# Patient Record
Sex: Male | Born: 1955 | Race: White | Hispanic: No | Marital: Married | Smoking: Never smoker
Health system: Southern US, Community
[De-identification: ages and names within clinical notes are randomized; demographics above are authoritative.]

## PROBLEM LIST (undated history)

## (undated) DIAGNOSIS — R0602 Shortness of breath: Secondary | ICD-10-CM

## (undated) DIAGNOSIS — R61 Generalized hyperhidrosis: Secondary | ICD-10-CM

## (undated) DIAGNOSIS — R0789 Other chest pain: Secondary | ICD-10-CM

## (undated) DIAGNOSIS — E669 Obesity, unspecified: Secondary | ICD-10-CM

## (undated) DIAGNOSIS — R609 Edema, unspecified: Secondary | ICD-10-CM

## (undated) DIAGNOSIS — I517 Cardiomegaly: Secondary | ICD-10-CM

## (undated) DIAGNOSIS — I10 Essential (primary) hypertension: Secondary | ICD-10-CM

## (undated) DIAGNOSIS — L0390 Cellulitis, unspecified: Secondary | ICD-10-CM

## (undated) DIAGNOSIS — Z87442 Personal history of urinary calculi: Secondary | ICD-10-CM

## (undated) DIAGNOSIS — Z973 Presence of spectacles and contact lenses: Secondary | ICD-10-CM

## (undated) DIAGNOSIS — K219 Gastro-esophageal reflux disease without esophagitis: Secondary | ICD-10-CM

## (undated) DIAGNOSIS — G4730 Sleep apnea, unspecified: Secondary | ICD-10-CM

## (undated) DIAGNOSIS — M1990 Unspecified osteoarthritis, unspecified site: Secondary | ICD-10-CM

## (undated) DIAGNOSIS — T4145XA Adverse effect of unspecified anesthetic, initial encounter: Secondary | ICD-10-CM

## (undated) DIAGNOSIS — T8859XA Other complications of anesthesia, initial encounter: Secondary | ICD-10-CM

## (undated) DIAGNOSIS — J302 Other seasonal allergic rhinitis: Secondary | ICD-10-CM

## (undated) DIAGNOSIS — K635 Polyp of colon: Secondary | ICD-10-CM

## (undated) DIAGNOSIS — K649 Unspecified hemorrhoids: Secondary | ICD-10-CM

## (undated) HISTORY — DX: Gastro-esophageal reflux disease without esophagitis: K21.9

## (undated) HISTORY — PX: COLONOSCOPY W/ BIOPSIES AND POLYPECTOMY: SHX1376

## (undated) HISTORY — DX: Cellulitis, unspecified: L03.90

## (undated) HISTORY — DX: Edema, unspecified: R60.9

## (undated) HISTORY — DX: Generalized hyperhidrosis: R61

## (undated) HISTORY — DX: Shortness of breath: R06.02

## (undated) HISTORY — DX: Cardiomegaly: I51.7

## (undated) HISTORY — DX: Obesity, unspecified: E66.9

## (undated) HISTORY — DX: Other chest pain: R07.89

---

## 1997-10-24 ENCOUNTER — Emergency Department (HOSPITAL_COMMUNITY): Admission: EM | Admit: 1997-10-24 | Discharge: 1997-10-24 | Payer: Self-pay | Admitting: Emergency Medicine

## 1997-12-09 ENCOUNTER — Emergency Department (HOSPITAL_COMMUNITY): Admission: EM | Admit: 1997-12-09 | Discharge: 1997-12-10 | Payer: Self-pay | Admitting: Emergency Medicine

## 1998-08-14 ENCOUNTER — Observation Stay (HOSPITAL_COMMUNITY): Admission: EM | Admit: 1998-08-14 | Discharge: 1998-08-15 | Payer: Self-pay | Admitting: Emergency Medicine

## 1998-08-14 ENCOUNTER — Encounter: Payer: Self-pay | Admitting: Emergency Medicine

## 1999-06-10 ENCOUNTER — Emergency Department (HOSPITAL_COMMUNITY): Admission: EM | Admit: 1999-06-10 | Discharge: 1999-06-11 | Payer: Self-pay | Admitting: Emergency Medicine

## 2000-05-22 ENCOUNTER — Encounter: Payer: Self-pay | Admitting: Internal Medicine

## 2000-05-22 ENCOUNTER — Emergency Department (HOSPITAL_COMMUNITY): Admission: EM | Admit: 2000-05-22 | Discharge: 2000-05-22 | Payer: Self-pay | Admitting: Internal Medicine

## 2002-05-10 HISTORY — PX: CARPAL TUNNEL RELEASE: SHX101

## 2002-11-07 ENCOUNTER — Inpatient Hospital Stay (HOSPITAL_COMMUNITY): Admission: EM | Admit: 2002-11-07 | Discharge: 2002-11-08 | Payer: Self-pay | Admitting: Emergency Medicine

## 2002-11-07 ENCOUNTER — Encounter: Payer: Self-pay | Admitting: Emergency Medicine

## 2003-10-31 ENCOUNTER — Emergency Department (HOSPITAL_COMMUNITY): Admission: EM | Admit: 2003-10-31 | Discharge: 2003-10-31 | Payer: Self-pay | Admitting: Emergency Medicine

## 2003-11-30 ENCOUNTER — Emergency Department (HOSPITAL_COMMUNITY): Admission: EM | Admit: 2003-11-30 | Discharge: 2003-11-30 | Payer: Self-pay | Admitting: Emergency Medicine

## 2003-12-03 ENCOUNTER — Emergency Department (HOSPITAL_COMMUNITY): Admission: EM | Admit: 2003-12-03 | Discharge: 2003-12-03 | Payer: Self-pay | Admitting: Emergency Medicine

## 2004-05-01 ENCOUNTER — Emergency Department (HOSPITAL_COMMUNITY): Admission: EM | Admit: 2004-05-01 | Discharge: 2004-05-01 | Payer: Self-pay | Admitting: Emergency Medicine

## 2004-09-22 ENCOUNTER — Emergency Department (HOSPITAL_COMMUNITY): Admission: EM | Admit: 2004-09-22 | Discharge: 2004-09-22 | Payer: Self-pay | Admitting: Family Medicine

## 2004-09-23 ENCOUNTER — Encounter: Admission: RE | Admit: 2004-09-23 | Discharge: 2004-09-23 | Payer: Self-pay | Admitting: *Deleted

## 2004-09-29 ENCOUNTER — Emergency Department (HOSPITAL_COMMUNITY): Admission: EM | Admit: 2004-09-29 | Discharge: 2004-09-29 | Payer: Self-pay | Admitting: Family Medicine

## 2004-10-06 ENCOUNTER — Emergency Department (HOSPITAL_COMMUNITY): Admission: EM | Admit: 2004-10-06 | Discharge: 2004-10-06 | Payer: Self-pay | Admitting: Family Medicine

## 2005-03-20 ENCOUNTER — Emergency Department (HOSPITAL_COMMUNITY): Admission: EM | Admit: 2005-03-20 | Discharge: 2005-03-20 | Payer: Self-pay | Admitting: Family Medicine

## 2006-01-12 IMAGING — CR DG FOOT COMPLETE 3+V*L*
3 series · 3 of 3 positions shown · non-contrast
Comparison: none

CLINICAL DATA: 48-year-old male, left foot pain with medial bruising and swelling.
 LEFT FOOT, THREE VIEWS

[view not recorded (1 of 3)]
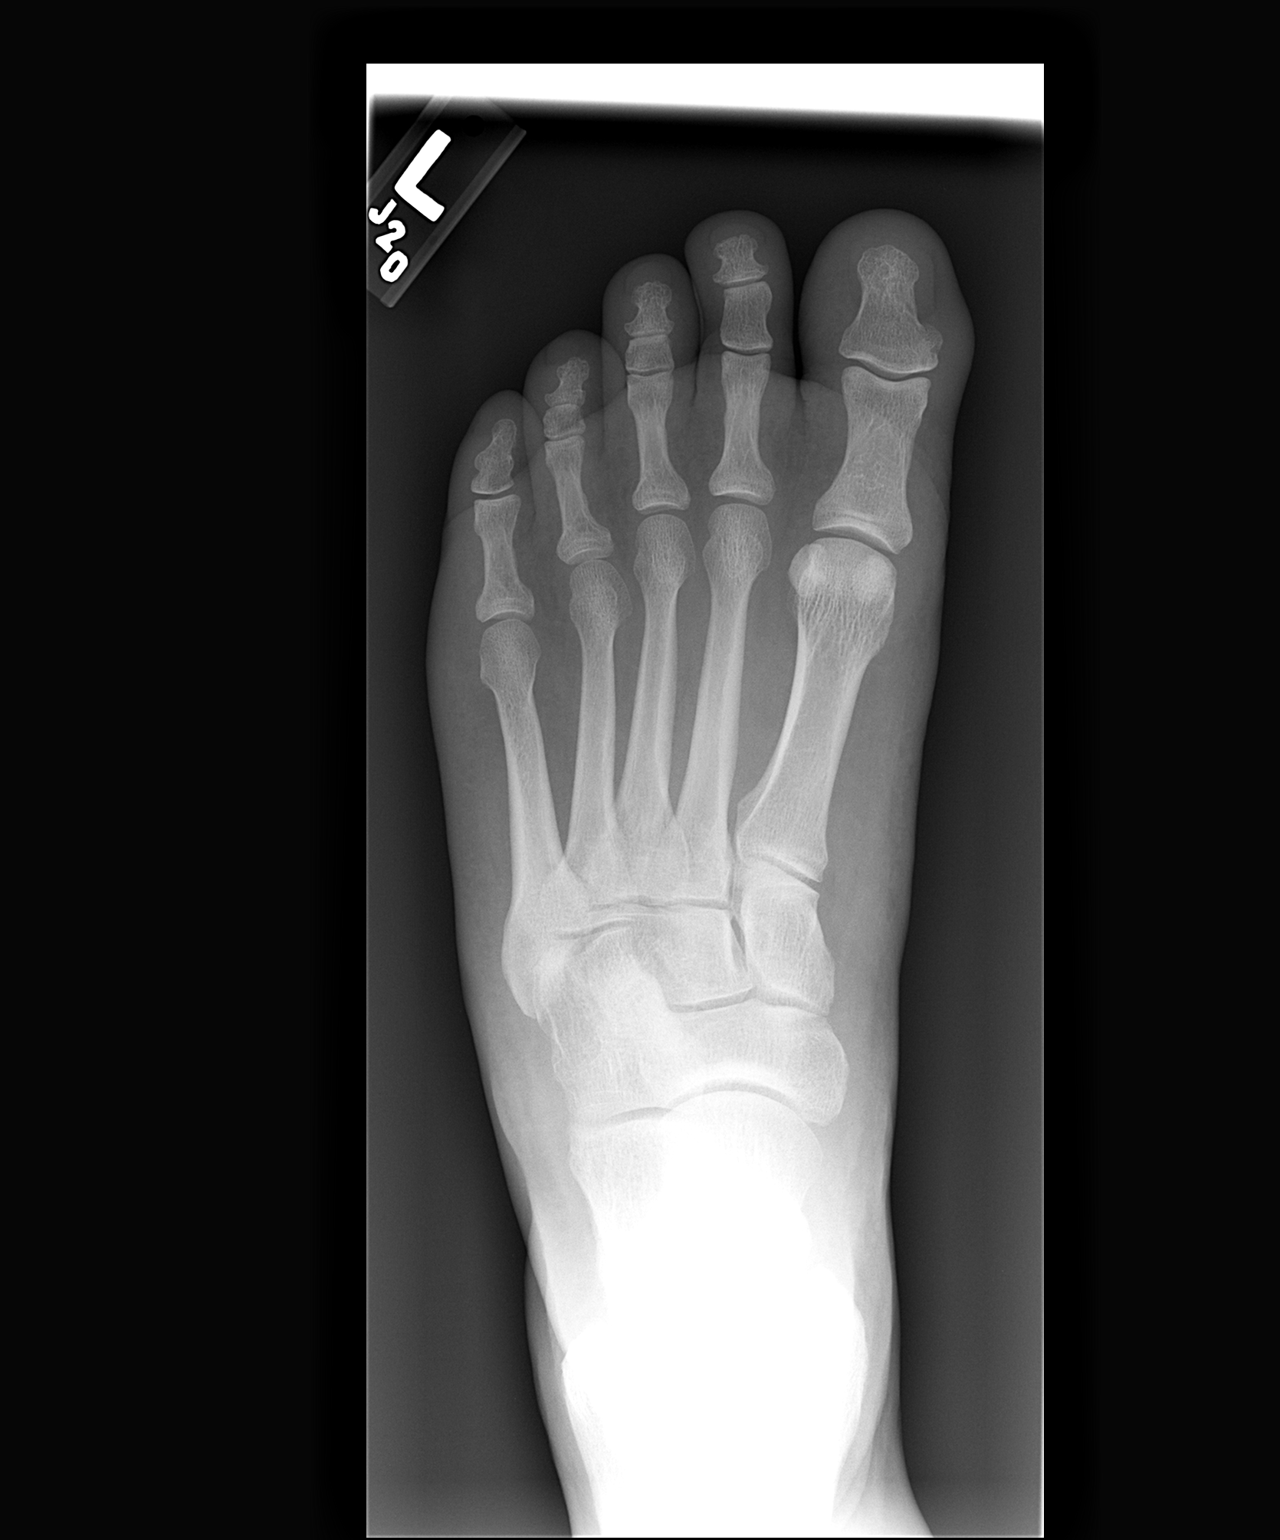

[view not recorded (2 of 3)]
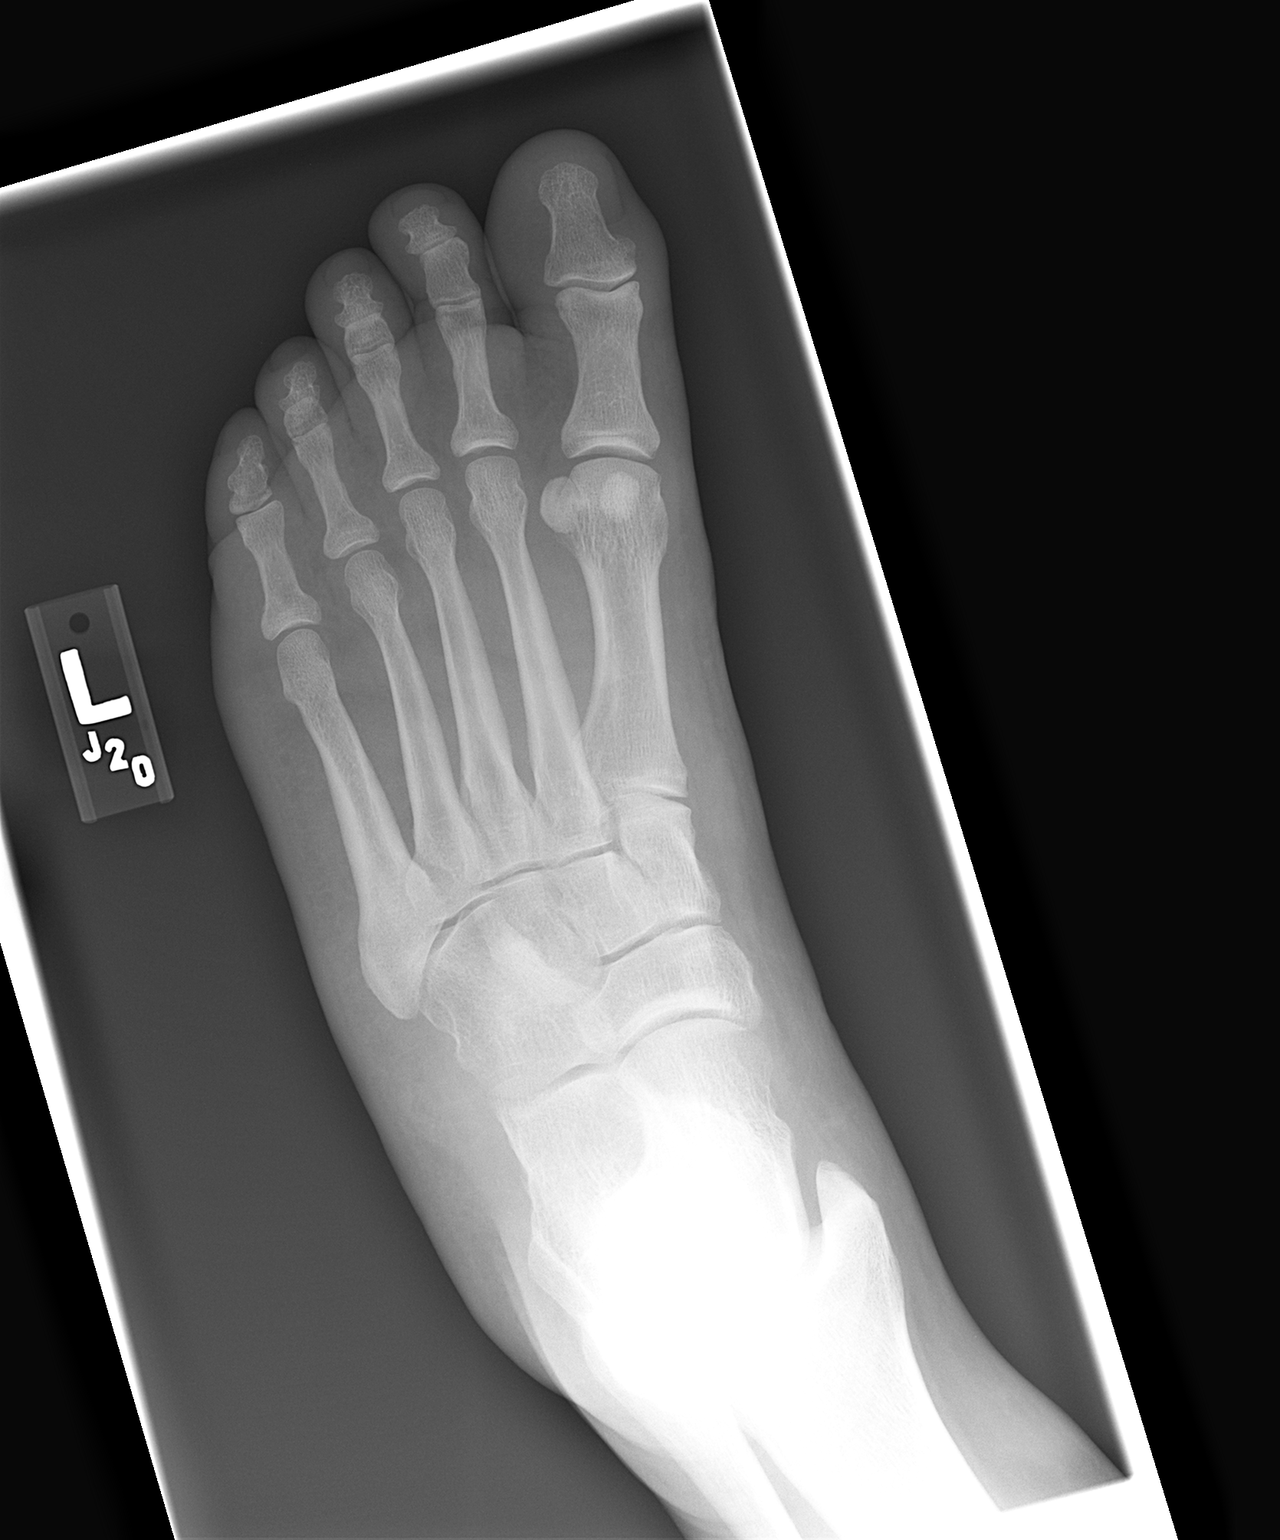

[view not recorded (3 of 3)]
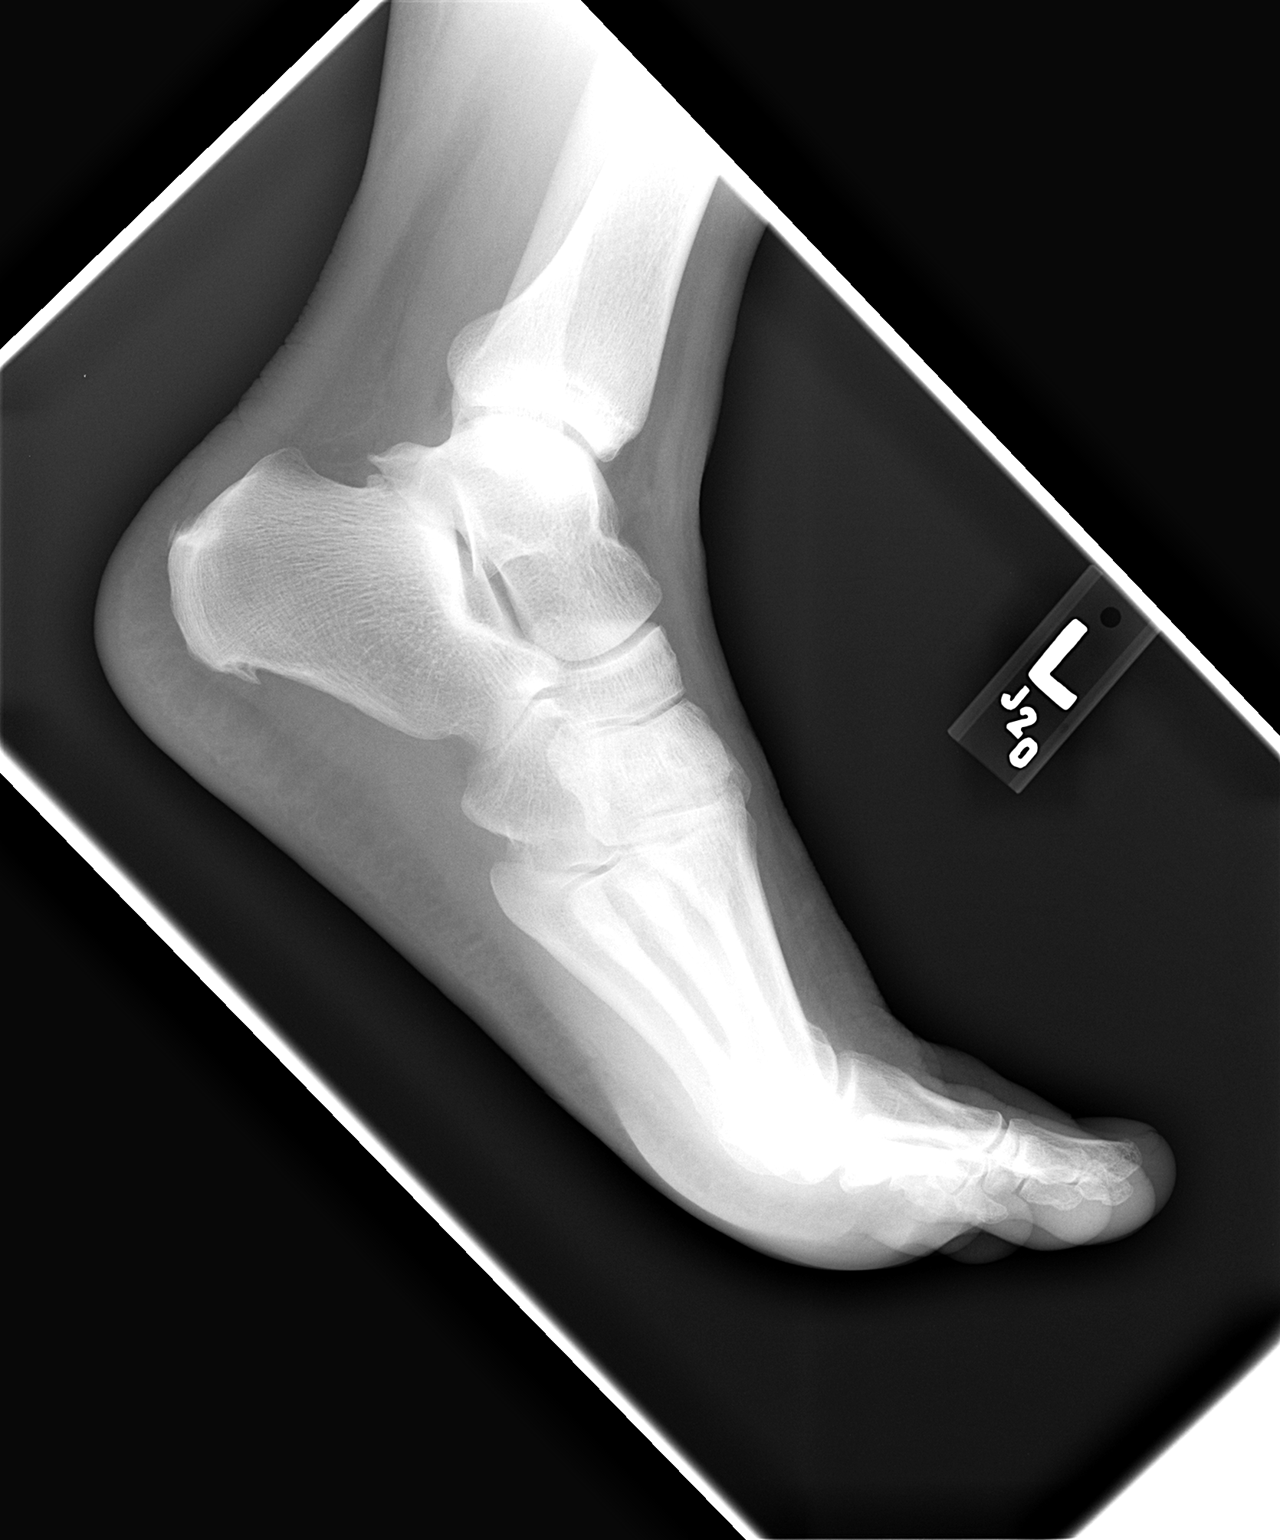

[3 of 3 positions shown; findings below may reference images not displayed]

FINDINGS: No acute radiolucent fracture or displacement.  No significant soft tissue swelling by plain radiography.  Alignment is anatomic.  Plantar calcaneal spurring is noted.  
 IMPRESSION
 No acute finding by plain radiography.

## 2008-10-23 ENCOUNTER — Emergency Department (HOSPITAL_COMMUNITY): Admission: EM | Admit: 2008-10-23 | Discharge: 2008-10-23 | Payer: Self-pay | Admitting: Emergency Medicine

## 2008-12-18 ENCOUNTER — Encounter: Payer: Self-pay | Admitting: Cardiovascular Disease

## 2010-05-10 HISTORY — PX: TRIGGER FINGER RELEASE: SHX641

## 2010-05-10 HISTORY — PX: KNEE ARTHROSCOPY: SHX127

## 2010-08-17 LAB — DIFFERENTIAL
Basophils Absolute: 0 10*3/uL (ref 0.0–0.1)
Eosinophils Absolute: 0.1 10*3/uL (ref 0.0–0.7)
Monocytes Absolute: 1.3 10*3/uL — ABNORMAL HIGH (ref 0.1–1.0)
Monocytes Relative: 16 % — ABNORMAL HIGH (ref 3–12)
Neutro Abs: 4.9 10*3/uL (ref 1.7–7.7)
Neutrophils Relative %: 61 % (ref 43–77)

## 2010-08-17 LAB — URINALYSIS, ROUTINE W REFLEX MICROSCOPIC
Bilirubin Urine: NEGATIVE
Hgb urine dipstick: NEGATIVE
Nitrite: NEGATIVE
Protein, ur: NEGATIVE mg/dL
Specific Gravity, Urine: 1.01 (ref 1.005–1.030)
pH: 6.5 (ref 5.0–8.0)

## 2010-08-17 LAB — CBC
HCT: 42.6 % (ref 39.0–52.0)
MCV: 84 fL (ref 78.0–100.0)
Platelets: 299 10*3/uL (ref 150–400)
RBC: 5.07 MIL/uL (ref 4.22–5.81)
RDW: 13.8 % (ref 11.5–15.5)

## 2010-08-17 LAB — COMPREHENSIVE METABOLIC PANEL
Albumin: 3.2 g/dL — ABNORMAL LOW (ref 3.5–5.2)
BUN: 7 mg/dL (ref 6–23)
GFR calc Af Amer: >60 mL/min (ref >60)
GFR calc non Af Amer: >60 mL/min (ref >60)
Glucose, Bld: 91 mg/dL (ref 70–99)

## 2010-08-17 LAB — POCT CARDIAC MARKERS
Myoglobin, poc: 83.7 ng/mL (ref 12–200)
Troponin i, poc: <0.05 ng/mL (ref 0.00–0.09)

## 2010-09-25 NOTE — H&P (Signed)
NAME:  Eddie Fletcher, Eddie Fletcher NO.:  [id number]   MEDICAL RECORD NO.:  [id number]                   PATIENT TYPE:  INP   LOCATION:  0102                                 FACILITY:  [facility]   PHYSICIAN:  Loretha Brasil. Lia Foyer, M.D.             DATE OF BIRTH:  1955-09-07   DATE OF ADMISSION:  11/07/2002  DATE OF DISCHARGE:                                HISTORY & PHYSICAL   CHIEF COMPLAINT:  Chest pain.   HISTORY OF PRESENT ILLNESS:  The patient is a pleasant 55 year old gentleman  who presented to the emergency room.  He had some epigastric discomfort that  awoken him from a sound sleep and it was about a 10/10.  He had something  similar to this a number of years ago and ended up having a Cardiolite  study, which according to the patient was unremarkable.  He was found at  that point to have a fatty liver and was seeing Dr. Evie Lacks. Plotnikov at  that time.   He went to work today but the pain persisted.  There was some mild  diaphoresis, dizziness, as well as some mild dyspnea.  He took some Pepto-  Bismol at the emergency room and received nitroglycerin x 2 with Morphine  sulfate with easing of the pain, although he is still having a mild degree.  The first set of enzymes in the emergency room were negative.  The patient  does have a history of gastroesophageal reflux disease and has been on  Aciphex since 1997 and he ran out on Monday on November 05, 2002 and he took  Excedrin last night for pain in the right temple.   PAST MEDICAL HISTORY:  1. Gastroesophageal reflux disease.  2. Asthmatic bronchitis.  3. Obesity.   ALLERGIES:  No known drug allergies.   MEDICATIONS:  1. Aciphex 20 mg q.d.  2. Albuterol MDI 2 puffs q.6h.  3. Advair discontinued secondary to hoarseness.   PAST SURGICAL HISTORY:  The patient is status post EGD and colonoscopy in  1997.  He is status post bilateral carpal tunnel release one and a half  years ago.   FAMILY HISTORY:  The  patient's father is currently 43 years old and in good  health.  His mother is an alcoholic and has alcohol related problems.   SOCIAL HISTORY:  The patient is married x 15 years and has two children 15  and 10 who are both well.  He works at [employer] and he has a very  vigorous busy job.  He does have some vapor exposure.  The patient does not  smoke and has never smoked and he gets secondary exposure.   REVIEW OF SYMPTOMS:  Essentially unremarkable.   PHYSICAL EXAMINATION:  GENERAL:  He is an alert and oriented male in no  acute distress.  VITAL SIGNS:  Blood pressure 129/88, pulse 83, respiratory rate 20,  temperature 97.6.  HEENT:  Unremarkable.  Pupils are equal and reactive to light and  accomodation.  NECK:  The carotid upstrokes are brisk but no carotid bruits.  LUNGS:  Clear to auscultation and percussion.  CARDIOVASCULAR:  Regular without an obvious murmur, although heart sounds  were quite distant.  ABDOMEN:  Obese and nontender with a protuberant abdomen but no obvious  hepatosplenomegaly.  EXTREMITIES:  No clubbing, cyanosis, or edema.  He had good intact pulses.   LABORATORY DATA:  The electrocardiogram demonstrates normal sinus rhythm,  essentially within normal limits.  Chest x-ray is pending.   Laboratory data includes CK total of 137 and a CK-MB of 2.9.  Troponin is  0.2.  Liver function studies reveal an SGPT that is minimally elevated at  43.  Total protein and albumin are normal.  Glucose is 109, BUN 16,  creatinine 0.8.  Potassium 3.7.  White count was 7200, hemoglobin 14.8 gm/dL  and hematocrit 43.4.   IMPRESSION:  1. Recurrent substernal epigastric discomfort following discontinuation of     Aciphex.  2. Cannot exclude unstable angina, although not likely.   RECOMMENDATIONS:  1. Resume proton pump inhibitor.  2. Serial CKs, troponins and EKGs.  3. If EKG and CPKs are negative, then would recommend exercise stress     testing.  If positive,  would recommend consideration for cardiac     catheterization.                                               Loretha Brasil. Lia Foyer, M.D.    TDS/MEDQ  D:  11/07/2002  T:  11/07/2002  Job:  [id number]   cc:   Juanita Craver, M.D.  [address]  [place]  [place] [id number]  Fax: [phone]    cc:   Juanita Craver, M.D.  [address]  [place]  [place] [address]  Fax: [phone]

## 2010-09-25 NOTE — H&P (Signed)
NAME:  Eddie Fletcher, Eddie Fletcher NO.:  [id number]   MEDICAL RECORD NO.:  [id number]                   PATIENT TYPE:  EMS   LOCATION:  ED                                   FACILITY:  [facility]   PHYSICIAN:  Eddie Fletcher, M.D.         DATE OF BIRTH:  04/20/1956   DATE OF ADMISSION:  11/07/2002  DATE OF DISCHARGE:                                HISTORY & PHYSICAL   PROBLEM LIST:  1. Chest pain, rule out myocardial infarction.     a. History of negative stress Cardiolite in 1997 at the [facility] office.  2. Gastroesophageal reflux disease associated with possible esophageal     spasms.  3. Probable acute gastritis in the setting of non-steroidal anti-     inflammatory drug use.  4. Obesity.  5. History of asthma.  6. History of seasonal rhinitis.  7. History of alcohol abuse, quit about three years ago.   CHIEF COMPLAINT:  Chest pain.   HISTORY OF PRESENT ILLNESS:  Mr. Eddie Fletcher is a pleasant 55 year old  gentleman followed at the [facility] by Dr. Tollie Fletcher.  The  patient presents to the emergency department with substernal chest  tightness.  He woke up about 4 a.m. with epigastric lower chest burning  consistent with his previous GERD symptoms.  The patient had run out of  Aciphex a few days back.  The symptoms persisted for about five hours until  he made it to the ER where the symptoms disappeared after the second  sublingual nitroglycerin.  For this reason, the patient was prepared for  admission for cardiac rule out MI.  Mr. Eddie Fletcher describes similar symptoms  as this morning though not as severe about two days while he was driving his  car.  Symptoms lasted for about 10 minutes.  He denies shortness of breath,  radiation of these chest symptoms, nausea, vomiting, and no palpitations.  No anginal symptoms.  No lower extremity swelling.  No wheezing.  No  postnasal drip.  No shortness of breath.  No headache.  No  syncope.  No  bleeding symptoms.  No nausea.  No fever, chills, nausea, vomiting,  diarrhea, constipation.  The patient describes slightly increased stress at  home.  No GU symptoms.   PAST MEDICAL HISTORY:  1. History of negative stress Cardiolite in 1997 at the [facility]     office.  2. Gastroesophageal reflux disease associated with possible esophageal     spasms.  3. Probable acute gastritis in the setting of non-steroidal anti-     inflammatory drug use.  4. Obesity.  5. History of asthma.  6. History of seasonal rhinitis.  7. History of alcohol abuse, quit about three years ago.   ALLERGIES:  None.   CURRENT MEDICATIONS:  1. Albuterol MDI p.r.n.  2. Advair, he quit last week due to sore throat.  3. Excedrin p.r.n.  4. Aciphex which he ran out of a few days ago.   SOCIAL HISTORY:  The patient is married and has three children.  He works in  Clinical cytogeneticist.  He has heavy second-hand smoking at  home.  He quit drinking about three years ago.   FAMILY HISTORY:  The patient's grandfather had hypertension and died at age  of 54, perhaps of stroke versus heart attack.  The patient's aunt had  leukemia.  No diabetes in the family.   REVIEW OF SYSTEMS:  CARDIORESPIRATORY:  He denies shortness of breath,  radiation of these chest symptoms, nausea, vomiting, and no palpitations.  No anginal symptoms.  No wheezing.  No shortness of breath.  EXTREMITIES:  No lower extremity swelling.  HEENT:  No postnasal drip.  GENERAL:  No  headache.  No syncope.  No bleeding symptoms.  No fever, chills.  GI:  No  nausea, vomiting, diarrhea, constipation.  GU:  No GU symptoms.  PSYCH:  The  patient describes slightly increased stress at home.   PHYSICAL EXAMINATION:  VITAL SIGNS:  Temperature 97.6, blood pressure  129/88, heart rate 23, respiratory rate 20, O2 saturation 96% on room air.  HEENT:  Normocephalic, atraumatic.  Nonicteric sclerae, conjunctivae within   normal limits.  PERRLA, EOMI.  Funduscopic exam negative for papilledema,  hemorrhages.  Moist mucous membranes.  TMs within normal limits.  Oropharynx  clear.  NECK:  Supple.  No JVD, bruits, adenopathy, or thyromegaly.  LUNGS:  Clear to auscultation bilaterally without crackles or wheezes.  CARDIAC:  Regular rhythm and rate without murmurs, rubs, or gallops.  Normal  S1and S2.  ABDOMEN:  Obese, there is a mild tenderness at the epigastrium.  No rebound.  Bowel sounds were present.  No hepatosplenomegaly.  No guarding or bruits.  No masses.  GU:  Within normal limits.  RECTAL:  Empty vault, normal sphincter tone.  Normal prostatic size.  EXTREMITIES:  No edema, clubbing, or cyanosis.  Pulses 2+ bilaterally.  NEURO:  Alert and oriented x3.  Strength 5/5 in all extremities.  DTRs 3/5  in lower extremities.  Cranial nerves II-XII intact.  Sensory intact.  Plantar reflexes downgoing bilaterally.   EKG:  Normal sinus rhythm without evidence of T-waves or ST segment changes.  No T-wave abnormality.   CHEST X-RAY:  Consistent with some chronic scarring of the right base,  otherwise negative.   LABORATORY DATA:  CK 137, MB 2.9, troponin I 0.02.  The rest of the lab  data, which includes CMP and CBC, are within normal limits.   ASSESSMENT AND PLAN:  Problem 1.  Chest pain, rule out myocardial  infarction.  Symptoms that the patient presents with are not classic of  angina symptoms.  The patient's cardiac risk factors are obesity and second-  hand smoking.  It is unclear if his grandfather died of acute myocardial  infarction at age of 54.  The lipid studies are normal.  Currently the  patient is chest pain free.  The EKG and cardiac enzymes are unremarkable.  Our plan is to admit the patient to a telemetry bed.  Serial cardiac enzymes  will be obtained.  Will provide with oxygen.  There is no evidence of bronchospasm.  There is no evidence of hypoxia or lower extremity edema.  Will use  aspirin and a beta blocker for now.  Will use as needed  nitroglycerin and if the chest pain returns, IV nitroglycerin drip could be  considered.  Will obtain Cardiology consult from [place].  Lipid profile  will be obtained.  Problem 2.  Gastroesophageal reflux disease with possible esophageal spasms.  Given the history of presentation, and the strong/severe history of  gastroesophageal reflux, it is possible that this patient is having an  exacerbation of esophagitis with esophageal spasms.  Mr. Eddie Fletcher was not  taking his proton pump inhibitor as described in the history of present  illness section.  Esophageal spasms can respond to the treatment with  calcium channel blockers and nitrates.  Will be monitoring for further  symptoms.  For now will use a proton pump inhibitor twice a day and  antireflux maneuvers.  For now will hold on the use of nitrates and calcium  channel blockers unless the symptoms return.  An esophagogastroduodenoscopy  may be helpful as an outpatient in the near future.  Problem 3.  Acute gastritis.  The patient was taking regularly non-steroidal  anti-inflammatory drugs.  There is mild epigastric tenderness.  I suspect  gastritis as described above.  I will use Protonix and Carafate for now.  As  described in problem #2, an esophagogastroduodenoscopy may be helpful in the  near future.  Will defer this procedure to be done as an outpatient unless  the symptoms were to get worse during this brief observation  status/admission.  Problem 4.  Obesity.  Will use a low-fat diet.  Will check a TSH and a lipid  profile.                                               Eddie Fletcher, M.D.    JM/MEDQ  D:  11/07/2002  T:  11/07/2002  Job:  [id number]   cc:   [facility]   Juanita Craver, M.D.  [address]  [place]  [place] [address]  Fax: [phone]

## 2010-09-29 ENCOUNTER — Other Ambulatory Visit: Payer: Self-pay | Admitting: Family Medicine

## 2010-09-29 DIAGNOSIS — M25561 Pain in right knee: Secondary | ICD-10-CM

## 2010-10-04 ENCOUNTER — Ambulatory Visit
Admission: RE | Admit: 2010-10-04 | Discharge: 2010-10-04 | Disposition: A | Discharge status: Home / Self Care | Payer: Medicare FFS | Source: Ambulatory Visit | Attending: Family Medicine | Admitting: Family Medicine

## 2010-10-04 DIAGNOSIS — M25561 Pain in right knee: Secondary | ICD-10-CM

## 2010-11-17 ENCOUNTER — Other Ambulatory Visit (HOSPITAL_COMMUNITY): Payer: Self-pay | Admitting: Orthopedic Surgery

## 2010-11-17 ENCOUNTER — Encounter (HOSPITAL_COMMUNITY)
Admission: RE | Admit: 2010-11-17 | Discharge: 2010-11-17 | Disposition: A | Discharge status: Home / Self Care | Payer: Managed Care, Other (non HMO) | Source: Ambulatory Visit | Attending: Orthopedic Surgery | Admitting: Orthopedic Surgery

## 2010-11-17 DIAGNOSIS — S8990XA Unspecified injury of unspecified lower leg, initial encounter: Secondary | ICD-10-CM

## 2010-11-17 LAB — BASIC METABOLIC PANEL
BUN: 13 mg/dL (ref 6–23)
CO2: 31 mEq/L (ref 19–32)
Calcium: 9 mg/dL (ref 8.4–10.5)
Chloride: 102 mEq/L (ref 96–112)
Creatinine, Ser: 0.75 mg/dL (ref 0.50–1.35)
Potassium: 4.3 mEq/L (ref 3.5–5.1)
Sodium: 141 mEq/L (ref 135–145)

## 2010-11-17 LAB — SURGICAL PCR SCREEN: MRSA, PCR: NEGATIVE

## 2010-11-17 LAB — CBC
HCT: 42.1 % (ref 39.0–52.0)
MCV: 84.5 fL (ref 78.0–100.0)
RDW: 14 % (ref 11.5–15.5)

## 2010-11-17 LAB — PROTIME-INR: INR: 1.09 (ref 0.00–1.49)

## 2010-11-18 ENCOUNTER — Ambulatory Visit (HOSPITAL_COMMUNITY)
Admission: RE | Admit: 2010-11-18 | Discharge: 2010-11-18 | Disposition: A | Discharge status: Home / Self Care | Payer: Managed Care, Other (non HMO) | Source: Ambulatory Visit | Attending: Orthopedic Surgery | Admitting: Orthopedic Surgery

## 2010-11-18 DIAGNOSIS — I10 Essential (primary) hypertension: Secondary | ICD-10-CM | POA: Insufficient documentation

## 2010-11-18 DIAGNOSIS — IMO0002 Reserved for concepts with insufficient information to code with codable children: Secondary | ICD-10-CM | POA: Insufficient documentation

## 2010-11-18 DIAGNOSIS — Z01818 Encounter for other preprocedural examination: Secondary | ICD-10-CM | POA: Insufficient documentation

## 2010-11-18 DIAGNOSIS — Z01812 Encounter for preprocedural laboratory examination: Secondary | ICD-10-CM | POA: Insufficient documentation

## 2010-11-18 DIAGNOSIS — Z01810 Encounter for preprocedural cardiovascular examination: Secondary | ICD-10-CM | POA: Insufficient documentation

## 2010-11-18 DIAGNOSIS — E669 Obesity, unspecified: Secondary | ICD-10-CM | POA: Insufficient documentation

## 2010-11-18 DIAGNOSIS — X58XXXA Exposure to other specified factors, initial encounter: Secondary | ICD-10-CM | POA: Insufficient documentation

## 2010-11-18 DIAGNOSIS — M6580 Other synovitis and tenosynovitis, unspecified site: Secondary | ICD-10-CM | POA: Insufficient documentation

## 2010-11-18 DIAGNOSIS — M1710 Unilateral primary osteoarthritis, unspecified knee: Secondary | ICD-10-CM | POA: Insufficient documentation

## 2010-11-18 DIAGNOSIS — G4733 Obstructive sleep apnea (adult) (pediatric): Secondary | ICD-10-CM | POA: Insufficient documentation

## 2010-12-09 NOTE — Op Note (Signed)
Eddie Fletcher, Eddie Fletcher NO.:  [id number]  MEDICAL RECORD NO.:  [id number]  LOCATION:  SDSC                         FACILITY:  [facility]  PHYSICIAN:  Newt Minion, MD     DATE OF BIRTH:  [birth year]  DATE OF PROCEDURE:  11/18/2010 DATE OF DISCHARGE:  11/18/2010                              OPERATIVE REPORT   PREOPERATIVE DIAGNOSIS:  Osteoarthritis and meniscal tear, right knee.  POSTOPERATIVE DIAGNOSES: 1. Osteochondral defect, medial femoral condyle, lateral femoral     condyle, patella and trochlea. 2. Medial and lateral meniscal tears. 3. Loose body greater than 10 mm in diameter.  PROCEDURE: 1. Right knee arthroscopy. 2. Partial medial lateral meniscectomy. 3. Abrasion chondroplasty, medial femoral condyle, lateral femoral     condyle, patella, and trochlea. 4. Removal of loose body greater than 10 mm in diameter.  SURGEON:  Newt Minion, MD  ANESTHESIA:  General plus knee block.  ESTIMATED BLOOD LOSS:  Minimal.  ANTIBIOTICS:  Kefzol 2 grams.  DRAINS:  None.  COMPLICATIONS:  None.  DISPOSITION:  To PACU in stable condition.  INDICATION FOR PROCEDURE:  The patient is a 55 year old gentleman who has mechanical symptoms of his right knee.  He has failed conservative care, has pain with activities of daily living and presents at this time for surgical intervention.  Risks and benefits of surgery were discussed including infection, neurovascular injury, persistent pain, need for additional surgery.  The patient states he understands and wished to proceed at this time.  DESCRIPTION OF PROCEDURE:  The patient was brought to OR room 10 and underwent a general anesthetic after knee block.  After adequate level of anesthesia was obtained, the patient's right lower extremity was prepped using DuraPrep and draped into a sterile field.  The scope was inserted through the inferolateral portal and inferior medial working portal was established.   Visualization showed a large osteochondral defect of the medial femoral condyle.  This was dbrided back to bleeding viable petechial bone.  He had a large posterior horn of the medial meniscal tear.  This was dbrided.  Examination of notch showed an intact ACL.  Examination of lateral joint line in the figure of four position showed a osteochondral defect of the lateral femoral condyle which was debrided as well as a large degenerative tear of the lateral meniscus.  This was also dbrided.  There was a large loose body in the lateral joint line which measured 10 mm in diameter and this was removed without complications.  Examination of the patellofemoral joint showed large osteochondral defect of patellofemoral joint.  This was dbrided. There was a plica which was dbrided and synovitis which was dbrided. There were no loose bodies in the lateral gutter.  Survey of all compartments again showed no further loose bodies.  The instruments were removed.  The joint was infused with a total of 20 mL of 0.5% Marcaine plain.  The wound was covered with a Adaptic, 4x4s, Webril.  The patient had significant venous stasis edema and dermatitis in the right lower extremity.  Unna paste, Kerlix, and a Coban wrap was applied to control the venous stasis edema.  Plan to follow up in the  office in 1 week.     Newt Minion, MD     MVD/MEDQ  D:  11/18/2010  T:  11/18/2010  Job:  [id number]  Electronically Signed by Meridee Score MD on 12/09/2010 06:27:19 AM

## 2014-01-16 ENCOUNTER — Other Ambulatory Visit (HOSPITAL_COMMUNITY): Payer: Self-pay | Admitting: Orthopedic Surgery

## 2014-01-23 ENCOUNTER — Encounter (HOSPITAL_COMMUNITY)
Admission: RE | Admit: 2014-01-23 | Discharge: 2014-01-23 | Disposition: A | Discharge status: Home / Self Care | Payer: Managed Care, Other (non HMO) | Source: Ambulatory Visit | Attending: Orthopedic Surgery | Admitting: Orthopedic Surgery

## 2014-01-23 ENCOUNTER — Encounter (HOSPITAL_COMMUNITY): Payer: Self-pay

## 2014-01-23 ENCOUNTER — Ambulatory Visit (HOSPITAL_COMMUNITY)
Admission: RE | Admit: 2014-01-23 | Discharge: 2014-01-23 | Disposition: A | Discharge status: Home / Self Care | Payer: Managed Care, Other (non HMO) | Source: Ambulatory Visit | Attending: Orthopedic Surgery | Admitting: Orthopedic Surgery

## 2014-01-23 DIAGNOSIS — Z01818 Encounter for other preprocedural examination: Secondary | ICD-10-CM | POA: Insufficient documentation

## 2014-01-23 DIAGNOSIS — G4730 Sleep apnea, unspecified: Secondary | ICD-10-CM | POA: Insufficient documentation

## 2014-01-23 DIAGNOSIS — I10 Essential (primary) hypertension: Secondary | ICD-10-CM | POA: Diagnosis not present

## 2014-01-23 DIAGNOSIS — E669 Obesity, unspecified: Secondary | ICD-10-CM | POA: Diagnosis not present

## 2014-01-23 HISTORY — DX: Personal history of urinary calculi: Z87.442

## 2014-01-23 HISTORY — DX: Unspecified osteoarthritis, unspecified site: M19.90

## 2014-01-23 HISTORY — DX: Essential (primary) hypertension: I10

## 2014-01-23 HISTORY — DX: Sleep apnea, unspecified: G47.30

## 2014-01-23 LAB — CBC
HCT: 36.6 % — ABNORMAL LOW (ref 39.0–52.0)
Hemoglobin: 12.4 g/dL — ABNORMAL LOW (ref 13.0–17.0)
MCH: 29.2 pg (ref 26.0–34.0)
MCHC: 33.9 g/dL (ref 30.0–36.0)
MCV: 86.1 fL (ref 78.0–100.0)
Platelets: 216 10*3/uL (ref 150–400)
RBC: 4.25 MIL/uL (ref 4.22–5.81)
RDW: 13.9 % (ref 11.5–15.5)
WBC: 7.2 10*3/uL (ref 4.0–10.5)

## 2014-01-23 LAB — COMPREHENSIVE METABOLIC PANEL
ALT: 22 U/L (ref 0–53)
AST: 20 U/L (ref 0–37)
Albumin: 3.1 g/dL — ABNORMAL LOW (ref 3.5–5.2)
Alkaline Phosphatase: 118 U/L — ABNORMAL HIGH (ref 39–117)
Anion gap: 9 (ref 5–15)
BUN: 23 mg/dL (ref 6–23)
CO2: 28 meq/L (ref 19–32)
CREATININE: 1.09 mg/dL (ref 0.50–1.35)
Calcium: 9 mg/dL (ref 8.4–10.5)
Chloride: 97 mEq/L (ref 96–112)
GFR calc Af Amer: 85 mL/min — ABNORMAL LOW (ref >90)
GFR, EST NON AFRICAN AMERICAN: 74 mL/min — AB (ref >90)
GLUCOSE: 99 mg/dL (ref 70–99)
Potassium: 4.1 mEq/L (ref 3.7–5.3)
SODIUM: 134 meq/L — AB (ref 137–147)
Total Bilirubin: 0.4 mg/dL (ref 0.3–1.2)
Total Protein: 8.3 g/dL (ref 6.0–8.3)

## 2014-01-23 LAB — TYPE AND SCREEN
ABO/RH(D): O POS
Antibody Screen: NEGATIVE

## 2014-01-23 LAB — PROTIME-INR
INR: 1.07 (ref 0.00–1.49)
Prothrombin Time: 13.9 seconds (ref 11.6–15.2)

## 2014-01-23 LAB — ABO/RH: ABO/RH(D): O POS

## 2014-01-23 LAB — APTT: aPTT: 29 seconds (ref 24–37)

## 2014-01-23 LAB — SURGICAL PCR SCREEN
MRSA, PCR: NEGATIVE
Staphylococcus aureus: POSITIVE — AB

## 2014-01-23 NOTE — Pre-Procedure Instructions (Addendum)
Eddie Fletcher  01/23/2014   Your procedure is scheduled on:  01/30/14  Report to [facility] short stay admitting at 75 AM.  Call this number if you have problems the morning of surgery: [phone]   Remember:   Do not eat food or drink liquids after midnight.   Take these medicines the morning of surgery with A SIP OF WATER: inhaler,     Take all meds as ordered until day of surgery except as instructed below or per dr  Eddie Fletcher all herbel meds, nsaids (aleve,naproxen,advil,ibuprofen) 5 days prior to surgery(01/25/14) including vitamins, aspirin   Do not wear jewelry, make-up or nail polish.  Do not wear lotions, powders, or perfumes. You may wear deodorant.  Do not shave 48 hours prior to surgery. Men may shave face and neck.  Do not bring valuables to the hospital.  [facility] is not responsible                  for any belongings or valuables.               Contacts, dentures or bridgework may not be worn into surgery.  Leave suitcase in the car. After surgery it may be brought to your room.  For patients admitted to the hospital, discharge time is determined by your                treatment team.               Patients discharged the day of surgery will not be allowed to drive  home.  Name and phone number of your driver:   Special Instructions:  Special Instructions: Eddie Fletcher - Preparing for Surgery  Before surgery, you can play an important role.  Because skin is not sterile, your skin needs to be as free of germs as possible.  You can reduce the number of germs on you skin by washing with CHG (chlorahexidine gluconate) soap before surgery.  CHG is an antiseptic cleaner which kills germs and bonds with the skin to continue killing germs even after washing.  Please DO NOT use if you have an allergy to CHG or antibacterial soaps.  If your skin becomes reddened/irritated stop using the CHG and inform your nurse when you arrive at Short Stay.  Do not shave (including legs and  underarms) for at least 48 hours prior to the first CHG shower.  You may shave your face.  Please follow these instructions carefully:   1.  Shower with CHG Soap the night before surgery and the morning of Surgery.  2.  If you choose to wash your hair, wash your hair first as usual with your normal shampoo.  3.  After you shampoo, rinse your hair and body thoroughly to remove the Shampoo.  4.  Use CHG as you would any other liquid soap.  You can apply chg directly  to the skin and wash gently with scrungie or a clean washcloth.  5.  Apply the CHG Soap to your body ONLY FROM THE NECK DOWN.  Do not use on open wounds or open sores.  Avoid contact with your eyes ears, mouth and genitals (private parts).  Wash genitals (private parts)       with your normal soap.  6.  Wash thoroughly, paying special attention to the area where your surgery will be performed.  7.  Thoroughly rinse your body with warm water from the neck down.  8.  DO NOT shower/wash  with your normal soap after using and rinsing off the CHG Soap.  9.  Pat yourself dry with a clean towel.            10.  Wear clean pajamas.            11.  Place clean sheets on your bed the night of your first shower and do not sleep with pets.  Day of Surgery  Do not apply any lotions/deodorants the morning of surgery.  Please wear clean clothes to the hospital/surgery center.   Please read over the following fact sheets that you were given: Pain Booklet, Coughing and Deep Breathing, Blood Transfusion Information, Total Joint Packet, MRSA Information and Surgical Site Infection Prevention

## 2014-01-23 NOTE — Progress Notes (Signed)
Mupirocin Ointment Rx called into [facility] in [facility] for positive PCR of staph. Left message on pt's voicemail notifying him of results.

## 2014-01-24 NOTE — Progress Notes (Addendum)
Anesthesia Chart Review:  Pt is 58 year old male scheduled for R total knee arthroplasty on 01/30/14 with Dr. Sharol Given.   PMH: HTN, cardiomegaly, SOB occasionally with exertion, OSA. BMI is 47.   Medications include: Hctz, lisinopril, albuterol, flomax.   BP 92/31, SPO2 93%  Preoperative labs reviewed.   Chest x-ray reviewed. New bilateral diffuse reticulonodular pulmonary opacities. This is a nonspecific finding and could indicate variant of interstitial edema although viral/atypical infection could appear similar. The patient is reportedly asymptomatic. Further non upper urgent evaluation with chest CT without contrast may be helpful for further characterization. -per radiologist's notes, results were to be called to [place].   Discussed cxr with Dr. Tobias Alexander.   EKG: NSR.   If no changes, I anticipate pt can proceed with surgery as scheduled.   Willeen Cass, FNP-BC [facility] Short Stay Surgical Center/Anesthesiology Phone: [phone] 01/24/2014 4:03 PM   Addendum:  Jonah Blue, FNP-BC had left patient a message to call. I spoke with him this afternoon.  He said chest pain history was 15 years ago. He reported negative testing then and symptoms were attributed to stress.  He denies any recent chest pain.  He has chronic exertional dyspnea with activities such as walking up an incline.  He does okay on flat surfaces.  He has had no changes in his symptoms in > 1 year. He denies fever, cough.  I did review that he had non-specific findings on CXR and to follow-up with Dr. Sharol Given if he was planning to order a chest CT or defer recommendations to his PCP.  I told him that without acute respiratory symptoms, CXR findings should not delay his surgery.  Echo on 12/18/08 showed (see CV Procedures tab): Normal LV systolic function, mild LVH, trace mitral and tricuspid regurgitation.   George [facility] Short Stay Center/Anesthesiology Phone [phone] 01/25/2014 4:01 PM

## 2014-01-25 ENCOUNTER — Encounter (HOSPITAL_COMMUNITY): Payer: Self-pay | Admitting: Vascular Surgery

## 2014-01-29 MED ORDER — DEXTROSE 5 % IV SOLN
3.0000 g | INTRAVENOUS | Status: AC
  Administered 2014-01-30: 3 g via INTRAVENOUS
  Filled 2014-01-29: qty 3000

## 2014-01-30 ENCOUNTER — Encounter (HOSPITAL_COMMUNITY): Payer: Managed Care, Other (non HMO) | Admitting: Emergency Medicine

## 2014-01-30 ENCOUNTER — Ambulatory Visit (HOSPITAL_COMMUNITY): Payer: Managed Care, Other (non HMO) | Admitting: Vascular Surgery

## 2014-01-30 ENCOUNTER — Encounter (HOSPITAL_COMMUNITY)
Admission: RE | Disposition: A | Discharge status: Skilled Nursing Facility | Payer: Self-pay | Source: Ambulatory Visit | Attending: Orthopedic Surgery

## 2014-01-30 ENCOUNTER — Inpatient Hospital Stay (HOSPITAL_COMMUNITY)
Admission: RE | Admit: 2014-01-30 | Discharge: 2014-02-01 | DRG: 470 | Disposition: A | Discharge status: Skilled Nursing Facility | Payer: Managed Care, Other (non HMO) | Source: Ambulatory Visit | Attending: Orthopedic Surgery | Admitting: Orthopedic Surgery

## 2014-01-30 DIAGNOSIS — M1710 Unilateral primary osteoarthritis, unspecified knee: Principal | ICD-10-CM | POA: Diagnosis present

## 2014-01-30 DIAGNOSIS — Z96651 Presence of right artificial knee joint: Secondary | ICD-10-CM

## 2014-01-30 DIAGNOSIS — E669 Obesity, unspecified: Secondary | ICD-10-CM | POA: Diagnosis present

## 2014-01-30 DIAGNOSIS — Z87442 Personal history of urinary calculi: Secondary | ICD-10-CM | POA: Diagnosis not present

## 2014-01-30 DIAGNOSIS — Z79899 Other long term (current) drug therapy: Secondary | ICD-10-CM

## 2014-01-30 DIAGNOSIS — K219 Gastro-esophageal reflux disease without esophagitis: Secondary | ICD-10-CM | POA: Diagnosis present

## 2014-01-30 DIAGNOSIS — Z882 Allergy status to sulfonamides status: Secondary | ICD-10-CM | POA: Diagnosis not present

## 2014-01-30 DIAGNOSIS — I10 Essential (primary) hypertension: Secondary | ICD-10-CM | POA: Diagnosis present

## 2014-01-30 DIAGNOSIS — Z7982 Long term (current) use of aspirin: Secondary | ICD-10-CM | POA: Diagnosis not present

## 2014-01-30 DIAGNOSIS — M8708 Idiopathic aseptic necrosis of bone, other site: Secondary | ICD-10-CM | POA: Diagnosis present

## 2014-01-30 DIAGNOSIS — Z791 Long term (current) use of non-steroidal anti-inflammatories (NSAID): Secondary | ICD-10-CM | POA: Diagnosis not present

## 2014-01-30 DIAGNOSIS — Z881 Allergy status to other antibiotic agents status: Secondary | ICD-10-CM

## 2014-01-30 DIAGNOSIS — Z96659 Presence of unspecified artificial knee joint: Secondary | ICD-10-CM

## 2014-01-30 DIAGNOSIS — M25569 Pain in unspecified knee: Secondary | ICD-10-CM | POA: Diagnosis present

## 2014-01-30 DIAGNOSIS — Z6841 Body Mass Index (BMI) 40.0 and over, adult: Secondary | ICD-10-CM | POA: Diagnosis not present

## 2014-01-30 HISTORY — PX: TOTAL KNEE ARTHROPLASTY: SHX125

## 2014-01-30 SURGERY — ARTHROPLASTY, KNEE, TOTAL
Anesthesia: General | Site: Knee | Laterality: Right

## 2014-01-30 MED ORDER — LISINOPRIL 20 MG PO TABS
20.0000 mg | ORAL_TABLET | Freq: Every day | ORAL | Status: DC
  Administered 2014-01-30: 20 mg via ORAL
  Filled 2014-01-30 (×3): qty 1

## 2014-01-30 MED ORDER — SODIUM CHLORIDE 0.9 % IJ SOLN
INTRAMUSCULAR | Status: DC | PRN
  Administered 2014-01-30: 40 mL

## 2014-01-30 MED ORDER — MEPERIDINE HCL 25 MG/ML IJ SOLN: 6.2500 mg | INTRAMUSCULAR | Status: DC | PRN

## 2014-01-30 MED ORDER — DIPHENHYDRAMINE HCL 12.5 MG/5ML PO ELIX: 12.5000 mg | ORAL_SOLUTION | ORAL | Status: DC | PRN

## 2014-01-30 MED ORDER — SODIUM CHLORIDE 0.9 % IR SOLN
Status: DC | PRN
  Administered 2014-01-30: 3000 mL

## 2014-01-30 MED ORDER — BISACODYL 5 MG PO TBEC
5.0000 mg | DELAYED_RELEASE_TABLET | Freq: Every day | ORAL | Status: DC | PRN
Start: 2014-01-30 — End: 2014-02-01

## 2014-01-30 MED ORDER — SODIUM CHLORIDE 0.9 % IV SOLN
INTRAVENOUS | Status: DC | PRN
  Administered 2014-01-30: 12:00:00 via INTRAVENOUS

## 2014-01-30 MED ORDER — PROMETHAZINE HCL 25 MG/ML IJ SOLN: 6.2500 mg | INTRAMUSCULAR | Status: DC | PRN

## 2014-01-30 MED ORDER — ONDANSETRON HCL 4 MG/2ML IJ SOLN
INTRAMUSCULAR | Status: DC | PRN
  Administered 2014-01-30: 4 mg via INTRAVENOUS

## 2014-01-30 MED ORDER — BUPIVACAINE-EPINEPHRINE (PF) 0.5% -1:200000 IJ SOLN
INTRAMUSCULAR | Status: DC | PRN
  Administered 2014-01-30: 30 mL

## 2014-01-30 MED ORDER — HYDROMORPHONE HCL 1 MG/ML IJ SOLN
INTRAMUSCULAR | Status: AC
  Filled 2014-01-30: qty 1

## 2014-01-30 MED ORDER — TAMSULOSIN HCL 0.4 MG PO CAPS
0.4000 mg | ORAL_CAPSULE | Freq: Every day | ORAL | Status: DC
  Administered 2014-01-30 – 2014-01-31 (×2): 0.4 mg via ORAL
  Filled 2014-01-30 (×3): qty 1

## 2014-01-30 MED ORDER — FENTANYL CITRATE 0.05 MG/ML IJ SOLN
100.0000 ug | Freq: Once | INTRAMUSCULAR | Status: AC
  Administered 2014-01-30: 100 ug via INTRAVENOUS

## 2014-01-30 MED ORDER — PROPOFOL 10 MG/ML IV BOLUS
INTRAVENOUS | Status: AC
  Filled 2014-01-30: qty 20

## 2014-01-30 MED ORDER — PHENOL 1.4 % MT LIQD: 1.0000 | OROMUCOSAL | Status: DC | PRN

## 2014-01-30 MED ORDER — LIDOCAINE HCL (CARDIAC) 20 MG/ML IV SOLN
INTRAVENOUS | Status: DC | PRN
  Administered 2014-01-30: 100 mg via INTRAVENOUS

## 2014-01-30 MED ORDER — METOCLOPRAMIDE HCL 5 MG/ML IJ SOLN: 5.0000 mg | Freq: Three times a day (TID) | INTRAMUSCULAR | Status: DC | PRN

## 2014-01-30 MED ORDER — BUPIVACAINE LIPOSOME 1.3 % IJ SUSP
20.0000 mL | INTRAMUSCULAR | Status: AC
  Administered 2014-01-30: 20 mL
  Filled 2014-01-30: qty 20

## 2014-01-30 MED ORDER — ASPIRIN EC 325 MG PO TBEC
325.0000 mg | DELAYED_RELEASE_TABLET | Freq: Every day | ORAL | Status: DC
  Administered 2014-01-31 – 2014-02-01 (×2): 325 mg via ORAL
  Filled 2014-01-30 (×3): qty 1

## 2014-01-30 MED ORDER — DOCUSATE SODIUM 100 MG PO CAPS
100.0000 mg | ORAL_CAPSULE | Freq: Two times a day (BID) | ORAL | Status: DC
  Administered 2014-01-30 – 2014-02-01 (×4): 100 mg via ORAL
  Filled 2014-01-30 (×4): qty 1

## 2014-01-30 MED ORDER — SUCCINYLCHOLINE CHLORIDE 20 MG/ML IJ SOLN
INTRAMUSCULAR | Status: DC | PRN
  Administered 2014-01-30: 100 mg via INTRAVENOUS

## 2014-01-30 MED ORDER — METOCLOPRAMIDE HCL 10 MG PO TABS: 5.0000 mg | ORAL_TABLET | Freq: Three times a day (TID) | ORAL | Status: DC | PRN

## 2014-01-30 MED ORDER — HYDROMORPHONE HCL 1 MG/ML IJ SOLN
1.0000 mg | INTRAMUSCULAR | Status: DC | PRN
  Administered 2014-01-31 (×3): 1 mg via INTRAVENOUS
  Filled 2014-01-30 (×3): qty 1

## 2014-01-30 MED ORDER — SODIUM CHLORIDE 0.9 % IV SOLN
INTRAVENOUS | Status: DC
  Administered 2014-01-31 – 2014-02-01 (×2): via INTRAVENOUS

## 2014-01-30 MED ORDER — PHENYLEPHRINE HCL 10 MG/ML IJ SOLN
INTRAMUSCULAR | Status: DC | PRN
  Administered 2014-01-30 (×2): 40 ug via INTRAVENOUS
  Administered 2014-01-30 (×2): 80 ug via INTRAVENOUS

## 2014-01-30 MED ORDER — ALBUTEROL SULFATE HFA 108 (90 BASE) MCG/ACT IN AERS: 1.0000 | INHALATION_SPRAY | Freq: Four times a day (QID) | RESPIRATORY_TRACT | Status: DC | PRN

## 2014-01-30 MED ORDER — MIDAZOLAM HCL 5 MG/ML IJ SOLN
2.0000 mg | Freq: Once | INTRAMUSCULAR | Status: AC
  Administered 2014-01-30: 2 mg via INTRAVENOUS

## 2014-01-30 MED ORDER — METHOCARBAMOL 500 MG PO TABS
500.0000 mg | ORAL_TABLET | Freq: Four times a day (QID) | ORAL | Status: DC | PRN
  Administered 2014-01-30 – 2014-02-01 (×4): 500 mg via ORAL
  Filled 2014-01-30 (×4): qty 1

## 2014-01-30 MED ORDER — ACETAMINOPHEN 325 MG PO TABS
650.0000 mg | ORAL_TABLET | Freq: Four times a day (QID) | ORAL | Status: DC | PRN
  Filled 2014-01-30: qty 2

## 2014-01-30 MED ORDER — LACTATED RINGERS IV SOLN
INTRAVENOUS | Status: DC
  Administered 2014-01-30: 09:00:00 via INTRAVENOUS

## 2014-01-30 MED ORDER — DEXTROSE 5 % IV SOLN
500.0000 mg | Freq: Four times a day (QID) | INTRAVENOUS | Status: DC | PRN
  Filled 2014-01-30: qty 5

## 2014-01-30 MED ORDER — LACTATED RINGERS IV SOLN
INTRAVENOUS | Status: DC | PRN
  Administered 2014-01-30 (×2): via INTRAVENOUS

## 2014-01-30 MED ORDER — FENTANYL CITRATE 0.05 MG/ML IJ SOLN
INTRAMUSCULAR | Status: DC | PRN
  Administered 2014-01-30: 50 ug via INTRAVENOUS
  Administered 2014-01-30 (×2): 100 ug via INTRAVENOUS

## 2014-01-30 MED ORDER — ONDANSETRON HCL 4 MG/2ML IJ SOLN: 4.0000 mg | Freq: Four times a day (QID) | INTRAMUSCULAR | Status: DC | PRN

## 2014-01-30 MED ORDER — LACTATED RINGERS IV SOLN: INTRAVENOUS | Status: DC

## 2014-01-30 MED ORDER — FENTANYL CITRATE 0.05 MG/ML IJ SOLN
INTRAMUSCULAR | Status: AC
  Filled 2014-01-30: qty 2

## 2014-01-30 MED ORDER — FENTANYL CITRATE 0.05 MG/ML IJ SOLN
INTRAMUSCULAR | Status: AC
  Filled 2014-01-30: qty 5

## 2014-01-30 MED ORDER — MIDAZOLAM HCL 2 MG/2ML IJ SOLN
INTRAMUSCULAR | Status: AC
  Filled 2014-01-30: qty 2

## 2014-01-30 MED ORDER — SENNOSIDES-DOCUSATE SODIUM 8.6-50 MG PO TABS: 1.0000 | ORAL_TABLET | Freq: Every evening | ORAL | Status: DC | PRN

## 2014-01-30 MED ORDER — ONDANSETRON HCL 4 MG PO TABS
4.0000 mg | ORAL_TABLET | Freq: Four times a day (QID) | ORAL | Status: DC | PRN
Start: 2014-01-30 — End: 2014-02-01

## 2014-01-30 MED ORDER — 0.9 % SODIUM CHLORIDE (POUR BTL) OPTIME
TOPICAL | Status: DC | PRN
  Administered 2014-01-30: 1000 mL

## 2014-01-30 MED ORDER — ACETAMINOPHEN 650 MG RE SUPP
650.0000 mg | Freq: Four times a day (QID) | RECTAL | Status: DC | PRN
Start: 2014-01-30 — End: 2014-02-01

## 2014-01-30 MED ORDER — ALBUTEROL SULFATE (2.5 MG/3ML) 0.083% IN NEBU
2.5000 mg | INHALATION_SOLUTION | Freq: Four times a day (QID) | RESPIRATORY_TRACT | Status: DC | PRN
Start: 2014-01-30 — End: 2014-02-01

## 2014-01-30 MED ORDER — HYDROCHLOROTHIAZIDE 12.5 MG PO CAPS
12.5000 mg | ORAL_CAPSULE | Freq: Every day | ORAL | Status: DC
  Administered 2014-01-30: 12.5 mg via ORAL
  Filled 2014-01-30 (×3): qty 1

## 2014-01-30 MED ORDER — MENTHOL 3 MG MT LOZG: 1.0000 | LOZENGE | OROMUCOSAL | Status: DC | PRN

## 2014-01-30 MED ORDER — CEFAZOLIN SODIUM-DEXTROSE 2-3 GM-% IV SOLR
2.0000 g | Freq: Four times a day (QID) | INTRAVENOUS | Status: AC
  Administered 2014-01-30 (×2): 2 g via INTRAVENOUS
  Filled 2014-01-30 (×2): qty 50

## 2014-01-30 MED ORDER — OXYCODONE HCL 5 MG PO TABS
5.0000 mg | ORAL_TABLET | ORAL | Status: DC | PRN
  Administered 2014-01-30 – 2014-02-01 (×9): 10 mg via ORAL
  Filled 2014-01-30 (×9): qty 2

## 2014-01-30 MED ORDER — PROPOFOL 10 MG/ML IV BOLUS
INTRAVENOUS | Status: DC | PRN
  Administered 2014-01-30: 200 mg via INTRAVENOUS

## 2014-01-30 MED ORDER — HYDROMORPHONE HCL 1 MG/ML IJ SOLN
0.2500 mg | INTRAMUSCULAR | Status: DC | PRN
  Administered 2014-01-30 (×4): 0.5 mg via INTRAVENOUS

## 2014-01-30 MED ORDER — MAGNESIUM CITRATE PO SOLN
1.0000 | Freq: Once | ORAL | Status: AC | PRN
Start: 2014-01-30 — End: 2014-01-30

## 2014-01-30 SURGICAL SUPPLY — 53 items
BLADE SAGITTAL 25.0X1.27X90 (BLADE) ×2 IMPLANT
BLADE SAW SGTL 13.0X1.19X90.0M (BLADE) ×2 IMPLANT
BLADE SURG 21 STRL SS (BLADE) ×3 IMPLANT
BNDG COHESIVE 6X5 TAN STRL LF (GAUZE/BANDAGES/DRESSINGS) ×3 IMPLANT
BNDG GAUZE ELAST 4 BULKY (GAUZE/BANDAGES/DRESSINGS) ×1 IMPLANT
BONE CEMENT PALACOSE (Orthopedic Implant) ×4 IMPLANT
BOWL SMART MIX CTS (DISPOSABLE) ×2 IMPLANT
CAP POR NKTM CP VIT E LN CER ×1 IMPLANT
CEMENT BONE PALACOSE (Orthopedic Implant) ×2 IMPLANT
COVER SURGICAL LIGHT HANDLE (MISCELLANEOUS) ×2 IMPLANT
CUFF TOURNIQUET SINGLE 34IN LL (TOURNIQUET CUFF) ×1 IMPLANT
CUFF TOURNIQUET SINGLE 44IN (TOURNIQUET CUFF) IMPLANT
DRAPE EXTREMITY T 121X128X90 (DRAPE) ×2 IMPLANT
DRAPE PROXIMA HALF (DRAPES) ×2 IMPLANT
DRAPE U-SHAPE 47X51 STRL (DRAPES) ×2 IMPLANT
DRSG ADAPTIC 3X8 NADH LF (GAUZE/BANDAGES/DRESSINGS) ×2 IMPLANT
DRSG PAD ABDOMINAL 8X10 ST (GAUZE/BANDAGES/DRESSINGS) ×2 IMPLANT
DURAPREP 26ML APPLICATOR (WOUND CARE) ×2 IMPLANT
ELECT REM PT RETURN 9FT ADLT (ELECTROSURGICAL) ×2
ELECTRODE REM PT RTRN 9FT ADLT (ELECTROSURGICAL) ×1 IMPLANT
FACESHIELD WRAPAROUND (MASK) ×2 IMPLANT
FACESHIELD WRAPAROUND OR TEAM (MASK) ×1 IMPLANT
GAUZE SPONGE 4X4 12PLY STRL (GAUZE/BANDAGES/DRESSINGS) ×2 IMPLANT
GLOVE BIOGEL PI IND STRL 9 (GLOVE) ×1 IMPLANT
GLOVE BIOGEL PI INDICATOR 9 (GLOVE) ×1
GLOVE SURG ORTHO 9.0 STRL STRW (GLOVE) ×3 IMPLANT
GOWN STRL REUS W/ TWL LRG LVL3 (GOWN DISPOSABLE) IMPLANT
GOWN STRL REUS W/ TWL XL LVL3 (GOWN DISPOSABLE) ×3 IMPLANT
GOWN STRL REUS W/TWL LRG LVL3 (GOWN DISPOSABLE) ×4
GOWN STRL REUS W/TWL XL LVL3 (GOWN DISPOSABLE) ×2
HANDPIECE INTERPULSE COAX TIP (DISPOSABLE) ×2
KIT BASIN OR (CUSTOM PROCEDURE TRAY) ×2 IMPLANT
KIT ROOM TURNOVER OR (KITS) ×2 IMPLANT
MANIFOLD NEPTUNE II (INSTRUMENTS) ×2 IMPLANT
NDL SPNL 18GX3.5 QUINCKE PK (NEEDLE) ×1 IMPLANT
NEEDLE SPNL 18GX3.5 QUINCKE PK (NEEDLE) ×2 IMPLANT
NS IRRIG 1000ML POUR BTL (IV SOLUTION) ×2 IMPLANT
PACK TOTAL JOINT (CUSTOM PROCEDURE TRAY) ×2 IMPLANT
PAD ARMBOARD 7.5X6 YLW CONV (MISCELLANEOUS) ×5 IMPLANT
PADDING CAST COTTON 6X4 STRL (CAST SUPPLIES) ×1 IMPLANT
SET HNDPC FAN SPRY TIP SCT (DISPOSABLE) ×1 IMPLANT
SPONGE GAUZE 4X4 12PLY STER LF (GAUZE/BANDAGES/DRESSINGS) ×1 IMPLANT
STAPLER VISISTAT 35W (STAPLE) ×2 IMPLANT
SUCTION FRAZIER TIP 10 FR DISP (SUCTIONS) ×1 IMPLANT
SUT VIC AB 0 CTB1 27 (SUTURE) ×2 IMPLANT
SUT VIC AB 1 CTX 36 (SUTURE) ×2
SUT VIC AB 1 CTX36XBRD ANBCTR (SUTURE) IMPLANT
SYR 50ML LL SCALE MARK (SYRINGE) ×2 IMPLANT
TOWEL OR 17X24 6PK STRL BLUE (TOWEL DISPOSABLE) ×2 IMPLANT
TOWEL OR 17X26 10 PK STRL BLUE (TOWEL DISPOSABLE) ×2 IMPLANT
TRAY FOLEY CATH 16FRSI W/METER (SET/KITS/TRAYS/PACK) IMPLANT
WATER STERILE IRR 1000ML POUR (IV SOLUTION) ×2 IMPLANT
WRAP KNEE MAXI GEL POST OP (GAUZE/BANDAGES/DRESSINGS) ×2 IMPLANT

## 2014-01-30 NOTE — Anesthesia Preprocedure Evaluation (Addendum)
Anesthesia Evaluation  Patient identified by MRN, date of birth, ID band Patient awake    Reviewed: Allergy & Precautions, H&P , NPO status , Patient's Chart, lab work & pertinent test results  Airway Mallampati: II TM Distance: >3 FB Neck ROM: Full    Dental no notable dental hx. (+) Edentulous Upper, Edentulous Lower   Pulmonary sleep apnea ,  breath sounds clear to auscultation  Pulmonary exam normal       Cardiovascular hypertension, Pt. on medications Rhythm:Regular Rate:Normal     Neuro/Psych negative neurological ROS  negative psych ROS   GI/Hepatic negative GI ROS, Neg liver ROS,   Endo/Other  Morbid obesity  Renal/GU negative Renal ROS  negative genitourinary   Musculoskeletal negative musculoskeletal ROS (+)   Abdominal (+) + obese,   Peds negative pediatric ROS (+)  Hematology negative hematology ROS (+)   Anesthesia Other Findings   Reproductive/Obstetrics negative OB ROS                          Anesthesia Physical Anesthesia Plan  ASA: III  Anesthesia Plan: General   Post-op Pain Management:    Induction: Intravenous  Airway Management Planned: Oral ETT  Additional Equipment:   Intra-op Plan:   Post-operative Plan: Extubation in OR  Informed Consent: I have reviewed the patients History and Physical, chart, labs and discussed the procedure including the risks, benefits and alternatives for the proposed anesthesia with the patient or authorized representative who has indicated his/her understanding and acceptance.   Dental advisory given  Plan Discussed with: CRNA  Anesthesia Plan Comments: (FNB)        Anesthesia Quick Evaluation

## 2014-01-30 NOTE — Anesthesia Procedure Notes (Signed)
Anesthesia Regional Block:  Femoral nerve block  Pre-Anesthetic Checklist: ,, timeout performed, Correct Patient, Correct Site, Correct Laterality, Correct Procedure, Correct Position, site marked, Risks and benefits discussed,  Surgical consent,  Pre-op evaluation,  At surgeon's request and post-op pain management  Laterality: Right and Lower  Prep: chloraprep       Needles:  Injection technique: Single-shot  Needle Type: Stimiplex     Needle Length: 10cm 10 cm Needle Gauge: 21 and 21 G    Additional Needles:  Procedures: ultrasound guided (picture in chart) and nerve stimulator Femoral nerve block Narrative:  Injection made incrementally with aspirations every 5 mL.  Performed by: Personally  Anesthesiologist: Montez Hageman MD  Additional Notes: Patient tolerated the procedure well without complications

## 2014-01-30 NOTE — Transfer of Care (Signed)
Immediate Anesthesia Transfer of Care Note  Patient: Eddie Fletcher  Procedure(s) Performed: Procedure(s): Right Total Knee Arthroplasty (Right)  Patient Location: PACU  Anesthesia Type:General  Level of Consciousness: awake, alert  and oriented  Airway & Oxygen Therapy: Patient Spontanous Breathing and Patient connected to face mask oxygen  Post-op Assessment: Report given to PACU RN, Post -op Vital signs reviewed and stable and Patient moving all extremities X 4  Post vital signs: Reviewed and stable  Complications: No apparent anesthesia complications

## 2014-01-30 NOTE — Op Note (Signed)
01/30/2014  12:18 PM  PATIENT:  Eddie Fletcher    PRE-OPERATIVE DIAGNOSIS:  Osteoarthritis Right Knee  POST-OPERATIVE DIAGNOSIS:  Same  PROCEDURE:  Right Total Knee Arthroplasty  SURGEON:  Newt Minion, MD  PHYSICIAN ASSISTANT:None ANESTHESIA:   General  PREOPERATIVE INDICATIONS:  Eddie Fletcher is a  58 y.o. male with a diagnosis of Osteoarthritis Right Knee who failed conservative measures and elected for surgical management.    The risks benefits and alternatives were discussed with the patient preoperatively including but not limited to the risks of infection, bleeding, nerve injury, cardiopulmonary complications, the need for revision surgery, among others, and the patient was willing to proceed.  OPERATIVE IMPLANTS: Zimmer implants. Size 5 femur. Size D. tibia. 13 mm polyethylene tray. 29 mm patella. Vitamin D and reached poly-ethylene  OPERATIVE FINDINGS: Good bone quality  OPERATIVE PROCEDURE: Patient is a 58 year old gentleman osteoarthritis of his right knee he has failed conservative care and presents at this time for total knee arthroplasty. Risks and benefits were discussed patient states he understands and wished to proceed at this time.  Patient was brought to the operating room after undergoing a femoral block. Patient underwent a general anesthetic. After adequate levels of anesthesia were obtained patient's right lower extremity was prepped using DuraPrep draped into a sterile field Ioban was used to cover all exposed skin. A timeout was called. A midline incision was made carried down to a medial parapatellar retinacular incision. Intramedullary guide was used to resect 10 mm off the distal femur 6 of valgus. External alignment guide was used to resect 10 mm off the proximal tibia neutral varus and valgus 3 posterior slope. This was then sized for a size D. in the cuts were made for the size D. tibia. Attention was then focused on the femur. This sized for a  size 5 and cuts were made for the size 5 femur 3 external rotation. Trial components were placed the knee was stable with 13 mm polyethylene tray the patella was resurfaced and cuts were made for a size 29 mm patella. The meniscal tissue loose bodies were removed. The popliteal fossa was injected with 60 cc diluted Exparel. The knee was irrigated with pulsatile lavage. The final components were cemented in place the femoral and tibial components cemented the polyethylene tray was placed the knee was left in extension until the cement hardened. The patella was left clamped in place as well. The clamps were removed the knee was placed a full range of motion the patella tracked midline. The knee was irrigated again with pulsatile lavage the retinaculum was closed using #1 Vicryl. Subcutaneous is closed using 0 Vicryl. Skin was closed using staples. Sterile compressive dressing was applied. Patient was extubated taken to the PACU in stable condition.

## 2014-01-30 NOTE — Anesthesia Postprocedure Evaluation (Signed)
  Anesthesia Post-op Note  Patient: Eddie Fletcher  Procedure(s) Performed: Procedure(s) (LRB): Right Total Knee Arthroplasty (Right)  Patient Location: PACU  Anesthesia Type: GA combined with regional for post-op pain  Level of Consciousness: awake and alert   Airway and Oxygen Therapy: Patient Spontanous Breathing  Post-op Pain: mild  Post-op Assessment: Post-op Vital signs reviewed, Patient's Cardiovascular Status Stable, Respiratory Function Stable, Patent Airway and No signs of Nausea or vomiting  Last Vitals:  Filed Vitals:   01/30/14 1330  BP: 121/57  Pulse: 90  Temp:   Resp: 10    Post-op Vital Signs: stable   Complications: No apparent anesthesia complications

## 2014-01-30 NOTE — H&P (Signed)
TOTAL KNEE ADMISSION H&P  Patient is being admitted for right total knee arthroplasty.  Subjective:  Chief Complaint:right knee pain.  HPI: Eddie Fletcher, 58 y.o. male, has a history of pain and functional disability in the right knee due to arthritis and has failed non-surgical conservative treatments for greater than 12 weeks to includeNSAID's and/or analgesics, corticosteriod injections, use of assistive devices, weight reduction as appropriate and activity modification.  Onset of symptoms was gradual, starting 8 years ago with gradually worsening course since that time. The patient noted no past surgery on the right knee(s).  Patient currently rates pain in the right knee(s) at 8 out of 10 with activity. Patient has night pain, worsening of pain with activity and weight bearing, pain that interferes with activities of daily living, pain with passive range of motion, crepitus and joint swelling.  Patient has evidence of subchondral cysts, subchondral sclerosis, periarticular osteophytes and joint space narrowing by imaging studies. This patient has had avascular necrosis of the knee. There is no active infection.  There are no active problems to display for this patient.  Past Medical History  Diagnosis Date  . Edema     Leg  . Obesity   . Chest discomfort     15 years ago; had negative testing and told it was related to stress  . Diaphoresis   . Cellulitis   . GERD (gastroesophageal reflux disease)   . Cardiomegaly   . Hypertension   . Sleep apnea     cpap 3 yrs-not used for 3-4 months  . SOB (shortness of breath)     occ with exersion  . History of kidney stones   . Arthritis     Past Surgical History  Procedure Laterality Date  . Carpal tunnel release Bilateral 04  . Trigger finger release Right 12    rt thumb  . Knee arthroscopy Right 12    No prescriptions prior to admission   Allergies  Allergen Reactions  . Keflex [Cephalexin] Rash  . Sulfa Antibiotics Rash     History  Substance Use Topics  . Smoking status: Never Smoker   . Smokeless tobacco: Not on file  . Alcohol Use: No     Comment: quit 15 yrs    Family History  Problem Relation Age of Onset  . Peripheral vascular disease Father      Review of Systems  All other systems reviewed and are negative.   Objective:  Physical Exam  Vital signs in last 24 hours:    Labs:   There is no height or weight on file to calculate BMI.   Imaging Review Plain radiographs demonstrate moderate degenerative joint disease of the right knee(s). The overall alignment ismild varus. The bone quality appears to be adequate for age and reported activity level.  Assessment/Plan:  End stage arthritis, right knee   The patient history, physical examination, clinical judgment of the provider and imaging studies are consistent with end stage degenerative joint disease of the right knee(s) and total knee arthroplasty is deemed medically necessary. The treatment options including medical management, injection therapy arthroscopy and arthroplasty were discussed at length. The risks and benefits of total knee arthroplasty were presented and reviewed. The risks due to aseptic loosening, infection, stiffness, patella tracking problems, thromboembolic complications and other imponderables were discussed. The patient acknowledged the explanation, agreed to proceed with the plan and consent was signed. Patient is being admitted for inpatient treatment for surgery, pain control, PT, OT, prophylactic antibiotics, VTE  prophylaxis, progressive ambulation and ADL's and discharge planning. The patient is planning to be discharged home with home health services

## 2014-01-30 NOTE — Progress Notes (Signed)
Utilization review completed.  

## 2014-01-31 ENCOUNTER — Encounter (HOSPITAL_COMMUNITY): Payer: Self-pay | Admitting: Orthopedic Surgery

## 2014-01-31 LAB — BASIC METABOLIC PANEL
Anion gap: 7 (ref 5–15)
BUN: 23 mg/dL (ref 6–23)
CALCIUM: 8.2 mg/dL — AB (ref 8.4–10.5)
CHLORIDE: 97 meq/L (ref 96–112)
CO2: 28 mEq/L (ref 19–32)
CREATININE: 1.67 mg/dL — AB (ref 0.50–1.35)
GFR calc Af Amer: 51 mL/min — ABNORMAL LOW (ref >90)
GFR calc non Af Amer: 44 mL/min — ABNORMAL LOW (ref >90)
Glucose, Bld: 115 mg/dL — ABNORMAL HIGH (ref 70–99)
Potassium: 4.5 mEq/L (ref 3.7–5.3)
Sodium: 132 mEq/L — ABNORMAL LOW (ref 137–147)

## 2014-01-31 LAB — CBC
HEMATOCRIT: 31.3 % — AB (ref 39.0–52.0)
Hemoglobin: 10.4 g/dL — ABNORMAL LOW (ref 13.0–17.0)
MCH: 29 pg (ref 26.0–34.0)
MCHC: 33.2 g/dL (ref 30.0–36.0)
MCV: 87.2 fL (ref 78.0–100.0)
Platelets: 231 10*3/uL (ref 150–400)
RBC: 3.59 MIL/uL — ABNORMAL LOW (ref 4.22–5.81)
RDW: 14.2 % (ref 11.5–15.5)
WBC: 10.4 10*3/uL (ref 4.0–10.5)

## 2014-01-31 MED ORDER — LACTATED RINGERS IV BOLUS (SEPSIS)
250.0000 mL | Freq: Once | INTRAVENOUS | Status: AC
  Administered 2014-01-31: 250 mL via INTRAVENOUS

## 2014-01-31 NOTE — Evaluation (Signed)
Physical Therapy Evaluation Patient Details Name: Eddie Fletcher MRN: [id number] DOB: 1956/01/03 Today's Date: 01/31/2014   History of Present Illness  Pt s/p R TKA due to arthritis.  Clinical Impression  Pt admitted with R TKA. Pt currently with functional limitations due to the deficits listed below (see PT Problem List).  Pt will benefit from skilled PT to increase their independence and safety with mobility to allow discharge to the venue listed below. Pt requiring +2 A at time of eval and not able to take any steps, plus dizziness with hypotension.  At this time recommend SNF for rehab, but if pt progresses well during acute stay, may be able to go home with HHPT.     Follow Up Recommendations SNF    Equipment Recommendations  None recommended by PT    Recommendations for Other Services       Precautions / Restrictions Precautions Precautions: Fall Restrictions Weight Bearing Restrictions: Yes RLE Weight Bearing: Weight bearing as tolerated Other Position/Activity Restrictions: Per report from nursing (not found in chart)      Mobility  Bed Mobility Overal bed mobility: Needs Assistance;+2 for physical assistance Bed Mobility: Sit to Supine     Supine to sit: +2 for physical assistance;Mod assist Sit to supine: Max assist;+2 for safety/equipment   General bed mobility comments: cues for technique and positioning.  Transfers Overall transfer level: Needs assistance Equipment used: Rolling walker (2 wheeled) Transfers: Sit to/from Stand Sit to Stand: +2 physical assistance;Max assist         General transfer comment: Cues for hand placement and for using momentum to help.  Once half way pt needed A to get fully upright.  Ambulation/Gait Ambulation/Gait assistance: Total assist           General Gait Details: unable to take any steps.  bed moved and chair brought behind pt.   Pt reported feeling dizzy and BP taken 81/49.    Stairs             Wheelchair Mobility    Modified Rankin (Stroke Patients Only)       Balance Overall balance assessment: Needs assistance Sitting-balance support: Single extremity supported;Feet supported Sitting balance-Leahy Scale: Fair     Standing balance support: Bilateral upper extremity supported Standing balance-Leahy Scale: Zero                               Pertinent Vitals/Pain Pain Assessment: 0-10 Pain Score: 5  Pain Descriptors / Indicators: Aching Pain Intervention(s): Premedicated before session;Repositioned    Home Living Family/patient expects to be discharged to:: Skilled nursing facility Living Arrangements: Spouse/significant other Available Help at Discharge: Family;Available PRN/intermittently Type of Home: House Home Access: Ramped entrance     Home Layout: One level Home Equipment: Crutches;Cane - single point      Prior Function Level of Independence: Independent with assistive device(s)         Comments: crutches at home and cane at work.     Hand Dominance   Dominant Hand: Right    Extremity/Trunk Assessment   Upper Extremity Assessment: Overall WFL for tasks assessed           Lower Extremity Assessment: RLE deficits/detail RLE Deficits / Details: Limited PROM due to pain.  poor quad set and little to no AROM.    Cervical / Trunk Assessment: Normal  Communication   Communication: No difficulties  Cognition Arousal/Alertness: Awake/alert Behavior During Therapy: [facility]  for tasks assessed/performed Overall Cognitive Status: Within Functional Limits for tasks assessed                      General Comments General comments (skin integrity, edema, etc.): Pt moaning throughout treatment and lethargic.    Exercises Total Joint Exercises Ankle Circles/Pumps: AROM;Both;10 reps;Supine Quad Sets: Strengthening;10 reps;Right;Supine Heel Slides: AAROM;Right;10 reps;Supine Hip ABduction/ADduction: AAROM;Right;10  reps;Supine Goniometric ROM: R knee PROM lmited due to guarding and pain.  flex 10-15 degrees.      Assessment/Plan    PT Assessment Patient needs continued PT services  PT Diagnosis Difficulty walking;Acute pain   PT Problem List Decreased strength;Decreased range of motion;Decreased activity tolerance;Decreased balance;Decreased mobility;Pain  PT Treatment Interventions     PT Goals (Current goals can be found in the Care Plan section) Acute Rehab PT Goals Patient Stated Goal: to rehab then home PT Goal Formulation: With patient Time For Goal Achievement: 02/14/14 Potential to Achieve Goals: Good    Frequency 7X/week   Barriers to discharge        Co-evaluation   Reason for Co-Treatment: Complexity of the patient's impairments (multi-system involvement);For patient/therapist safety   OT goals addressed during session: ADL's and self-care       End of Session Equipment Utilized During Treatment: Gait belt Activity Tolerance: Treatment limited secondary to medical complications (Comment);Patient limited by pain;Patient limited by lethargy Patient left: in chair;with call bell/phone within reach Nurse Communication: Mobility status;Other (comment) (BP)         Time: [id number] PT Time Calculation (min): 45 min   Charges:   PT Evaluation $Initial PT Evaluation Tier I: 1 Procedure PT Treatments $Therapeutic Exercise: 8-22 mins $Therapeutic Activity: 8-22 mins   PT G Codes:          [name] 01/31/2014, 12:46 PM

## 2014-01-31 NOTE — Clinical Social Work Note (Signed)
CSW met with pt at bedside.  PT is recommending SNF.  CSW completed assessment.  Pt is agreeable.  SNF search to be completed for [facility] and [facility].  Full assessment to follow.  Nonnie Done, [place] [phone]  Psychiatric & Orthopedics (5N 1-16) Clinical Social Worker

## 2014-01-31 NOTE — Care Management Note (Signed)
CARE MANAGEMENT NOTE 01/31/2014  Patient:  DELANO, ARTUSO   Account Number:  [id number]  Date Initiated:  01/31/2014  Documentation initiated by:  Ricki Miller  Subjective/Objective Assessment:   58 yr old male admitted with right knee osteoarthritis, s/p right total knee arthroplasty.     Action/Plan:   Patient preoperatively setup with [facility]. Will need shortterm rehab at [facility]. Social worker notified.   Anticipated DC Date:  02/01/2014   Anticipated DC Plan:  SKILLED NURSING FACILITY  In-house referral  Clinical Social Worker      DC Planning Services  CM consult      [facility] Choice  NA   Choice offered to / List presented to:             Status of service:  In process, will continue to follow Medicare Important Message given?   (If response is "NO", the following Medicare IM given date fields will be blank) Date Medicare IM given:   Medicare IM given by:   Date Additional Medicare IM given:   Additional Medicare IM given by:    Discharge Disposition:    Per UR Regulation:  Reviewed for med. necessity/level of care/duration of stay  If discussed at [place] of Stay Meetings, dates discussed:    Comments:

## 2014-01-31 NOTE — Progress Notes (Signed)
Patient refused CPAP.  Patient aware if he changes his mind to have RN call RT.

## 2014-01-31 NOTE — Progress Notes (Addendum)
Patient ID: Eddie Fletcher, male   DOB: 1955/12/24, 58 y.o.   MRN: [id number] Received page for Nursing, patient is running low BP this am. H/o cardiomegally, TKA 9/23.  Patient is alert and oriented and conversing with nurse. O2Sats are normal. LR bolus ordered and STAT H/H Will hold antihypertensive agents. Dr. Sharol Given informed. Echocardiogram 12/2008 normal

## 2014-01-31 NOTE — Evaluation (Signed)
Occupational Therapy Evaluation Patient Details Name: Eddie Fletcher MRN: [id number] DOB: 09-27-1955 Today's Date: 01/31/2014    History of Present Illness Pt s/p R TKA due to arthritis.   Clinical Impression   This 58 yo male admitted with and underwent above presents with low BP, dizziness, increased pain, decreased mobility, decreased balance, decreased ROM, and obesity all affecting his ability to care for him self and only has intermittent A from his family at home. Pt will benefit from acute OT with follow up at SNF to get to an Independent to mod I before returning home.    Follow Up Recommendations  SNF    Equipment Recommendations  3 in 1 bedside comode       Precautions / Restrictions Precautions Precautions: Fall Restrictions Weight Bearing Restrictions: Yes RLE Weight Bearing: Weight bearing as tolerated Other Position/Activity Restrictions: Per report from nursing (not found in chart)      Mobility Bed Mobility Overal bed mobility: Needs Assistance;+2 for physical assistance Bed Mobility: Sit to Supine     Supine to sit: +2 for physical assistance;Mod assist Sit to supine: Max assist;+2 for safety/equipment   General bed mobility comments: cues for technique and positioning.  Transfers Overall transfer level: Needs assistance Equipment used: Rolling walker (2 wheeled) Transfers: Sit to/from Stand Sit to Stand: +2 physical assistance;Max assist         General transfer comment: Cues for hand placement and for using momentum to help.  Once half way pt needed A to get fully upright.    Balance Overall balance assessment: Needs assistance Sitting-balance support: Single extremity supported;Feet supported Sitting balance-Leahy Scale: Fair     Standing balance support: Bilateral upper extremity supported Standing balance-Leahy Scale: Zero                              ADL Overall ADL's : Needs assistance/impaired Eating/Feeding:  Independent;Sitting   Grooming: Set up;Sitting   Upper Body Bathing: Set up;Sitting   Lower Body Bathing: Maximal assistance (with max A +2 (sit to stand))   Upper Body Dressing : Set up;Sitting   Lower Body Dressing: Total assistance (with max A +2 (sit to stand))   Toilet Transfer: Maximal assistance;+2 for physical assistance;RW (Sit>stand recliner--moved recliner from behind him and brought bed up to him to sit on )   Toileting- Clothing Manipulation and Hygiene: Total assistance (with max A +2 (sit to stand))                         Pertinent Vitals/Pain Pain Assessment: 0-10 Pain Score: 5  Pain Descriptors / Indicators: Aching Pain Intervention(s): Premedicated before session;Repositioned     Hand Dominance Right   Extremity/Trunk Assessment Upper Extremity Assessment Upper Extremity Assessment: Overall WFL for tasks assessed   Lower Extremity Assessment Lower Extremity Assessment: RLE deficits/detail RLE Deficits / Details: Limited PROM due to pain.  poor quad set and little to no AROM. RLE: Unable to fully assess due to pain   Cervical / Trunk Assessment Cervical / Trunk Assessment: Normal   Communication Communication Communication: No difficulties   Cognition Arousal/Alertness: Awake/alert Behavior During Therapy: WFL for tasks assessed/performed Overall Cognitive Status: Within Functional Limits for tasks assessed                                Home Living Family/patient expects to be  discharged to:: Skilled nursing facility Living Arrangements: Spouse/significant other Available Help at Discharge: Family;Available PRN/intermittently Type of Home: House Home Access: Ramped entrance     Home Layout: One level     Bathroom Shower/Tub: Walk-in Hydrologist: Standard     Home Equipment: Crutches;Cane - single point          Prior Functioning/Environment Level of Independence: Independent with assistive  device(s)        Comments: crutches at home and cane at work.    OT Diagnosis: Generalized weakness;Acute pain   OT Problem List: Decreased strength;Decreased range of motion;Decreased activity tolerance;Impaired balance (sitting and/or standing);Pain;Obesity;Decreased knowledge of precautions;Decreased knowledge of use of DME or AE   OT Treatment/Interventions: Self-care/ADL training;Patient/family education;Balance training;DME and/or AE instruction    OT Goals(Current goals can be found in the care plan section) Acute Rehab OT Goals Patient Stated Goal: to rehab then home OT Goal Formulation: With patient Time For Goal Achievement: 02/07/14 Potential to Achieve Goals: Good  OT Frequency: Min 2X/week   Barriers to D/C: Decreased caregiver support          Co-evaluation PT/OT/SLP Co-Evaluation/Treatment: Yes Reason for Co-Treatment: Complexity of the patient's impairments (multi-system involvement);For patient/therapist safety   OT goals addressed during session: ADL's and self-care      End of Session Equipment Utilized During Treatment: Gait belt;Rolling walker Nurse Communication:  (BP still low (see PT note) even after back to bed)  Activity Tolerance: Patient limited by fatigue Patient left: in bed;with call bell/phone within reach   Time: 1156-1210 OT Time Calculation (min): 14 min Charges:  OT General Charges $OT Visit: 1 Procedure OT Evaluation $Initial OT Evaluation Tier I: 1 Procedure  Almon Register [id number] 01/31/2014, 12:32 PM

## 2014-01-31 NOTE — Progress Notes (Signed)
Manual BP 78/45. Lethargic, but alert to voice. Oriented times 4. MD notified. Orders placed. Lactated Ringer bolus started. Will continue to monitor. Call bell within reach.  Domingo Dimes RN

## 2014-01-31 NOTE — Progress Notes (Signed)
Patient ID: Eddie Fletcher, male   DOB: Feb 17, 1956, 58 y.o.   MRN: [id number] Postoperative day 1 right total knee arthroplasty. Labs pending. Physical therapy progressive ambulation. Discharge planning as per recommendations from physical therapy.

## 2014-01-31 NOTE — Progress Notes (Signed)
Physical Therapy Treatment Patient Details Name: Eddie Fletcher MRN: [id number] DOB: 1955/12/26 Today's Date: 01/31/2014    History of Present Illness Pt s/p R TKA due to arthritis.    PT Comments    Pt more awake than previous session, but with decreased BP 77/49 in supine.  Pt unable to take steps and MAX of 2 for standing.  Follow Up Recommendations  SNF     Equipment Recommendations  None recommended by PT    Recommendations for Other Services       Precautions / Restrictions Precautions Precautions: Fall Restrictions Weight Bearing Restrictions: Yes RLE Weight Bearing: Weight bearing as tolerated Other Position/Activity Restrictions: Per report from nursing (not found in chart)    Mobility  Bed Mobility Overal bed mobility: Needs Assistance;+2 for physical assistance Bed Mobility: Sit to Supine     Supine to sit: +2 for physical assistance;Mod assist Sit to supine: Max assist;+2 for safety/equipment   General bed mobility comments: cues for technique and positioning.  Transfers Overall transfer level: Needs assistance Equipment used: Rolling walker (2 wheeled) Transfers: Sit to/from Stand Sit to Stand: +2 physical assistance;Max assist         General transfer comment: Cues for hand placement and for using momentum to help.  Once half way pt needed A to get fully upright.  Ambulation/Gait Ambulation/Gait assistance: Total assist           General Gait Details: unable to take any steps.  Stood and chair moved and bed brought behind patient   Financial trader Rankin (Stroke Patients Only)       Balance Overall balance assessment: Needs assistance Sitting-balance support: Feet supported;Single extremity supported Sitting balance-Leahy Scale: Fair     Standing balance support: Bilateral upper extremity supported Standing balance-Leahy Scale: Zero                      Cognition  Arousal/Alertness: Awake/alert Behavior During Therapy: WFL for tasks assessed/performed Overall Cognitive Status: Within Functional Limits for tasks assessed                      Exercises Total Joint Exercises Ankle Circles/Pumps: AROM;Both;10 reps;Supine Quad Sets: Strengthening;10 reps;Right;Supine Heel Slides: AAROM;Right;10 reps;Supine Hip ABduction/ADduction: AAROM;Right;10 reps;Supine Goniometric ROM: R knee PROM lmited due to guarding and pain.  flex 10-15 degrees.    General Comments General comments (skin integrity, edema, etc.): Pt moaning throughout treatment and lethargic.      Pertinent Vitals/Pain Pain Assessment: 0-10 Pain Score: 5  Pain Descriptors / Indicators: Aching Pain Intervention(s): Premedicated before session;Repositioned    Home Living Family/patient expects to be discharged to:: Skilled nursing facility Living Arrangements: Spouse/significant other Available Help at Discharge: Family;Available PRN/intermittently Type of Home: House Home Access: Ramped entrance   Home Layout: One level Home Equipment: Crutches;Cane - single point      Prior Function Level of Independence: Independent with assistive device(s)      Comments: crutches at home and cane at work.   PT Goals (current goals can now be found in the care plan section) Acute Rehab PT Goals Patient Stated Goal: to rehab then home PT Goal Formulation: With patient Time For Goal Achievement: 02/14/14 Potential to Achieve Goals: Good Progress towards PT goals: Progressing toward goals    Frequency  7X/week    PT Plan Current plan remains appropriate    Co-evaluation PT/OT/SLP  Co-Evaluation/Treatment: Yes Reason for Co-Treatment: Complexity of the patient's impairments (multi-system involvement);For patient/therapist safety PT goals addressed during session: Mobility/safety with mobility;Balance;Proper use of DME OT goals addressed during session: ADL's and self-care      End of Session Equipment Utilized During Treatment: Gait belt Activity Tolerance: Treatment limited secondary to medical complications (Comment) (decreased BP) Patient left: in bed;with call bell/phone within reach     Time: 1158-1212 PT Time Calculation (min): 14 min  Charges:  $Therapeutic Exercise: 8-22 mins $Therapeutic Activity: 8-22 mins                    G Codes:      Kabeer Hoagland LUBECK 01/31/2014, 12:57 PM

## 2014-02-01 LAB — CBC WITH DIFFERENTIAL/PLATELET
Basophils Absolute: 0 10*3/uL (ref 0.0–0.1)
Basophils Relative: 0 % (ref 0–1)
EOS ABS: 0.2 10*3/uL (ref 0.0–0.7)
EOS PCT: 2 % (ref 0–5)
HEMATOCRIT: 29.2 % — AB (ref 39.0–52.0)
HEMOGLOBIN: 9.7 g/dL — AB (ref 13.0–17.0)
LYMPHS ABS: 0.8 10*3/uL (ref 0.7–4.0)
LYMPHS PCT: 10 % — AB (ref 12–46)
MCH: 29.8 pg (ref 26.0–34.0)
MCHC: 33.2 g/dL (ref 30.0–36.0)
MCV: 89.8 fL (ref 78.0–100.0)
MONO ABS: 1.5 10*3/uL — AB (ref 0.1–1.0)
MONOS PCT: 19 % — AB (ref 3–12)
Neutro Abs: 5.5 10*3/uL (ref 1.7–7.7)
Neutrophils Relative %: 69 % (ref 43–77)
Platelets: 151 10*3/uL (ref 150–400)
RBC: 3.25 MIL/uL — AB (ref 4.22–5.81)
RDW: 14 % (ref 11.5–15.5)
WBC: 8 10*3/uL (ref 4.0–10.5)

## 2014-02-01 LAB — BASIC METABOLIC PANEL
Anion gap: 7 (ref 5–15)
BUN: 25 mg/dL — AB (ref 6–23)
CALCIUM: 8.2 mg/dL — AB (ref 8.4–10.5)
CO2: 28 mEq/L (ref 19–32)
Chloride: 103 mEq/L (ref 96–112)
Creatinine, Ser: 1.49 mg/dL — ABNORMAL HIGH (ref 0.50–1.35)
GFR calc Af Amer: 58 mL/min — ABNORMAL LOW (ref >90)
GFR calc non Af Amer: 50 mL/min — ABNORMAL LOW (ref >90)
GLUCOSE: 126 mg/dL — AB (ref 70–99)
Potassium: 4.8 mEq/L (ref 3.7–5.3)
Sodium: 138 mEq/L (ref 137–147)

## 2014-02-01 MED ORDER — METHOCARBAMOL 500 MG PO TABS: 500.0000 mg | ORAL_TABLET | Freq: Three times a day (TID) | ORAL | Status: DC

## 2014-02-01 MED ORDER — OXYCODONE-ACETAMINOPHEN 5-325 MG PO TABS: 1.0000 | ORAL_TABLET | ORAL | Status: DC | PRN

## 2014-02-01 MED ORDER — ASPIRIN EC 325 MG PO TBEC: 325.0000 mg | DELAYED_RELEASE_TABLET | Freq: Every day | ORAL | Status: DC

## 2014-02-01 NOTE — Progress Notes (Signed)
Patient to be d/c to [facility], report called to SNF, IV has been removed.

## 2014-02-01 NOTE — Progress Notes (Signed)
Physical Therapy Treatment Patient Details Name: Eddie Fletcher MRN: [id number] DOB: [birth year] Today's Date: 02/01/2014    History of Present Illness Pt s/p R TKA due to arthritis.    PT Comments    Patient able to walk a little this morning. Stated that he felt better today but still very fatigued. Continue to recommend SNF for ongoing Physical Therapy.     Follow Up Recommendations  SNF     Equipment Recommendations  None recommended by PT    Recommendations for Other Services       Precautions / Restrictions Precautions Precautions: Fall Restrictions RLE Weight Bearing: Weight bearing as tolerated    Mobility  Bed Mobility         Supine to sit: Max assist     General bed mobility comments: cues for technique and positioning. A for LE and for trunk control  Transfers Overall transfer level: Needs assistance Equipment used: Rolling walker (2 wheeled)   Sit to Stand: +2 physical assistance;Mod assist         General transfer comment: Cues for hand placement and for using momentum to help.  Once half way pt needed A to get fully upright. bed highten to ease stand  Ambulation/Gait Ambulation/Gait assistance: +2 safety/equipment;Mod assist Ambulation Distance (Feet): 10 Feet Assistive device: Rolling walker (2 wheeled) Gait Pattern/deviations: Step-to pattern;Decreased step length - right;Decreased stance time - left   Gait velocity interpretation: Below normal speed for age/gender General Gait Details: Cues for RW management and gait sequence. A with RW management and balance   Stairs            Wheelchair Mobility    Modified Rankin (Stroke Patients Only)       Balance                                    Cognition Arousal/Alertness: Awake/alert Behavior During Therapy: WFL for tasks assessed/performed Overall Cognitive Status: Within Functional Limits for tasks assessed                      Exercises Total  Joint Exercises Quad Sets: 10 reps;Right;AAROM Heel Slides: AAROM;Right;10 reps Hip ABduction/ADduction: AAROM;Right;10 reps Straight Leg Raises: AAROM;Right;10 reps    General Comments        Pertinent Vitals/Pain Pain Score: 4  Pain Location: R knee Pain Descriptors / Indicators: Aching Pain Intervention(s): Monitored during session;Repositioned    Home Living                      Prior Function            PT Goals (current goals can now be found in the care plan section) Progress towards PT goals: Progressing toward goals    Frequency  7X/week    PT Plan Current plan remains appropriate    Co-evaluation             End of Session Equipment Utilized During Treatment: Gait belt Activity Tolerance: Patient limited by fatigue Patient left: in chair;with call bell/phone within reach     Time: 1004-1028 PT Time Calculation (min): 24 min  Charges:  $Gait Training: 8-22 mins $Therapeutic Exercise: 8-22 mins                    G Codes:      Jacqualyn Posey 02/01/2014, 12:58 PM  02/01/2014 Pearlee Arvizu, Tonia Brooms PTA  [phone] pager [phone] office

## 2014-02-01 NOTE — Clinical Social Work Note (Signed)
CSW met with pt at bedside and reviewed bed offers.  Pt chooses [facility].  CSW contacted SNF Liaison to confirm bed availability and confirm SNF is contracted with [employer] insurance.  SNF is currently requesting authorization from insurance.  CSW contacted MD office re: dc summary and signature for FL2.  CSW awaiting a return call.  Gina , LCSWA [phone]  Psychiatric & Orthopedics (5N 1-16) Clinical Social Worker   

## 2014-02-01 NOTE — Discharge Summary (Signed)
Physician Discharge Summary  Patient ID: Eddie Fletcher MRN: [id number] DOB/AGE: February 06, 1956 58 y.o.  Admit date: 01/30/2014 Discharge date: 02/01/2014  Admission Diagnoses: Osteoarthritis right knee  Discharge Diagnoses:  Active Problems:   Total knee replacement status   Discharged Condition: stable  Hospital Course: Patient's hospital course was essentially unremarkable. He underwent a total knee arthroplasty. Postoperatively patient progressed slowly with therapy and was discharged to skilled nursing.  Consults: None  Significant Diagnostic Studies: labs: Routine labs  Treatments: surgery: See operative note  Discharge Exam: Blood pressure 102/49, pulse 103, temperature 98.8 F (37.1 C), temperature source Oral, resp. rate 20, height 5\' 8"  (1.727 m), weight 139.708 kg (308 lb), SpO2 98.00%. Incision/Wound: dressing clean dry and intact  Disposition: 01-Home or Self Care  Discharge Instructions   Call MD / Call 911    Complete by:  As directed   If you experience chest pain or shortness of breath, CALL 911 and be transported to the hospital emergency room.  If you develope a fever above 101 F, pus (white drainage) or increased drainage or redness at the wound, or calf pain, call your surgeon's office.     Constipation Prevention    Complete by:  As directed   Drink plenty of fluids.  Prune juice may be helpful.  You may use a stool softener, such as Colace (over the counter) 100 mg twice a day.  Use MiraLax (over the counter) for constipation as needed.     Diet - low sodium heart healthy    Complete by:  As directed      Increase activity slowly as tolerated    Complete by:  As directed      Weight bearing as tolerated    Complete by:  As directed             Medication List         albuterol 108 (90 BASE) MCG/ACT inhaler  Commonly known as:  PROVENTIL HFA;VENTOLIN HFA  Inhale 1-2 puffs into the lungs every 6 (six) hours as needed for wheezing or shortness of  breath.     aspirin EC 325 MG tablet  Take 1 tablet (325 mg total) by mouth daily.     cholecalciferol 1000 UNITS tablet  Commonly known as:  VITAMIN D  Take 1,000 Units by mouth daily.     hydrochlorothiazide 12.5 MG capsule  Commonly known as:  MICROZIDE  Take 12.5 mg by mouth daily.     ibuprofen 800 MG tablet  Commonly known as:  ADVIL,MOTRIN  Take 800 mg by mouth 2 (two) times daily as needed for mild pain.     lisinopril 20 MG tablet  Commonly known as:  PRINIVIL,ZESTRIL  Take 20 mg by mouth daily.     methocarbamol 500 MG tablet  Commonly known as:  ROBAXIN  Take 1 tablet (500 mg total) by mouth 3 (three) times daily.     oxyCODONE-acetaminophen 5-325 MG per tablet  Commonly known as:  ROXICET  Take 1 tablet by mouth every 4 (four) hours as needed for severe pain.     tamsulosin 0.4 MG Caps capsule  Commonly known as:  FLOMAX  Take 0.4 mg by mouth at bedtime.     trolamine salicylate 10 % cream  Commonly known as:  ASPERCREME  Apply 1 application topically as needed for muscle pain.           Follow-up Information   Follow up with DUDA,MARCUS V, MD In 2 weeks.  Specialty:  Orthopedic Surgery   Contact information:   [place] [address] [phone]       Signed: Newt Minion 02/01/2014, 4:03 PM

## 2014-02-01 NOTE — Progress Notes (Signed)
Pt d/c to [facility] via [facility].  Pt agreeable to tx.

## 2014-02-01 NOTE — Progress Notes (Signed)
Patient ID: Eddie Fletcher, male   DOB: 1955-07-25, 58 y.o.   MRN: [id number] Patient without complaints this morning. Postoperative day 2 total knee arthroplasty. Blood pressure stabilized. We will discontinue his IV and have the dressing changed. Plan for discharge to skilled nursing. F. L2 not on the chart yet.

## 2014-02-04 ENCOUNTER — Other Ambulatory Visit: Payer: Self-pay | Admitting: *Deleted

## 2014-02-04 MED ORDER — OXYCODONE-ACETAMINOPHEN 5-325 MG PO TABS: ORAL_TABLET | ORAL | Status: DC

## 2014-02-04 MED ORDER — OXYCODONE-ACETAMINOPHEN 5-325 MG PO TABS: 1.0000 | ORAL_TABLET | ORAL | Status: DC | PRN

## 2014-02-04 NOTE — Telephone Encounter (Signed)
[facility] 

## 2014-02-05 ENCOUNTER — Non-Acute Institutional Stay (SKILLED_NURSING_FACILITY): Payer: Managed Care, Other (non HMO) | Admitting: Internal Medicine

## 2014-02-05 ENCOUNTER — Encounter: Payer: Self-pay | Admitting: Internal Medicine

## 2014-02-05 DIAGNOSIS — M1710 Unilateral primary osteoarthritis, unspecified knee: Secondary | ICD-10-CM

## 2014-02-05 DIAGNOSIS — Z96651 Presence of right artificial knee joint: Secondary | ICD-10-CM

## 2014-02-05 DIAGNOSIS — K635 Polyp of colon: Secondary | ICD-10-CM | POA: Insufficient documentation

## 2014-02-05 DIAGNOSIS — G4730 Sleep apnea, unspecified: Secondary | ICD-10-CM

## 2014-02-05 DIAGNOSIS — E559 Vitamin D deficiency, unspecified: Secondary | ICD-10-CM | POA: Insufficient documentation

## 2014-02-05 DIAGNOSIS — I10 Essential (primary) hypertension: Secondary | ICD-10-CM | POA: Insufficient documentation

## 2014-02-05 DIAGNOSIS — Z96659 Presence of unspecified artificial knee joint: Secondary | ICD-10-CM

## 2014-02-05 DIAGNOSIS — M1711 Unilateral primary osteoarthritis, right knee: Secondary | ICD-10-CM

## 2014-02-05 DIAGNOSIS — D869 Sarcoidosis, unspecified: Secondary | ICD-10-CM | POA: Insufficient documentation

## 2014-02-05 DIAGNOSIS — G4733 Obstructive sleep apnea (adult) (pediatric): Secondary | ICD-10-CM | POA: Insufficient documentation

## 2014-02-05 NOTE — Progress Notes (Signed)
Patient ID: Eddie Fletcher, male   DOB: 1955-07-16, 58 y.o.   MRN: [id number]  Provider:  Rexene Edison. Mariea Clonts, D.O., C.M.D. Location:  [facility]  PCP: Curly Rim, MD  Code Status: full code  Allergies  Allergen Reactions  . Keflex [Cephalexin] Rash  . Sulfa Antibiotics Rash    Chief Complaint  Patient presents with  . New Admit To SNF    new admission to snf for rehab s/p right total knee arthroplasty by Dr. Sharol Given    HPI: 58 y.o. male with h/o obesity, GERD, HTN, shortness of breath, BPH, vitamin D deficiency, OSA, sarcoid and colon polyps was admitted here for short term rehab s/p hospitalization from 9/18-25 with right knee OA that was not improving with conservative measures.  He underwent right total knee arthroplasty be Dr. Sharol Given on 01/30/14.    ROS: Review of Systems  Constitutional: Negative for malaise/fatigue.  HENT: Negative for congestion.   Eyes: Negative for blurred vision.  Respiratory: Positive for shortness of breath.        Has sleep apnea  Cardiovascular: Negative for chest pain.  Gastrointestinal: Positive for heartburn. Negative for abdominal pain.  Genitourinary: Positive for frequency.       BPH  Musculoskeletal: Positive for myalgias and joint pain. Negative for falls.  Skin: Negative for rash.  Neurological: Negative for dizziness.  Psychiatric/Behavioral: Negative for memory loss.     Past Medical History  Diagnosis Date  . Edema     Leg  . Obesity   . Chest discomfort     15 years ago; had negative testing and told it was related to stress  . Diaphoresis   . Cellulitis   . GERD (gastroesophageal reflux disease)   . Cardiomegaly   . Hypertension   . Sleep apnea     cpap 3 yrs-not used for 3-4 months  . SOB (shortness of breath)     occ with exersion  . History of kidney stones   . Arthritis    Past Surgical History  Procedure Laterality Date  . Carpal tunnel release Bilateral 04  . Trigger finger release Right 12     rt thumb  . Knee arthroscopy Right 12  . Total knee arthroplasty Right 01/30/2014    Procedure: Right Total Knee Arthroplasty;  Surgeon: Newt Minion, MD;  Location: [place];  Service: Orthopedics;  Laterality: Right;   Social History:   reports that he has never smoked. He does not have any smokeless tobacco history on file. He reports that he does not drink alcohol or use illicit drugs.  Family History  Problem Relation Age of Onset  . Peripheral vascular disease Father     Medications: Patient's Medications  New Prescriptions   No medications on file  Previous Medications   ALBUTEROL (PROVENTIL HFA;VENTOLIN HFA) 108 (90 BASE) MCG/ACT INHALER    Inhale 1-2 puffs into the lungs every 6 (six) hours as needed for wheezing or shortness of breath.   ASPIRIN EC 325 MG TABLET    Take 1 tablet (325 mg total) by mouth daily.   CHOLECALCIFEROL (VITAMIN D) 1000 UNITS TABLET    Take 1,000 Units by mouth daily.   HYDROCHLOROTHIAZIDE (MICROZIDE) 12.5 MG CAPSULE    Take 12.5 mg by mouth daily.   IBUPROFEN (ADVIL,MOTRIN) 800 MG TABLET    Take 800 mg by mouth 2 (two) times daily as needed for mild pain.   LISINOPRIL (PRINIVIL,ZESTRIL) 20 MG TABLET    Take 20 mg  by mouth daily.   METHOCARBAMOL (ROBAXIN) 500 MG TABLET    Take 1 tablet (500 mg total) by mouth 3 (three) times daily.   OXYCODONE-ACETAMINOPHEN (ROXICET) 5-325 MG PER TABLET    Take one tablet by mouth every 4 hours as needed for severe pain   TAMSULOSIN (FLOMAX) 0.4 MG CAPS CAPSULE    Take 0.4 mg by mouth at bedtime.   TROLAMINE SALICYLATE (ASPERCREME) 10 % CREAM    Apply 1 application topically as needed for muscle pain.  Modified Medications   No medications on file  Discontinued Medications   No medications on file     Physical Exam: Filed Vitals:   02/05/14 1009  BP: 131/52  Pulse: 88  Temp: 98.2 F (36.8 C)  Resp: 18  Height: 5\' 8"  (1.727 m)  Weight: 314 lb (142.429 kg)  Physical Exam  Constitutional: No distress.    HENT:  Head: Normocephalic and atraumatic.  Left Ear: External ear normal.  Nose: Nose normal.  Mouth/Throat: Oropharynx is clear and moist. No oropharyngeal exudate.  Eyes: Conjunctivae and EOM are normal. Pupils are equal, round, and reactive to light.  Neck: Normal range of motion. Neck supple. No JVD present.  Cardiovascular: Normal rate, regular rhythm, normal heart sounds and intact distal pulses.   Pulmonary/Chest: Effort normal and breath sounds normal. No respiratory distress.  Abdominal: Soft. Bowel sounds are normal. He exhibits no distension and no mass. There is no tenderness.  Musculoskeletal: He exhibits tenderness.  Of right knee, working on ROM  Neurological: He is alert.  Skin:  Persistent swelling of right knee, no significant erythema, is warm, no drainage  Psychiatric: He has a normal mood and affect.    Labs reviewed: Basic Metabolic Panel:  Recent Labs  01/23/14 1418 01/31/14 0655 02/01/14 0655  NA 134* 132* 138  K 4.1 4.5 4.8  CL 97 97 103  CO2 28 28 28   GLUCOSE 99 115* 126*  BUN 23 23 25*  CREATININE 1.09 1.67* 1.49*  CALCIUM 9.0 8.2* 8.2*   Liver Function Tests:  Recent Labs  01/23/14 1418  AST 20  ALT 22  ALKPHOS 118*  BILITOT 0.4  PROT 8.3  ALBUMIN 3.1*   No results found for this basename: LIPASE, AMYLASE,  in the last 8760 hours No results found for this basename: AMMONIA,  in the last 8760 hours CBC:  Recent Labs  01/23/14 1418 01/31/14 0655 02/01/14 0655  WBC 7.2 10.4 8.0  NEUTROABS  --   --  5.5  HGB 12.4* 10.4* 9.7*  HCT 36.6* 31.3* 29.2*  MCV 86.1 87.2 89.8  PLT 216 231 151   Imaging and Procedures: MRI right knee w/o contrast 10/04/10:   1. Severe chondromalacia of the patellofemoral compartment.  2. Incomplete radial tear of the root of the posterior horn of the medial meniscus.  3. Focal chondromalacia of the medial femoral condyle and medial tibial plateau.  4. Prominent but nonspecific subcutaneous edema  anterior to the knee.  CXR 01/23/14:  New bilateral diffuse reticulonodular pulmonary opacities. This is a nonspecific finding and could indicate variant of interstitial edema although viral/atypical infection could appear similar. The patient is reportedly asymptomatic. Further non upper urgent evaluation with chest CT without contrast may be helpful for further characterization.  Assessment/Plan 1. Primary osteoarthritis of right knee - s/p TKA 9/23 by Dr. Sharol Given -keep f/u -cont pt, ot here with goal to return home  2. Status post total right knee replacement -as above, monitor swelling, if  persists, may need sooner f/u with Dr. Sharol Given  3. Essential hypertension -bp at goal today  4. Apnea, sleep -should be using cpap  Functional status:  independent  Family/ staff Communication: seen with unit supervisor  Labs/tests ordered:  Cbc, bmp, f/u Dr. Sharol Given

## 2014-02-19 ENCOUNTER — Ambulatory Visit: Payer: Managed Care, Other (non HMO) | Admitting: Physical Therapy

## 2014-04-12 ENCOUNTER — Other Ambulatory Visit: Payer: Self-pay | Admitting: Orthopedic Surgery

## 2014-04-12 DIAGNOSIS — M5442 Lumbago with sciatica, left side: Principal | ICD-10-CM

## 2014-04-12 DIAGNOSIS — M5441 Lumbago with sciatica, right side: Secondary | ICD-10-CM

## 2014-04-22 ENCOUNTER — Ambulatory Visit
Admission: RE | Admit: 2014-04-22 | Discharge: 2014-04-22 | Disposition: A | Discharge status: Home / Self Care | Payer: 59 | Source: Ambulatory Visit | Attending: Orthopedic Surgery | Admitting: Orthopedic Surgery

## 2014-04-22 DIAGNOSIS — M5442 Lumbago with sciatica, left side: Principal | ICD-10-CM

## 2014-04-22 DIAGNOSIS — M5441 Lumbago with sciatica, right side: Secondary | ICD-10-CM

## 2014-06-03 ENCOUNTER — Encounter: Payer: Self-pay | Admitting: Internal Medicine

## 2014-12-31 ENCOUNTER — Other Ambulatory Visit (HOSPITAL_COMMUNITY): Payer: Self-pay | Admitting: Orthopedic Surgery

## 2015-01-02 ENCOUNTER — Encounter (HOSPITAL_COMMUNITY): Payer: Self-pay | Admitting: *Deleted

## 2015-01-02 MED ORDER — CLINDAMYCIN PHOSPHATE 900 MG/50ML IV SOLN
900.0000 mg | INTRAVENOUS | Status: AC
  Administered 2015-01-03: 900 mg via INTRAVENOUS
  Filled 2015-01-02: qty 50

## 2015-01-02 MED ORDER — CHLORHEXIDINE GLUCONATE 4 % EX LIQD: 60.0000 mL | Freq: Once | CUTANEOUS | Status: DC

## 2015-01-02 NOTE — Progress Notes (Signed)
Pt denies being short of breath at present but stated that he has a history of SOB with exertion. Pt denies chest pain and being under the care of a cardiologist. Pt denies a cardiac cath but stated that a stress test was completed 10 years ago. Pt made aware to stop taking  Aspirin,otc vitamins and herbal medications. Do not take any NSAIDs ie: Ibuprofen, Advil, Naproxen or any medication containing Aspirin. Pt verbalized understanding of all pre-op instructions.

## 2015-01-03 ENCOUNTER — Inpatient Hospital Stay (HOSPITAL_COMMUNITY)
Admission: RE | Admit: 2015-01-03 | Discharge: 2015-01-06 | DRG: 486 | Disposition: A | Discharge status: Home / Self Care | Payer: Managed Care, Other (non HMO) | Source: Ambulatory Visit | Attending: Orthopedic Surgery | Admitting: Orthopedic Surgery

## 2015-01-03 ENCOUNTER — Inpatient Hospital Stay (HOSPITAL_COMMUNITY): Payer: Managed Care, Other (non HMO) | Admitting: Anesthesiology

## 2015-01-03 ENCOUNTER — Encounter (HOSPITAL_COMMUNITY): Payer: Self-pay

## 2015-01-03 ENCOUNTER — Encounter (HOSPITAL_COMMUNITY)
Admission: RE | Disposition: A | Discharge status: Home / Self Care | Payer: Self-pay | Source: Ambulatory Visit | Attending: Orthopedic Surgery

## 2015-01-03 DIAGNOSIS — G4730 Sleep apnea, unspecified: Secondary | ICD-10-CM | POA: Diagnosis present

## 2015-01-03 DIAGNOSIS — Z881 Allergy status to other antibiotic agents status: Secondary | ICD-10-CM

## 2015-01-03 DIAGNOSIS — M009 Pyogenic arthritis, unspecified: Secondary | ICD-10-CM | POA: Diagnosis present

## 2015-01-03 DIAGNOSIS — I10 Essential (primary) hypertension: Secondary | ICD-10-CM | POA: Diagnosis present

## 2015-01-03 DIAGNOSIS — Z96651 Presence of right artificial knee joint: Secondary | ICD-10-CM | POA: Diagnosis present

## 2015-01-03 DIAGNOSIS — D869 Sarcoidosis, unspecified: Secondary | ICD-10-CM | POA: Diagnosis present

## 2015-01-03 DIAGNOSIS — Z8249 Family history of ischemic heart disease and other diseases of the circulatory system: Secondary | ICD-10-CM

## 2015-01-03 DIAGNOSIS — Z882 Allergy status to sulfonamides status: Secondary | ICD-10-CM | POA: Diagnosis not present

## 2015-01-03 DIAGNOSIS — Z6841 Body Mass Index (BMI) 40.0 and over, adult: Secondary | ICD-10-CM

## 2015-01-03 DIAGNOSIS — Y831 Surgical operation with implant of artificial internal device as the cause of abnormal reaction of the patient, or of later complication, without mention of misadventure at the time of the procedure: Secondary | ICD-10-CM | POA: Diagnosis present

## 2015-01-03 DIAGNOSIS — T8453XA Infection and inflammatory reaction due to internal right knee prosthesis, initial encounter: Secondary | ICD-10-CM | POA: Diagnosis present

## 2015-01-03 DIAGNOSIS — K219 Gastro-esophageal reflux disease without esophagitis: Secondary | ICD-10-CM | POA: Diagnosis present

## 2015-01-03 DIAGNOSIS — M25561 Pain in right knee: Secondary | ICD-10-CM | POA: Diagnosis present

## 2015-01-03 DIAGNOSIS — Z888 Allergy status to other drugs, medicaments and biological substances status: Secondary | ICD-10-CM | POA: Diagnosis not present

## 2015-01-03 HISTORY — DX: Other seasonal allergic rhinitis: J30.2

## 2015-01-03 HISTORY — DX: Presence of spectacles and contact lenses: Z97.3

## 2015-01-03 HISTORY — PX: TOTAL KNEE REVISION: SHX996

## 2015-01-03 HISTORY — PX: APPLICATION OF WOUND VAC: SHX5189

## 2015-01-03 HISTORY — DX: Other complications of anesthesia, initial encounter: T88.59XA

## 2015-01-03 HISTORY — DX: Adverse effect of unspecified anesthetic, initial encounter: T41.45XA

## 2015-01-03 LAB — COMPREHENSIVE METABOLIC PANEL WITH GFR
ALT: 32 U/L (ref 17–63)
AST: 35 U/L (ref 15–41)
Albumin: 3.2 g/dL — ABNORMAL LOW (ref 3.5–5.0)
Alkaline Phosphatase: 106 U/L (ref 38–126)
Anion gap: 8 (ref 5–15)
BUN: 18 mg/dL (ref 6–20)
CO2: 29 mmol/L (ref 22–32)
Calcium: 9 mg/dL (ref 8.9–10.3)
Chloride: 99 mmol/L — ABNORMAL LOW (ref 101–111)
Creatinine, Ser: 0.98 mg/dL (ref 0.61–1.24)
GFR calc Af Amer: >60 mL/min
GFR calc non Af Amer: >60 mL/min
Glucose, Bld: 122 mg/dL — ABNORMAL HIGH (ref 65–99)
Potassium: 3.7 mmol/L (ref 3.5–5.1)
Sodium: 136 mmol/L (ref 135–145)
Total Bilirubin: 0.8 mg/dL (ref 0.3–1.2)
Total Protein: 8.3 g/dL — ABNORMAL HIGH (ref 6.5–8.1)

## 2015-01-03 LAB — CBC
HEMATOCRIT: 40.6 % (ref 39.0–52.0)
HEMOGLOBIN: 13.9 g/dL (ref 13.0–17.0)
MCH: 29 pg (ref 26.0–34.0)
MCHC: 34.2 g/dL (ref 30.0–36.0)
MCV: 84.8 fL (ref 78.0–100.0)
Platelets: 234 10*3/uL (ref 150–400)
RBC: 4.79 MIL/uL (ref 4.22–5.81)
RDW: 13.6 % (ref 11.5–15.5)
WBC: 5.2 10*3/uL (ref 4.0–10.5)

## 2015-01-03 LAB — PROTIME-INR
INR: 1.11 (ref 0.00–1.49)
PROTHROMBIN TIME: 14.5 s (ref 11.6–15.2)

## 2015-01-03 LAB — APTT: aPTT: 27 seconds (ref 24–37)

## 2015-01-03 SURGERY — TOTAL KNEE REVISION
Anesthesia: General | Site: Knee | Laterality: Right

## 2015-01-03 MED ORDER — ACETAMINOPHEN 325 MG PO TABS: 650.0000 mg | ORAL_TABLET | Freq: Four times a day (QID) | ORAL | Status: DC | PRN

## 2015-01-03 MED ORDER — SODIUM CHLORIDE 0.9 % IV SOLN
INTRAVENOUS | Status: DC
  Administered 2015-01-05: 11:00:00 via INTRAVENOUS

## 2015-01-03 MED ORDER — OXYCODONE HCL 5 MG PO TABS
5.0000 mg | ORAL_TABLET | ORAL | Status: DC | PRN
  Administered 2015-01-03 – 2015-01-05 (×6): 10 mg via ORAL
  Administered 2015-01-05: 5 mg via ORAL
  Filled 2015-01-03 (×4): qty 2
  Filled 2015-01-03: qty 1
  Filled 2015-01-03 (×2): qty 2

## 2015-01-03 MED ORDER — LACTATED RINGERS IV SOLN
INTRAVENOUS | Status: DC
  Administered 2015-01-03 (×2): via INTRAVENOUS

## 2015-01-03 MED ORDER — ONDANSETRON HCL 4 MG PO TABS: 4.0000 mg | ORAL_TABLET | Freq: Four times a day (QID) | ORAL | Status: DC | PRN

## 2015-01-03 MED ORDER — ONDANSETRON HCL 4 MG/2ML IJ SOLN: 4.0000 mg | Freq: Four times a day (QID) | INTRAMUSCULAR | Status: DC | PRN

## 2015-01-03 MED ORDER — VANCOMYCIN HCL 1000 MG IV SOLR
INTRAVENOUS | Status: DC | PRN
  Administered 2015-01-03: 1 g

## 2015-01-03 MED ORDER — SODIUM CHLORIDE 0.9 % IR SOLN
Status: DC | PRN
  Administered 2015-01-03: 3000 mL

## 2015-01-03 MED ORDER — GENTAMICIN SULFATE 40 MG/ML IJ SOLN
INTRAMUSCULAR | Status: AC
  Filled 2015-01-03: qty 4

## 2015-01-03 MED ORDER — METHOCARBAMOL 500 MG PO TABS
500.0000 mg | ORAL_TABLET | Freq: Four times a day (QID) | ORAL | Status: DC | PRN
  Administered 2015-01-04 – 2015-01-05 (×3): 500 mg via ORAL
  Filled 2015-01-03 (×4): qty 1

## 2015-01-03 MED ORDER — ONDANSETRON HCL 4 MG/2ML IJ SOLN
INTRAMUSCULAR | Status: AC
  Filled 2015-01-03: qty 2

## 2015-01-03 MED ORDER — FLUTICASONE PROPIONATE 50 MCG/ACT NA SUSP
1.0000 | Freq: Every day | NASAL | Status: DC
  Administered 2015-01-04 – 2015-01-06 (×3): 1 via NASAL
  Filled 2015-01-03: qty 16

## 2015-01-03 MED ORDER — BISACODYL 5 MG PO TBEC: 5.0000 mg | DELAYED_RELEASE_TABLET | Freq: Every day | ORAL | Status: DC | PRN

## 2015-01-03 MED ORDER — PROPOFOL 10 MG/ML IV BOLUS
INTRAVENOUS | Status: AC
  Filled 2015-01-03: qty 20

## 2015-01-03 MED ORDER — ASPIRIN EC 325 MG PO TBEC
325.0000 mg | DELAYED_RELEASE_TABLET | Freq: Every day | ORAL | Status: DC
  Administered 2015-01-03 – 2015-01-06 (×4): 325 mg via ORAL
  Filled 2015-01-03 (×4): qty 1

## 2015-01-03 MED ORDER — POLYETHYLENE GLYCOL 3350 17 G PO PACK: 17.0000 g | PACK | Freq: Every day | ORAL | Status: DC | PRN

## 2015-01-03 MED ORDER — OXYCODONE HCL 5 MG PO TABS
5.0000 mg | ORAL_TABLET | Freq: Once | ORAL | Status: AC | PRN
  Administered 2015-01-03: 5 mg via ORAL

## 2015-01-03 MED ORDER — PROMETHAZINE HCL 25 MG/ML IJ SOLN: 6.2500 mg | INTRAMUSCULAR | Status: DC | PRN

## 2015-01-03 MED ORDER — MIDAZOLAM HCL 5 MG/5ML IJ SOLN
INTRAMUSCULAR | Status: DC | PRN
  Administered 2015-01-03: 2 mg via INTRAVENOUS

## 2015-01-03 MED ORDER — ALBUTEROL SULFATE (2.5 MG/3ML) 0.083% IN NEBU: 2.5000 mg | INHALATION_SOLUTION | Freq: Four times a day (QID) | RESPIRATORY_TRACT | Status: DC | PRN

## 2015-01-03 MED ORDER — METOCLOPRAMIDE HCL 5 MG/ML IJ SOLN: 5.0000 mg | Freq: Three times a day (TID) | INTRAMUSCULAR | Status: DC | PRN

## 2015-01-03 MED ORDER — PROPOFOL 10 MG/ML IV BOLUS
INTRAVENOUS | Status: DC | PRN
  Administered 2015-01-03: 50 mg via INTRAVENOUS
  Administered 2015-01-03: 150 mg via INTRAVENOUS

## 2015-01-03 MED ORDER — HYDROCHLOROTHIAZIDE 12.5 MG PO CAPS
12.5000 mg | ORAL_CAPSULE | Freq: Every day | ORAL | Status: DC
Start: 2015-01-03 — End: 2015-01-06
  Administered 2015-01-03 – 2015-01-06 (×4): 12.5 mg via ORAL
  Filled 2015-01-03 (×4): qty 1

## 2015-01-03 MED ORDER — OXYCODONE HCL 5 MG/5ML PO SOLN: 5.0000 mg | Freq: Once | ORAL | Status: AC | PRN

## 2015-01-03 MED ORDER — VANCOMYCIN HCL IN DEXTROSE 1-5 GM/200ML-% IV SOLN
1000.0000 mg | Freq: Two times a day (BID) | INTRAVENOUS | Status: DC
  Administered 2015-01-03 – 2015-01-05 (×4): 1000 mg via INTRAVENOUS
  Filled 2015-01-03 (×6): qty 200

## 2015-01-03 MED ORDER — MIDAZOLAM HCL 2 MG/2ML IJ SOLN
INTRAMUSCULAR | Status: AC
  Filled 2015-01-03: qty 4

## 2015-01-03 MED ORDER — OXYCODONE HCL 5 MG PO TABS
ORAL_TABLET | ORAL | Status: DC
Start: 2015-01-03 — End: 2015-01-03
  Filled 2015-01-03: qty 1

## 2015-01-03 MED ORDER — HYDROMORPHONE HCL 1 MG/ML IJ SOLN
1.0000 mg | INTRAMUSCULAR | Status: DC | PRN
  Administered 2015-01-04: 1 mg via INTRAVENOUS
  Filled 2015-01-03: qty 1

## 2015-01-03 MED ORDER — METOCLOPRAMIDE HCL 5 MG PO TABS: 5.0000 mg | ORAL_TABLET | Freq: Three times a day (TID) | ORAL | Status: DC | PRN

## 2015-01-03 MED ORDER — GENTAMICIN SULFATE 40 MG/ML IJ SOLN
INTRAMUSCULAR | Status: DC | PRN
  Administered 2015-01-03: 240 mg

## 2015-01-03 MED ORDER — VANCOMYCIN HCL 1000 MG IV SOLR
INTRAVENOUS | Status: AC
  Filled 2015-01-03: qty 1000

## 2015-01-03 MED ORDER — ACETAMINOPHEN 650 MG RE SUPP: 650.0000 mg | Freq: Four times a day (QID) | RECTAL | Status: DC | PRN

## 2015-01-03 MED ORDER — FENTANYL CITRATE (PF) 100 MCG/2ML IJ SOLN
INTRAMUSCULAR | Status: DC | PRN
  Administered 2015-01-03 (×2): 50 ug via INTRAVENOUS
  Administered 2015-01-03: 100 ug via INTRAVENOUS
  Administered 2015-01-03: 50 ug via INTRAVENOUS

## 2015-01-03 MED ORDER — HYDROMORPHONE HCL 1 MG/ML IJ SOLN
INTRAMUSCULAR | Status: AC
  Filled 2015-01-03: qty 1

## 2015-01-03 MED ORDER — GENTAMICIN SULFATE 40 MG/ML IJ SOLN
INTRAMUSCULAR | Status: AC
  Filled 2015-01-03: qty 2

## 2015-01-03 MED ORDER — TAMSULOSIN HCL 0.4 MG PO CAPS
0.4000 mg | ORAL_CAPSULE | Freq: Every day | ORAL | Status: DC
  Administered 2015-01-03 – 2015-01-05 (×3): 0.4 mg via ORAL
  Filled 2015-01-03 (×3): qty 1

## 2015-01-03 MED ORDER — PANTOPRAZOLE SODIUM 40 MG PO TBEC
40.0000 mg | DELAYED_RELEASE_TABLET | Freq: Every day | ORAL | Status: DC
  Administered 2015-01-04 – 2015-01-06 (×3): 40 mg via ORAL
  Filled 2015-01-03 (×3): qty 1

## 2015-01-03 MED ORDER — BUPIVACAINE HCL (PF) 0.25 % IJ SOLN
INTRAMUSCULAR | Status: AC
  Filled 2015-01-03: qty 30

## 2015-01-03 MED ORDER — METHOCARBAMOL 1000 MG/10ML IJ SOLN
500.0000 mg | Freq: Four times a day (QID) | INTRAVENOUS | Status: DC | PRN
  Filled 2015-01-03: qty 5

## 2015-01-03 MED ORDER — HYDROMORPHONE HCL 1 MG/ML IJ SOLN
0.2500 mg | INTRAMUSCULAR | Status: DC | PRN
  Administered 2015-01-03 (×4): 0.5 mg via INTRAVENOUS

## 2015-01-03 MED ORDER — IRBESARTAN 75 MG PO TABS
37.5000 mg | ORAL_TABLET | Freq: Every day | ORAL | Status: DC
  Administered 2015-01-03 – 2015-01-06 (×4): 37.5 mg via ORAL
  Filled 2015-01-03 (×4): qty 1

## 2015-01-03 MED ORDER — LIDOCAINE HCL (CARDIAC) 20 MG/ML IV SOLN
INTRAVENOUS | Status: AC
  Filled 2015-01-03: qty 5

## 2015-01-03 MED ORDER — DOCUSATE SODIUM 100 MG PO CAPS
100.0000 mg | ORAL_CAPSULE | Freq: Two times a day (BID) | ORAL | Status: DC
  Administered 2015-01-03 – 2015-01-06 (×6): 100 mg via ORAL
  Filled 2015-01-03 (×6): qty 1

## 2015-01-03 MED ORDER — VITAMIN D 1000 UNITS PO TABS
1000.0000 [IU] | ORAL_TABLET | Freq: Every day | ORAL | Status: DC
  Administered 2015-01-03 – 2015-01-06 (×4): 1000 [IU] via ORAL
  Filled 2015-01-03 (×4): qty 1

## 2015-01-03 MED ORDER — LIDOCAINE HCL (CARDIAC) 20 MG/ML IV SOLN
INTRAVENOUS | Status: DC | PRN
  Administered 2015-01-03: 100 mg via INTRAVENOUS

## 2015-01-03 MED ORDER — 0.9 % SODIUM CHLORIDE (POUR BTL) OPTIME
TOPICAL | Status: DC | PRN
  Administered 2015-01-03: 1000 mL

## 2015-01-03 MED ORDER — FENTANYL CITRATE (PF) 250 MCG/5ML IJ SOLN
INTRAMUSCULAR | Status: AC
  Filled 2015-01-03: qty 5

## 2015-01-03 SURGICAL SUPPLY — 61 items
ARTISURF 13M PLY R 3-5CD KNEE (Knees) ×1 IMPLANT
BLADE SAW SAG 90X13X1.27 (BLADE) ×3 IMPLANT
BLADE SAW SGTL 13.0X1.19X90.0M (BLADE) ×3 IMPLANT
BNDG COHESIVE 6X5 TAN STRL LF (GAUZE/BANDAGES/DRESSINGS) ×6 IMPLANT
BOWL SMART MIX CTS (DISPOSABLE) IMPLANT
COVER BACK TABLE 24X17X13 BIG (DRAPES) IMPLANT
COVER SURGICAL LIGHT HANDLE (MISCELLANEOUS) ×6 IMPLANT
CUFF TOURNIQUET SINGLE 34IN LL (TOURNIQUET CUFF) IMPLANT
CUFF TOURNIQUET SINGLE 44IN (TOURNIQUET CUFF) IMPLANT
DRAPE EXTREMITY T 121X128X90 (DRAPE) ×3 IMPLANT
DRAPE IMP U-DRAPE 54X76 (DRAPES) ×3 IMPLANT
DRAPE PROXIMA HALF (DRAPES) ×3 IMPLANT
DRAPE U-SHAPE 47X51 STRL (DRAPES) ×3 IMPLANT
DRSG ADAPTIC 3X8 NADH LF (GAUZE/BANDAGES/DRESSINGS) ×3 IMPLANT
DRSG PAD ABDOMINAL 8X10 ST (GAUZE/BANDAGES/DRESSINGS) ×3 IMPLANT
DURAPREP 26ML APPLICATOR (WOUND CARE) ×3 IMPLANT
ELECT REM PT RETURN 9FT ADLT (ELECTROSURGICAL) ×3
ELECTRODE REM PT RTRN 9FT ADLT (ELECTROSURGICAL) ×2 IMPLANT
FACESHIELD WRAPAROUND (MASK) ×3 IMPLANT
FACESHIELD WRAPAROUND OR TEAM (MASK) ×2 IMPLANT
GAUZE SPONGE 4X4 12PLY STRL (GAUZE/BANDAGES/DRESSINGS) ×3 IMPLANT
GLOVE BIO SURGEON STRL SZ 6.5 (GLOVE) ×2 IMPLANT
GLOVE BIOGEL PI IND STRL 9 (GLOVE) ×2 IMPLANT
GLOVE BIOGEL PI INDICATOR 9 (GLOVE) ×1
GLOVE SURG ORTHO 9.0 STRL STRW (GLOVE) ×3 IMPLANT
GOWN STRL REUS W/ TWL XL LVL3 (GOWN DISPOSABLE) ×6 IMPLANT
GOWN STRL REUS W/TWL XL LVL3 (GOWN DISPOSABLE) ×9
HANDPIECE INTERPULSE COAX TIP (DISPOSABLE)
IMMOBILIZER KNEE 20 (SOFTGOODS) ×3
IMMOBILIZER KNEE 20 THIGH 36 (SOFTGOODS) IMPLANT
KIT BASIN OR (CUSTOM PROCEDURE TRAY) ×3 IMPLANT
KIT PREVENA INCISION MGT 13 (CANNISTER) ×1 IMPLANT
KIT ROOM TURNOVER OR (KITS) ×3 IMPLANT
KIT STIMULAN RAPID CURE  10CC (Orthopedic Implant) ×1 IMPLANT
KIT STIMULAN RAPID CURE 10CC (Orthopedic Implant) IMPLANT
MANIFOLD NEPTUNE II (INSTRUMENTS) ×3 IMPLANT
NDL 18GX1X1/2 (RX/OR ONLY) (NEEDLE) IMPLANT
NDL SPNL 18GX3.5 QUINCKE PK (NEEDLE) ×2 IMPLANT
NEEDLE 18GX1X1/2 (RX/OR ONLY) (NEEDLE) ×3 IMPLANT
NEEDLE SPNL 18GX3.5 QUINCKE PK (NEEDLE) ×3 IMPLANT
NS IRRIG 1000ML POUR BTL (IV SOLUTION) ×3 IMPLANT
PACK TOTAL JOINT (CUSTOM PROCEDURE TRAY) ×3 IMPLANT
PACK UNIVERSAL I (CUSTOM PROCEDURE TRAY) ×3 IMPLANT
PAD ARMBOARD 7.5X6 YLW CONV (MISCELLANEOUS) ×6 IMPLANT
PADDING CAST COTTON 6X4 STRL (CAST SUPPLIES) ×3 IMPLANT
SET HNDPC FAN SPRY TIP SCT (DISPOSABLE) IMPLANT
STAPLER VISISTAT 35W (STAPLE) ×3 IMPLANT
SUCTION FRAZIER TIP 10 FR DISP (SUCTIONS) IMPLANT
SUT ETHILON 2 0 PSLX (SUTURE) ×3 IMPLANT
SUT VIC AB 0 CTB1 27 (SUTURE) IMPLANT
SUT VIC AB 1 CTX 36 (SUTURE)
SUT VIC AB 1 CTX36XBRD ANBCTR (SUTURE) IMPLANT
SUT VIC AB 2-0 CTB1 (SUTURE) IMPLANT
SWAB CULTURE LIQUID MINI MALE (MISCELLANEOUS) ×1 IMPLANT
SYRINGE 12CC LL (MISCELLANEOUS) ×1 IMPLANT
TOWEL OR 17X24 6PK STRL BLUE (TOWEL DISPOSABLE) ×3 IMPLANT
TOWEL OR 17X26 10 PK STRL BLUE (TOWEL DISPOSABLE) ×3 IMPLANT
TRAY FOLEY CATH 16FRSI W/METER (SET/KITS/TRAYS/PACK) IMPLANT
TUBE ANAEROBIC SPECIMEN COL (MISCELLANEOUS) ×4 IMPLANT
WATER STERILE IRR 1000ML POUR (IV SOLUTION) ×3 IMPLANT
WRAP KNEE MAXI GEL POST OP (GAUZE/BANDAGES/DRESSINGS) ×3 IMPLANT

## 2015-01-03 NOTE — Anesthesia Postprocedure Evaluation (Signed)
  Anesthesia Post-op Note  Patient: Eddie Fletcher  Procedure(s) Performed: Procedure(s) (LRB): Revision Right Total Knee Arthroplasty, Poly Exchange, Place Antibiotic Beads (Right) APPLICATION OF INCISIONAL  WOUND VAC (Right)  Patient Location: PACU  Anesthesia Type: General  Level of Consciousness: awake and alert   Airway and Oxygen Therapy: Patient Spontanous Breathing  Post-op Pain: mild  Post-op Assessment: Post-op Vital signs reviewed, Patient's Cardiovascular Status Stable, Respiratory Function Stable, Patent Airway and No signs of Nausea or vomiting  Last Vitals:  Filed Vitals:   01/03/15 1641  BP: 127/81  Pulse: 100  Temp: 36.7 C  Resp:     Post-op Vital Signs: stable   Complications: No apparent anesthesia complications

## 2015-01-03 NOTE — H&P (Signed)
TOTAL KNEE REVISION ADMISSION H&P  Patient is being admitted for right revision total knee arthroplasty.  Subjective:  Chief Complaint:right knee pain.  HPI: Eddie Fletcher, 59 y.o. male, has a history of pain and functional disability in the right knee(s) due to Infected total knee arthroplasty and patient has failed non-surgical conservative treatments for greater than 12 weeks to include Conservative options not viable. The indications for the revision of the total knee arthroplasty are history of total knee infection. Onset of symptoms was abrupt starting 8 years ago with rapidlly worsening course since that time.  Prior procedures on the right knee(s) include None.  Patient currently rates pain in the right knee(s) at 8 out of 10 with activity. There is night pain, worsening of pain with activity and weight bearing and Purulent drainage.  Patient has evidence of Normal radiographs by imaging studies. This condition presents safety issues increasing the risk of falls. This patient has had Current active infection.  There is no current active infection.  Patient Active Problem List   Diagnosis Date Noted  . Besnier-Boeck disease 02/05/2014  . Apnea, sleep 02/05/2014  . Avitaminosis D 02/05/2014  . BP (high blood pressure) 02/05/2014  . Colon polyp 02/05/2014  . Osteoarthritis of right knee 02/05/2014  . Total knee replacement status 01/30/2014   Past Medical History  Diagnosis Date  . Edema     Leg  . Obesity   . Chest discomfort     15 years ago; had negative testing and told it was related to stress  . Diaphoresis   . Cellulitis   . GERD (gastroesophageal reflux disease)   . Cardiomegaly   . Hypertension   . Sleep apnea     cpap 3 yrs-not used for 3-4 months  . SOB (shortness of breath)     occ with exersion  . History of kidney stones   . Arthritis   . Wears glasses   . Seasonal allergies     Past Surgical History  Procedure Laterality Date  . Carpal tunnel release  Bilateral 04  . Trigger finger release Right 12    rt thumb  . Knee arthroscopy Right 12  . Total knee arthroplasty Right 01/30/2014    Procedure: Right Total Knee Arthroplasty;  Surgeon: Newt Minion, MD;  Location: [facility];  Service: Orthopedics;  Laterality: Right;  . Colonoscopy w/ biopsies and polypectomy      No prescriptions prior to admission   Allergies  Allergen Reactions  . Lisinopril Cough  . Keflex [Cephalexin] Rash  . Sulfa Antibiotics Rash    Social History  Substance Use Topics  . Smoking status: Never Smoker   . Smokeless tobacco: Never Used  . Alcohol Use: No     Comment: quit 15 yrs    Family History  Problem Relation Age of Onset  . Peripheral vascular disease Father   . Hypertension Other   . Cancer Other       Review of Systems  All other systems reviewed and are negative.    Objective:  Physical Exam  Vital signs in last 24 hours:    Labs:  Estimated body mass index is 47.75 kg/(m^2) as calculated from the following:   Height as of 02/05/14: 5\' 8"  (1.727 m).   Weight as of 02/05/14: 142.429 kg (314 lb).  Imaging Review Plain radiographs demonstrate Stable total knee arthroplasty degenerative joint disease of the right knee(s). The overall alignment is neutral.There is evidence of loosening of the None  components. The bone quality appears to be adequate for age and reported activity level. There is no abnormal radiographic findings.  Assessment/Plan:  End stage arthritis, right knee(s) with failed previous arthroplasty.   The patient history, physical examination, clinical judgment of the provider and imaging studies are consistent with end stage degenerative joint disease of the right knee(s), previous total knee arthroplasty. Revision total knee arthroplasty is deemed medically necessary. The treatment options including medical management, injection therapy, arthroscopy and revision arthroplasty were discussed at length. The risks and benefits  of revision total knee arthroplasty were presented and reviewed. The risks due to aseptic loosening, infection, stiffness, patella tracking problems, thromboembolic complications and other imponderables were discussed. The patient acknowledged the explanation, agreed to proceed with the plan and consent was signed. Patient is being admitted for inpatient treatment for surgery, pain control, PT, OT, prophylactic antibiotics, VTE prophylaxis, progressive ambulation and ADL's and discharge planning.The patient is planning to be discharged to skilled nursing facility

## 2015-01-03 NOTE — Anesthesia Procedure Notes (Signed)
Procedure Name: LMA Insertion Date/Time: 01/03/2015 3:43 PM Performed by: Melina Copa, Jimmie Rueter R Pre-anesthesia Checklist: Patient identified, Emergency Drugs available, Suction available, Patient being monitored and Timeout performed Patient Re-evaluated:Patient Re-evaluated prior to inductionOxygen Delivery Method: Circle system utilized Preoxygenation: Pre-oxygenation with 100% oxygen Intubation Type: IV induction Ventilation: Mask ventilation without difficulty LMA: LMA inserted LMA Size: 5.0 Number of attempts: 1 Placement Confirmation: positive ETCO2 and breath sounds checked- equal and bilateral Tube secured with: Tape Dental Injury: Teeth and Oropharynx as per pre-operative assessment

## 2015-01-03 NOTE — Anesthesia Preprocedure Evaluation (Addendum)
Anesthesia Evaluation  Patient identified by MRN, date of birth, ID band Patient awake    Reviewed: Allergy & Precautions, H&P , NPO status , Patient's Chart, lab work & pertinent test results  History of Anesthesia Complications (+) history of anesthetic complications  Airway Mallampati: II  TM Distance: >3 FB Neck ROM: Full    Dental  (+) Edentulous Upper, Edentulous Lower   Pulmonary sleep apnea ,  breath sounds clear to auscultation  Pulmonary exam normal       Cardiovascular hypertension, Pt. on medications Normal cardiovascular examRhythm:Regular Rate:Normal     Neuro/Psych negative neurological ROS  negative psych ROS   GI/Hepatic negative GI ROS, Neg liver ROS, GERD-  ,  Endo/Other  Morbid obesity  Renal/GU negative Renal ROS  negative genitourinary   Musculoskeletal negative musculoskeletal ROS (+)   Abdominal (+) + obese,   Peds negative pediatric ROS (+)  Hematology negative hematology ROS (+)   Anesthesia Other Findings   Reproductive/Obstetrics negative OB ROS                           Anesthesia Physical  Anesthesia Plan  ASA: III  Anesthesia Plan: General   Post-op Pain Management:    Induction: Intravenous  Airway Management Planned: Oral ETT  Additional Equipment:   Intra-op Plan:   Post-operative Plan: Extubation in OR  Informed Consent: I have reviewed the patients History and Physical, chart, labs and discussed the procedure including the risks, benefits and alternatives for the proposed anesthesia with the patient or authorized representative who has indicated his/her understanding and acceptance.   Dental advisory given  Plan Discussed with: CRNA and Anesthesiologist  Anesthesia Plan Comments: (GA with LMA, denies heart burn or reflux today)       Anesthesia Quick Evaluation

## 2015-01-03 NOTE — Transfer of Care (Signed)
Immediate Anesthesia Transfer of Care Note  Patient: Eddie Fletcher  Procedure(s) Performed: Procedure(s): Revision Right Total Knee Arthroplasty, Poly Exchange, Place Antibiotic Beads (Right) APPLICATION OFINCISIONAL  WOUND VAC (Right)  Patient Location: PACU  Anesthesia Type:General  Level of Consciousness: awake, oriented and patient cooperative  Airway & Oxygen Therapy: Patient Spontanous Breathing and Patient connected to nasal cannula oxygen  Post-op Assessment: Report given to RN, Post -op Vital signs reviewed and stable and Patient moving all extremities  Post vital signs: Reviewed and stable  Last Vitals:  Filed Vitals:   01/03/15 1641  BP: 127/81  Pulse: 100  Temp: 36.7 C  Resp:     Complications: No apparent anesthesia complications

## 2015-01-03 NOTE — Op Note (Signed)
01/03/2015  4:39 PM  PATIENT:  Eddie Fletcher    PRE-OPERATIVE DIAGNOSIS:  Infected Right Total Knee Arthroplasty  POST-OPERATIVE DIAGNOSIS:  Same  PROCEDURE:  Revision Right Total Knee Arthroplasty, Poly Exchange,  Place Antibiotic Beads Place wound VAC., Local tissue rearrangement for wound 3 x 7 cm  SURGEON:  DUDA,MARCUS V, MD  PHYSICIAN ASSISTANT:None ANESTHESIA:   General  PREOPERATIVE INDICATIONS:  Eddie Fletcher is a  59 y.o. male with a diagnosis of Infected Right Total Knee Arthroplasty who failed conservative measures and elected for surgical management.    The risks benefits and alternatives were discussed with the patient preoperatively including but not limited to the risks of infection, bleeding, nerve injury, cardiopulmonary complications, the need for revision surgery, among others, and the patient was willing to proceed.  OPERATIVE IMPLANTS: 13 mm polyethylene tray, antibody beads with 1 g vancomycin and 240 mg gentamicin  OPERATIVE FINDINGS: Very small deep abscess no significant purulence no loosening of the implants acute infection and  OPERATIVE PROCEDURE: Patient was brought to the operating room and underwent a general and aesthetic. After adequate levels anesthesia obtained patient's right lower extremity was prepped using DuraPrep draped into a sterile field. A midline incision was made and this was used to ellipse out the sinus drainage tract which was the inferior aspect of the wound. This caused a wound defect that was 3 x 7 cm. A medial parapatellar retinacular incision was made and tissue and fluid was sent for cultures. The polyethylene tray was removed. There was also very small film of what appear to be purulence beneath the tray and this was also sent for cultures. The wound was irrigated with pulsatile lavage. The gloves were changed and instruments were changed. The new polyethylene tray was placed 13 mm and antibody beads were placed with 10 mg  stimulant and 1 g vancomycin and 240 mg gentamicin. The retinaculum was closed using #1 Vicryls. The skin was closed with local tissue rearrangement to close the wound 7 x 3 cm this was closed with 2-0 nylon. A trephine wound VAC was applied this had good suction fit. Patient was extubated taken to the PACU in stable condition plan for placement of a knee immobilizer until the antibiotics beads have dissolved.

## 2015-01-04 NOTE — Care Management Note (Signed)
Case Management Note  Patient Details  Name: Eddie Fletcher MRN: [id number] Date of Birth: Dec 23, 1955  Subjective/Objective:    59 yr old male s/p right total knee revision due to infection. Had placement of antibiotics beads.    Action/Plan:  Case manager spoke with patient concerning discharge plan and home health needs. Patient will get PICC line for IV antibiotics per MD. Choice was offered for Home Health, referral was called to [place], [place] Liaison. Patient states he has rolling walker, 3in1 and cane at home. Has family support at discharge. Case manager will continue to monitor.    Expected Discharge Date:                   Expected Discharge Plan:   Home with Home Health  In-House Referral:  NA  Discharge planning Services  CM Consult  Post Acute Care Choice:  Home Health Choice offered to:     DME Arranged:    DME Agency:     HH Arranged:  RN, PT [place] Agency:  [facility]  Status of Service:  In process, will continue to follow  Medicare Important Message Given:    Date Medicare IM Given:    Medicare IM give by:    Date Additional Medicare IM Given:    Additional Medicare Important Message give by:     If discussed at [place] of Stay Meetings, dates discussed:    Additional Comments:  Ninfa Meeker, RN 01/04/2015, 2:07 PM

## 2015-01-04 NOTE — Progress Notes (Signed)
Utilization review completed.  

## 2015-01-04 NOTE — Evaluation (Signed)
Physical Therapy Evaluation Patient Details Name: Eddie Fletcher MRN: [id number] DOB: 1956-03-26 Today's Date: 01/04/2015   History of Present Illness  Pt is a 59 y/o M admitted w/ infected Rt TKA and now revision of Rt TKA.  Pt's PMH includes Bsnier-Boeck disease, HTN, edema, obesity, cellulitis, Bil carpal tunnel release, Rt thumb trigger finger.  Clinical Impression  Patient is s/p above surgery resulting in functional limitations due to the deficits listed below (see PT Problem List). Eddie Fletcher is motivated to return home and refuses to return to SNF. Sit>stand w/ min assist and min guard assist to ambulate 10 ft in room. Patient will benefit from skilled PT to increase their independence and safety with mobility to allow discharge to the venue listed below.     Follow Up Recommendations Home health PT;Supervision for mobility/OOB    Equipment Recommendations  3in1 (PT)    Recommendations for Other Services OT consult     Precautions / Restrictions Precautions Precautions: Knee;Fall Precaution Comments: Reviewed knee precautions Required Braces or Orthoses: Knee Immobilizer - Right Knee Immobilizer - Right: Other (comment) (no order, in room) Restrictions Weight Bearing Restrictions: Yes RLE Weight Bearing: Weight bearing as tolerated      Mobility  Bed Mobility Overal bed mobility: Needs Assistance Bed Mobility: Supine to Sit     Supine to sit: Min assist;HOB elevated     General bed mobility comments: Min assist w/ heavy use of bed rails and HOB elevated.  Assist w/ managing Rt LE.  Cues for technique and sequencing.  Transfers Overall transfer level: Needs assistance Equipment used: Rolling walker (2 wheeled) (Bariatric RW) Transfers: Sit to/from Stand Sit to Stand: Min assist;From elevated surface         General transfer comment: Min assist to power up to standing.  Cues to stand upright.  Ambulation/Gait Ambulation/Gait assistance: Min  guard Ambulation Distance (Feet): 10 Feet Assistive device: Rolling walker (2 wheeled) (Bariatric RW) Gait Pattern/deviations: Step-to pattern;Decreased stride length;Decreased stance time - right;Decreased weight shift to right;Antalgic;Trunk flexed   Gait velocity interpretation: Below normal speed for age/gender General Gait Details: Cues to stand upright as pt leans on RW 2/2 back pain.  Cues for proper breathing technique as pt becomes SOB.  SpO2 remains in 90's during mobility.  Stairs            Wheelchair Mobility    Modified Rankin (Stroke Patients Only)       Balance Overall balance assessment: Needs assistance Sitting-balance support: Bilateral upper extremity supported;Feet supported Sitting balance-Leahy Scale: Fair     Standing balance support: Bilateral upper extremity supported;During functional activity Standing balance-Leahy Scale: Poor Standing balance comment: Relies heavily on RW for support                             Pertinent Vitals/Pain Pain Assessment: 0-10 Pain Score: 4  Pain Location: Rt knee Pain Descriptors / Indicators: Aching;Discomfort Pain Intervention(s): Limited activity within patient's tolerance;Monitored during session;Repositioned    Home Living Family/patient expects to be discharged to:: Private residence Living Arrangements: Spouse/significant other;Children (43 y/o daughter) Available Help at Discharge: Family;Available 24 hours/day (wife and daughter (wife doesn't work)) Type of Home: House Home Access: [place]: One [place]: Crutches;Cane - single point;Walker - 2 wheels      Prior Function Level of Independence: Independent with assistive device(s)         Comments: PTA used  cane for short distances and 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     Hand Dominance   Dominant Hand: Right    Extremity/Trunk Assessment   Upper Extremity Assessment: Overall  WFL for tasks assessed           Lower Extremity Assessment: RLE deficits/detail RLE Deficits / Details: s/p revision of Rt TKA w/ resultant limited ROM and strength       Communication   Communication: No difficulties  Cognition Arousal/Alertness: Awake/alert Behavior During Therapy: WFL for tasks assessed/performed Overall Cognitive Status: Within Functional Limits for tasks assessed                      General Comments      Exercises Total Joint Exercises Ankle Circles/Pumps: AROM;Both;15 reps;Seated Quad Sets: Strengthening;Both;5 reps;Seated      Assessment/Plan    PT Assessment Patient needs continued PT services  PT Diagnosis Difficulty walking;Generalized weakness;Abnormality of gait;Acute pain   PT Problem List Decreased strength;Decreased range of motion;Decreased activity tolerance;Decreased balance;Decreased mobility;Decreased knowledge of use of DME;Decreased safety awareness;Decreased knowledge of precautions;Obesity;Decreased skin integrity;Pain  PT Treatment Interventions DME instruction;Stair training;Gait training;Functional mobility training;Therapeutic activities;Therapeutic exercise;Balance training;Neuromuscular re-education;Patient/family education;Modalities   PT Goals (Current goals can be found in the Care Plan section) Acute Rehab PT Goals Patient Stated Goal: to return home, does not want to go back to a SNF PT Goal Formulation: With patient Time For Goal Achievement: 01/11/15 Potential to Achieve Goals: Good    Frequency 7X/week   Barriers to discharge        Co-evaluation               End of Session Equipment Utilized During Treatment: Oxygen;Right knee immobilizer Activity Tolerance: Patient limited by fatigue Patient left: in chair;with call bell/phone within reach;with nursing/sitter in room Nurse Communication: Mobility status;Precautions;Weight bearing status         Time: [id number] PT Time Calculation  (min) (ACUTE ONLY): 24 min   Charges:   PT Evaluation $Initial PT Evaluation Tier I: 1 Procedure PT Treatments $Therapeutic Activity: 8-22 mins   PT G Codes:       Joslyn Hy PT, DPT [phone] Pager: [phone] 01/04/2015, 5:21 PM

## 2015-01-04 NOTE — Progress Notes (Signed)
Subjective: Patient stable incisional wound VAC functional   Objective: Vital signs in last 24 hours: Temp:  [98.1 F (36.7 C)-98.7 F (37.1 C)] 98.4 F (36.9 C) (08/27 0521) Pulse Rate:  [89-104] 104 (08/27 0521) Resp:  [11-21] 16 (08/27 0521) BP: (93-141)/(60-81) 101/60 mmHg (08/27 0521) SpO2:  [94 %-100 %] 94 % (08/27 0521) FiO2 (%):  [28 %] 28 % (08/26 1800) Weight:  [146.512 kg (323 lb)] 146.512 kg (323 lb) (08/26 1133)  Intake/Output from previous day: 08/26 0701 - 08/27 0700 In: 1100 [I.V.:1100] Out: 450 [Urine:450] Intake/Output this shift: Total I/O In: -  Out: 450 [Urine:450]  Exam:  Sensation intact distally Intact pulses distally Dorsiflexion/Plantar flexion intact  Labs:  Recent Labs  01/03/15 1140  HGB 13.9    Recent Labs  01/03/15 1140  WBC 5.2  RBC 4.79  HCT 40.6  PLT 234    Recent Labs  01/03/15 1140  NA 136  K 3.7  CL 99*  CO2 29  BUN 18  CREATININE 0.98  GLUCOSE 122*  CALCIUM 9.0    Recent Labs  01/03/15 1140  INR 1.11    Assessment/Plan: Plan IV anabiotic's mobilization possible discharge early next week   Delaila Nand SCOTT 01/04/2015, 7:00 AM

## 2015-01-05 LAB — VANCOMYCIN, TROUGH: Vancomycin Tr: 39 ug/mL (ref 10.0–20.0)

## 2015-01-05 MED ORDER — SODIUM CHLORIDE 0.9 % IJ SOLN: 10.0000 mL | Freq: Two times a day (BID) | INTRAMUSCULAR | Status: DC

## 2015-01-05 MED ORDER — SODIUM CHLORIDE 0.9 % IJ SOLN
10.0000 mL | INTRAMUSCULAR | Status: DC | PRN
  Administered 2015-01-05: 10 mL
  Filled 2015-01-05: qty 40

## 2015-01-05 NOTE — Progress Notes (Signed)
Physical Therapy Treatment Patient Details Name: Eddie Fletcher MRN: [id number] DOB: Nov 14, 1955 Today's Date: 01/05/2015    History of Present Illness Pt is a 59 y/o M admitted w/ infected Rt TKA and now revision of Rt TKA.  Pt's PMH includes Bsnier-Boeck disease, HTN, edema, obesity, cellulitis, Bil carpal tunnel release, Rt thumb trigger finger.    PT Comments    Pt progressign with mobility.  Increased ambulation distance.  Pt's wife present for session.  Pt cont's to fatigue quickly & limited by back & Rt knee pain but pt states he's close to baseline with mobility.  Cont with current POC.      Follow Up Recommendations  Home health PT;Supervision for mobility/OOB     Equipment Recommendations  3in1 (PT)    Recommendations for Other Services OT consult     Precautions / Restrictions Precautions Precautions: Knee;Fall Precaution Comments: Reviewed knee precautions Required Braces or Orthoses: Knee Immobilizer - Right Restrictions Weight Bearing Restrictions: Yes RLE Weight Bearing: Weight bearing as tolerated    Mobility  Bed Mobility Overal bed mobility: Needs Assistance Bed Mobility: Sit to Supine     Supine to sit: Min guard Sit to supine: Mod assist   General bed mobility comments: (A) for LE management  Transfers Overall transfer level: Needs assistance Equipment used: Rolling walker (2 wheeled) Transfers: Sit to/from Stand Sit to Stand: Min assist         General transfer comment: cues for safe body positioning before sitting.  Min assist to power up to standing.  Pt starts with wide BOS (feet on outside of walker) then brings them to middle of walker once in standing.   Ambulation/Gait Ambulation/Gait assistance: Min guard Ambulation Distance (Feet): 50 Feet (25' x 2) Assistive device: Rolling walker (2 wheeled) Gait Pattern/deviations: Step-through pattern;Decreased weight shift to right;Decreased step length - right;Decreased step length -  left;Trunk flexed Gait velocity: decreased Gait velocity interpretation: Below normal speed for age/gender General Gait Details: cues for posture, sequencing, body positioning inside RW   Stairs            Wheelchair Mobility    Modified Rankin (Stroke Patients Only)       Balance                                    Cognition Arousal/Alertness: Awake/alert Behavior During Therapy: WFL for tasks assessed/performed Overall Cognitive Status: Within Functional Limits for tasks assessed                      Exercises Total Joint Exercises Ankle Circles/Pumps: AROM;Both;10 reps Quad Sets: AROM;Both;10 reps Straight Leg Raises: AAROM;Strengthening;Right;5 reps;Supine    General Comments        Pertinent Vitals/Pain Pain Assessment: 0-10 Pain Score: 4  Pain Location: rt knee & back Pain Descriptors / Indicators: Aching Pain Intervention(s): Limited activity within patient's tolerance;Monitored during session;Premedicated before session;Repositioned    Home Living                      Prior Function            PT Goals (current goals can now be found in the care plan section) Acute Rehab PT Goals Patient Stated Goal: to return home, does not want to go back to a SNF PT Goal Formulation: With patient Time For Goal Achievement: 01/11/15 Potential to Achieve Goals: Good Progress towards  PT goals: Progressing toward goals    Frequency  7X/week    PT Plan Current plan remains appropriate    Co-evaluation             End of Session Equipment Utilized During Treatment: Right knee immobilizer Activity Tolerance: Patient limited by fatigue Patient left: in bed;with call bell/phone within reach;with family/visitor present     Time: 1323-1350 PT Time Calculation (min) (ACUTE ONLY): 27 min  Charges:  $Gait Training: 8-22 mins $Therapeutic Activity: 8-22 mins                    G Codes:      Sena Hitch 01/05/2015, 2:01 PM   Sarajane Marek, PTA [phone] 01/05/2015

## 2015-01-05 NOTE — Progress Notes (Signed)
Peripherally Inserted Central Catheter/Midline Placement  The IV Nurse has discussed with the patient and/or persons authorized to consent for the patient, the purpose of this procedure and the potential benefits and risks involved with this procedure.  The benefits include less needle sticks, lab draws from the catheter and patient may be discharged home with the catheter.  Risks include, but not limited to, infection, bleeding, blood clot (thrombus formation), and puncture of an artery; nerve damage and irregular heat beat.  Alternatives to this procedure were also discussed.  PICC/Midline Placement Documentation  PICC / Midline Single Lumen [id number] PICC Left Basilic 46 cm 0 cm (Active)  Indication for Insertion or Continuance of Line Home intravenous therapies (PICC only) 01/05/2015  9:00 AM  Exposed Catheter (cm) 0 cm 01/05/2015  9:00 AM  Site Assessment Clean;Dry;Intact 01/05/2015  9:00 AM  Line Status Flushed;Saline locked;Blood return noted 01/05/2015  9:00 AM  Dressing Type Transparent 01/05/2015  9:00 AM  Dressing Status Clean;Dry;Intact;Antimicrobial disc in place 01/05/2015  9:00 AM  Line Care Connections checked and tightened 01/05/2015  9:00 AM  Line Adjustment (NICU/IV Team Only) No 01/05/2015  9:00 AM  Dressing Intervention New dressing 01/05/2015  9:00 AM  Dressing Change Due 01/12/15 01/05/2015  9:00 AM       Rolena Infante 01/05/2015, 9:01 AM

## 2015-01-05 NOTE — Clinical Social Work Note (Signed)
CSW consult acknowledged:  Clinical Education officer, museum received consult for SNF placement. PT currently recommending Home Health. CSW will sign off for now as social work intervention is no longer needed. Please consult [place] again if new need arises.  Glendon Axe, MSW, LCSWA [phone] 01/05/2015 8:36 AM

## 2015-01-05 NOTE — Progress Notes (Signed)
Physical Therapy Treatment Patient Details Name: Eddie Fletcher MRN: [id number] DOB: [birth year] Today's Date: 01/05/2015    History of Present Illness Pt is a 59 y/o M admitted w/ infected Rt TKA and now revision of Rt TKA.  Pt's PMH includes Bsnier-Boeck disease, HTN, edema, obesity, cellulitis, Bil carpal tunnel release, Rt thumb trigger finger.    PT Comments    Pt agreeable to participate in PT session.  Progressing but slowly, limited by fatigue & pain in both back & Rt knee however pt reports limited more from back pain.  Cont with current POC to maximize functional mobility & safety prior to d/c home.     Follow Up Recommendations  Home health PT;Supervision for mobility/OOB     Equipment Recommendations  3in1 (PT)    Recommendations for Other Services OT consult     Precautions / Restrictions Precautions Precautions: Knee;Fall Precaution Comments: Reviewed knee precautions Required Braces or Orthoses: Knee Immobilizer - Right Restrictions RLE Weight Bearing: Weight bearing as tolerated    Mobility  Bed Mobility Overal bed mobility: Needs Assistance Bed Mobility: Supine to Sit     Supine to sit: Min guard     General bed mobility comments: heavy reliance on rails to push up to sitting  Transfers Overall transfer level: Needs assistance Equipment used: Rolling walker (2 wheeled) Transfers: Sit to/from Stand Sit to Stand: Min assist         General transfer comment: Min assist to power up to standing.  Cues to stand upright.  Ambulation/Gait Ambulation/Gait assistance: Min guard Ambulation Distance (Feet): 15 Feet Assistive device: Rolling walker (2 wheeled) Gait Pattern/deviations: Step-to pattern;Decreased step length - right;Decreased step length - left;Trunk flexed   Gait velocity interpretation: Below normal speed for age/gender General Gait Details: cues for posture, sequencing, body positioning inside RW   Stairs             Wheelchair Mobility    Modified Rankin (Stroke Patients Only)       Balance                                    Cognition Arousal/Alertness: Awake/alert Behavior During Therapy: WFL for tasks assessed/performed Overall Cognitive Status: Within Functional Limits for tasks assessed                      Exercises Total Joint Exercises Ankle Circles/Pumps: AROM;Both;10 reps Quad Sets: AROM;Both;10 reps Straight Leg Raises: AAROM;Strengthening;Right;5 reps;Supine    General Comments        Pertinent Vitals/Pain Pain Assessment: 0-10 Pain Score: 5  Pain Location: rt knee & back Pain Descriptors / Indicators: Aching Pain Intervention(s): Limited activity within patient's tolerance;Monitored during session;Repositioned    Home Living                      Prior Function            PT Goals (current goals can now be found in the care plan section) Acute Rehab PT Goals Patient Stated Goal: to return home, does not want to go back to a SNF PT Goal Formulation: With patient Time For Goal Achievement: 01/11/15 Potential to Achieve Goals: Good Progress towards PT goals: Progressing toward goals    Frequency  7X/week    PT Plan Current plan remains appropriate    Co-evaluation  End of Session Equipment Utilized During Treatment: Right knee immobilizer Activity Tolerance: Patient limited by pain;Patient limited by fatigue Patient left: in chair;with call bell/phone within reach     Time: 1010-1028 PT Time Calculation (min) (ACUTE ONLY): 18 min  Charges:  $Gait Training: 8-22 mins                    G Codes:      Sena Hitch 01/05/2015, 12:00 PM   Sarajane Marek, PTA [phone] 01/05/2015

## 2015-01-05 NOTE — Progress Notes (Signed)
Subjective: Patient stable seems to be managing this hospitalization well   Objective: Vital signs in last 24 hours: Temp:  [98.2 F (36.8 C)-99.5 F (37.5 C)] 99.1 F (37.3 C) (08/28 0549) Pulse Rate:  [109-113] 111 (08/28 0549) Resp:  [16-18] 18 (08/28 0549) BP: (110-124)/(50-97) 110/56 mmHg (08/28 0549) SpO2:  [91 %-96 %] 91 % (08/28 0549)  Intake/Output from previous day: 08/27 0701 - 08/28 0700 In: -  Out: 355 [Urine:325; Drains:30] Intake/Output this shift:    Exam:  Sensation intact distally Intact pulses distally Incisional VAC in place  Labs:  Recent Labs  01/03/15 1140  HGB 13.9    Recent Labs  01/03/15 1140  WBC 5.2  RBC 4.79  HCT 40.6  PLT 234    Recent Labs  01/03/15 1140  NA 136  K 3.7  CL 99*  CO2 29  BUN 18  CREATININE 0.98  GLUCOSE 122*  CALCIUM 9.0    Recent Labs  01/03/15 1140  INR 1.11    Assessment/Plan: PICC line has been placed IV anabiotic's to be determined in terms of type and duration anticipate discharge tomorrow or Tuesday   DEAN,GREGORY SCOTT 01/05/2015, 9:34 AM

## 2015-01-05 NOTE — Progress Notes (Signed)
ANTIBIOTIC CONSULT NOTE - INITIAL  Pharmacy Consult for Vancomycin Indication: septic joint  Allergies  Allergen Reactions  . Lisinopril Cough  . Keflex [Cephalexin] Rash  . Sulfa Antibiotics Rash    Patient Measurements: Height: 5\' 8"  (172.7 cm) Weight: (!) 323 lb (146.512 kg) IBW/kg (Calculated) : 68.4 Adjusted Body Weight:   Vital Signs: BP: 144/98 mmHg (08/28 1300) Pulse Rate: 103 (08/28 1300) Intake/Output from previous day: 08/27 0701 - 08/28 0700 In: -  Out: 355 [Urine:325; Drains:30] Intake/Output from this shift:    Labs:  Recent Labs  01/03/15 1140  WBC 5.2  HGB 13.9  PLT 234  CREATININE 0.98   Estimated Creatinine Clearance: 115.7 mL/min (by C-G formula based on Cr of 0.98).  Recent Labs  01/05/15 1820  VANCOTROUGH 39*     Microbiology: Recent Results (from the past 720 hour(s))  Anaerobic culture     Status: None (Preliminary result)   Collection Time: 01/03/15  3:54 PM  Result Value Ref Range Status   Specimen Description TISSUE RIGHT KNEE  Final   Special Requests SPEC A  Final   Gram Stain   Final    FEW WBC PRESENT,BOTH PMN AND MONONUCLEAR NO ORGANISMS SEEN Performed at [facility]    Culture   Final    NO ANAEROBES ISOLATED; CULTURE IN PROGRESS FOR 5 DAYS Performed at [facility]    Report Status PENDING  Incomplete  Tissue culture     Status: None (Preliminary result)   Collection Time: 01/03/15  3:58 PM  Result Value Ref Range Status   Specimen Description TISSUE RIGHT KNEE  Final   Special Requests SPEC A  Final   Gram Stain   Final    NO WBC SEEN NO ORGANISMS SEEN Performed at [facility]    Culture   Final    FEW STAPHYLOCOCCUS SPECIES Performed at [facility]    Report Status PENDING  Incomplete  Tissue culture     Status: None (Preliminary result)   Collection Time: 01/03/15  4:05 PM  Result Value Ref Range Status   Specimen Description TISSUE RIGHT KNEE  Final   Special  Requests SPEC B  Final   Gram Stain   Final    NO WBC SEEN NO ORGANISMS SEEN Performed at [facility]    Culture   Final    NO GROWTH 1 DAY Performed at [facility]    Report Status PENDING  Incomplete    Medical History: Past Medical History  Diagnosis Date  . Edema     Leg  . Obesity   . Chest discomfort     15 years ago; had negative testing and told it was related to stress  . Diaphoresis   . Cellulitis   . GERD (gastroesophageal reflux disease)   . Cardiomegaly   . Hypertension   . Sleep apnea     cpap 3 yrs-not used for 3-4 months  . SOB (shortness of breath)     occ with exersion  . History of kidney stones   . Arthritis   . Wears glasses   . Seasonal allergies   . Complication of anesthesia     blood pressure - low post op    Medications:  Prescriptions prior to admission  Medication Sig Dispense Refill Last Dose  . albuterol (PROVENTIL HFA;VENTOLIN HFA) 108 (90 BASE) MCG/ACT inhaler Inhale 1-2 puffs into the lungs every 6 (six) hours as needed for wheezing or shortness of  breath.   Past Month at Unknown time  . cholecalciferol (VITAMIN D) 1000 UNITS tablet Take 1,000 Units by mouth daily.   01/02/2015 at Unknown time  . cyclobenzaprine (FLEXERIL) 10 MG tablet Take 10 mg by mouth 3 (three) times daily as needed for muscle spasms.   Past Week at Unknown time  . doxycycline (VIBRAMYCIN) 100 MG capsule Take 100 mg by mouth 2 (two) times daily.   01/02/2015 at Unknown time  . fluticasone (FLONASE) 50 MCG/ACT nasal spray Place 1 spray into both nostrils daily.   01/03/2015 at Unknown time  . hydrochlorothiazide (MICROZIDE) 12.5 MG capsule Take 12.5 mg by mouth daily.   01/02/2015 at Unknown time  . ibuprofen (ADVIL,MOTRIN) 800 MG tablet Take 800 mg by mouth 2 (two) times daily as needed for mild pain.   01/01/2015  . omeprazole (PRILOSEC) 20 MG capsule Take 20 mg by mouth daily.   01/03/2015 at Unknown time  . tamsulosin (FLOMAX) 0.4 MG CAPS capsule  Take 0.4 mg by mouth at bedtime.   01/02/2015 at Unknown time  . valsartan (DIOVAN) 160 MG tablet Take 160 mg by mouth daily.   01/02/2015 at Unknown time  . [place] Hazel (PREPARATION H EX) Apply 1 application topically as needed (for pain).   01/02/2015 at Unknown time  . aspirin EC 325 MG tablet Take 1 tablet (325 mg total) by mouth daily. (Patient not taking: Reported on 01/02/2015) 30 tablet 0   . methocarbamol (ROBAXIN) 500 MG tablet Take 1 tablet (500 mg total) by mouth 3 (three) times daily. (Patient not taking: Reported on 01/02/2015) 30 tablet 0   . oxyCODONE-acetaminophen (ROXICET) 5-325 MG per tablet Take one tablet by mouth every 4 hours as needed for severe pain (Patient not taking: Reported on 01/02/2015) 180 tablet 0    Scheduled:  . aspirin EC  325 mg Oral Daily  . cholecalciferol  1,000 Units Oral Daily  . docusate sodium  100 mg Oral BID  . fluticasone  1 spray Each Nare Daily  . hydrochlorothiazide  12.5 mg Oral Daily  . irbesartan  37.5 mg Oral Daily  . pantoprazole  40 mg Oral Daily  . sodium chloride  10-40 mL Intracatheter Q12H  . tamsulosin  0.4 mg Oral QHS   Infusions:  . sodium chloride 10 mL/hr at 01/05/15 1046   Assessment: 59yo male was started on vancomycin 1g IV q12h for a septic R TKA. Pharmacy checked a vancomycin trough prior to fourth dose. VT is SUPRAtherapeutic at 39 mcg/mL. Pt is afebrile, WBC wnl, sCr 0.98.  Goal of Therapy:  Vancomycin trough level 15-20 mcg/ml  Plan:  Hold vancomycin dose Check vancomycin random level in 24h Measure antibiotic drug levels at steady state Follow up culture results, renal function and clinical course  Andrey Cota. Diona Foley, PharmD Clinical Pharmacist Pager [phone] 01/05/2015,7:52 PM

## 2015-01-06 ENCOUNTER — Encounter (HOSPITAL_COMMUNITY): Payer: Self-pay | Admitting: Orthopedic Surgery

## 2015-01-06 LAB — TISSUE CULTURE: GRAM STAIN: NONE SEEN

## 2015-01-06 LAB — VANCOMYCIN, RANDOM: VANCOMYCIN RM: 29 ug/mL

## 2015-01-06 MED ORDER — RIFAMPIN 300 MG PO CAPS: 300.0000 mg | ORAL_CAPSULE | Freq: Two times a day (BID) | ORAL | Status: DC

## 2015-01-06 MED ORDER — OXYCODONE-ACETAMINOPHEN 5-325 MG PO TABS: 1.0000 | ORAL_TABLET | ORAL | Status: DC | PRN

## 2015-01-06 MED ORDER — SODIUM CHLORIDE 0.9 % IV SOLN: 1000.0000 mg | INTRAVENOUS | Status: DC

## 2015-01-06 MED ORDER — HEPARIN SOD (PORK) LOCK FLUSH 100 UNIT/ML IV SOLN
250.0000 [IU] | INTRAVENOUS | Status: AC | PRN
  Administered 2015-01-06: 250 [IU]

## 2015-01-06 MED ORDER — VANCOMYCIN HCL 1000 MG IV SOLR: 1000.0000 mg | INTRAVENOUS | Status: DC

## 2015-01-06 NOTE — Care Management Note (Signed)
Case Management Note  Patient Details  Name: Eddie Fletcher MRN: [id number] Date of Birth: Mar 19, 1956  Subjective/Objective:     Septic knee joint               Action/Plan: Spoke with patient about home equipment, he has decided that he would like a 3N1, contacted Jeneen Rinks with [facility] and requested 3N1 be delivered to patient's room. Patient states that he has a rolling walker at home. [facility] set up for home IV vancomycin,HHRN and HHPT. Patient's home VAC is in his room.       Expected Discharge Date:                  Expected Discharge Plan:  [place]  In-House Referral:  NA  Discharge planning Services  CM Consult  Post Acute Care Choice:  Home Health, Durable Medical Equipment Choice offered to:  Patient  DME Arranged:  3-N-1 DME Agency:  [place]:  RN, PT [facility] Agency:  [place]  Status of Service:  Completed, signed off  Medicare Important Message Given:    Date Medicare IM Given:    Medicare IM give by:    Date Additional Medicare IM Given:    Additional Medicare Important Message give by:     If discussed at [place] of Stay Meetings, dates discussed:    Additional Comments:  Nila Nephew, RN 01/06/2015, 11:26 AM

## 2015-01-06 NOTE — Progress Notes (Signed)
Physical Therapy Treatment Patient Details Name: Eddie Fletcher MRN: [id number] DOB: 19-Feb-1956 Today's Date: 01/06/2015    History of Present Illness Pt is a 59 y/o M admitted w/ infected Rt TKA and now revision of Rt TKA.  Pt's PMH includes Bsnier-Boeck disease, HTN, edema, obesity, cellulitis, Bil carpal tunnel release, Rt thumb trigger finger.    PT Comments    Eddie Fletcher is making good progress w/ therapy although he still has difficulty performing sit>stand from low surfaces.  Pt will benefit from continued skilled PT services to increase functional independence and safety.  Follow Up Recommendations  Home health PT;Supervision for mobility/OOB     Equipment Recommendations  3in1 (PT)    Recommendations for Other Services       Precautions / Restrictions Precautions Precautions: Knee;Fall Precaution Comments: Reviewed knee precautions Required Braces or Orthoses: Knee Immobilizer - Right Restrictions Weight Bearing Restrictions: Yes RLE Weight Bearing: Weight bearing as tolerated    Mobility  Bed Mobility               General bed mobility comments: Pt sitting EOB upon PT arrival  Transfers Overall transfer level: Needs assistance Equipment used: Rolling walker (2 wheeled) Transfers: Sit to/from Stand Sit to Stand: Min assist         General transfer comment: Cues for hand placement on armrests when rising from chair, pt has difficulty rising from low surfaces and encouraged pt and pt's wife to ensure pt sits in higher chair/couch at home.   Ambulation/Gait Ambulation/Gait assistance: Min guard Ambulation Distance (Feet): 75 Feet (50, 25) Assistive device: Rolling walker (2 wheeled) Gait Pattern/deviations: Step-to pattern;Step-through pattern;Antalgic;Trunk flexed;Decreased weight shift to right;Decreased stride length;Decreased stance time - right Gait velocity: decreased Gait velocity interpretation: Below normal speed for age/gender General  Gait Details: Cues to stand upright and look ahead.  Pt w/ back pain at baseline which prevents him from standing up straight.  1 sitting rest break after ambulating 50 ft 2/2 fatigue and pain in Lt knee (says he has arthritis in this knee as well).   Stairs            Wheelchair Mobility    Modified Rankin (Stroke Patients Only)       Balance Overall balance assessment: Needs assistance Sitting-balance support: Bilateral upper extremity supported;Feet supported Sitting balance-Leahy Scale: Fair     Standing balance support: Bilateral upper extremity supported;During functional activity Standing balance-Leahy Scale: Poor Standing balance comment: Relies heavily on RW for support                    Cognition Arousal/Alertness: Awake/alert Behavior During Therapy: WFL for tasks assessed/performed Overall Cognitive Status: Within Functional Limits for tasks assessed                      Exercises Total Joint Exercises Ankle Circles/Pumps: AROM;Both;10 reps;Seated Quad Sets: Both;10 reps;Strengthening;Seated Straight Leg Raises: AAROM;Right;5 reps;Seated    General Comments        Pertinent Vitals/Pain Pain Assessment: 0-10 Pain Score: 4  Pain Location: Rt knee Pain Descriptors / Indicators: Aching;Heaviness Pain Intervention(s): Limited activity within patient's tolerance;Monitored during session;Repositioned    Home Living                      Prior Function            PT Goals (current goals can now be found in the care plan section) Acute Rehab PT Goals Patient  Stated Goal: to return home, does not want to go back to a SNF PT Goal Formulation: With patient Time For Goal Achievement: 01/11/15 Potential to Achieve Goals: Good Progress towards PT goals: Progressing toward goals    Frequency  7X/week    PT Plan Current plan remains appropriate    Co-evaluation             End of Session Equipment Utilized During  Treatment: Right knee immobilizer Activity Tolerance: Patient limited by fatigue;Patient limited by pain Patient left: with call bell/phone within reach;with family/visitor present;in chair     Time: 1206-1229 PT Time Calculation (min) (ACUTE ONLY): 23 min  Charges:  $Gait Training: 8-22 mins $Therapeutic Exercise: 8-22 mins                    G Codes:      Joslyn Hy PT, DPT [phone] Pager: [phone] 01/06/2015, 1:19 PM

## 2015-01-06 NOTE — Discharge Summary (Signed)
Physician Discharge Summary  Patient ID: Eddie Fletcher MRN: [id number] DOB/AGE: 1955/07/22 59 y.o.  Admit date: 01/03/2015 Discharge date: 01/06/2015  Admission Diagnoses: Septic right total knee  Discharge Diagnoses:  Active Problems:   Septic joint of left knee joint   Discharged Condition: stable  Hospital Course: Patient is a 59 year old gentleman who presents in follow-up status post exchange the polyethylene tray and placement of antibiotics beads right knee for septic joint. Patient was placed on a PICC line and placed on IV vancomycin. Plan for discharge to home on IV vancomycin.  Consults: None  Significant Diagnostic Studies: labs: Routine labs  Treatments: surgery: See operative note  Discharge Exam: Blood pressure 94/46, pulse 90, temperature 98.6 F (37 C), temperature source Oral, resp. rate 16, height 5\' 8"  (1.727 m), weight 146.512 kg (323 lb), SpO2 98 %. Incision/Wound: incision clean and dry.  Disposition: 03-Skilled Nursing Facility  Discharge Instructions    Call MD / Call 911    Complete by:  As directed   If you experience chest pain or shortness of breath, CALL 911 and be transported to the hospital emergency room.  If you develope a fever above 101 F, pus (white drainage) or increased drainage or redness at the wound, or calf pain, call your surgeon's office.     Constipation Prevention    Complete by:  As directed   Drink plenty of fluids.  Prune juice may be helpful.  You may use a stool softener, such as Colace (over the counter) 100 mg twice a day.  Use MiraLax (over the counter) for constipation as needed.     Diet - low sodium heart healthy    Complete by:  As directed      Increase activity slowly as tolerated    Complete by:  As directed      Weight bearing as tolerated    Complete by:  As directed             Medication List    STOP taking these medications        doxycycline 100 MG capsule  Commonly known as:  VIBRAMYCIN       TAKE these medications        albuterol 108 (90 BASE) MCG/ACT inhaler  Commonly known as:  PROVENTIL HFA;VENTOLIN HFA  Inhale 1-2 puffs into the lungs every 6 (six) hours as needed for wheezing or shortness of breath.     aspirin EC 325 MG tablet  Take 1 tablet (325 mg total) by mouth daily.     cholecalciferol 1000 UNITS tablet  Commonly known as:  VITAMIN D  Take 1,000 Units by mouth daily.     cyclobenzaprine 10 MG tablet  Commonly known as:  FLEXERIL  Take 10 mg by mouth 3 (three) times daily as needed for muscle spasms.     fluticasone 50 MCG/ACT nasal spray  Commonly known as:  FLONASE  Place 1 spray into both nostrils daily.     hydrochlorothiazide 12.5 MG capsule  Commonly known as:  MICROZIDE  Take 12.5 mg by mouth daily.     ibuprofen 800 MG tablet  Commonly known as:  ADVIL,MOTRIN  Take 800 mg by mouth 2 (two) times daily as needed for mild pain.     methocarbamol 500 MG tablet  Commonly known as:  ROBAXIN  Take 1 tablet (500 mg total) by mouth 3 (three) times daily.     omeprazole 20 MG capsule  Commonly known as:  PRILOSEC  Take 20 mg by mouth daily.     oxyCODONE-acetaminophen 5-325 MG per tablet  Commonly known as:  ROXICET  Take one tablet by mouth every 4 hours as needed for severe pain     oxyCODONE-acetaminophen 5-325 MG per tablet  Commonly known as:  ROXICET  Take 1 tablet by mouth every 4 (four) hours as needed for severe pain.     PREPARATION H EX  Apply 1 application topically as needed (for pain).     tamsulosin 0.4 MG Caps capsule  Commonly known as:  FLOMAX  Take 0.4 mg by mouth at bedtime.     valsartan 160 MG tablet  Commonly known as:  DIOVAN  Take 160 mg by mouth daily.     vancomycin 1,000 mg in sodium chloride 0.9 % 100 mL  Inject 1,000 mg into the vein daily.           Follow-up Information    Follow up with [facility].   Why:  Someone from [facility] will contact you concerning start date  and time for Home Health RN and therapist.   Contact information:   [address] [place] [address] [phone]       Follow up with DUDA,MARCUS V, MD In 1 week.   Specialty:  Orthopedic Surgery   Contact information:   [place] [address] [phone]       Signed: Newt Minion 01/06/2015, 6:41 AM

## 2015-01-07 LAB — TISSUE CULTURE
Culture: NO GROWTH
GRAM STAIN: NONE SEEN

## 2015-01-08 ENCOUNTER — Inpatient Hospital Stay (HOSPITAL_COMMUNITY)
Admission: EM | Admit: 2015-01-08 | Discharge: 2015-01-12 | DRG: 559 | Disposition: A | Discharge status: Home Health | Payer: Managed Care, Other (non HMO) | Attending: Internal Medicine | Admitting: Internal Medicine

## 2015-01-08 ENCOUNTER — Encounter (HOSPITAL_COMMUNITY): Payer: Self-pay | Admitting: Family Medicine

## 2015-01-08 DIAGNOSIS — N179 Acute kidney failure, unspecified: Secondary | ICD-10-CM | POA: Insufficient documentation

## 2015-01-08 DIAGNOSIS — M009 Pyogenic arthritis, unspecified: Secondary | ICD-10-CM | POA: Diagnosis not present

## 2015-01-08 DIAGNOSIS — R7989 Other specified abnormal findings of blood chemistry: Secondary | ICD-10-CM

## 2015-01-08 DIAGNOSIS — Z9119 Patient's noncompliance with other medical treatment and regimen: Secondary | ICD-10-CM | POA: Diagnosis present

## 2015-01-08 DIAGNOSIS — A4101 Sepsis due to Methicillin susceptible Staphylococcus aureus: Secondary | ICD-10-CM

## 2015-01-08 DIAGNOSIS — A419 Sepsis, unspecified organism: Secondary | ICD-10-CM | POA: Diagnosis not present

## 2015-01-08 DIAGNOSIS — Z96659 Presence of unspecified artificial knee joint: Secondary | ICD-10-CM

## 2015-01-08 DIAGNOSIS — D62 Acute posthemorrhagic anemia: Secondary | ICD-10-CM | POA: Diagnosis present

## 2015-01-08 DIAGNOSIS — Z7982 Long term (current) use of aspirin: Secondary | ICD-10-CM | POA: Diagnosis not present

## 2015-01-08 DIAGNOSIS — Z79891 Long term (current) use of opiate analgesic: Secondary | ICD-10-CM | POA: Diagnosis not present

## 2015-01-08 DIAGNOSIS — Z8249 Family history of ischemic heart disease and other diseases of the circulatory system: Secondary | ICD-10-CM | POA: Diagnosis not present

## 2015-01-08 DIAGNOSIS — N170 Acute kidney failure with tubular necrosis: Secondary | ICD-10-CM | POA: Diagnosis present

## 2015-01-08 DIAGNOSIS — B9561 Methicillin susceptible Staphylococcus aureus infection as the cause of diseases classified elsewhere: Secondary | ICD-10-CM | POA: Diagnosis not present

## 2015-01-08 DIAGNOSIS — T8450XD Infection and inflammatory reaction due to unspecified internal joint prosthesis, subsequent encounter: Secondary | ICD-10-CM | POA: Diagnosis not present

## 2015-01-08 DIAGNOSIS — Z6841 Body Mass Index (BMI) 40.0 and over, adult: Secondary | ICD-10-CM | POA: Diagnosis not present

## 2015-01-08 DIAGNOSIS — R739 Hyperglycemia, unspecified: Secondary | ICD-10-CM | POA: Diagnosis present

## 2015-01-08 DIAGNOSIS — Z888 Allergy status to other drugs, medicaments and biological substances status: Secondary | ICD-10-CM | POA: Diagnosis not present

## 2015-01-08 DIAGNOSIS — R6521 Severe sepsis with septic shock: Secondary | ICD-10-CM | POA: Diagnosis present

## 2015-01-08 DIAGNOSIS — R945 Abnormal results of liver function studies: Secondary | ICD-10-CM

## 2015-01-08 DIAGNOSIS — R609 Edema, unspecified: Secondary | ICD-10-CM | POA: Diagnosis not present

## 2015-01-08 DIAGNOSIS — G4733 Obstructive sleep apnea (adult) (pediatric): Secondary | ICD-10-CM | POA: Diagnosis present

## 2015-01-08 DIAGNOSIS — T8459XA Infection and inflammatory reaction due to other internal joint prosthesis, initial encounter: Secondary | ICD-10-CM

## 2015-01-08 DIAGNOSIS — Z96651 Presence of right artificial knee joint: Secondary | ICD-10-CM | POA: Diagnosis present

## 2015-01-08 DIAGNOSIS — T8453XA Infection and inflammatory reaction due to internal right knee prosthesis, initial encounter: Secondary | ICD-10-CM | POA: Diagnosis not present

## 2015-01-08 DIAGNOSIS — T8450XS Infection and inflammatory reaction due to unspecified internal joint prosthesis, sequela: Secondary | ICD-10-CM | POA: Diagnosis not present

## 2015-01-08 DIAGNOSIS — Z79899 Other long term (current) drug therapy: Secondary | ICD-10-CM | POA: Diagnosis not present

## 2015-01-08 DIAGNOSIS — K5900 Constipation, unspecified: Secondary | ICD-10-CM | POA: Diagnosis present

## 2015-01-08 DIAGNOSIS — I959 Hypotension, unspecified: Secondary | ICD-10-CM | POA: Diagnosis not present

## 2015-01-08 DIAGNOSIS — I10 Essential (primary) hypertension: Secondary | ICD-10-CM | POA: Diagnosis present

## 2015-01-08 DIAGNOSIS — Y838 Other surgical procedures as the cause of abnormal reaction of the patient, or of later complication, without mention of misadventure at the time of the procedure: Secondary | ICD-10-CM | POA: Diagnosis not present

## 2015-01-08 DIAGNOSIS — I517 Cardiomegaly: Secondary | ICD-10-CM | POA: Diagnosis present

## 2015-01-08 DIAGNOSIS — K219 Gastro-esophageal reflux disease without esophagitis: Secondary | ICD-10-CM | POA: Diagnosis present

## 2015-01-08 DIAGNOSIS — Z881 Allergy status to other antibiotic agents status: Secondary | ICD-10-CM

## 2015-01-08 DIAGNOSIS — R509 Fever, unspecified: Secondary | ICD-10-CM | POA: Diagnosis not present

## 2015-01-08 DIAGNOSIS — Y831 Surgical operation with implant of artificial internal device as the cause of abnormal reaction of the patient, or of later complication, without mention of misadventure at the time of the procedure: Secondary | ICD-10-CM | POA: Diagnosis present

## 2015-01-08 HISTORY — DX: Polyp of colon: K63.5

## 2015-01-08 HISTORY — DX: Unspecified hemorrhoids: K64.9

## 2015-01-08 LAB — COMPREHENSIVE METABOLIC PANEL
ALBUMIN: 2.7 g/dL — AB (ref 3.5–5.0)
ALK PHOS: 103 U/L (ref 38–126)
ALT: 30 U/L (ref 17–63)
AST: 44 U/L — AB (ref 15–41)
Anion gap: 11 (ref 5–15)
BILIRUBIN TOTAL: 1.4 mg/dL — AB (ref 0.3–1.2)
BUN: 61 mg/dL — AB (ref 6–20)
CALCIUM: 10.3 mg/dL (ref 8.9–10.3)
CO2: 24 mmol/L (ref 22–32)
Chloride: 101 mmol/L (ref 101–111)
Creatinine, Ser: 2.6 mg/dL — ABNORMAL HIGH (ref 0.61–1.24)
GFR calc Af Amer: 30 mL/min — ABNORMAL LOW (ref >60)
GFR calc non Af Amer: 26 mL/min — ABNORMAL LOW (ref >60)
GLUCOSE: 111 mg/dL — AB (ref 65–99)
Potassium: 4.2 mmol/L (ref 3.5–5.1)
Sodium: 136 mmol/L (ref 135–145)
TOTAL PROTEIN: 8.4 g/dL — AB (ref 6.5–8.1)

## 2015-01-08 LAB — CBC
HEMATOCRIT: 33.4 % — AB (ref 39.0–52.0)
Hemoglobin: 11.4 g/dL — ABNORMAL LOW (ref 13.0–17.0)
MCH: 28.6 pg (ref 26.0–34.0)
MCHC: 34.1 g/dL (ref 30.0–36.0)
MCV: 83.7 fL (ref 78.0–100.0)
Platelets: 219 10*3/uL (ref 150–400)
RBC: 3.99 MIL/uL — ABNORMAL LOW (ref 4.22–5.81)
RDW: 13.7 % (ref 11.5–15.5)
WBC: 6.8 10*3/uL (ref 4.0–10.5)

## 2015-01-08 LAB — URINALYSIS, ROUTINE W REFLEX MICROSCOPIC
Bilirubin Urine: NEGATIVE
GLUCOSE, UA: NEGATIVE mg/dL
HGB URINE DIPSTICK: NEGATIVE
Ketones, ur: NEGATIVE mg/dL
Nitrite: NEGATIVE
Protein, ur: NEGATIVE mg/dL
SPECIFIC GRAVITY, URINE: 1.017 (ref 1.005–1.030)
UROBILINOGEN UA: 0.2 mg/dL (ref 0.0–1.0)
pH: 5 (ref 5.0–8.0)

## 2015-01-08 LAB — ANAEROBIC CULTURE

## 2015-01-08 LAB — DIFFERENTIAL
Basophils Absolute: 0 10*3/uL (ref 0.0–0.1)
Basophils Relative: 0 % (ref 0–1)
EOS PCT: 5 % (ref 0–5)
Eosinophils Absolute: 0.4 10*3/uL (ref 0.0–0.7)
LYMPHS PCT: 10 % — AB (ref 12–46)
Lymphs Abs: 0.7 10*3/uL (ref 0.7–4.0)
MONO ABS: 1.3 10*3/uL — AB (ref 0.1–1.0)
MONOS PCT: 17 % — AB (ref 3–12)
Neutro Abs: 5 10*3/uL (ref 1.7–7.7)
Neutrophils Relative %: 68 % (ref 43–77)

## 2015-01-08 LAB — CBG MONITORING, ED: Glucose-Capillary: 97 mg/dL (ref 65–99)

## 2015-01-08 LAB — URINE MICROSCOPIC-ADD ON

## 2015-01-08 LAB — SEDIMENTATION RATE: SED RATE: 93 mm/h — AB (ref 0–16)

## 2015-01-08 LAB — I-STAT CG4 LACTIC ACID, ED: LACTIC ACID, VENOUS: 1.07 mmol/L (ref 0.5–2.0)

## 2015-01-08 LAB — C-REACTIVE PROTEIN: CRP: 17 mg/dL — ABNORMAL HIGH (ref <1.0)

## 2015-01-08 LAB — CREATININE, URINE, RANDOM: Creatinine, Urine: 151.77 mg/dL

## 2015-01-08 MED ORDER — SODIUM CHLORIDE 0.9 % IV BOLUS (SEPSIS): 2000.0000 mL | Freq: Once | INTRAVENOUS | Status: DC

## 2015-01-08 MED ORDER — SODIUM CHLORIDE 0.9 % IV BOLUS (SEPSIS)
1000.0000 mL | Freq: Once | INTRAVENOUS | Status: AC
  Administered 2015-01-08: 1000 mL via INTRAVENOUS

## 2015-01-08 NOTE — H&P (Addendum)
[facility] History and Physical  Eddie Fletcher [id number] DOB: 1955/06/10 DOA: 01/08/2015  Referring physician: Ms. Delena Bali, PCP: Curly Rim, MD  Specialists: Dr.Duda.  Chief Complaint: Renal failure.  HPI: Eddie Fletcher is a 60 y.o. male with history of hypertension, sleep apnea who has had recent septic right knee joint and had revision of the right knee arthroplasty on 01/03/2015 by Dr. Sharol Given and was discharged on vancomycin IVand rifampin was advised to come to the ER after patient was found to have elevated creatinine to 2.6 on routine checking. Patient in the ER was found to be mildly hypotensive and tachycardic but afebrile. Patient denies any nausea vomiting abdominal pain or diarrhea chest pain or productive cough. Patient's vancomycin levels are still pending. Patient's vancomycin was held after levels of the high 2 days ago. Patient already has a PICC line on his left arm. Patient has been given fluid boluses blood cultures obtained and admitted for further management of his acute renal failure and hypotension. Patient is not complaining of any dizziness weakness palpitations. Patient does complain of some pain around the groin area. On exam patient also has significant swelling of his right calf area.  Review of Systems: As presented in the history of presenting illness, rest negative.  Past Medical History  Diagnosis Date  . Edema     Leg  . Obesity   . Chest discomfort     15 years ago; had negative testing and told it was related to stress  . Diaphoresis   . Cellulitis   . GERD (gastroesophageal reflux disease)   . Cardiomegaly   . Hypertension   . Sleep apnea     cpap 3 yrs-not used for 3-4 months  . SOB (shortness of breath)     occ with exersion  . History of kidney stones   . Arthritis   . Wears glasses   . Seasonal allergies   . Complication of anesthesia     blood pressure - low post op   Past Surgical History  Procedure Laterality  Date  . Carpal tunnel release Bilateral 04  . Trigger finger release Right 12    rt thumb  . Knee arthroscopy Right 12  . Total knee arthroplasty Right 01/30/2014    Procedure: Right Total Knee Arthroplasty;  Surgeon: Newt Minion, MD;  Location: [place];  Service: Orthopedics;  Laterality: Right;  . Colonoscopy w/ biopsies and polypectomy    . Total knee revision Right 01/03/2015    Procedure: Revision Right Total Knee Arthroplasty, Poly Exchange, Place Antibiotic Beads;  Surgeon: Newt Minion, MD;  Location: [place];  Service: Orthopedics;  Laterality: Right;  . Application of wound vac Right 01/03/2015    Procedure: APPLICATION OF INCISIONAL  WOUND VAC;  Surgeon: Newt Minion, MD;  Location: [place];  Service: Orthopedics;  Laterality: Right;   Social History:  reports that he has never smoked. He has never used smokeless tobacco. He reports that he does not drink alcohol or use illicit drugs. Where does patient live  Home. Can patient participate in ADLs? Yes.  Allergies  Allergen Reactions  . Keflex [Cephalexin] Rash  . Lisinopril Cough  . Sulfa Antibiotics Rash    Family History:  Family History  Problem Relation Age of Onset  . Peripheral vascular disease Father   . Hypertension Other   . Cancer Other       Prior to Admission medications   Medication Sig Start Date End Date Taking? Authorizing  Provider  albuterol (PROVENTIL HFA;VENTOLIN HFA) 108 (90 BASE) MCG/ACT inhaler Inhale 1-2 puffs into the lungs every 6 (six) hours as needed for wheezing or shortness of breath.   Yes Historical Provider, MD  cholecalciferol (VITAMIN D) 1000 UNITS tablet Take 1,000 Units by mouth daily.   Yes Historical Provider, MD  cyclobenzaprine (FLEXERIL) 10 MG tablet Take 10 mg by mouth 3 (three) times daily as needed for muscle spasms.   Yes Historical Provider, MD  fluticasone (FLONASE) 50 MCG/ACT nasal spray Place 1 spray into both nostrils daily.   Yes Historical Provider, MD   hydrochlorothiazide (MICROZIDE) 12.5 MG capsule Take 12.5 mg by mouth daily.   Yes Historical Provider, MD  ibuprofen (ADVIL,MOTRIN) 800 MG tablet Take 800 mg by mouth 2 (two) times daily as needed for mild pain.   Yes Historical Provider, MD  omeprazole (PRILOSEC) 20 MG capsule Take 20 mg by mouth daily.   Yes Historical Provider, MD  oxyCODONE-acetaminophen (ROXICET) 5-325 MG per tablet Take 1 tablet by mouth every 4 (four) hours as needed for severe pain. 01/06/15  Yes Newt Minion, MD  rifampin (RIFADIN) 300 MG capsule Take 1 capsule (300 mg total) by mouth 2 (two) times daily. 01/06/15  Yes Newt Minion, MD  tamsulosin (FLOMAX) 0.4 MG CAPS capsule Take 0.4 mg by mouth at bedtime.   Yes Historical Provider, MD  valsartan (DIOVAN) 160 MG tablet Take 160 mg by mouth daily.   Yes Historical Provider, MD  aspirin EC 325 MG tablet Take 1 tablet (325 mg total) by mouth daily. Patient not taking: Reported on 01/02/2015 02/01/14   Newt Minion, MD  methocarbamol (ROBAXIN) 500 MG tablet Take 1 tablet (500 mg total) by mouth 3 (three) times daily. Patient not taking: Reported on 01/02/2015 02/01/14   Newt Minion, MD  oxyCODONE-acetaminophen (ROXICET) 5-325 MG per tablet Take one tablet by mouth every 4 hours as needed for severe pain Patient not taking: Reported on 01/02/2015 02/04/14   Tiffany L Reed, DO  vancomycin 1,000 mg in sodium chloride 0.9 % 100 mL Inject 1,000 mg into the vein daily. 01/06/15   Newt Minion, MD    Physical Exam: Filed Vitals:   01/08/15 1826 01/08/15 2145  BP: 94/54 122/61  Pulse: 96 105  Temp: 98.5 F (36.9 C)   Resp: 18 17  SpO2: 97% 99%     General:  Moderately built and nourished.  Eyes: anicteric no pallor.  ENT: no discharge from the ears eyes nose and mouth.  Neck: no mass felt. No JVD appreciated.  Cardiovascular: S1-S2 heard.  Respiratory: no rhonchi or crepitations.  Abdomen: soft nontender bowel sounds present. No guarding or rigidity.  Skin:  right knees is in dressing.  Musculoskeletal: has swelling of the right lower extremity more than the left.  Psychiatric: appears normal.  Neurologic: alert awake oriented to time place and person. Moves all extremities.  Labs on Admission:  Basic Metabolic Panel:  Recent Labs Lab 01/03/15 1140 01/08/15 1841  NA 136 136  K 3.7 4.2  CL 99* 101  CO2 29 24  GLUCOSE 122* 111*  BUN 18 61*  CREATININE 0.98 2.60*  CALCIUM 9.0 10.3   Liver Function Tests:  Recent Labs Lab 01/03/15 1140 01/08/15 1841  AST 35 44*  ALT 32 30  ALKPHOS 106 103  BILITOT 0.8 1.4*  PROT 8.3* 8.4*  ALBUMIN 3.2* 2.7*   No results for input(s): LIPASE, AMYLASE in the last 168 hours. No results  for input(s): AMMONIA in the last 168 hours. CBC:  Recent Labs Lab 01/03/15 1140 01/08/15 1841 01/08/15 2148  WBC 5.2 6.8  --   NEUTROABS  --   --  5.0  HGB 13.9 11.4*  --   HCT 40.6 33.4*  --   MCV 84.8 83.7  --   PLT 234 219  --    Cardiac Enzymes: No results for input(s): CKTOTAL, CKMB, CKMBINDEX, TROPONINI in the last 168 hours.  BNP (last 3 results) No results for input(s): BNP in the last 8760 hours.  ProBNP (last 3 results) No results for input(s): PROBNP in the last 8760 hours.  CBG:  Recent Labs Lab 01/08/15 2232  GLUCAP 97    Radiological Exams on Admission: No results found.  EKG: Independently reviewed. Sinus tachycardia.  Assessment/Plan Active Problems:   AKI (acute kidney injury)   Hypotension   Septic joint of right knee joint   1. Acute renal failure nonoliguric - cause not exactly clear. At this time we are waiting for vancomycin levels. Patient was also hypotensive and is on ARB and hydrochlorothiazide. Check FENa. Patient has so far received 3 L normal saline bolus. Closely follow intake output metabolic panel will also check renal sonogram. For now I'm holding off vancomycin. 2. Hypotension/possible developing sepsis - I have ordered blood cultures  procalcitonin levels lactic acid levels and sepsis protocol. Patients wound culture showed MSSA which was sensitive to clindamycin. For now I'm placing patient on clindamycin and consult infectious disease in a.m. For further recommendations. Continue with hydration and hold antihypertensives.continue rifampin C #4.Patient is not febrile. 3. Recent surgery for right knee septic arthritis status post revision of the total knee arthroplasty - see #2.  Please notify Dr. Sharol Given, orthopedic surgeon in a.m. 4. Mildly elevated LFTs - could be from possible developing sepsis. Patient is also on rifampin so closely monitor LFT for any further worsening and at that point as rifampin may have to be discontinued. 5. Acute blood loss anemia - follow CBC. 6. Hypertension - holding off antihypertensives due to #2. Closely follow blood pressure trends. 7. History of sleep apnea noncompliant with C Pap. 8. Right lower extremity edema - check Dopplers to rule out DVT.  I have reviewed patient's old chart and labs. Patient's chest x-ray is pending.  Addendum - due to persistent low blood pressure but responding to fluids and elevated Procalcitonin I have consulted PCCM.   DVT ProphylaxisLovenox.  Code Status: full code.  Family Communication: patient's wife.  Disposition Plan: admit to inpatient.    Uriel Horkey N. [facility] Pager [phone].  If 7PM-7AM, please contact night-coverage [email or web address] Password [facility] 01/08/2015, 11:33 PM

## 2015-01-08 NOTE — ED Notes (Signed)
IV team approved use of picc line, stating redness around site is from new insertion.

## 2015-01-08 NOTE — Progress Notes (Signed)
ED RN unavailable at present. Will return call.

## 2015-01-08 NOTE — ED Notes (Signed)
Pt here for abnormal labs, sts kidney problems. Pt is on abx for knee infection.

## 2015-01-08 NOTE — ED Notes (Signed)
IV team bedside. 

## 2015-01-08 NOTE — ED Provider Notes (Signed)
CSN: [id number]     Arrival date & time 01/08/15  1820 History   First MD Initiated Contact with Patient 01/08/15 2024     Chief Complaint  Patient presents with  . abnormal labs      (Consider location/radiation/quality/duration/timing/severity/associated sxs/prior Treatment) HPI Comments: Eddie Fletcher is a 59 y.o. male with a PMHx of periph edema, obesity, GERD, cardiomegaly, sleep apnea on CPAP, HTN, exertional SOB, and nephrolithiasis, with a PSHx of total knee arthroplasty 01/03/15 with wound vac application, who presents to the ED with complaints of abnormal labs . He had knee arthroplasty for R septic knee, performed by Dr. Sharol Given on 01/03/15, and had PICC line placed and started on IV vanc. He is not received vancomycin since $RemoveBeforeDE"Sunday, but he has been on PO rifampin for his septic joint. He was called by Dr. Duda's office today due to abnormal labs, stating that his kidney function was abnormal. He has noticed some hematuria since he left the hospital as well as dry mouth. He endorses mild right knee soreness which is improving, he states he was seen at Dr. Duda's office today and his bandage was changed, he did not notice any drainage or erythema, but the bandage currently has some drainage noted. He also reports some constipation since surgery, he is taking laxities. He denies any fevers, chills, chest pain, shortness breath, abdominal pain, nausea, vomiting, diarrhea, rectal pain or bleeding, flank pain, dysuria, numbness, tingling, weakness, lightheadedness, headache, or vision changes.  PCP: [name],KIP A, MD at [facility] ([facility])  Patient is a 59 y.o. male presenting with hematuria. The history is provided by the patient and medical records. No language interpreter was used.  Hematuria This is a new problem. The current episode started in the past 7 days. The problem occurs constantly. The problem has been unchanged. Associated symptoms include arthralgias (R knee soreness).  Pertinent negatives include no abdominal pain, chest pain, chills, fever, myalgias, nausea, numbness, vomiting or weakness. Nothing aggravates the symptoms. He has tried nothing for the symptoms. The treatment provided no relief.    Past Medical History  Diagnosis Date  . Edema     Leg  . Obesity   . Chest discomfort     15"[id number]$  years ago; had negative testing and told it was related to stress  . Diaphoresis   . Cellulitis   . GERD (gastroesophageal reflux disease)   . Cardiomegaly   . Hypertension   . Sleep apnea     cpap 3 yrs-not used for 3-4 months  . SOB (shortness of breath)     occ with exersion  . History of kidney stones   . Arthritis   . Wears glasses   . Seasonal allergies   . Complication of anesthesia     blood pressure - low post op   Past Surgical History  Procedure Laterality Date  . Carpal tunnel release Bilateral 04  . Trigger finger release Right 12    rt thumb  . Knee arthroscopy Right 12  . Total knee arthroplasty Right 01/30/2014    Procedure: Right Total Knee Arthroplasty;  Surgeon: Newt Minion, MD;  Location: [place];  Service: Orthopedics;  Laterality: Right;  . Colonoscopy w/ biopsies and polypectomy    . Total knee revision Right 01/03/2015    Procedure: Revision Right Total Knee Arthroplasty, Poly Exchange, Place Antibiotic Beads;  Surgeon: Newt Minion, MD;  Location: [place];  Service: Orthopedics;  Laterality: Right;  . Application of wound vac  Right 01/03/2015    Procedure: APPLICATION OF INCISIONAL  WOUND VAC;  Surgeon: Newt Minion, MD;  Location: [place];  Service: Orthopedics;  Laterality: Right;   Family History  Problem Relation Age of Onset  . Peripheral vascular disease Father   . Hypertension Other   . Cancer Other    Social History  Substance Use Topics  . Smoking status: Never Smoker   . Smokeless tobacco: Never Used  . Alcohol Use: No     Comment: quit 15 yrs    Review of Systems  Constitutional: Negative for fever and chills.   HENT:       +dry mouth  Eyes: Negative for visual disturbance.  Respiratory: Negative for shortness of breath.   Cardiovascular: Negative for chest pain.  Gastrointestinal: Positive for constipation (since surgery). Negative for nausea, vomiting, abdominal pain, diarrhea, blood in stool and rectal pain.  Genitourinary: Positive for hematuria. Negative for dysuria and flank pain.  Musculoskeletal: Positive for arthralgias (R knee soreness). Negative for myalgias.  Skin: Negative for color change.  Allergic/Immunologic: Negative for immunocompromised state.  Neurological: Negative for weakness, light-headedness and numbness.  Psychiatric/Behavioral: Negative for confusion.   10 Systems reviewed and are negative for acute change except as noted in the HPI.    Allergies  Lisinopril; Keflex; and Sulfa antibiotics  Home Medications   Prior to Admission medications   Medication Sig Start Date End Date Taking? Authorizing Provider  albuterol (PROVENTIL HFA;VENTOLIN HFA) 108 (90 BASE) MCG/ACT inhaler Inhale 1-2 puffs into the lungs every 6 (six) hours as needed for wheezing or shortness of breath.    Historical Provider, MD  aspirin EC 325 MG tablet Take 1 tablet (325 mg total) by mouth daily. Patient not taking: Reported on 01/02/2015 02/01/14   Newt Minion, MD  cholecalciferol (VITAMIN D) 1000 UNITS tablet Take 1,000 Units by mouth daily.    Historical Provider, MD  cyclobenzaprine (FLEXERIL) 10 MG tablet Take 10 mg by mouth 3 (three) times daily as needed for muscle spasms.    Historical Provider, MD  fluticasone (FLONASE) 50 MCG/ACT nasal spray Place 1 spray into both nostrils daily.    Historical Provider, MD  hydrochlorothiazide (MICROZIDE) 12.5 MG capsule Take 12.5 mg by mouth daily.    Historical Provider, MD  ibuprofen (ADVIL,MOTRIN) 800 MG tablet Take 800 mg by mouth 2 (two) times daily as needed for mild pain.    Historical Provider, MD  methocarbamol (ROBAXIN) 500 MG tablet Take 1  tablet (500 mg total) by mouth 3 (three) times daily. Patient not taking: Reported on 01/02/2015 02/01/14   Newt Minion, MD  omeprazole (PRILOSEC) 20 MG capsule Take 20 mg by mouth daily.    Historical Provider, MD  oxyCODONE-acetaminophen (ROXICET) 5-325 MG per tablet Take one tablet by mouth every 4 hours as needed for severe pain Patient not taking: Reported on 01/02/2015 02/04/14   Tiffany L Reed, DO  oxyCODONE-acetaminophen (ROXICET) 5-325 MG per tablet Take 1 tablet by mouth every 4 (four) hours as needed for severe pain. 01/06/15   Newt Minion, MD  rifampin (RIFADIN) 300 MG capsule Take 1 capsule (300 mg total) by mouth 2 (two) times daily. 01/06/15   Newt Minion, MD  tamsulosin (FLOMAX) 0.4 MG CAPS capsule Take 0.4 mg by mouth at bedtime.    Historical Provider, MD  valsartan (DIOVAN) 160 MG tablet Take 160 mg by mouth daily.    Historical Provider, MD  vancomycin 1,000 mg in sodium chloride 0.9 %  100 mL Inject 1,000 mg into the vein daily. 01/06/15   Newt Minion, MD  [place] (PREPARATION H EX) Apply 1 application topically as needed (for pain).    Historical Provider, MD   BP 94/54 mmHg  Pulse 96  Temp(Src) 98.5 F (36.9 C)  Resp 18  SpO2 97% Physical Exam  Constitutional: He is oriented to person, place, and time. Vital signs are normal. He appears well-developed and well-nourished.  Non-toxic appearance. No distress.  Mildly low BP at 94/54 which is consistent with prior visits. Afebrile, nontoxic, NAD. Obese.   HENT:  Head: Normocephalic and atraumatic.  Mouth/Throat: Oropharynx is clear and moist and mucous membranes are normal.  Eyes: Conjunctivae and EOM are normal. Right eye exhibits no discharge. Left eye exhibits no discharge.  Neck: Normal range of motion. Neck supple.  Cardiovascular: Normal rate, regular rhythm, normal heart sounds and intact distal pulses.  Exam reveals no gallop and no friction rub.   No murmur heard. Pulmonary/Chest: Effort normal and breath  sounds normal. No respiratory distress. He has no decreased breath sounds. He has no wheezes. He has no rhonchi. He has no rales.  Abdominal: Soft. Normal appearance and bowel sounds are normal. He exhibits no distension. There is no tenderness. There is no rigidity, no rebound, no guarding, no CVA tenderness, no tenderness at McBurney's point and negative Murphy's sign.  Musculoskeletal:       Right knee: He exhibits decreased range of motion (due to pain), swelling and erythema (trace, around incision).  R knee with midline incision, mild erythema surrounding the incision, with purulent material expressed from incision. Mildly diminished ROM due to pain. +Swelling. Mildly TTP diffusely. Distal strength and sensation grossly intact, distal pulses intact.   Neurological: He is alert and oriented to person, place, and time. He has normal strength. No sensory deficit.  Skin: Skin is warm and dry. No rash noted. There is erythema.  R knee incision and erythema as discussed above  Psychiatric: He has a normal mood and affect.  Nursing note and vitals reviewed.   ED Course  Procedures (including critical care time) Labs Review Labs Reviewed  COMPREHENSIVE METABOLIC PANEL - Abnormal; Notable for the following:    Glucose, Bld 111 (*)    BUN 61 (*)    Creatinine, Ser 2.60 (*)    Total Protein 8.4 (*)    Albumin 2.7 (*)    AST 44 (*)    Total Bilirubin 1.4 (*)    GFR calc non Af Amer 26 (*)    GFR calc Af Amer 30 (*)    All other components within normal limits  CBC - Abnormal; Notable for the following:    RBC 3.99 (*)    Hemoglobin 11.4 (*)    HCT 33.4 (*)    All other components within normal limits  CULTURE, BLOOD (ROUTINE X 2)  CULTURE, BLOOD (ROUTINE X 2)  URINE CULTURE  URINALYSIS, ROUTINE W REFLEX MICROSCOPIC (NOT AT [facility])  VANCOMYCIN, RANDOM  DIFFERENTIAL  SEDIMENTATION RATE  C-REACTIVE PROTEIN  CREATININE, URINE, RANDOM  I-STAT CG4 LACTIC ACID, ED    Imaging Review No  results found. I have personally reviewed and evaluated these images and lab results as part of my medical decision-making.   EKG Interpretation None      MDM   Final diagnoses:  AKI (acute kidney injury)  Septic joint of right knee joint  Hypotension, unspecified hypotension type    59 y.o. male here with abnormal  labs, BUN 61, Cr 2.6. Had TKR on 01/03/15 for septic joint, started on vanc, and sent home. Sent today for AKI, likely from vanc induced injury. R knee with minimal erythema around incision, purulent material expressed from several areas. Will get U/A (which has gross hematuria noted on my exam), and vanc level, ESR/CRP, blood cultures, lactic level, and admit for AKI. Will reassess shortly.   9:05 PM Dr. Hal Hope returning page, will admit pt to med-surg. Please see his notes for further documentation of care. Urine Cr added on per his request.   BP 94/54 mmHg  Pulse 96  Temp(Src) 98.5 F (36.9 C)  Resp 18  SpO2 97%  Meds ordered this encounter  Medications  . sodium chloride 0.9 % bolus 1,000 mL    Sig:      Daxten Kovalenko Camprubi-Soms, PA-C 01/08/15 2106  Carmin Muskrat, MD 01/09/15 [phone]

## 2015-01-08 NOTE — ED Notes (Signed)
Requested sec page provider to notify of low blood pressure, contacted ED provider previously treating pt.

## 2015-01-08 NOTE — ED Notes (Signed)
Attempted to call report

## 2015-01-09 ENCOUNTER — Encounter (HOSPITAL_COMMUNITY): Payer: Self-pay | Admitting: Pulmonary Disease

## 2015-01-09 ENCOUNTER — Inpatient Hospital Stay (HOSPITAL_COMMUNITY): Payer: Managed Care, Other (non HMO)

## 2015-01-09 DIAGNOSIS — R945 Abnormal results of liver function studies: Secondary | ICD-10-CM

## 2015-01-09 DIAGNOSIS — R609 Edema, unspecified: Secondary | ICD-10-CM

## 2015-01-09 DIAGNOSIS — A4101 Sepsis due to Methicillin susceptible Staphylococcus aureus: Secondary | ICD-10-CM

## 2015-01-09 DIAGNOSIS — Y838 Other surgical procedures as the cause of abnormal reaction of the patient, or of later complication, without mention of misadventure at the time of the procedure: Secondary | ICD-10-CM

## 2015-01-09 DIAGNOSIS — Z96659 Presence of unspecified artificial knee joint: Secondary | ICD-10-CM

## 2015-01-09 DIAGNOSIS — R6521 Severe sepsis with septic shock: Secondary | ICD-10-CM

## 2015-01-09 DIAGNOSIS — A419 Sepsis, unspecified organism: Secondary | ICD-10-CM

## 2015-01-09 DIAGNOSIS — N179 Acute kidney failure, unspecified: Secondary | ICD-10-CM | POA: Insufficient documentation

## 2015-01-09 DIAGNOSIS — R509 Fever, unspecified: Secondary | ICD-10-CM

## 2015-01-09 DIAGNOSIS — T8453XA Infection and inflammatory reaction due to internal right knee prosthesis, initial encounter: Principal | ICD-10-CM

## 2015-01-09 DIAGNOSIS — R7989 Other specified abnormal findings of blood chemistry: Secondary | ICD-10-CM

## 2015-01-09 DIAGNOSIS — T8459XA Infection and inflammatory reaction due to other internal joint prosthesis, initial encounter: Secondary | ICD-10-CM

## 2015-01-09 DIAGNOSIS — K5900 Constipation, unspecified: Secondary | ICD-10-CM

## 2015-01-09 DIAGNOSIS — M791 Myalgia: Secondary | ICD-10-CM

## 2015-01-09 DIAGNOSIS — B9561 Methicillin susceptible Staphylococcus aureus infection as the cause of diseases classified elsewhere: Secondary | ICD-10-CM

## 2015-01-09 DIAGNOSIS — T8450XD Infection and inflammatory reaction due to unspecified internal joint prosthesis, subsequent encounter: Secondary | ICD-10-CM

## 2015-01-09 LAB — HEPATIC FUNCTION PANEL
ALK PHOS: 90 U/L (ref 38–126)
ALT: 23 U/L (ref 17–63)
ALT: 26 U/L (ref 17–63)
AST: 34 U/L (ref 15–41)
AST: 36 U/L (ref 15–41)
Albumin: 2.2 g/dL — ABNORMAL LOW (ref 3.5–5.0)
Albumin: 2.5 g/dL — ABNORMAL LOW (ref 3.5–5.0)
Alkaline Phosphatase: 89 U/L (ref 38–126)
BILIRUBIN DIRECT: 0.4 mg/dL (ref 0.1–0.5)
BILIRUBIN DIRECT: 0.6 mg/dL — AB (ref 0.1–0.5)
BILIRUBIN INDIRECT: 0.6 mg/dL (ref 0.3–0.9)
BILIRUBIN TOTAL: 1.2 mg/dL (ref 0.3–1.2)
Indirect Bilirubin: 0.7 mg/dL (ref 0.3–0.9)
TOTAL PROTEIN: 6.3 g/dL — AB (ref 6.5–8.1)
Total Bilirubin: 1.1 mg/dL (ref 0.3–1.2)
Total Protein: 8 g/dL (ref 6.5–8.1)

## 2015-01-09 LAB — CBC WITH DIFFERENTIAL/PLATELET
Basophils Absolute: 0 10*3/uL (ref 0.0–0.1)
Basophils Relative: 0 % (ref 0–1)
Eosinophils Absolute: 0.3 10*3/uL (ref 0.0–0.7)
Eosinophils Relative: 5 % (ref 0–5)
HEMATOCRIT: 29.1 % — AB (ref 39.0–52.0)
Hemoglobin: 9.9 g/dL — ABNORMAL LOW (ref 13.0–17.0)
LYMPHS PCT: 13 % (ref 12–46)
Lymphs Abs: 0.7 10*3/uL (ref 0.7–4.0)
MCH: 28.9 pg (ref 26.0–34.0)
MCHC: 34 g/dL (ref 30.0–36.0)
MCV: 84.8 fL (ref 78.0–100.0)
MONO ABS: 0.8 10*3/uL (ref 0.1–1.0)
MONOS PCT: 14 % — AB (ref 3–12)
NEUTROS ABS: 3.7 10*3/uL (ref 1.7–7.7)
Neutrophils Relative %: 68 % (ref 43–77)
Platelets: 187 10*3/uL (ref 150–400)
RBC: 3.43 MIL/uL — ABNORMAL LOW (ref 4.22–5.81)
RDW: 14 % (ref 11.5–15.5)
WBC: 5.5 10*3/uL (ref 4.0–10.5)

## 2015-01-09 LAB — PROTIME-INR
INR: 1.17 (ref 0.00–1.49)
PROTHROMBIN TIME: 15.1 s (ref 11.6–15.2)

## 2015-01-09 LAB — BASIC METABOLIC PANEL
ANION GAP: 7 (ref 5–15)
BUN: 53 mg/dL — ABNORMAL HIGH (ref 6–20)
CO2: 22 mmol/L (ref 22–32)
Calcium: 8.6 mg/dL — ABNORMAL LOW (ref 8.9–10.3)
Chloride: 104 mmol/L (ref 101–111)
Creatinine, Ser: 2.18 mg/dL — ABNORMAL HIGH (ref 0.61–1.24)
GFR calc Af Amer: 37 mL/min — ABNORMAL LOW (ref >60)
GFR, EST NON AFRICAN AMERICAN: 32 mL/min — AB (ref >60)
GLUCOSE: 130 mg/dL — AB (ref 65–99)
POTASSIUM: 4.3 mmol/L (ref 3.5–5.1)
Sodium: 133 mmol/L — ABNORMAL LOW (ref 135–145)

## 2015-01-09 LAB — APTT: aPTT: 30 seconds (ref 24–37)

## 2015-01-09 LAB — TROPONIN I: Troponin I: <0.03 ng/mL (ref <0.031)

## 2015-01-09 LAB — PROCALCITONIN: Procalcitonin: 0.43 ng/mL

## 2015-01-09 LAB — LACTIC ACID, PLASMA
Lactic Acid, Venous: 0.7 mmol/L (ref 0.5–2.0)
Lactic Acid, Venous: 0.7 mmol/L (ref 0.5–2.0)

## 2015-01-09 LAB — MRSA PCR SCREENING: MRSA by PCR: NEGATIVE

## 2015-01-09 LAB — VANCOMYCIN, RANDOM: VANCOMYCIN RM: 11 ug/mL

## 2015-01-09 LAB — SODIUM, URINE, RANDOM: Sodium, Ur: 98 mmol/L

## 2015-01-09 MED ORDER — PNEUMOCOCCAL VAC POLYVALENT 25 MCG/0.5ML IJ INJ
0.5000 mL | INJECTION | INTRAMUSCULAR | Status: AC
  Administered 2015-01-10: 0.5 mL via INTRAMUSCULAR
  Filled 2015-01-09: qty 0.5

## 2015-01-09 MED ORDER — ACETAMINOPHEN 325 MG PO TABS: 650.0000 mg | ORAL_TABLET | Freq: Four times a day (QID) | ORAL | Status: DC | PRN

## 2015-01-09 MED ORDER — ONDANSETRON HCL 4 MG PO TABS: 4.0000 mg | ORAL_TABLET | Freq: Four times a day (QID) | ORAL | Status: DC | PRN

## 2015-01-09 MED ORDER — PANTOPRAZOLE SODIUM 40 MG PO TBEC
40.0000 mg | DELAYED_RELEASE_TABLET | Freq: Every day | ORAL | Status: DC
  Administered 2015-01-09 – 2015-01-12 (×4): 40 mg via ORAL
  Filled 2015-01-09 (×4): qty 1

## 2015-01-09 MED ORDER — ACETAMINOPHEN 650 MG RE SUPP: 650.0000 mg | Freq: Four times a day (QID) | RECTAL | Status: DC | PRN

## 2015-01-09 MED ORDER — CYCLOBENZAPRINE HCL 10 MG PO TABS
10.0000 mg | ORAL_TABLET | Freq: Three times a day (TID) | ORAL | Status: DC | PRN
  Administered 2015-01-09 – 2015-01-10 (×3): 10 mg via ORAL
  Filled 2015-01-09 (×3): qty 1

## 2015-01-09 MED ORDER — ONDANSETRON HCL 4 MG/2ML IJ SOLN: 4.0000 mg | Freq: Four times a day (QID) | INTRAMUSCULAR | Status: DC | PRN

## 2015-01-09 MED ORDER — RIFAMPIN 300 MG PO CAPS
300.0000 mg | ORAL_CAPSULE | Freq: Two times a day (BID) | ORAL | Status: DC
Start: 2015-01-09 — End: 2015-01-12
  Administered 2015-01-09 – 2015-01-12 (×8): 300 mg via ORAL
  Filled 2015-01-09 (×10): qty 1

## 2015-01-09 MED ORDER — ENOXAPARIN SODIUM 80 MG/0.8ML ~~LOC~~ SOLN
70.0000 mg | SUBCUTANEOUS | Status: DC
  Administered 2015-01-09 – 2015-01-11 (×3): 70 mg via SUBCUTANEOUS
  Filled 2015-01-09 (×3): qty 0.8

## 2015-01-09 MED ORDER — SODIUM CHLORIDE 0.9 % IV BOLUS (SEPSIS)
1000.0000 mL | Freq: Once | INTRAVENOUS | Status: AC
  Administered 2015-01-09: 1000 mL via INTRAVENOUS

## 2015-01-09 MED ORDER — ALBUTEROL SULFATE (2.5 MG/3ML) 0.083% IN NEBU: 3.0000 mL | INHALATION_SOLUTION | Freq: Four times a day (QID) | RESPIRATORY_TRACT | Status: DC | PRN

## 2015-01-09 MED ORDER — SACCHAROMYCES BOULARDII 250 MG PO CAPS
250.0000 mg | ORAL_CAPSULE | Freq: Two times a day (BID) | ORAL | Status: DC
  Administered 2015-01-09 – 2015-01-11 (×6): 250 mg via ORAL
  Filled 2015-01-09 (×7): qty 1

## 2015-01-09 MED ORDER — OXYCODONE-ACETAMINOPHEN 5-325 MG PO TABS
1.0000 | ORAL_TABLET | ORAL | Status: DC | PRN
  Administered 2015-01-09 – 2015-01-10 (×4): 1 via ORAL
  Filled 2015-01-09 (×4): qty 1

## 2015-01-09 MED ORDER — CLINDAMYCIN PHOSPHATE 600 MG/50ML IV SOLN
600.0000 mg | Freq: Three times a day (TID) | INTRAVENOUS | Status: DC
  Administered 2015-01-09 (×2): 600 mg via INTRAVENOUS
  Filled 2015-01-09 (×4): qty 50

## 2015-01-09 MED ORDER — FLUTICASONE PROPIONATE 50 MCG/ACT NA SUSP
1.0000 | Freq: Every day | NASAL | Status: DC
  Administered 2015-01-09 – 2015-01-12 (×4): 1 via NASAL
  Filled 2015-01-09 (×2): qty 16

## 2015-01-09 MED ORDER — CEFAZOLIN SODIUM 1-5 GM-% IV SOLN
1.0000 g | Freq: Three times a day (TID) | INTRAVENOUS | Status: DC
  Administered 2015-01-09 – 2015-01-12 (×9): 1 g via INTRAVENOUS
  Filled 2015-01-09 (×11): qty 50

## 2015-01-09 MED ORDER — SODIUM CHLORIDE 0.9 % IV SOLN
INTRAVENOUS | Status: AC
  Administered 2015-01-09 (×3): via INTRAVENOUS

## 2015-01-09 MED ORDER — PERFLUTREN LIPID MICROSPHERE
1.0000 mL | INTRAVENOUS | Status: AC | PRN
  Administered 2015-01-09: 2 mL via INTRAVENOUS
  Filled 2015-01-09: qty 10

## 2015-01-09 MED ORDER — ALBUTEROL SULFATE HFA 108 (90 BASE) MCG/ACT IN AERS
1.0000 | INHALATION_SPRAY | Freq: Four times a day (QID) | RESPIRATORY_TRACT | Status: DC | PRN
Start: 2015-01-09 — End: 2015-01-09

## 2015-01-09 NOTE — Consult Note (Signed)
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   Consider dapto (although he states he is already having muscle aches)  ARF  Renal u/s pending  Stopping nephrotoxins   Constipation  Improve laxative mgmt  Health Maintainence  Check HIV   Thank you so much for this interesting consult,   Bobby Rumpf (pager) [phone] [email or web address]  Eddie Fletcher is an 59 y.o. male.  HPI: 59 yo M with hx of R TKR 01-30-14 who did well until developing swelling and pain ~ 12-30-14. He then developed a draining os. He was placed on po doxy.   He was adm to hospital on 8-26 and underwent I & D, poly-exchange, anbx beads. His operative Cx grew MSSA, he was d/c to SNF on 8-29 with IV vancomycin, rifampin.  He returns to hospital on 8-31 with an elevated Cr (0.98 to 1.67 --> 2.6). There was concern he was septic on adm (WBC normal). Has also been on NSAIDs, ARB, HCTZ.  Random vancomycin level was 29 on adm.  He was afebrile on adm but reported a temp of 101 on 8-29.    Past Medical History  Diagnosis Date  . Edema     Leg  . Obesity   . Chest discomfort     15 years ago; had negative testing and told it was related to stress  . Diaphoresis   . Cellulitis   . GERD (gastroesophageal reflux disease)   . Cardiomegaly   . Hypertension   . Sleep apnea     cpap 3 yrs-not used for 3-4 months  . SOB (shortness of breath)     occ with exersion  . History of kidney stones   . Arthritis   . Wears glasses   . Seasonal allergies   . Complication of anesthesia     blood pressure - low post op  . Colon polyps   . Hemorrhoids     Past Surgical History  Procedure Laterality Date  . Carpal tunnel release Bilateral 04  . Trigger  finger release Right 12    rt thumb  . Knee arthroscopy Right 12  . Total knee arthroplasty Right 01/30/2014    Procedure: Right Total Knee Arthroplasty;  Surgeon: Newt Minion, MD;  Location: [facility];  Service: Orthopedics;  Laterality: Right;  . Colonoscopy w/ biopsies and polypectomy    . Total knee revision Right 01/03/2015    Procedure: Revision Right Total Knee Arthroplasty, Poly Exchange, Place Antibiotic Beads;  Surgeon: Newt Minion, MD;  Location: [facility];  Service: Orthopedics;  Laterality: Right;  . Application of wound vac Right 01/03/2015    Procedure: APPLICATION OF INCISIONAL  WOUND VAC;  Surgeon: Newt Minion, MD;  Location: [facility];  Service: Orthopedics;  Laterality: Right;     Allergies  Allergen Reactions  . Keflex [Cephalexin] Rash  . Lisinopril Cough  . Sulfa Antibiotics Rash    Medications:  Scheduled: . clindamycin (CLEOCIN) IV  600 mg Intravenous 3 times per day  . enoxaparin (LOVENOX) injection  70 mg Subcutaneous Q24H  . fluticasone  1 spray Each Nare Daily  . pantoprazole  40 mg Oral Daily  . [START ON 01/10/2015] pneumococcal 23 valent vaccine  0.5 mL Intramuscular Tomorrow-1000  .  rifampin  300 mg Oral BID  . saccharomyces boulardii  250 mg Oral BID    Abtx:  Anti-infectives    Start     Dose/Rate Route Frequency Ordered Stop   01/09/15 0109  rifampin (RIFADIN) capsule 300 mg     300 mg Oral 2 times daily 01/09/15 0109     01/09/15 0109  clindamycin (CLEOCIN) IVPB 600 mg     600 mg 100 mL/hr over 30 Minutes Intravenous 3 times per day 01/09/15 0109        Total days of antibiotics: 7          Social History:  reports that he has never smoked. He has never used smokeless tobacco. He reports that he does not drink alcohol or use illicit drugs.  Family History  Problem Relation Age of Onset  . Peripheral vascular disease Father   . Hypertension Maternal Grandfather   . Leukemia Maternal Aunt   . Breast cancer Maternal Aunt   . Alcoholism Mother      General ROS: no BM for 1 week, no recent fevers, no problems with PIC, normal urination, see HPI. 12 point ROS o/w normal.   Blood pressure 91/53, pulse 88, temperature 98.5 F (36.9 C), temperature source Oral, resp. rate 15, height $RemoveBe'5\' 8"'sPjSlXRtc$  (1.727 m), weight 146.9 kg (323 lb 13.7 oz), SpO2 93 %. General appearance: alert, cooperative and no distress Eyes: negative findings: conjunctivae and sclerae normal and pupils equal, round, reactive to light and accomodation Throat: lips, mucosa, and tongue normal; teeth and gums normal Neck: no adenopathy and supple, symmetrical, trachea midline Lungs: clear to auscultation bilaterally Heart: regular rate and rhythm Abdomen: normal findings: bowel sounds normal and soft, non-tender and abnormal findings:  distended Extremities: LUE- PIC is clean, non-tender.   R knee- sutures intact. Mild pustulent d/c from superior portion of wound.  Neuro- grossly normal light touch.     Results for orders placed or performed during the hospital encounter of 01/08/15 (from the past 48 hour(s))  Comprehensive metabolic panel     Status: Abnormal   Collection Time: 01/08/15  6:41 PM  Result Value Ref Range   Sodium 136 135 - 145 mmol/L   Potassium 4.2 3.5 - 5.1 mmol/L   Chloride 101 101 - 111 mmol/L   CO2 24 22 - 32 mmol/L   Glucose, Bld 111 (H) 65 - 99 mg/dL   BUN 61 (H) 6 - 20 mg/dL   Creatinine, Ser 2.60 (H) 0.61 - 1.24 mg/dL   Calcium 10.3 8.9 - 10.3 mg/dL   Total Protein 8.4 (H) 6.5 - 8.1 g/dL   Albumin 2.7 (L) 3.5 - 5.0 g/dL   AST 44 (H) 15 - 41 U/L   ALT 30 17 - 63 U/L   Alkaline Phosphatase 103 38 - 126 U/L   Total Bilirubin 1.4 (H) 0.3 - 1.2 mg/dL   GFR calc non Af Amer 26 (L) >60 mL/min   GFR calc Af Amer 30 (L) >60 mL/min    Comment: (NOTE) The eGFR has been calculated using the CKD EPI equation. This calculation has not been validated in all clinical situations. eGFR's persistently <60 mL/min signify possible Chronic Kidney Disease.      Anion gap 11 5 - 15  CBC     Status: Abnormal   Collection Time: 01/08/15  6:41 PM  Result Value Ref Range   WBC 6.8 4.0 - 10.5 K/uL   RBC 3.99 (L) 4.22 - 5.81 MIL/uL   Hemoglobin  11.4 (L) 13.0 - 17.0 g/dL   HCT 33.4 (L) 39.0 - 52.0 %   MCV 83.7 78.0 - 100.0 fL   MCH 28.6 26.0 - 34.0 pg   MCHC 34.1 30.0 - 36.0 g/dL   RDW 13.7 11.5 - 15.5 %   Platelets 219 150 - 400 K/uL  Urinalysis, Routine w reflex microscopic (not at [facility])     Status: Abnormal   Collection Time: 01/08/15  8:44 PM  Result Value Ref Range   Color, Urine AMBER (A) YELLOW    Comment: BIOCHEMICALS MAY BE AFFECTED BY COLOR   APPearance CLOUDY (A) CLEAR   Specific Gravity, Urine 1.017 1.005 - 1.030   pH 5.0 5.0 - 8.0   Glucose, UA NEGATIVE NEGATIVE mg/dL   Hgb urine dipstick NEGATIVE NEGATIVE   Bilirubin Urine NEGATIVE NEGATIVE   Ketones, ur NEGATIVE NEGATIVE mg/dL   Protein, ur NEGATIVE NEGATIVE mg/dL   Urobilinogen, UA 0.2 0.0 - 1.0 mg/dL   Nitrite NEGATIVE NEGATIVE   Leukocytes, UA TRACE (A) NEGATIVE  Urine culture     Status: None (Preliminary result)   Collection Time: 01/08/15  8:44 PM  Result Value Ref Range   Specimen Description URINE, CLEAN CATCH    Special Requests NONE    Culture NO GROWTH < 12 HOURS    Report Status PENDING   Creatinine, urine, random     Status: None   Collection Time: 01/08/15  8:44 PM  Result Value Ref Range   Creatinine, Urine 151.77 mg/dL  Urine microscopic-add on     Status: Abnormal   Collection Time: 01/08/15  8:44 PM  Result Value Ref Range   Squamous Epithelial / LPF FEW (A) RARE   WBC, UA 0-2 <3 WBC/hpf   RBC / HPF 0-2 <3 RBC/hpf   Bacteria, UA RARE RARE  Vancomycin, random     Status: None   Collection Time: 01/08/15  9:40 PM  Result Value Ref Range   Vancomycin Rm 11 ug/mL    Comment:        Random Vancomycin therapeutic range is dependent on dosage and time of specimen collection. A peak range is 20.0-40.0 ug/mL A trough range is 5.0-15.0 ug/mL           I-Stat CG4 Lactic Acid, ED     Status: None   Collection Time: 01/08/15  9:47 PM  Result Value Ref Range   Lactic Acid, Venous 1.07 0.5 - 2.0 mmol/L  Differential     Status: Abnormal   Collection Time: 01/08/15  9:48 PM  Result Value Ref Range   Neutrophils Relative % 68 43 - 77 %   Neutro Abs 5.0 1.7 - 7.7 K/uL   Lymphocytes Relative 10 (L) 12 - 46 %   Lymphs Abs 0.7 0.7 - 4.0 K/uL   Monocytes Relative 17 (H) 3 - 12 %   Monocytes Absolute 1.3 (H) 0.1 - 1.0 K/uL   Eosinophils Relative 5 0 - 5 %   Eosinophils Absolute 0.4 0.0 - 0.7 K/uL   Basophils Relative 0 0 - 1 %   Basophils Absolute 0.0 0.0 - 0.1 K/uL  Sedimentation rate     Status: Abnormal   Collection Time: 01/08/15  9:48 PM  Result Value Ref Range   Sed Rate 93 (H) 0 - 16 mm/hr  C-reactive protein     Status: Abnormal   Collection Time: 01/08/15  9:48 PM  Result Value Ref Range   CRP 17.0 (H) <1.0 mg/dL  CBG monitoring, ED  Status: None   Collection Time: 01/08/15 10:32 PM  Result Value Ref Range   Glucose-Capillary 97 65 - 99 mg/dL  MRSA PCR Screening     Status: None   Collection Time: 01/09/15  2:18 AM  Result Value Ref Range   MRSA by PCR NEGATIVE NEGATIVE    Comment:        The GeneXpert MRSA Assay (FDA approved for NASAL specimens only), is one component of a comprehensive MRSA colonization surveillance program. It is not intended to diagnose MRSA infection nor to guide or monitor treatment for MRSA infections.   Sodium, urine, random     Status: None   Collection Time: 01/09/15  2:19 AM  Result Value Ref Range   Sodium, Ur 98 mmol/L  Basic metabolic panel     Status: Abnormal   Collection Time: 01/09/15  5:10 AM  Result Value Ref Range   Sodium 133 (L) 135 - 145 mmol/L   Potassium 4.3 3.5 - 5.1 mmol/L   Chloride 104 101 - 111 mmol/L   CO2 22 22 - 32 mmol/L   Glucose, Bld 130 (H) 65 - 99 mg/dL   BUN 53 (H) 6 - 20 mg/dL   Creatinine, Ser 7.50 (H) 0.61 - 1.24 mg/dL   Calcium 8.6 (L) 8.9 -  10.3 mg/dL   GFR calc non Af Amer 32 (L) >60 mL/min   GFR calc Af Amer 37 (L) >60 mL/min    Comment: (NOTE) The eGFR has been calculated using the CKD EPI equation. This calculation has not been validated in all clinical situations. eGFR's persistently <60 mL/min signify possible Chronic Kidney Disease.    Anion gap 7 5 - 15  Hepatic function panel     Status: Abnormal   Collection Time: 01/09/15  5:10 AM  Result Value Ref Range   Total Protein 6.3 (L) 6.5 - 8.1 g/dL   Albumin 2.2 (L) 3.5 - 5.0 g/dL   AST 34 15 - 41 U/L   ALT 23 17 - 63 U/L   Alkaline Phosphatase 89 38 - 126 U/L   Total Bilirubin 1.1 0.3 - 1.2 mg/dL   Bilirubin, Direct 0.4 0.1 - 0.5 mg/dL   Indirect Bilirubin 0.7 0.3 - 0.9 mg/dL  CBC WITH DIFFERENTIAL     Status: Abnormal   Collection Time: 01/09/15  5:10 AM  Result Value Ref Range   WBC 5.5 4.0 - 10.5 K/uL   RBC 3.43 (L) 4.22 - 5.81 MIL/uL   Hemoglobin 9.9 (L) 13.0 - 17.0 g/dL   HCT 42.3 (L) 78.2 - 76.9 %   MCV 84.8 78.0 - 100.0 fL   MCH 28.9 26.0 - 34.0 pg   MCHC 34.0 30.0 - 36.0 g/dL   RDW 70.4 44.4 - 83.1 %   Platelets 187 150 - 400 K/uL   Neutrophils Relative % 68 43 - 77 %   Neutro Abs 3.7 1.7 - 7.7 K/uL   Lymphocytes Relative 13 12 - 46 %   Lymphs Abs 0.7 0.7 - 4.0 K/uL   Monocytes Relative 14 (H) 3 - 12 %   Monocytes Absolute 0.8 0.1 - 1.0 K/uL   Eosinophils Relative 5 0 - 5 %   Eosinophils Absolute 0.3 0.0 - 0.7 K/uL   Basophils Relative 0 0 - 1 %   Basophils Absolute 0.0 0.0 - 0.1 K/uL  Procalcitonin     Status: None   Collection Time: 01/09/15  5:10 AM  Result Value Ref Range   Procalcitonin 0.43 ng/mL  Comment: CORRECTED RESULTS CALLED TO: Domenica Fail RN 01/09/15 0736 COSTELLO B (NOTE)         ICU PCT Algorithm               Non ICU PCT Algorithm    ----------------------------     ------------------------------         PCT < 0.25 ng/mL                 PCT < 0.1 ng/mL     Stopping of antibiotics            Stopping of antibiotics        strongly encouraged.               strongly encouraged.    ----------------------------     ------------------------------       PCT level decrease by               PCT < 0.25 ng/mL       >= 80% from peak PCT       OR PCT 0.25 - 0.5 ng/mL          Stopping of antibiotics                                             encouraged.     Stopping of antibiotics           encouraged.    ----------------------------     ------------------------------       PCT level decrease by              PCT >= 0.25 ng/mL       < 80% from peak PCT        AND PCT >= 0.5 ng/mL            Continuing antibiotics                                              encouraged.        Continuing antibiotics            encouraged.    ----------------------------     ------------------------------     PCT level increase compared          PCT > 0.5 ng/mL         with peak PCT AND          PCT >= 0.5 ng/mL             Escalation of antibiotics                                          strongly encouraged.      Escalation of antibiotics        strongly encouraged. CORRECTED ON 09/01 AT 0813: PREVIOUSLY REPORTED AS 28.89        Interpretation: PCT >= 10 ng/mL: Important systemic inflammatory response, almost exclusively due to severe bacterial sepsis or septic shock.   Lactic acid, plasma     Status: None   Collection Time: 01/09/15  6:19 AM  Result Value Ref Range   Lactic Acid, Venous 0.7 0.5 -  2.0 mmol/L  Lactic acid, plasma     Status: None   Collection Time: 01/09/15  9:00 AM  Result Value Ref Range   Lactic Acid, Venous 0.7 0.5 - 2.0 mmol/L  Protime-INR     Status: None   Collection Time: 01/09/15  9:00 AM  Result Value Ref Range   Prothrombin Time 15.1 11.6 - 15.2 seconds   INR 1.17 0.00 - 1.49  APTT     Status: None   Collection Time: 01/09/15  9:00 AM  Result Value Ref Range   aPTT 30 24 - 37 seconds  Troponin I (q 6hr x 3)     Status: None   Collection Time: 01/09/15  9:00 AM  Result Value Ref Range    Troponin I <0.03 <0.031 ng/mL    Comment:        NO INDICATION OF MYOCARDIAL INJURY.       Component Value Date/Time   SDES URINE, CLEAN CATCH 01/08/2015 2044   SPECREQUEST NONE 01/08/2015 2044   CULT NO GROWTH < 12 HOURS 01/08/2015 2044   REPTSTATUS PENDING 01/08/2015 2044   Dg Chest Port 1 View  01/09/2015   CLINICAL DATA:  Sepsis.  EXAM: PORTABLE CHEST - 1 VIEW  COMPARISON:  01/23/2014  FINDINGS: The heart is mildly enlarged. The left PICC line tip is in the distal SVC near the cavoatrial junction. There is streaky bibasilar atelectasis or infiltrates and possible left effusion. Mild central vascular congestion without overt pulmonary edema. The bony thorax is intact.  IMPRESSION: Streaky bibasilar airspace opacity, atelectasis versus infiltrates.  Left PICC line in the distal SVC.   Electronically Signed   By: Marijo Sanes M.D.   On: 01/09/2015 07:39   Recent Results (from the past 240 hour(s))  Anaerobic culture     Status: None   Collection Time: 01/03/15  3:54 PM  Result Value Ref Range Status   Specimen Description TISSUE RIGHT KNEE  Final   Special Requests SPEC A  Final   Gram Stain   Final    FEW WBC PRESENT,BOTH PMN AND MONONUCLEAR NO ORGANISMS SEEN Performed at [facility]    Culture   Final    NO ANAEROBES ISOLATED Performed at [facility]    Report Status 01/08/2015 FINAL  Final  Tissue culture     Status: None   Collection Time: 01/03/15  3:58 PM  Result Value Ref Range Status   Specimen Description TISSUE RIGHT KNEE  Final   Special Requests SPEC A  Final   Gram Stain   Final    NO WBC SEEN NO ORGANISMS SEEN Performed at [facility]    Culture   Final    FEW STAPHYLOCOCCUS AUREUS Performed at [facility]    Report Status 01/06/2015 FINAL  Final   Organism ID, Bacteria STAPHYLOCOCCUS AUREUS  Final      Susceptibility   Staphylococcus aureus - MIC*    CLINDAMYCIN <=0.25 SENSITIVE Sensitive     ERYTHROMYCIN 0.5  SENSITIVE Sensitive     GENTAMICIN <=0.5 SENSITIVE Sensitive     LEVOFLOXACIN <=0.12 SENSITIVE Sensitive     OXACILLIN <=0.25 SENSITIVE Sensitive     RIFAMPIN <=0.5 SENSITIVE Sensitive     TRIMETH/SULFA <=10 SENSITIVE Sensitive     VANCOMYCIN 1 SENSITIVE Sensitive     TETRACYCLINE <=1 SENSITIVE Sensitive     MOXIFLOXACIN <=0.25 SENSITIVE Sensitive     * FEW STAPHYLOCOCCUS AUREUS  Tissue culture     Status: None  Collection Time: 01/03/15  4:05 PM  Result Value Ref Range Status   Specimen Description TISSUE RIGHT KNEE  Final   Special Requests SPEC B  Final   Gram Stain   Final    NO WBC SEEN NO ORGANISMS SEEN Performed at [facility]    Culture   Final    NO GROWTH 3 DAYS Performed at [facility]    Report Status 01/07/2015 FINAL  Final  Urine culture     Status: None (Preliminary result)   Collection Time: 01/08/15  8:44 PM  Result Value Ref Range Status   Specimen Description URINE, CLEAN CATCH  Final   Special Requests NONE  Final   Culture NO GROWTH < 12 HOURS  Final   Report Status PENDING  Incomplete  MRSA PCR Screening     Status: None   Collection Time: 01/09/15  2:18 AM  Result Value Ref Range Status   MRSA by PCR NEGATIVE NEGATIVE Final    Comment:        The GeneXpert MRSA Assay (FDA approved for NASAL specimens only), is one component of a comprehensive MRSA colonization surveillance program. It is not intended to diagnose MRSA infection nor to guide or monitor treatment for MRSA infections.       01/09/2015, 10:35 AM     LOS: 1 day    Records and images were personally reviewed where available.

## 2015-01-09 NOTE — Progress Notes (Signed)
VASCULAR LAB PRELIMINARY  PRELIMINARY  PRELIMINARY  PRELIMINARY  Bilateral lower extremity venous duplex completed.    Preliminary report:  There is no obvious evidence of DVT or SVT noted in the visualized veins of the bilateral lower extremities.   Caprisha Bridgett, RVT 01/09/2015, 8:50 AM

## 2015-01-09 NOTE — Progress Notes (Signed)
PROGRESS NOTE  SPARTACUS SETZER [id number] DOB: Jun 23, 1955 DOA: 01/08/2015 PCP: Curly Rim, MD  Brief History 59 year old male with a history of hypertension, GERD presented to the emergency department secondary to being notified of abnormal labs, notably increased serum creatinine. The patient was recently discharged from the hospital on 01/06/2015 after revision and exchange of his polyethylene liner on his right knee prosthesis. The patient initially had a right total knee arthroplasty on 01/30/2014. He did well for a period of time. Approximately one week prior to his revision, around 12/30/2014, the patient noted increasing swelling, pain, and subsequent drainage from his right knee. He was placed on oral doxycycline in the outpatient setting and subsequently admitted to the hospital. Intraoperative cultures from his knee revision grew MSSA. The patient was discharged home on vancomycin as a result of his allergy to cephalexin. In addition, the patient took 2 days of rifampin. Assessment/Plan: Sepsis -Secondary to infected right knee arthroplasty -Blood cultures 2 sets -Continue intravenous fluids -Continue intravenous antibiotics -Procalcitonin 28.8 -Lactic acid 1.07 -Urinalysis negative for polyuria Infected right knee arthroplasty-MSSA -consult ID -concerned he may ultimately need explantation -continue IV abx -continue rifampin for now -Continue clindamycin for now although this is bacteriostatic -defer decision to restart IV vanco to ID -Dr. Sharol Given following Acute kidney injury -This may be multifactorial including the patient's recent use of ibuprofen, intravenous vancomycin, as well as his sepsis in the setting of ARB and HCTZ use -01/06/2015 random vancomycin level 29; however, random Vanco level on 01/08/2015--11 -defer decision to restart vanco to ID Right leg pain and edema -Obtain duplex r/o DVT Hypertension -Hold ARB in the setting of renal failure  and soft blood pressures -d/c HCTZ Hyperglycemia -Check hemoglobin A1c    Family Communication:   Pt at beside Disposition Plan:   Home when medically stable       Procedures/Studies: Dg Chest Port 1 View  01/09/2015   CLINICAL DATA:  Sepsis.  EXAM: PORTABLE CHEST - 1 VIEW  COMPARISON:  01/23/2014  FINDINGS: The heart is mildly enlarged. The left PICC line tip is in the distal SVC near the cavoatrial junction. There is streaky bibasilar atelectasis or infiltrates and possible left effusion. Mild central vascular congestion without overt pulmonary edema. The bony thorax is intact.  IMPRESSION: Streaky bibasilar airspace opacity, atelectasis versus infiltrates.  Left PICC line in the distal SVC.   Electronically Signed   By: Marijo Sanes M.D.   On: 01/09/2015 07:39         Subjective:  Patient complains of drainage in the right knee. Denies any chest discomfort, shortness breath, headaches, nausea, vomiting, diarrhea, vomiting, dysuria, hematuria. No hematochezia or melena.  Objective: Filed Vitals:   01/09/15 0502 01/09/15 0555 01/09/15 0645 01/09/15 0700  BP: 89/45 105/66 91/53   Pulse: 89 88 88   Temp:    98.5 F (36.9 C)  TempSrc:    Oral  Resp: 15 16 15    Height:      Weight:      SpO2: 93% 94% 93%     Intake/Output Summary (Last 24 hours) at 01/09/15 0827 Last data filed at 01/09/15 0700  Gross per 24 hour  Intake   4085 ml  Output   1450 ml  Net   2635 ml   Weight change:  Exam:   General:  Pt is alert, follows commands appropriately, not in acute distress  HEENT: No icterus, No thrush, No neck mass,  [place]/AT  Cardiovascular: RRR, S1/S2, no rubs, no gallops  Respiratory: CTA bilaterally, no wheezing, no crackles, no rhonchi  Abdomen: Soft/+BS, non tender, non distended, no guarding  Extremities: 2+ RLE edema, No lymphangitis, No petechiae, No rashes, no synovitis; white thick drainage along R-knee sutures  Data Reviewed: Basic Metabolic  Panel:  Recent Labs Lab 01/03/15 1140 01/08/15 1841 01/09/15 0510  NA 136 136 133*  K 3.7 4.2 4.3  CL 99* 101 104  CO2 29 24 22   GLUCOSE 122* 111* 130*  BUN 18 61* 53*  CREATININE 0.98 2.60* 2.18*  CALCIUM 9.0 10.3 8.6*   Liver Function Tests:  Recent Labs Lab 01/03/15 1140 01/08/15 1841 01/09/15 0510  AST 35 44* 34  ALT 32 30 23  ALKPHOS 106 103 89  BILITOT 0.8 1.4* 1.1  PROT 8.3* 8.4* 6.3*  ALBUMIN 3.2* 2.7* 2.2*   No results for input(s): LIPASE, AMYLASE in the last 168 hours. No results for input(s): AMMONIA in the last 168 hours. CBC:  Recent Labs Lab 01/03/15 1140 01/08/15 1841 01/08/15 2148 01/09/15 0510  WBC 5.2 6.8  --  5.5  NEUTROABS  --   --  5.0 3.7  HGB 13.9 11.4*  --  9.9*  HCT 40.6 33.4*  --  29.1*  MCV 84.8 83.7  --  84.8  PLT 234 219  --  187   Cardiac Enzymes: No results for input(s): CKTOTAL, CKMB, CKMBINDEX, TROPONINI in the last 168 hours. BNP: Invalid input(s): POCBNP CBG:  Recent Labs Lab 01/08/15 2232  GLUCAP 97    Recent Results (from the past 240 hour(s))  Anaerobic culture     Status: None   Collection Time: 01/03/15  3:54 PM  Result Value Ref Range Status   Specimen Description TISSUE RIGHT KNEE  Final   Special Requests SPEC A  Final   Gram Stain   Final    FEW WBC PRESENT,BOTH PMN AND MONONUCLEAR NO ORGANISMS SEEN Performed at [facility]    Culture   Final    NO ANAEROBES ISOLATED Performed at [facility]    Report Status 01/08/2015 FINAL  Final  Tissue culture     Status: None   Collection Time: 01/03/15  3:58 PM  Result Value Ref Range Status   Specimen Description TISSUE RIGHT KNEE  Final   Special Requests SPEC A  Final   Gram Stain   Final    NO WBC SEEN NO ORGANISMS SEEN Performed at [facility]    Culture   Final    FEW STAPHYLOCOCCUS AUREUS Performed at [facility]    Report Status 01/06/2015 FINAL  Final   Organism ID, Bacteria STAPHYLOCOCCUS AUREUS   Final      Susceptibility   Staphylococcus aureus - MIC*    CLINDAMYCIN <=0.25 SENSITIVE Sensitive     ERYTHROMYCIN 0.5 SENSITIVE Sensitive     GENTAMICIN <=0.5 SENSITIVE Sensitive     LEVOFLOXACIN <=0.12 SENSITIVE Sensitive     OXACILLIN <=0.25 SENSITIVE Sensitive     RIFAMPIN <=0.5 SENSITIVE Sensitive     TRIMETH/SULFA <=10 SENSITIVE Sensitive     VANCOMYCIN 1 SENSITIVE Sensitive     TETRACYCLINE <=1 SENSITIVE Sensitive     MOXIFLOXACIN <=0.25 SENSITIVE Sensitive     * FEW STAPHYLOCOCCUS AUREUS  Tissue culture     Status: None   Collection Time: 01/03/15  4:05 PM  Result Value Ref Range Status   Specimen Description TISSUE RIGHT KNEE  Final   Special Requests  SPEC B  Final   Gram Stain   Final    NO WBC SEEN NO ORGANISMS SEEN Performed at [facility]    Culture   Final    NO GROWTH 3 DAYS Performed at [facility]    Report Status 01/07/2015 FINAL  Final  MRSA PCR Screening     Status: None   Collection Time: 01/09/15  2:18 AM  Result Value Ref Range Status   MRSA by PCR NEGATIVE NEGATIVE Final    Comment:        The GeneXpert MRSA Assay (FDA approved for NASAL specimens only), is one component of a comprehensive MRSA colonization surveillance program. It is not intended to diagnose MRSA infection nor to guide or monitor treatment for MRSA infections.      Scheduled Meds: . clindamycin (CLEOCIN) IV  600 mg Intravenous 3 times per day  . enoxaparin (LOVENOX) injection  70 mg Subcutaneous Q24H  . fluticasone  1 spray Each Nare Daily  . pantoprazole  40 mg Oral Daily  . rifampin  300 mg Oral BID  . saccharomyces boulardii  250 mg Oral BID   Continuous Infusions: . sodium chloride 150 mL/hr at 01/09/15 0600     Nahima Ales, DO  [facility] Pager [phone]  If 7PM-7AM, please contact night-coverage [email or web address] Password [email or web address] 01/09/2015, 8:27 AM   LOS: 1 day

## 2015-01-09 NOTE — Consult Note (Signed)
WOC wound consult note Reason for Consult:  Placement of multilayer compression wrap RLE Wound type: surgical wound managed by orthopedic MD Verified with Dr. Sharol Given placement of the Profore wrap over distal portion of the suture line.  Dressing procedure/placement/frequency: Profore applied without difficultly.  4x4 gauze placed over the distal aspect of the suture line under the Profore wrap.  Changed gauze over the proximal portion of the suture line and wrapped this area with kerlix.   WOC will follow along with you for Profore compression wrap changes weekly.  [place], [place]

## 2015-01-09 NOTE — Progress Notes (Signed)
Echocardiogram 2D Echocardiogram has been performed.  Eddie Fletcher 01/09/2015, 9:05 AM

## 2015-01-09 NOTE — Progress Notes (Signed)
Patient ID: Eddie Fletcher, male   DOB: 22-Jul-1955, 59 y.o.   MRN: [id number] Patient comfortable without right knee pain. Patient has clear serosanguineous drainage which is to be expected from the antibiotics beads placed within the joint. Patient has venous stasis insufficiency swelling in the right leg and I will order a Profore wrap for the right leg. Infectious disease consulted for antibiotics coverage for the right knee.

## 2015-01-09 NOTE — Consult Note (Signed)
Name: Eddie Fletcher MRN: [id number] DOB: Jan 13, 1956    ADMISSION DATE:  01/08/2015 CONSULTATION DATE:  01/09/2015  REFERRING MD :  Hospitalist Service  CHIEF COMPLAINT:  Evaluation of Sepsis  BRIEF PATIENT DESCRIPTION: 59 year old male status post revision of right knee replacement with resultant infection. Recently discharged home on vancomycin with left upper extremity PICC line in place.  SIGNIFICANT EVENTS  8/26 - Right knee replacement revision 8/31 - Admitted to Hospital  STUDIES:  Portable CXR 9/1 - bilateral lower lobe atelectasis.  HISTORY OF PRESENT ILLNESS:  59 year old male status post right knee replacement revision and subsequent infection by MSSA. Patient was discharged to home on IV vancomycin. He reports that when he went home he felt "fine". He reports baseline dyspnea. He does report a cough that is nonproductive and seems to be slightly worse this morning after his fluid resuscitation overnight. He denies any chest pain, pressure, tightness, or palpitations. He denies any syncope or near syncope. He is constipated. He denies any nausea, vomiting, or diarrhea. He reports on Monday he experienced chills and sweats as well as a fever of 101F when he checked his temperature. He has had no infectious symptoms since then. He has noticed some increased bilateral lower extremity edema but denies any frank pain in his calves. He does report some "cramps" in his right buttock & leg. On admission the patient was continued on clindamycin & rifampin. Vancomycin is currently on hold given the patient's acute renal failure. Infectious diseases has been consulted.  PAST MEDICAL HISTORY :  Past Medical History  Diagnosis Date  . Edema     Leg  . Obesity   . Chest discomfort     15 years ago; had negative testing and told it was related to stress  . Diaphoresis   . Cellulitis   . GERD (gastroesophageal reflux disease)   . Cardiomegaly   . Hypertension   . Sleep apnea    cpap 3 yrs-not used for 3-4 months  . SOB (shortness of breath)     occ with exersion  . History of kidney stones   . Arthritis   . Wears glasses   . Seasonal allergies   . Complication of anesthesia     blood pressure - low post op  . Colon polyps   . Hemorrhoids    SURGICAL HISTORY: Past Surgical History  Procedure Laterality Date  . Carpal tunnel release Bilateral 04  . Trigger finger release Right 12    rt thumb  . Knee arthroscopy Right 12  . Total knee arthroplasty Right 01/30/2014    Procedure: Right Total Knee Arthroplasty;  Surgeon: Newt Minion, MD;  Location: [place];  Service: Orthopedics;  Laterality: Right;  . Colonoscopy w/ biopsies and polypectomy    . Total knee revision Right 01/03/2015    Procedure: Revision Right Total Knee Arthroplasty, Poly Exchange, Place Antibiotic Beads;  Surgeon: Newt Minion, MD;  Location: [place];  Service: Orthopedics;  Laterality: Right;  . Application of wound vac Right 01/03/2015    Procedure: APPLICATION OF INCISIONAL  WOUND VAC;  Surgeon: Newt Minion, MD;  Location: [place];  Service: Orthopedics;  Laterality: Right;   Prior to Admission medications   Medication Sig Start Date End Date Taking? Authorizing Provider  albuterol (PROVENTIL HFA;VENTOLIN HFA) 108 (90 BASE) MCG/ACT inhaler Inhale 1-2 puffs into the lungs every 6 (six) hours as needed for wheezing or shortness of breath.   Yes Historical Provider, MD  cholecalciferol (  VITAMIN D) 1000 UNITS tablet Take 1,000 Units by mouth daily.   Yes Historical Provider, MD  cyclobenzaprine (FLEXERIL) 10 MG tablet Take 10 mg by mouth 3 (three) times daily as needed for muscle spasms.   Yes Historical Provider, MD  fluticasone (FLONASE) 50 MCG/ACT nasal spray Place 1 spray into both nostrils daily.   Yes Historical Provider, MD  hydrochlorothiazide (MICROZIDE) 12.5 MG capsule Take 12.5 mg by mouth daily.   Yes Historical Provider, MD  ibuprofen (ADVIL,MOTRIN) 800 MG tablet Take 800 mg by mouth  2 (two) times daily as needed for mild pain.   Yes Historical Provider, MD  omeprazole (PRILOSEC) 20 MG capsule Take 20 mg by mouth daily.   Yes Historical Provider, MD  oxyCODONE-acetaminophen (ROXICET) 5-325 MG per tablet Take 1 tablet by mouth every 4 (four) hours as needed for severe pain. 01/06/15  Yes Newt Minion, MD  rifampin (RIFADIN) 300 MG capsule Take 1 capsule (300 mg total) by mouth 2 (two) times daily. 01/06/15  Yes Newt Minion, MD  tamsulosin (FLOMAX) 0.4 MG CAPS capsule Take 0.4 mg by mouth at bedtime.   Yes Historical Provider, MD  valsartan (DIOVAN) 160 MG tablet Take 160 mg by mouth daily.   Yes Historical Provider, MD  aspirin EC 325 MG tablet Take 1 tablet (325 mg total) by mouth daily. Patient not taking: Reported on 01/02/2015 02/01/14   Newt Minion, MD  methocarbamol (ROBAXIN) 500 MG tablet Take 1 tablet (500 mg total) by mouth 3 (three) times daily. Patient not taking: Reported on 01/02/2015 02/01/14   Newt Minion, MD  oxyCODONE-acetaminophen (ROXICET) 5-325 MG per tablet Take one tablet by mouth every 4 hours as needed for severe pain Patient not taking: Reported on 01/02/2015 02/04/14   Tiffany L Reed, DO  vancomycin 1,000 mg in sodium chloride 0.9 % 100 mL Inject 1,000 mg into the vein daily. 01/06/15   Newt Minion, MD   Allergies  Allergen Reactions  . Keflex [Cephalexin] Rash  . Lisinopril Cough  . Sulfa Antibiotics Rash   FAMILY HISTORY:  Family History  Problem Relation Age of Onset  . Peripheral vascular disease Father   . Hypertension Maternal Grandfather   . Leukemia Maternal Aunt   . Breast cancer Maternal Aunt   . Alcoholism Mother    SOCIAL HISTORY: Social History   Social History  . Marital Status: Married    Spouse Name: N/A  . Number of Children: N/A  . Years of Education: N/A   Social History Main Topics  . Smoking status: Never Smoker   . Smokeless tobacco: Never Used  . Alcohol Use: No     Comment: quit 15 yrs  . Drug Use: No  .  Sexual Activity: Not Asked   Other Topics Concern  . None   Social History Narrative   Patient's originally from [place] but was born [place]. 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 Has 3 cats. No bird or mold exposure. No recent travel. He does report there is water damage under his house.   REVIEW OF SYSTEMS:  Denies any rashes or abnormal bruising. Denies any dysuria or hematuria. A pertinent 14 point review of systems is negative except as per the history of presenting illness.  SUBJECTIVE:   VITAL SIGNS: Temp:  [97.7 F (36.5 C)-98.9 F (37.2 C)] 98.5 F (36.9 C) (09/01 0700) Pulse Rate:  [88-108] 88 (  09/01 0645) Resp:  [15-28] 15 (09/01 0645) BP: (86-122)/(44-66) 91/53 mmHg (09/01 0645) SpO2:  [90 %-99 %] 93 % (09/01 0645) Weight:  [323 lb 13.7 oz (146.9 kg)] 323 lb 13.7 oz (146.9 kg) (09/01 0105)  PHYSICAL EXAMINATION: General:  Sleeping until awoken by me. Awake. Alert. No acute distress. Morbidly obese.  Integument:  Warm & dry. No rash on exposed skin. Venous stasis changes bilateral lower extremities. Dressing in place over right knee with serous drainage. Lymphatics:  No appreciated cervical or supraclavicular lymphadenoapthy. HEENT:  Moist mucus membranes. No oral ulcers. No scleral injection or icterus. PERRL. Cardiovascular:  Regular rate. Bilateral lower extremity edema. No appreciable JVD.  Pulmonary:  Good aeration & clear to auscultation bilaterally. Symmetric chest wall expansion. No accessory muscle use on room air. Abdomen: Soft. Normal bowel sounds. Protuberant. Grossly nontender. Musculoskeletal:  Normal bulk and tone. Hand grip strength 5/5 bilaterally. No joint deformity or effusion appreciated. Neurological:  CN 2-12 grossly in tact. No meningismus. Moving all 4 extremities equally.  Psychiatric:  Mood and affect congruent. Speech normal rhythm, rate & tone.    Recent Labs Lab  01/03/15 1140 01/08/15 1841 01/09/15 0510  NA 136 136 133*  K 3.7 4.2 4.3  CL 99* 101 104  CO2 29 24 22   BUN 18 61* 53*  CREATININE 0.98 2.60* 2.18*  GLUCOSE 122* 111* 130*    Recent Labs Lab 01/03/15 1140 01/08/15 1841 01/09/15 0510  HGB 13.9 11.4* 9.9*  HCT 40.6 33.4* 29.1*  WBC 5.2 6.8 5.5  PLT 234 219 187   Dg Chest Port 1 View  01/09/2015   CLINICAL DATA:  Sepsis.  EXAM: PORTABLE CHEST - 1 VIEW  COMPARISON:  01/23/2014  FINDINGS: The heart is mildly enlarged. The left PICC line tip is in the distal SVC near the cavoatrial junction. There is streaky bibasilar atelectasis or infiltrates and possible left effusion. Mild central vascular congestion without overt pulmonary edema. The bony thorax is intact.  IMPRESSION: Streaky bibasilar airspace opacity, atelectasis versus infiltrates.  Left PICC line in the distal SVC.   Electronically Signed   By: Marijo Sanes M.D.   On: 01/09/2015 07:39    ASSESSMENT / PLAN: 59 year old male with septic shock & acute renal failure. Most likely source is the patient's MSSA involving his right knee. Cultures were obtained on admission and are pending. The patient has responded to fluid resuscitation and is currently on room air with no signs of respiratory decompensation. His lactic acid is normal and I suspect his Procalcitonin will continue to trend downward as treatment continues. The patient's clinical state remains guarded with further potential for decompensation given his multiple medical problems, but I do not feel he is critically ill to warrant transition to the intensive care unit. I did speak with the patient's nurse updating her on my plan as outlined below.  1. Septic shock: Shock resolved post IV fluid resuscitation. Likely source MSSA from his right knee. Blood and urine cultures pending. Continuing to trend Procalcitonin. Currently on clindamycin & rifampin. Defer to infectious diseases on antibiotic regimen. Checking troponin I every  6 hours & transthoracic echocardiogram.  2. Right knee MSSA: Currently on clindamycin & rifampin. Trending Procalcitonin. Defer to infectious diseases on antibiotic regimen.  3. Acute renal failure: Improving. Likely a component of both vancomycin-induced injury as well as ATN from hypotension. Recommend continuing to trend urine output along with daily BUN/creatinine.  4. Anemia: No signs of active bleeding. Likely some element of dilution  given fluid resuscitation and decrease in leukocyte count as well as platelet count. Recommend continuing to monitor hemoglobin daily.  5. Bilateral lower extremity edema: I'll change the Doppler ultrasound order of the bilateral lower extremities to stat.  6. Hypertension: Home antihypertensive medications on hold.  7. OSA: Patient noncompliant with home CPAP therapy. Recommend supplemental oxygen at night and reevaluation as an outpatient.  8. CODE STATUS: Patient confirms he is full code.  9. Prophylaxis: Recommend against the use of Lovenox in the setting of acute renal failure given the potential for systemic anticoagulation with reduced renal clearance.  At this time we will sign off. We will be available should you have any further questions or concerns. Thank you for the opportunity to help care for Mr. Turin.  Sonia Baller Ashok Cordia, M.D. [facility] Pager:  [phone] After 3pm or if no response, call [phone] 01/09/2015, 8:44 AM

## 2015-01-09 NOTE — Progress Notes (Signed)
Utilization review completed. Nasire Reali, RN, BSN. 

## 2015-01-09 NOTE — Progress Notes (Signed)
[facility]  Patient Status:   Active pt with [facility] since DC home on 01/06/15.  [facility] is providing the following services: HHRN and Home Infusion Pharmacy Team for planned IV Vancomycin at home, though pt was never able to start Vancomycin at home due sustained high trough and worsening renal function as evidenced by his SCR 2.62 and BUN 62 drawn by [facility] on 01/08/15.  Dr. Sharol Given was notified and pt readmitted.  [facility] team will follow pt while here to support transition home when ordered.  If patient discharges after hours, please call [phone].   Eddie Fletcher 01/09/2015, 1:29 PM

## 2015-01-10 DIAGNOSIS — T8450XS Infection and inflammatory reaction due to unspecified internal joint prosthesis, sequela: Secondary | ICD-10-CM

## 2015-01-10 DIAGNOSIS — S40021A Contusion of right upper arm, initial encounter: Secondary | ICD-10-CM

## 2015-01-10 DIAGNOSIS — X58XXXA Exposure to other specified factors, initial encounter: Secondary | ICD-10-CM

## 2015-01-10 LAB — RENAL FUNCTION PANEL
Albumin: 2.3 g/dL — ABNORMAL LOW (ref 3.5–5.0)
Anion gap: 7 (ref 5–15)
BUN: 31 mg/dL — ABNORMAL HIGH (ref 6–20)
CHLORIDE: 107 mmol/L (ref 101–111)
CO2: 23 mmol/L (ref 22–32)
Calcium: 8.7 mg/dL — ABNORMAL LOW (ref 8.9–10.3)
Creatinine, Ser: 1.69 mg/dL — ABNORMAL HIGH (ref 0.61–1.24)
GFR, EST AFRICAN AMERICAN: 50 mL/min — AB (ref >60)
GFR, EST NON AFRICAN AMERICAN: 43 mL/min — AB (ref >60)
Glucose, Bld: 123 mg/dL — ABNORMAL HIGH (ref 65–99)
POTASSIUM: 4.4 mmol/L (ref 3.5–5.1)
Phosphorus: 4.1 mg/dL (ref 2.5–4.6)
Sodium: 137 mmol/L (ref 135–145)

## 2015-01-10 LAB — CBC WITH DIFFERENTIAL/PLATELET
BASOS ABS: 0 10*3/uL (ref 0.0–0.1)
BASOS PCT: 0 % (ref 0–1)
EOS ABS: 0.3 10*3/uL (ref 0.0–0.7)
EOS PCT: 7 % — AB (ref 0–5)
HCT: 30.9 % — ABNORMAL LOW (ref 39.0–52.0)
Hemoglobin: 10.2 g/dL — ABNORMAL LOW (ref 13.0–17.0)
Lymphocytes Relative: 12 % (ref 12–46)
Lymphs Abs: 0.5 10*3/uL — ABNORMAL LOW (ref 0.7–4.0)
MCH: 28.1 pg (ref 26.0–34.0)
MCHC: 33 g/dL (ref 30.0–36.0)
MCV: 85.1 fL (ref 78.0–100.0)
MONO ABS: 0.7 10*3/uL (ref 0.1–1.0)
MONOS PCT: 16 % — AB (ref 3–12)
NEUTROS ABS: 2.9 10*3/uL (ref 1.7–7.7)
Neutrophils Relative %: 65 % (ref 43–77)
PLATELETS: 207 10*3/uL (ref 150–400)
RBC: 3.63 MIL/uL — ABNORMAL LOW (ref 4.22–5.81)
RDW: 13.8 % (ref 11.5–15.5)
WBC: 4.5 10*3/uL (ref 4.0–10.5)

## 2015-01-10 LAB — BASIC METABOLIC PANEL
Anion gap: 5 (ref 5–15)
BUN: 31 mg/dL — AB (ref 6–20)
CALCIUM: 8.6 mg/dL — AB (ref 8.9–10.3)
CO2: 25 mmol/L (ref 22–32)
CREATININE: 1.71 mg/dL — AB (ref 0.61–1.24)
Chloride: 107 mmol/L (ref 101–111)
GFR calc non Af Amer: 42 mL/min — ABNORMAL LOW (ref >60)
GFR, EST AFRICAN AMERICAN: 49 mL/min — AB (ref >60)
Glucose, Bld: 122 mg/dL — ABNORMAL HIGH (ref 65–99)
Potassium: 4.4 mmol/L (ref 3.5–5.1)
SODIUM: 137 mmol/L (ref 135–145)

## 2015-01-10 LAB — URINE CULTURE: CULTURE: NO GROWTH

## 2015-01-10 LAB — MAGNESIUM: MAGNESIUM: 1.5 mg/dL — AB (ref 1.7–2.4)

## 2015-01-10 LAB — HIV ANTIBODY (ROUTINE TESTING W REFLEX): HIV Screen 4th Generation wRfx: NONREACTIVE

## 2015-01-10 MED ORDER — SODIUM CHLORIDE 0.9 % IJ SOLN
10.0000 mL | INTRAMUSCULAR | Status: DC | PRN
  Administered 2015-01-10: 10 mL
  Administered 2015-01-11: 20 mL
  Administered 2015-01-11 – 2015-01-12 (×2): 10 mL
  Filled 2015-01-10 (×3): qty 40

## 2015-01-10 MED ORDER — OXYCODONE-ACETAMINOPHEN 5-325 MG PO TABS
1.0000 | ORAL_TABLET | ORAL | Status: DC | PRN
  Administered 2015-01-10: 1 via ORAL
  Filled 2015-01-10: qty 1

## 2015-01-10 NOTE — Progress Notes (Signed)
Patient ID: Eddie Fletcher, male   DOB: February 13, 1956, 59 y.o.   MRN: [id number] Patient's BUN and creatinine much improved. Patient is comfortable. Orders written for physical therapy for gait training.

## 2015-01-10 NOTE — Progress Notes (Signed)
Report called to Helene Kelp, [place] on 5W. Patient being transferred via bed to 5W13 via bed. Belongings sent with patient. Patient notified wife of him moving rooms.

## 2015-01-10 NOTE — Progress Notes (Signed)
Physical Therapy Evaluation Patient Details Name: Eddie Fletcher MRN: [id number] DOB: [birth year] Today's Date: 01/10/2015   History of Present Illness  Pt is a 59 y/o M admitted with AKI. 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  Pt's PMH includes Bsnier-Boeck disease, HTN, edema, obesity, cellulitis, Bil carpal tunnel release, Rt thumb trigger finger.  Clinical Impression  Pt is s/p TKA revision with re-admission with AKI resulting in the deficits listed below (see PT Problem List). Pt ambulated 20' with RW and min/guard assist. SaO2 86% on RA walking. Reviewed TKA exercises. R knee actively draining during activity.  Pt will benefit from skilled PT to increase their independence and safety with mobility to allow discharge to the venue listed below.      Follow Up Recommendations Home health PT;Supervision for mobility/OOB    Equipment Recommendations  None recommended by PT    Recommendations for Other Services       Precautions / Restrictions Precautions Precautions: Knee;Fall Restrictions Weight Bearing Restrictions: Yes RLE Weight Bearing: Weight bearing as tolerated      Mobility  Bed Mobility         Supine to sit: Modified independent (Device/Increase time)     General bed mobility comments: HOB up 45*, used rail  Transfers Overall transfer level: Needs assistance Equipment used: Rolling walker (2 wheeled) Transfers: Sit to/from Stand Sit to Stand: Min assist;From elevated surface         General transfer comment: increased time and effort, min A to power up  Ambulation/Gait Ambulation/Gait assistance: Min guard Ambulation Distance (Feet): 55 Feet Assistive device: Rolling walker (2 wheeled) Gait Pattern/deviations: Step-through pattern Gait velocity: decreased   General Gait Details: SaO2 86% on RA with walking, distance limited by fatigue and R knee pain, R knee actively draining, dressing is saturated, RN  notified  Stairs            Wheelchair Mobility    Modified Rankin (Stroke Patients Only)       Balance     Sitting balance-Leahy Scale: Fair       Standing balance-Leahy Scale: Poor                               Pertinent Vitals/Pain Pain Score: 5  Pain Location: R knee with walking; 2/10 back pain with walking Pain Descriptors / Indicators: Sore Pain Intervention(s): Limited activity within patient's tolerance;Monitored during session;Premedicated before session    Home Living Family/patient expects to be discharged to:: Private residence Living Arrangements: Spouse/significant other;Children (76 y/o daughter) Available Help at Discharge: Family;Available 24 hours/day (wife and daughter (wife doesn't work)) Type of Home: House Home Access: [place]: One [place]: Crutches;Cane - single point;Walker - 2 wheels      Prior Function Level of Independence: Independent with assistive device(s)         Comments: PTA used cane for short distances and 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     Hand Dominance   Dominant Hand: Right    Extremity/Trunk Assessment   Upper Extremity Assessment: Overall WFL for tasks assessed           Lower Extremity Assessment: RLE deficits/detail RLE Deficits / Details: s/p revision of Rt TKA w/ resultant limited ROM and strength, knee extension +3/5, AAROM R knee 5-60*, SLR -3/5    Cervical / Trunk  Assessment: Normal  Communication   Communication: No difficulties  Cognition Arousal/Alertness: Awake/alert Behavior During Therapy: WFL for tasks assessed/performed Overall Cognitive Status: Within Functional Limits for tasks assessed                      General Comments      Exercises Total Joint Exercises Ankle Circles/Pumps: AROM;Both;10 reps;Seated Quad Sets: Both;10 reps;Strengthening;Seated Short Arc Quad[place]clair Ship;Right;10 reps;Supine Heel  Slides: AAROM;Right;10 reps;Supine Straight Leg Raises: AAROM;Right;10 reps;Supine Long Arc Quad: AAROM;Right;10 reps;Seated Goniometric ROM: 5-60* R knee AAROM      Assessment/Plan    PT Assessment Patient needs continued PT services  PT Diagnosis Difficulty walking;Acute pain   PT Problem List Decreased strength;Decreased range of motion;Decreased activity tolerance;Decreased balance;Decreased mobility;Decreased knowledge of precautions;Obesity;Decreased skin integrity;Pain  PT Treatment Interventions Stair training;Gait training;Functional mobility training;Therapeutic activities;Therapeutic exercise;Balance training;Neuromuscular re-education;Patient/family education   PT Goals (Current goals can be found in the Care Plan section) Acute Rehab PT Goals Patient Stated Goal: to return home, does not want to go back to a SNF PT Goal Formulation: With patient Time For Goal Achievement: 01/24/15 Potential to Achieve Goals: Good    Frequency 7X/week   Barriers to discharge        Co-evaluation               End of Session Equipment Utilized During Treatment: Gait belt Activity Tolerance: Patient limited by fatigue;Patient limited by pain Patient left: with call bell/phone within reach;in chair Nurse Communication: Mobility status         Time: [id number] PT Time Calculation (min) (ACUTE ONLY): 30 min   Charges:   PT Evaluation $Initial PT Evaluation Tier I: 1 Procedure PT Treatments $Gait Training: 8-22 mins   PT G Codes:        Philomena Doheny 01/10/2015, 1:59 PM [phone]

## 2015-01-10 NOTE — Progress Notes (Signed)
INFECTIOUS DISEASE PROGRESS NOTE  ID: Eddie Fletcher is a 59 y.o. male with  Active Problems:   AKI (acute kidney injury)   Hypotension   Septic joint of right knee joint   Infected prosthetic knee joint   Sepsis due to Staphylococcus aureus   Elevated LFTs   Acute renal failure  Subjective: Complains of bruise on R medial arm.  No pruritis, rash.   Abtx:  Anti-infectives    Start     Dose/Rate Route Frequency Ordered Stop   01/09/15 1400  ceFAZolin (ANCEF) IVPB 1 g/50 mL premix     1 g 100 mL/hr over 30 Minutes Intravenous 3 times per day 01/09/15 1105     01/09/15 0109  rifampin (RIFADIN) capsule 300 mg     300 mg Oral 2 times daily 01/09/15 0109     01/09/15 0109  clindamycin (CLEOCIN) IVPB 600 mg  Status:  Discontinued     600 mg 100 mL/hr over 30 Minutes Intravenous 3 times per day 01/09/15 0109 01/09/15 1104      Medications:  Scheduled: .  ceFAZolin (ANCEF) IV  1 g Intravenous 3 times per day  . enoxaparin (LOVENOX) injection  70 mg Subcutaneous Q24H  . fluticasone  1 spray Each Nare Daily  . pantoprazole  40 mg Oral Daily  . rifampin  300 mg Oral BID  . saccharomyces boulardii  250 mg Oral BID    Objective: Vital signs in last 24 hours: Temp:  [97.8 F (36.6 C)-98.7 F (37.1 C)] 98 F (36.7 C) (09/02 0750) Pulse Rate:  [85-93] 85 (09/02 0441) Resp:  [15-18] 18 (09/02 0441) BP: (99-107)/(54-63) 107/63 mmHg (09/02 0441) SpO2:  [92 %-99 %] 92 % (09/02 0441) Weight:  [147.102 kg (324 lb 4.8 oz)] 147.102 kg (324 lb 4.8 oz) (09/02 0441)   General appearance: alert, cooperative and no distress Extremities: R LE wrapped, d/c noted on superior portion of dressnig. PIC LUE.  Skin: bruisng R medial UE.   Lab Results  Recent Labs  01/09/15 0510 01/10/15 0445  WBC 5.5 4.5  HGB 9.9* 10.2*  HCT 29.1* 30.9*  NA 133* 137  137  K 4.3 4.4  4.4  CL 104 107  107  CO2 22 25  23   BUN 53* 31*  31*  CREATININE 2.18* 1.71*  1.69*   Liver  Panel  Recent Labs  01/09/15 0510 01/09/15 1400 01/10/15 0445  PROT 6.3* 8.0  --   ALBUMIN 2.2* 2.5* 2.3*  AST 34 36  --   ALT 23 26  --   ALKPHOS 89 90  --   BILITOT 1.1 1.2  --   BILIDIR 0.4 0.6*  --   IBILI 0.7 0.6  --    Sedimentation Rate  Recent Labs  01/08/15 2148  ESRSEDRATE 93*   C-Reactive Protein  Recent Labs  01/08/15 2148  CRP 17.0*    Microbiology: Recent Results (from the past 240 hour(s))  Anaerobic culture     Status: None   Collection Time: 01/03/15  3:54 PM  Result Value Ref Range Status   Specimen Description TISSUE RIGHT KNEE  Final   Special Requests SPEC A  Final   Gram Stain   Final    FEW WBC PRESENT,BOTH PMN AND MONONUCLEAR NO ORGANISMS SEEN Performed at [facility]    Culture   Final    NO ANAEROBES ISOLATED Performed at [facility]    Report Status 01/08/2015 FINAL  Final  Tissue  culture     Status: None   Collection Time: 01/03/15  3:58 PM  Result Value Ref Range Status   Specimen Description TISSUE RIGHT KNEE  Final   Special Requests SPEC A  Final   Gram Stain   Final    NO WBC SEEN NO ORGANISMS SEEN Performed at [facility]    Culture   Final    FEW STAPHYLOCOCCUS AUREUS Performed at [facility]    Report Status 01/06/2015 FINAL  Final   Organism ID, Bacteria STAPHYLOCOCCUS AUREUS  Final      Susceptibility   Staphylococcus aureus - MIC*    CLINDAMYCIN <=0.25 SENSITIVE Sensitive     ERYTHROMYCIN 0.5 SENSITIVE Sensitive     GENTAMICIN <=0.5 SENSITIVE Sensitive     LEVOFLOXACIN <=0.12 SENSITIVE Sensitive     OXACILLIN <=0.25 SENSITIVE Sensitive     RIFAMPIN <=0.5 SENSITIVE Sensitive     TRIMETH/SULFA <=10 SENSITIVE Sensitive     VANCOMYCIN 1 SENSITIVE Sensitive     TETRACYCLINE <=1 SENSITIVE Sensitive     MOXIFLOXACIN <=0.25 SENSITIVE Sensitive     * FEW STAPHYLOCOCCUS AUREUS  Tissue culture     Status: None   Collection Time: 01/03/15  4:05 PM  Result Value Ref Range  Status   Specimen Description TISSUE RIGHT KNEE  Final   Special Requests SPEC B  Final   Gram Stain   Final    NO WBC SEEN NO ORGANISMS SEEN Performed at [facility]    Culture   Final    NO GROWTH 3 DAYS Performed at [facility]    Report Status 01/07/2015 FINAL  Final  Urine culture     Status: None   Collection Time: 01/08/15  8:44 PM  Result Value Ref Range Status   Specimen Description URINE, CLEAN CATCH  Final   Special Requests NONE  Final   Culture NO GROWTH 2 DAYS  Final   Report Status 01/10/2015 FINAL  Final  Blood Culture (routine x 2)     Status: None (Preliminary result)   Collection Time: 01/08/15  8:57 PM  Result Value Ref Range Status   Specimen Description BLOOD RIGHT HAND  Final   Special Requests BOTTLES DRAWN AEROBIC AND ANAEROBIC 3CC  Final   Culture NO GROWTH < 24 HOURS  Final   Report Status PENDING  Incomplete  Blood Culture (routine x 2)     Status: None (Preliminary result)   Collection Time: 01/08/15  9:40 PM  Result Value Ref Range Status   Specimen Description BLOOD RIGHT ARM  Final   Special Requests BOTTLES DRAWN AEROBIC ONLY 5CC  Final   Culture NO GROWTH < 24 HOURS  Final   Report Status PENDING  Incomplete  MRSA PCR Screening     Status: None   Collection Time: 01/09/15  2:18 AM  Result Value Ref Range Status   MRSA by PCR NEGATIVE NEGATIVE Final    Comment:        The GeneXpert MRSA Assay (FDA approved for NASAL specimens only), is one component of a comprehensive MRSA colonization surveillance program. It is not intended to diagnose MRSA infection nor to guide or monitor treatment for MRSA infections.     Studies/Results: US Abdomen Complete  01/09/2015   CLINICAL DATA:  Elevated liver function tests.  Acute renal failure.  EXAM: ULTRASOUND ABDOMEN COMPLETE  COMPARISON:  05/01/2004 CTA  FINDINGS: Gallbladder: No gallstones or wall thickening visualized. No sonographic Murphy sign noted.  Common  bile duct:  Diameter: Normal, 4 mm.  Liver: Increased echogenicity.  IVC: No abnormality visualized.  Pancreas: Poorly visualized due to overlying bowel gas.  Spleen: Mild splenomegaly, 14 cm craniocaudal.  Right Kidney: Length: 12.8 cm. Poorly visualized due to overlying bowel gas. Suspect mild pelvicaliectasis.  Left Kidney: Length: 12.7 cm. Poorly visualized due to overlying bowel gas.  Abdominal aorta: No gross aneurysm identified.  Other findings: No ascites.  Exam degraded by patient morbid obesity.  IMPRESSION: 1. Moderate degradation secondary to patient body habitus. 2. Hepatic steatosis. No other explanation for elevated liver function tests. 3. Suspect mild pelvicaliectasis. Consider further evaluation with abdominal pelvic CT.   Electronically Signed   By: Abigail Miyamoto M.D.   On: 01/09/2015 13:30   Dg Chest Port 1 View  01/09/2015   CLINICAL DATA:  Sepsis.  EXAM: PORTABLE CHEST - 1 VIEW  COMPARISON:  01/23/2014  FINDINGS: The heart is mildly enlarged. The left PICC line tip is in the distal SVC near the cavoatrial junction. There is streaky bibasilar atelectasis or infiltrates and possible left effusion. Mild central vascular congestion without overt pulmonary edema. The bony thorax is intact.  IMPRESSION: Streaky bibasilar airspace opacity, atelectasis versus infiltrates.  Left PICC line in the distal SVC.   Electronically Signed   By: Marijo Sanes M.D.   On: 01/09/2015 07:39     Assessment/Plan: Infected R TKR (MSSA)  He is tolerating the ancef well despite his hx of keflex  allergy  No change in anbx.   Ortho to f/u wound drainage  He has PIC.   Plan for 42 days of anbx   ARF  Cr is improved.   Continue to watch.  Bruising  Reassurance.   Total days of antibiotics: 8 (ancef/rif)  Dr Tommy Medal available if questions over weekend.          Bobby Rumpf Infectious Diseases (pager) [phone] [email or web address] 01/10/2015, 10:34 AM  LOS: 2 days

## 2015-01-10 NOTE — Progress Notes (Addendum)
PROGRESS NOTE  Eddie Fletcher [id number] DOB: [birth year] DOA: 01/08/2015 PCP: Curly Rim, MD  Brief History 59 year old male with a history of hypertension, GERD presented to the emergency department secondary to being notified of abnormal labs, notably increased serum creatinine. The patient was recently discharged from the hospital on 01/06/2015 after revision and exchange of his polyethylene liner on his right knee prosthesis. The patient initially had a right total knee arthroplasty on 01/30/2014. He did well for a period of time. Approximately one week prior to his revision, around 12/30/2014, the patient noted increasing swelling, pain, and subsequent drainage from his right knee. He was placed on oral doxycycline in the outpatient setting and subsequently admitted to the hospital. Intraoperative cultures from his knee revision grew MSSA. The patient was discharged home on vancomycin as a result of his allergy to cephalexin. In addition, the patient took 2 days of rifampin. Assessment/Plan: Sepsis -Secondary to infected right knee arthroplasty -Blood cultures 2 sets--neg to date -Continue intravenous fluids -Continue intravenous antibiotics -Procalcitonin 28.8 -Lactic acid 1.07 -Urinalysis negative for polyuria Infected right knee arthroplasty-MSSA -consult ID--start cefazolin--patient is tolerating -concerned he may ultimately need explantation -continue IV abx -continue rifampin  -Dr. Sharol Given following Acute kidney injury -This may be multifactorial including the patient's recent use of ibuprofen, intravenous vancomycin, as well as his sepsis in the setting of ARB and HCTZ use -01/06/2015 random vancomycin level 29; however, random Vanco level on 01/08/2015--11 -baseline 0.7-1.0 -am BMP Right leg pain and edema -Obtain duplex r/o DVT--neg DVT bilateral Hypertension -Hold ARB in the setting of renal failure and soft blood pressures -d/c  HCTZ Hyperglycemia -Check hemoglobin A1c  Dispo--home in 1-2 days; transfer to medsurg  Procedures/Studies: US Abdomen Complete  01/09/2015   CLINICAL DATA:  Elevated liver function tests.  Acute renal failure.  EXAM: ULTRASOUND ABDOMEN COMPLETE  COMPARISON:  05/01/2004 CTA  FINDINGS: Gallbladder: No gallstones or wall thickening visualized. No sonographic Murphy sign noted.  Common bile duct: Diameter: Normal, 4 mm.  Liver: Increased echogenicity.  IVC: No abnormality visualized.  Pancreas: Poorly visualized due to overlying bowel gas.  Spleen: Mild splenomegaly, 14 cm craniocaudal.  Right Kidney: Length: 12.8 cm. Poorly visualized due to overlying bowel gas. Suspect mild pelvicaliectasis.  Left Kidney: Length: 12.7 cm. Poorly visualized due to overlying bowel gas.  Abdominal aorta: No gross aneurysm identified.  Other findings: No ascites.  Exam degraded by patient morbid obesity.  IMPRESSION: 1. Moderate degradation secondary to patient body habitus. 2. Hepatic steatosis. No other explanation for elevated liver function tests. 3. Suspect mild pelvicaliectasis. Consider further evaluation with abdominal pelvic CT.   Electronically Signed   By: Abigail Miyamoto M.D.   On: 01/09/2015 13:30   Dg Chest Port 1 View  01/09/2015   CLINICAL DATA:  Sepsis.  EXAM: PORTABLE CHEST - 1 VIEW  COMPARISON:  01/23/2014  FINDINGS: The heart is mildly enlarged. The left PICC line tip is in the distal SVC near the cavoatrial junction. There is streaky bibasilar atelectasis or infiltrates and possible left effusion. Mild central vascular congestion without overt pulmonary edema. The bony thorax is intact.  IMPRESSION: Streaky bibasilar airspace opacity, atelectasis versus infiltrates.  Left PICC line in the distal SVC.   Electronically Signed   By: Marijo Sanes M.D.   On: 01/09/2015 07:39         Subjective: Patient denies fevers, chills, headache, chest pain, dyspnea, nausea, vomiting, diarrhea, abdominal pain,  dysuria,  hematuria   Objective: Filed Vitals:   01/09/15 2308 01/10/15 0441 01/10/15 0750 01/10/15 1141  BP: 99/59 107/63    Pulse: 90 85    Temp: 98.1 F (36.7 C) 97.8 F (36.6 C) 98 F (36.7 C) 98.7 F (37.1 C)  TempSrc: Oral Oral Oral Oral  Resp: 15 18    Height:  5\' 8"  (1.727 m)    Weight:  147.102 kg (324 lb 4.8 oz)    SpO2: 93% 92%      Intake/Output Summary (Last 24 hours) at 01/10/15 1342 Last data filed at 01/10/15 1130  Gross per 24 hour  Intake      0 ml  Output   3027 ml  Net  -3027 ml   Weight change: 0.202 kg (7.1 oz) Exam:   General:  Pt is alert, follows commands appropriately, not in acute distress  HEENT: No icterus, No thrush, No neck mass, [place]/AT  Cardiovascular: RRR, S1/S2, no rubs, no gallops  Respiratory: CTA bilaterally, no wheezing, no crackles, no rhonchi  Abdomen: Soft/+BS, non tender, non distended, no guarding  Extremities: 1+LE edema, No lymphangitis, No petechiae, No rashes; right knee with white drainage without any blood.  Data Reviewed: Basic Metabolic Panel:  Recent Labs Lab 01/08/15 1841 01/09/15 0510 01/10/15 0445  NA 136 133* 137  137  K 4.2 4.3 4.4  4.4  CL 101 104 107  107  CO2 24 22 25  23   GLUCOSE 111* 130* 122*  123*  BUN 61* 53* 31*  31*  CREATININE 2.60* 2.18* 1.71*  1.69*  CALCIUM 10.3 8.6* 8.6*  8.7*  MG  --   --  1.5*  PHOS  --   --  4.1   Liver Function Tests:  Recent Labs Lab 01/08/15 1841 01/09/15 0510 01/09/15 1400 01/10/15 0445  AST 44* 34 36  --   ALT 30 23 26   --   ALKPHOS 103 89 90  --   BILITOT 1.4* 1.1 1.2  --   PROT 8.4* 6.3* 8.0  --   ALBUMIN 2.7* 2.2* 2.5* 2.3*   No results for input(s): LIPASE, AMYLASE in the last 168 hours. No results for input(s): AMMONIA in the last 168 hours. CBC:  Recent Labs Lab 01/08/15 1841 01/08/15 2148 01/09/15 0510 01/10/15 0445  WBC 6.8  --  5.5 4.5  NEUTROABS  --  5.0 3.7 2.9  HGB 11.4*  --  9.9* 10.2*  HCT 33.4*  --  29.1* 30.9*   MCV 83.7  --  84.8 85.1  PLT 219  --  187 207   Cardiac Enzymes:  Recent Labs Lab 01/09/15 0900 01/09/15 1400 01/09/15 2120  TROPONINI <0.03 <0.03 <0.03   BNP: Invalid input(s): POCBNP CBG:  Recent Labs Lab 01/08/15 2232  GLUCAP 97    Recent Results (from the past 240 hour(s))  Anaerobic culture     Status: None   Collection Time: 01/03/15  3:54 PM  Result Value Ref Range Status   Specimen Description TISSUE RIGHT KNEE  Final   Special Requests SPEC A  Final   Gram Stain   Final    FEW WBC PRESENT,BOTH PMN AND MONONUCLEAR NO ORGANISMS SEEN Performed at [facility]    Culture   Final    NO ANAEROBES ISOLATED Performed at [facility]    Report Status 01/08/2015 FINAL  Final  Tissue culture     Status: None   Collection Time: 01/03/15  3:58 PM  Result Value Ref Range  Status   Specimen Description TISSUE RIGHT KNEE  Final   Special Requests SPEC A  Final   Gram Stain   Final    NO WBC SEEN NO ORGANISMS SEEN Performed at [facility]    Culture   Final    FEW STAPHYLOCOCCUS AUREUS Performed at [facility]    Report Status 01/06/2015 FINAL  Final   Organism ID, Bacteria STAPHYLOCOCCUS AUREUS  Final      Susceptibility   Staphylococcus aureus - MIC*    CLINDAMYCIN <=0.25 SENSITIVE Sensitive     ERYTHROMYCIN 0.5 SENSITIVE Sensitive     GENTAMICIN <=0.5 SENSITIVE Sensitive     LEVOFLOXACIN <=0.12 SENSITIVE Sensitive     OXACILLIN <=0.25 SENSITIVE Sensitive     RIFAMPIN <=0.5 SENSITIVE Sensitive     TRIMETH/SULFA <=10 SENSITIVE Sensitive     VANCOMYCIN 1 SENSITIVE Sensitive     TETRACYCLINE <=1 SENSITIVE Sensitive     MOXIFLOXACIN <=0.25 SENSITIVE Sensitive     * FEW STAPHYLOCOCCUS AUREUS  Tissue culture     Status: None   Collection Time: 01/03/15  4:05 PM  Result Value Ref Range Status   Specimen Description TISSUE RIGHT KNEE  Final   Special Requests SPEC B  Final   Gram Stain   Final    NO WBC SEEN NO ORGANISMS  SEEN Performed at [facility]    Culture   Final    NO GROWTH 3 DAYS Performed at [facility]    Report Status 01/07/2015 FINAL  Final  Urine culture     Status: None   Collection Time: 01/08/15  8:44 PM  Result Value Ref Range Status   Specimen Description URINE, CLEAN CATCH  Final   Special Requests NONE  Final   Culture NO GROWTH 2 DAYS  Final   Report Status 01/10/2015 FINAL  Final  Blood Culture (routine x 2)     Status: None (Preliminary result)   Collection Time: 01/08/15  8:57 PM  Result Value Ref Range Status   Specimen Description BLOOD RIGHT HAND  Final   Special Requests BOTTLES DRAWN AEROBIC AND ANAEROBIC 3CC  Final   Culture NO GROWTH < 24 HOURS  Final   Report Status PENDING  Incomplete  Blood Culture (routine x 2)     Status: None (Preliminary result)   Collection Time: 01/08/15  9:40 PM  Result Value Ref Range Status   Specimen Description BLOOD RIGHT ARM  Final   Special Requests BOTTLES DRAWN AEROBIC ONLY 5CC  Final   Culture NO GROWTH < 24 HOURS  Final   Report Status PENDING  Incomplete  MRSA PCR Screening     Status: None   Collection Time: 01/09/15  2:18 AM  Result Value Ref Range Status   MRSA by PCR NEGATIVE NEGATIVE Final    Comment:        The GeneXpert MRSA Assay (FDA approved for NASAL specimens only), is one component of a comprehensive MRSA colonization surveillance program. It is not intended to diagnose MRSA infection nor to guide or monitor treatment for MRSA infections.      Scheduled Meds: .  ceFAZolin (ANCEF) IV  1 g Intravenous 3 times per day  . enoxaparin (LOVENOX) injection  70 mg Subcutaneous Q24H  . fluticasone  1 spray Each Nare Daily  . pantoprazole  40 mg Oral Daily  . rifampin  300 mg Oral BID  . saccharomyces boulardii  250 mg Oral BID   Continuous Infusions:  Vedanshi Massaro, DO  [facility] Pager [phone]  If 7PM-7AM, please contact night-coverage [email or web address] Password  [email or web address] 01/10/2015, 1:43 PM   LOS: 2 days

## 2015-01-11 LAB — BASIC METABOLIC PANEL
Anion gap: 6 (ref 5–15)
BUN: 21 mg/dL — AB (ref 6–20)
CO2: 26 mmol/L (ref 22–32)
Calcium: 8.8 mg/dL — ABNORMAL LOW (ref 8.9–10.3)
Chloride: 103 mmol/L (ref 101–111)
Creatinine, Ser: 1.53 mg/dL — ABNORMAL HIGH (ref 0.61–1.24)
GFR calc Af Amer: 56 mL/min — ABNORMAL LOW (ref >60)
GFR, EST NON AFRICAN AMERICAN: 48 mL/min — AB (ref >60)
Glucose, Bld: 120 mg/dL — ABNORMAL HIGH (ref 65–99)
POTASSIUM: 4.2 mmol/L (ref 3.5–5.1)
SODIUM: 135 mmol/L (ref 135–145)

## 2015-01-11 LAB — PROCALCITONIN: PROCALCITONIN: 0.22 ng/mL

## 2015-01-11 MED ORDER — TAMSULOSIN HCL 0.4 MG PO CAPS
0.4000 mg | ORAL_CAPSULE | Freq: Every day | ORAL | Status: DC
  Administered 2015-01-11: 0.4 mg via ORAL
  Filled 2015-01-11: qty 1

## 2015-01-11 MED ORDER — RIFAMPIN 300 MG PO CAPS: 300.0000 mg | ORAL_CAPSULE | Freq: Two times a day (BID) | ORAL | Status: DC

## 2015-01-11 MED ORDER — CEFAZOLIN SODIUM 1-5 GM-% IV SOLN: 2.0000 g | Freq: Three times a day (TID) | INTRAVENOUS | Status: DC

## 2015-01-11 MED ORDER — SODIUM CHLORIDE 0.9 % IV SOLN
INTRAVENOUS | Status: DC
  Administered 2015-01-11: 10:00:00 via INTRAVENOUS

## 2015-01-11 NOTE — Progress Notes (Signed)
Patient ID: Eddie Fletcher, male   DOB: 10/02/1955, 59 y.o.   MRN: [id number] Renal function continues to improve. Still some drainage but this has decreased. Anticipate discharge to home in a day or 2 on the oral and IV antibiotics. I will follow-up in the office next week.

## 2015-01-11 NOTE — Progress Notes (Signed)
PROGRESS NOTE  Eddie Fletcher [id number] DOB: [birth year] DOA: 01/08/2015 PCP: Curly Rim, MD  Brief History 59 year old male with a history of hypertension, GERD presented to the emergency department secondary to being notified of abnormal labs, notably increased serum creatinine. The patient was recently discharged from the hospital on 01/06/2015 after revision and exchange of his polyethylene liner on his right knee prosthesis. The patient initially had a right total knee arthroplasty on 01/30/2014. He did well for a period of time. Approximately one week prior to his revision, around 12/30/2014, the patient noted increasing swelling, pain, and subsequent drainage from his right knee. He was placed on oral doxycycline in the outpatient setting and subsequently admitted to the hospital. Intraoperative cultures from his knee revision grew MSSA. The patient was discharged home on vancomycin as a result of his allergy to cephalexin. In addition, the patient took 2 days of rifampin. Assessment/Plan: Sepsis -Secondary to infected right knee arthroplasty -Blood cultures 2 sets--neg to date -restart intravenous fluids -Continue intravenous antibiotics -Procalcitonin 28.8-->0.22 -Lactic acid 1.07 -Urinalysis negative for pyuria Infected right knee arthroplasty-MSSA -consult ID--continue cefazolin--patient is tolerating -concerned he may ultimately need explantation -continue IV abx -continue rifampin  -Dr. Sharol Given following Acute kidney injury -This may be multifactorial including the patient's recent use of ibuprofen, intravenous vancomycin, as well as his sepsis in the setting of ARB and HCTZ use -01/06/2015 random vancomycin level 29; however, random Vanco level on 01/08/2015--11 -baseline 0.7-1.0 -am BMP Right leg pain and edema -Obtain duplex r/o DVT--neg DVT bilateral Hypertension -Hold ARB in the setting of renal failure and soft blood pressures -d/c  HCTZ Hyperglycemia -Check hemoglobin A1c--pending  Dispo--home 01/12/15 if stable    Procedures/Studies: US Abdomen Complete  01/09/2015   CLINICAL DATA:  Elevated liver function tests.  Acute renal failure.  EXAM: ULTRASOUND ABDOMEN COMPLETE  COMPARISON:  05/01/2004 CTA  FINDINGS: Gallbladder: No gallstones or wall thickening visualized. No sonographic Murphy sign noted.  Common bile duct: Diameter: Normal, 4 mm.  Liver: Increased echogenicity.  IVC: No abnormality visualized.  Pancreas: Poorly visualized due to overlying bowel gas.  Spleen: Mild splenomegaly, 14 cm craniocaudal.  Right Kidney: Length: 12.8 cm. Poorly visualized due to overlying bowel gas. Suspect mild pelvicaliectasis.  Left Kidney: Length: 12.7 cm. Poorly visualized due to overlying bowel gas.  Abdominal aorta: No gross aneurysm identified.  Other findings: No ascites.  Exam degraded by patient morbid obesity.  IMPRESSION: 1. Moderate degradation secondary to patient body habitus. 2. Hepatic steatosis. No other explanation for elevated liver function tests. 3. Suspect mild pelvicaliectasis. Consider further evaluation with abdominal pelvic CT.   Electronically Signed   By: Abigail Miyamoto M.D.   On: 01/09/2015 13:30   Dg Chest Port 1 View  01/09/2015   CLINICAL DATA:  Sepsis.  EXAM: PORTABLE CHEST - 1 VIEW  COMPARISON:  01/23/2014  FINDINGS: The heart is mildly enlarged. The left PICC line tip is in the distal SVC near the cavoatrial junction. There is streaky bibasilar atelectasis or infiltrates and possible left effusion. Mild central vascular congestion without overt pulmonary edema. The bony thorax is intact.  IMPRESSION: Streaky bibasilar airspace opacity, atelectasis versus infiltrates.  Left PICC line in the distal SVC.   Electronically Signed   By: Marijo Sanes M.D.   On: 01/09/2015 07:39         Subjective: Patient denies fevers, chills, headache, chest pain, dyspnea, nausea, vomiting, diarrhea, abdominal pain, dysuria,  hematuria  Objective: Filed Vitals:   01/10/15 1509 01/10/15 1620 01/10/15 2140 01/11/15 0605  BP: 104/60 99/61 113/60 118/43  Pulse: 90 84 95 82  Temp: 98.4 F (36.9 C) 98.4 F (36.9 C) 99.1 F (37.3 C) 98.5 F (36.9 C)  TempSrc: Oral Oral Oral Oral  Resp: 18 18 20 18   Height:  5\' 8"  (1.727 m)    Weight:  146.149 kg (322 lb 3.2 oz)  145.1 kg (319 lb 14.2 oz)  SpO2: 95% 100% 97% 95%    Intake/Output Summary (Last 24 hours) at 01/11/15 0909 Last data filed at 01/11/15 0839  Gross per 24 hour  Intake    870 ml  Output   2750 ml  Net  -1880 ml   Weight change: -0.953 kg (-2 lb 1.6 oz) Exam:   General:  Pt is alert, follows commands appropriately, not in acute distress  HEENT: No icterus, No thrush, No neck mass, [place]/AT  Cardiovascular: RRR, S1/S2, no rubs, no gallops  Respiratory: CTA bilaterally, no wheezing, no crackles, no rhonchi  Abdomen: Soft/+BS, non tender, non distended, no guarding  Extremities: 1+ LE edema, No lymphangitis, No petechiae, No rashes, no synovitis; right knee well approximated with small amount of white drainage. No erythema or crepitance.  Data Reviewed: Basic Metabolic Panel:  Recent Labs Lab 01/08/15 1841 01/09/15 0510 01/10/15 0445 01/11/15 0500  NA 136 133* 137  137 135  K 4.2 4.3 4.4  4.4 4.2  CL 101 104 107  107 103  CO2 24 22 25  23 26   GLUCOSE 111* 130* 122*  123* 120*  BUN 61* 53* 31*  31* 21*  CREATININE 2.60* 2.18* 1.71*  1.69* 1.53*  CALCIUM 10.3 8.6* 8.6*  8.7* 8.8*  MG  --   --  1.5*  --   PHOS  --   --  4.1  --    Liver Function Tests:  Recent Labs Lab 01/08/15 1841 01/09/15 0510 01/09/15 1400 01/10/15 0445  AST 44* 34 36  --   ALT 30 23 26   --   ALKPHOS 103 89 90  --   BILITOT 1.4* 1.1 1.2  --   PROT 8.4* 6.3* 8.0  --   ALBUMIN 2.7* 2.2* 2.5* 2.3*   No results for input(s): LIPASE, AMYLASE in the last 168 hours. No results for input(s): AMMONIA in the last 168 hours. CBC:  Recent Labs Lab  01/08/15 1841 01/08/15 2148 01/09/15 0510 01/10/15 0445  WBC 6.8  --  5.5 4.5  NEUTROABS  --  5.0 3.7 2.9  HGB 11.4*  --  9.9* 10.2*  HCT 33.4*  --  29.1* 30.9*  MCV 83.7  --  84.8 85.1  PLT 219  --  187 207   Cardiac Enzymes:  Recent Labs Lab 01/09/15 0900 01/09/15 1400 01/09/15 2120  TROPONINI <0.03 <0.03 <0.03   BNP: Invalid input(s): POCBNP CBG:  Recent Labs Lab 01/08/15 2232  GLUCAP 97    Recent Results (from the past 240 hour(s))  Anaerobic culture     Status: None   Collection Time: 01/03/15  3:54 PM  Result Value Ref Range Status   Specimen Description TISSUE RIGHT KNEE  Final   Special Requests SPEC A  Final   Gram Stain   Final    FEW WBC PRESENT,BOTH PMN AND MONONUCLEAR NO ORGANISMS SEEN Performed at [facility]    Culture   Final    NO ANAEROBES ISOLATED Performed at [facility]    Report  Status 01/08/2015 FINAL  Final  Tissue culture     Status: None   Collection Time: 01/03/15  3:58 PM  Result Value Ref Range Status   Specimen Description TISSUE RIGHT KNEE  Final   Special Requests SPEC A  Final   Gram Stain   Final    NO WBC SEEN NO ORGANISMS SEEN Performed at [facility]    Culture   Final    FEW STAPHYLOCOCCUS AUREUS Performed at [facility]    Report Status 01/06/2015 FINAL  Final   Organism ID, Bacteria STAPHYLOCOCCUS AUREUS  Final      Susceptibility   Staphylococcus aureus - MIC*    CLINDAMYCIN <=0.25 SENSITIVE Sensitive     ERYTHROMYCIN 0.5 SENSITIVE Sensitive     GENTAMICIN <=0.5 SENSITIVE Sensitive     LEVOFLOXACIN <=0.12 SENSITIVE Sensitive     OXACILLIN <=0.25 SENSITIVE Sensitive     RIFAMPIN <=0.5 SENSITIVE Sensitive     TRIMETH/SULFA <=10 SENSITIVE Sensitive     VANCOMYCIN 1 SENSITIVE Sensitive     TETRACYCLINE <=1 SENSITIVE Sensitive     MOXIFLOXACIN <=0.25 SENSITIVE Sensitive     * FEW STAPHYLOCOCCUS AUREUS  Tissue culture     Status: None   Collection Time: 01/03/15  4:05 PM   Result Value Ref Range Status   Specimen Description TISSUE RIGHT KNEE  Final   Special Requests SPEC B  Final   Gram Stain   Final    NO WBC SEEN NO ORGANISMS SEEN Performed at [facility]    Culture   Final    NO GROWTH 3 DAYS Performed at [facility]    Report Status 01/07/2015 FINAL  Final  Urine culture     Status: None   Collection Time: 01/08/15  8:44 PM  Result Value Ref Range Status   Specimen Description URINE, CLEAN CATCH  Final   Special Requests NONE  Final   Culture NO GROWTH 2 DAYS  Final   Report Status 01/10/2015 FINAL  Final  Blood Culture (routine x 2)     Status: None (Preliminary result)   Collection Time: 01/08/15  8:57 PM  Result Value Ref Range Status   Specimen Description BLOOD RIGHT HAND  Final   Special Requests BOTTLES DRAWN AEROBIC AND ANAEROBIC 3CC  Final   Culture NO GROWTH 2 DAYS  Final   Report Status PENDING  Incomplete  Blood Culture (routine x 2)     Status: None (Preliminary result)   Collection Time: 01/08/15  9:40 PM  Result Value Ref Range Status   Specimen Description BLOOD RIGHT ARM  Final   Special Requests BOTTLES DRAWN AEROBIC ONLY 5CC  Final   Culture NO GROWTH 2 DAYS  Final   Report Status PENDING  Incomplete  MRSA PCR Screening     Status: None   Collection Time: 01/09/15  2:18 AM  Result Value Ref Range Status   MRSA by PCR NEGATIVE NEGATIVE Final    Comment:        The GeneXpert MRSA Assay (FDA approved for NASAL specimens only), is one component of a comprehensive MRSA colonization surveillance program. It is not intended to diagnose MRSA infection nor to guide or monitor treatment for MRSA infections.      Scheduled Meds: .  ceFAZolin (ANCEF) IV  1 g Intravenous 3 times per day  . enoxaparin (LOVENOX) injection  70 mg Subcutaneous Q24H  . fluticasone  1 spray Each Nare Daily  . pantoprazole  40  mg Oral Daily  . rifampin  300 mg Oral BID  . saccharomyces boulardii  250 mg Oral BID    Continuous Infusions: . sodium chloride       Stephens Shreve, DO  [facility] Pager [phone]  If 7PM-7AM, please contact night-coverage [email or web address] Password [email or web address] 01/11/2015, 9:09 AM   LOS: 3 days

## 2015-01-11 NOTE — Discharge Summary (Signed)
Physician Discharge Summary  Eddie Fletcher RKY:[id number] DOB: [birth year] DOA: 01/08/2015  PCP: Curly Rim, MD  Admit date: 01/08/2015 Discharge date: 01/12/15 Recommendations for Outpatient Follow-up:  1. Pt will need to follow up with PCP in 2 weeks post discharge 2. Please obtain CRP, ESR, CBC, BMP every 7 days by home health services starting 01/15/15 3. Continue cefazolin 2 g IV every 8 hours through 02/20/2015 4. Discontinue PICC line after last dose of intravenous antibiotics 5. Please follow up results of hemoglobin A1c  Discharge Diagnoses:  Sepsis -Secondary to infected right knee arthroplasty -Blood cultures 2 sets--neg to date -restart intravenous fluids -Continue intravenous antibiotics -Procalcitonin 28.8-->0.22 -Lactic acid 1.07 -Urinalysis negative for pyuria -pt was afebrile and hemodynamically stable for several days prior to d/c Infected right knee arthroplasty-MSSA -consult ID--continue cefazolin--patient is tolerating -Patient with a discharge with cefazolin 2 g IV every 8 hours through 02/20/2015 -concerned he may ultimately need explantation if infection recurs -continue rifampin  -Dr. Sharol Given following -home health was set up for wound care and IV abx and PICC line care Acute kidney injury -This may be multifactorial including the patient's recent use of ibuprofen, intravenous vancomycin, as well as his sepsis in the setting of ARB and HCTZ use -01/06/2015 random vancomycin level 29; however, random Vanco level on 01/08/2015--11 -baseline 0.7-1.0 -HCTZ will not be restarted -will not restart valsartan at this time -pt's BP remained stable and controlled off anti-HTN meds during the hospitalization -SBP remained in 110-120 off of meds -f/u PCP for BP checks Right leg pain and edema -Obtain duplex r/o DVT--neg DVT bilateral Hypertension -Hold ARB in the setting of renal failure and soft blood pressures -d/c HCTZ Hyperglycemia -Check hemoglobin  A1c--pending    Discharge Condition: stable  Disposition: home Follow-up Information    Follow up with DUDA,MARCUS V, MD In 2 weeks.   Specialty:  Orthopedic Surgery   Contact information:   [place] [address] [phone]       Follow up with [facility].   Why:  Home Health RN and Physical Therapy   Contact information:   [place] [address] [phone]       Diet:heart healthy Wt Readings from Last 3 Encounters:  01/12/15 146 kg (321 lb 14 oz)  01/03/15 146.512 kg (323 lb)  02/05/14 142.429 kg (314 lb)    History of present illness:  59 year old male with a history of hypertension, GERD presented to the emergency department secondary to being notified of abnormal labs, notably increased serum creatinine. The patient was recently discharged from the hospital on 01/06/2015 after revision and exchange of his polyethylene liner on his right knee prosthesis. The patient initially had a right total knee arthroplasty on 01/30/2014. He did well for a period of time. Approximately one week prior to his revision, around 12/30/2014, the patient noted increasing swelling, pain, and subsequent drainage from his right knee. He was placed on oral doxycycline in the outpatient setting and subsequently admitted to the hospital. Intraoperative cultures from his knee revision grew MSSA. The patient was discharged home on vancomycin as a result of his allergy to cephalexin. In addition, the patient took 2 days of rifampin. The patient presented with a septic presentation. He was admitted to the stepdown unit. Although the patient was initially started on clindamycin after vancomycin was stopped, he was ultimately changed to cefazolin which she tolerated. Rifampin was continued. The patient improved clinically. He was transitioned to the medical  surgical unit. The patient's renal function continued to improve. Serum creatinine was 2.60  on the day of admission. The patient's procalcitonin decreased from 28.9 to 0.22. Physical therapy was consulted. They recommended home health physical therapy which was set up with assistance and care management. The patient's home health services will be restarted for his intravenous antibiotics and wound care.    Consultants: [facility]--Duda  Discharge Exam: Filed Vitals:   01/12/15 0538  BP: 118/62  Pulse: 96  Temp: 99 F (37.2 C)  Resp: 18   Filed Vitals:   01/11/15 0605 01/11/15 1401 01/11/15 2142 01/12/15 0538  BP: 118/43 119/51 99/61 118/62  Pulse: 82 102 94 96  Temp: 98.5 F (36.9 C) 98.7 F (37.1 C) 97.8 F (36.6 C) 99 F (37.2 C)  TempSrc: Oral Oral Oral Oral  Resp: _0 Height:      Weight: 145.1 kg (319 lb 14.2 oz)   146 kg (321 lb 14 oz)  SpO2: 95% 100% 98% 96%   General: A&O x 3, NAD, pleasant, cooperative Cardiovascular: RRR, no rub, no gallop, no S3 Respiratory: CTAB, no wheeze, no rhonchi Abdomen:soft, nontender, nondistended, positive bowel sounds Extremities: 2+LE edema, No lymphangitis, no petechiae; right knee without any erythema, crepitance, necrosis. There is a scant amount of white drainage.  Discharge Instructions      Discharge Instructions    Change dressing    Complete by:  As directed   Wash right knee with soap and water daily and apply dry dressing daily.     Diet - low sodium heart healthy    Complete by:  As directed      Increase activity slowly    Complete by:  As directed      Weight bearing as tolerated    Complete by:  As directed             Medication List    STOP taking these medications        aspirin EC 325 MG tablet     hydrochlorothiazide 12.5 MG capsule  Commonly known as:  MICROZIDE     ibuprofen 800 MG tablet  Commonly known as:  ADVIL,MOTRIN     methocarbamol 500 MG tablet  Commonly known as:  ROBAXIN     valsartan 160 MG tablet  Commonly known as:  DIOVAN     vancomycin 1,000 mg in  sodium chloride 0.9 % 100 mL      TAKE these medications        albuterol 108 (90 BASE) MCG/ACT inhaler  Commonly known as:  PROVENTIL HFA;VENTOLIN HFA  Inhale 1-2 puffs into the lungs every 6 (six) hours as needed for wheezing or shortness of breath.     ceFAZolin 2-3 GM-% Solr  Commonly known as:  ANCEF  Inject 50 mLs (2 g total) into the vein every 8 (eight) hours. Stop after 02/20/15 doses     cholecalciferol 1000 UNITS tablet  Commonly known as:  VITAMIN D  Take 1,000 Units by mouth daily.     cyclobenzaprine 10 MG tablet  Commonly known as:  FLEXERIL  Take 10 mg by mouth 3 (three) times daily as needed for muscle spasms.     fluticasone 50 MCG/ACT nasal spray  Commonly known as:  FLONASE  Place 1 spray into both nostrils daily.     omeprazole 20 MG capsule  Commonly known as:  PRILOSEC  Take 20 mg by mouth daily.     oxyCODONE-acetaminophen 5-325  MG per tablet  Commonly known as:  ROXICET  Take 1 tablet by mouth every 4 (four) hours as needed for severe pain.     rifampin 300 MG capsule  Commonly known as:  RIFADIN  Take 1 capsule (300 mg total) by mouth 2 (two) times daily.     tamsulosin 0.4 MG Caps capsule  Commonly known as:  FLOMAX  Take 0.4 mg by mouth at bedtime.         The results of significant diagnostics from this hospitalization (including imaging, microbiology, ancillary and laboratory) are listed below for reference.    Significant Diagnostic Studies: US Abdomen Complete  01/09/2015   CLINICAL DATA:  Elevated liver function tests.  Acute renal failure.  EXAM: ULTRASOUND ABDOMEN COMPLETE  COMPARISON:  05/01/2004 CTA  FINDINGS: Gallbladder: No gallstones or wall thickening visualized. No sonographic Murphy sign noted.  Common bile duct: Diameter: Normal, 4 mm.  Liver: Increased echogenicity.  IVC: No abnormality visualized.  Pancreas: Poorly visualized due to overlying bowel gas.  Spleen: Mild splenomegaly, 14 cm craniocaudal.  Right Kidney: Length:  12.8 cm. Poorly visualized due to overlying bowel gas. Suspect mild pelvicaliectasis.  Left Kidney: Length: 12.7 cm. Poorly visualized due to overlying bowel gas.  Abdominal aorta: No gross aneurysm identified.  Other findings: No ascites.  Exam degraded by patient morbid obesity.  IMPRESSION: 1. Moderate degradation secondary to patient body habitus. 2. Hepatic steatosis. No other explanation for elevated liver function tests. 3. Suspect mild pelvicaliectasis. Consider further evaluation with abdominal pelvic CT.   Electronically Signed   By: Abigail Miyamoto M.D.   On: 01/09/2015 13:30   Dg Chest Port 1 View  01/09/2015   CLINICAL DATA:  Sepsis.  EXAM: PORTABLE CHEST - 1 VIEW  COMPARISON:  01/23/2014  FINDINGS: The heart is mildly enlarged. The left PICC line tip is in the distal SVC near the cavoatrial junction. There is streaky bibasilar atelectasis or infiltrates and possible left effusion. Mild central vascular congestion without overt pulmonary edema. The bony thorax is intact.  IMPRESSION: Streaky bibasilar airspace opacity, atelectasis versus infiltrates.  Left PICC line in the distal SVC.   Electronically Signed   By: Marijo Sanes M.D.   On: 01/09/2015 07:39     Microbiology: Recent Results (from the past 240 hour(s))  Anaerobic culture     Status: None   Collection Time: 01/03/15  3:54 PM  Result Value Ref Range Status   Specimen Description TISSUE RIGHT KNEE  Final   Special Requests SPEC A  Final   Gram Stain   Final    FEW WBC PRESENT,BOTH PMN AND MONONUCLEAR NO ORGANISMS SEEN Performed at [facility]    Culture   Final    NO ANAEROBES ISOLATED Performed at [facility]    Report Status 01/08/2015 FINAL  Final  Tissue culture     Status: None   Collection Time: 01/03/15  3:58 PM  Result Value Ref Range Status   Specimen Description TISSUE RIGHT KNEE  Final   Special Requests SPEC A  Final   Gram Stain   Final    NO WBC SEEN NO ORGANISMS SEEN Performed at  [facility]    Culture   Final    FEW STAPHYLOCOCCUS AUREUS Performed at [facility]    Report Status 01/06/2015 FINAL  Final   Organism ID, Bacteria STAPHYLOCOCCUS AUREUS  Final      Susceptibility   Staphylococcus aureus - MIC*    CLINDAMYCIN <=  0.25 SENSITIVE Sensitive     ERYTHROMYCIN 0.5 SENSITIVE Sensitive     GENTAMICIN <=0.5 SENSITIVE Sensitive     LEVOFLOXACIN <=0.12 SENSITIVE Sensitive     OXACILLIN <=0.25 SENSITIVE Sensitive     RIFAMPIN <=0.5 SENSITIVE Sensitive     TRIMETH/SULFA <=10 SENSITIVE Sensitive     VANCOMYCIN 1 SENSITIVE Sensitive     TETRACYCLINE <=1 SENSITIVE Sensitive     MOXIFLOXACIN <=0.25 SENSITIVE Sensitive     * FEW STAPHYLOCOCCUS AUREUS  Tissue culture     Status: None   Collection Time: 01/03/15  4:05 PM  Result Value Ref Range Status   Specimen Description TISSUE RIGHT KNEE  Final   Special Requests SPEC B  Final   Gram Stain   Final    NO WBC SEEN NO ORGANISMS SEEN Performed at [facility]    Culture   Final    NO GROWTH 3 DAYS Performed at [facility]    Report Status 01/07/2015 FINAL  Final  Urine culture     Status: None   Collection Time: 01/08/15  8:44 PM  Result Value Ref Range Status   Specimen Description URINE, CLEAN CATCH  Final   Special Requests NONE  Final   Culture NO GROWTH 2 DAYS  Final   Report Status 01/10/2015 FINAL  Final  Blood Culture (routine x 2)     Status: None (Preliminary result)   Collection Time: 01/08/15  8:57 PM  Result Value Ref Range Status   Specimen Description BLOOD RIGHT HAND  Final   Special Requests BOTTLES DRAWN AEROBIC AND ANAEROBIC 3CC  Final   Culture NO GROWTH 3 DAYS  Final   Report Status PENDING  Incomplete  Blood Culture (routine x 2)     Status: None (Preliminary result)   Collection Time: 01/08/15  9:40 PM  Result Value Ref Range Status   Specimen Description BLOOD RIGHT ARM  Final   Special Requests BOTTLES DRAWN AEROBIC ONLY 5CC  Final    Culture NO GROWTH 3 DAYS  Final   Report Status PENDING  Incomplete  MRSA PCR Screening     Status: None   Collection Time: 01/09/15  2:18 AM  Result Value Ref Range Status   MRSA by PCR NEGATIVE NEGATIVE Final    Comment:        The GeneXpert MRSA Assay (FDA approved for NASAL specimens only), is one component of a comprehensive MRSA colonization surveillance program. It is not intended to diagnose MRSA infection nor to guide or monitor treatment for MRSA infections.      Labs: Basic Metabolic Panel:  Recent Labs Lab 01/08/15 1841 01/09/15 0510 01/10/15 0445 01/11/15 0500 01/12/15 0535  NA 136 133* 137  137 135 134*  K 4.2 4.3 4.4  4.4 4.2 4.0  CL 101 104 107  107 103 101  CO2 _0 GLUCOSE 111* 130* 122*  123* 120* 130*  BUN 61* 53* 31*  31* 21* 17  CREATININE 2.60* 2.18* 1.71*  1.69* 1.53* 1.36*  CALCIUM 10.3 8.6* 8.6*  8.7* 8.8* 8.8*  MG  --   --  1.5*  --   --   PHOS  --   --  4.1  --   --    Liver Function Tests:  Recent Labs Lab 01/08/15 1841 01/09/15 0510 01/09/15 1400 01/10/15 0445  AST 44* 34 36  --   ALT _1 --   ALKPHOS 103 89  90  --   BILITOT 1.4* 1.1 1.2  --   PROT 8.4* 6.3* 8.0  --   ALBUMIN 2.7* 2.2* 2.5* 2.3*   No results for input(s): LIPASE, AMYLASE in the last 168 hours. No results for input(s): AMMONIA in the last 168 hours. CBC:  Recent Labs Lab 01/08/15 1841 01/08/15 2148 01/09/15 0510 01/10/15 0445  WBC 6.8  --  5.5 4.5  NEUTROABS  --  5.0 3.7 2.9  HGB 11.4*  --  9.9* 10.2*  HCT 33.4*  --  29.1* 30.9*  MCV 83.7  --  84.8 85.1  PLT 219  --  187 207   Cardiac Enzymes:  Recent Labs Lab 01/09/15 0900 01/09/15 1400 01/09/15 2120  TROPONINI <0.03 <0.03 <0.03   BNP: Invalid input(s): POCBNP CBG:  Recent Labs Lab 01/08/15 2232  GLUCAP 97    Time coordinating discharge:  Greater than 30 minutes  Signed:  Jupiter Kabir, DO [facility] Pager: [phone] 01/12/2015, 8:12  AM

## 2015-01-11 NOTE — Care Management Note (Signed)
Case Management Note  Patient Details  Name: Eddie Fletcher MRN: [id number] Date of Birth: 1955/12/02  Subjective/Objective:         Sepsis -Secondary to infected right knee arthroplasty           Action/Plan: Home Health active with [facility]. Notified [facility] rep of scheduled dc home 9/4 with IV abx, Ancef, a change from previous abx at home. Faxed Rx to [facility] pharmacy. Will continue to follow up for dc needs. [facility] will need info on when [facility] RN should come out to home tomorrow to start of care for new abx.   Expected Discharge Date:  01/12/2015               Expected Discharge Plan:  [place]  In-House Referral:     Discharge planning Services  [facility] [facility], CM Consult  Post Acute Care Choice:  Home Health, Resumption of Svcs/PTA Provider Choice offered to:  Patient    HH Arranged:  RN, IV Antibiotics, PT HH Agency:  [facility]  Status of Service:  Completed, signed off  Medicare Important Message Given:    Date Medicare IM Given:    Medicare IM give by:    Date Additional Medicare IM Given:    Additional Medicare Important Message give by:     If discussed at [place] of Stay Meetings, dates discussed:    Additional Comments: See previous NCM notes.  Erenest Rasher, RN 01/11/2015, 10:58 AM

## 2015-01-11 NOTE — Progress Notes (Signed)
Physical Therapy Treatment Patient Details Name: Eddie Fletcher MRN: [id number] DOB: May 23, 1955 Today's Date: 01/11/2015    History of Present Illness Pt is a 59 y/o M admitted with AKI. 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  Pt's PMH includes Bsnier-Boeck disease, HTN, edema, obesity, cellulitis, Bil carpal tunnel release, Rt thumb trigger finger.    PT Comments    Patient just returned to bed post ambulating with nursing to bathroom and too fatigued to ambulate.  Did perform total knee exercises in sitting and standing, focusing on knee extension.  Patient with good effort with exercises.  Will attempt PT early tomorrow am before planned discharge to ensure safety.  Patient will need continued PT to continue to progress ambulation and ROM for full functional gain.   Follow Up Recommendations  Home health PT;Supervision for mobility/OOB     Equipment Recommendations  None recommended by PT    Recommendations for Other Services       Precautions / Restrictions Precautions Precautions: Knee;Fall Precaution Comments: Reviewed knee precautions Restrictions RLE Weight Bearing: Weight bearing as tolerated    Mobility  Bed Mobility Overal bed mobility: Needs Assistance Bed Mobility: Sit to Supine       Sit to supine: Min assist   General bed mobility comments: assist to lift RLE onto bed  Transfers                    Ambulation/Gait             General Gait Details: patient deferred gait today - had just ambulated with nursing and too fatigued.   Stairs            Wheelchair Mobility    Modified Rankin (Stroke Patients Only)       Balance                                    Cognition Arousal/Alertness: Awake/alert Behavior During Therapy: WFL for tasks assessed/performed Overall Cognitive Status: Within Functional Limits for tasks assessed                      Exercises  Total Joint Exercises Ankle Circles/Pumps: AROM;Both;10 reps;Seated Quad Sets: 10 reps;Strengthening;Right;Supine Towel Squeeze: AROM;Right;10 reps;Supine Short Arc Quad: AROM;Right;10 reps;Supine Hip ABduction/ADduction: AROM;Right;10 reps;Supine Straight Leg Raises: AAROM;Right;10 reps;Supine Long Arc Quad: AROM;Right;10 reps;Seated;Limitations Long [employer] Limitations: unable to reach full extension Knee Flexion: AAROM;Right;10 reps;Supine    General Comments        Pertinent Vitals/Pain Pain Assessment: 0-10 Pain Score: 3  Pain Location: R knee Pain Descriptors / Indicators: Sore Pain Intervention(s): Limited activity within patient's tolerance;Monitored during session    Home Living                      Prior Function            PT Goals (current goals can now be found in the care plan section) Progress towards PT goals: Progressing toward goals    Frequency  7X/week    PT Plan Current plan remains appropriate    Co-evaluation             End of Session   Activity Tolerance: Patient tolerated treatment well Patient left: in bed;with call bell/phone within reach     Time: 1359-1421 PT Time Calculation (min) (ACUTE ONLY):  22 min  Charges:  $Therapeutic Exercise: 8-22 mins                    G Codes:      Shanna Cisco 01-16-15, 2:27 PM  01/16/15 [place], [place]

## 2015-01-12 LAB — BASIC METABOLIC PANEL
ANION GAP: 5 (ref 5–15)
BUN: 17 mg/dL (ref 6–20)
CHLORIDE: 101 mmol/L (ref 101–111)
CO2: 28 mmol/L (ref 22–32)
Calcium: 8.8 mg/dL — ABNORMAL LOW (ref 8.9–10.3)
Creatinine, Ser: 1.36 mg/dL — ABNORMAL HIGH (ref 0.61–1.24)
GFR calc non Af Amer: 56 mL/min — ABNORMAL LOW (ref >60)
Glucose, Bld: 130 mg/dL — ABNORMAL HIGH (ref 65–99)
POTASSIUM: 4 mmol/L (ref 3.5–5.1)
Sodium: 134 mmol/L — ABNORMAL LOW (ref 135–145)

## 2015-01-12 MED ORDER — CEFAZOLIN SODIUM-DEXTROSE 2-3 GM-% IV SOLR
2.0000 g | Freq: Three times a day (TID) | INTRAVENOUS | Status: DC
  Filled 2015-01-12 (×2): qty 50

## 2015-01-12 MED ORDER — CEFAZOLIN SODIUM-DEXTROSE 2-3 GM-% IV SOLR
2.0000 g | Freq: Three times a day (TID) | INTRAVENOUS | Status: DC
Start: 2015-01-12 — End: 2015-02-17

## 2015-01-12 MED ORDER — HEPARIN SOD (PORK) LOCK FLUSH 100 UNIT/ML IV SOLN
250.0000 [IU] | INTRAVENOUS | Status: AC | PRN
  Administered 2015-01-12: 250 [IU]

## 2015-01-12 NOTE — Progress Notes (Signed)
Patient discharge teaching given, including activity, diet, follow-up appoints, and medications. Patient verbalized understanding of all discharge instructions. PICC line remains in place to left upper arm. Teaching provided regarding care. Home health to arrival shortly after d/c. Vitals are stable. Skin is intact except as charted in most recent assessments. Pt to be escorted out by NT, to be driven home by family.

## 2015-01-13 LAB — CULTURE, BLOOD (ROUTINE X 2)
Culture: NO GROWTH
Culture: NO GROWTH

## 2015-01-14 LAB — HEMOGLOBIN A1C
Hgb A1c MFr Bld: 5.6 % (ref 4.8–5.6)
Mean Plasma Glucose: 114 mg/dL

## 2015-02-17 ENCOUNTER — Ambulatory Visit (INDEPENDENT_AMBULATORY_CARE_PROVIDER_SITE_OTHER): Payer: Managed Care, Other (non HMO) | Admitting: Infectious Diseases

## 2015-02-17 ENCOUNTER — Encounter: Payer: Self-pay | Admitting: Infectious Diseases

## 2015-02-17 VITALS — BP 141/82 | HR 120 | Temp 97.3°F | Wt 321.0 lb

## 2015-02-17 DIAGNOSIS — N179 Acute kidney failure, unspecified: Secondary | ICD-10-CM

## 2015-02-17 DIAGNOSIS — M00061 Staphylococcal arthritis, right knee: Secondary | ICD-10-CM | POA: Diagnosis not present

## 2015-02-17 MED ORDER — LEVOFLOXACIN 500 MG PO TABS: 500.0000 mg | ORAL_TABLET | Freq: Every day | ORAL | Status: DC

## 2015-02-17 NOTE — Progress Notes (Signed)
   Subjective:    Patient ID: Eddie Fletcher, male    DOB: [birth year], 59 y.o.   MRN: [id number]  HPI 59 yo M with hx of R TKR 01-30-14 who did well until developing swelling and pain ~ 12-30-14. He then developed a draining os. He was placed on po doxy.  He was adm to hospital on 8-26 and underwent I & D, poly-exchange, anbx beads. His operative Cx grew MSSA, he was d/c to SNF on 8-29 with IV vancomycin, rifampin.  He returns to hospital on 8-31 with an elevated Cr (0.98 to 1.67 --> 2.6). There was concern he was septic on adm (WBC normal). Has also been on NSAIDs, ARB, HCTZ.  Random vancomycin level was 29 on adm.  He was afebrile on adm but reported a temp of 101 on 8-29. His BCx were (-).  He was d/c home on 9-4 on ancef/rifampin (Cr 1.39). His chart listed allergy to Keflex but he tolerated Ancef without issue.  He saw othro this AM and was felt to be doing well. He will be completing IV anbx on 10-14.  No f/c. No rashes. No problems with PIC.  TKR wound has healed, no d/c.   Review of Systems  Constitutional: Negative for fever, chills, appetite change and unexpected weight change.  Gastrointestinal: Negative for diarrhea and constipation.  Genitourinary: Negative for difficulty urinating.  Skin: Negative for rash.  has loose once BM daily.  being seen at bariatric center, high protein, low carb. Has lost 12#.   Today is day 45 of anbx.      Objective:   Physical Exam  Constitutional: He appears well-developed and well-nourished.  Non-toxic appearance. He does not have a sickly appearance.  HENT:  Mouth/Throat: No oropharyngeal exudate.  Eyes: EOM are normal. Pupils are equal, round, and reactive to light.  Neck: Neck supple.  Cardiovascular: Normal rate, regular rhythm and normal heart sounds.   Pulmonary/Chest: Effort normal and breath sounds normal.  Abdominal: Soft. Bowel sounds are normal. There is no tenderness.  Musculoskeletal:       Arms:       Legs: Lymphadenopathy:    He has no cervical adenopathy.      Assessment & Plan:

## 2015-02-17 NOTE — Assessment & Plan Note (Signed)
He appears to be doing well. Will stop his IV anbx today and change him to PO.  Will recheck his LFTs and BMP.  Will check his ESR and CRP.  He has f/u with ortho next month.  rtc ID in 3 months.

## 2015-02-17 NOTE — Assessment & Plan Note (Signed)
He takes a prn NSAID- I advised him to avoid this. His Cr on 10-4 was 0.76

## 2015-02-19 ENCOUNTER — Telehealth: Payer: Self-pay | Admitting: *Deleted

## 2015-02-19 NOTE — Telephone Encounter (Signed)
Per Dr Johnnye Sima called [facility] and asked them to do a repeat potassium as level from 02/17/15 was 6.2. [facility] advised they D/C the PICC and do not follow the patient as of 02/17/15. Called the patient and let him know of the elevated potassium and he advised he has been drinking lots to gatorade and he has an appt to see him doctor early next week. Advised him Dr Johnnye Sima wants to have the levels stat and he advised he will call his PCP as he can not get here today or tomorrow. Advised him will let the doctor know.

## 2015-02-21 ENCOUNTER — Inpatient Hospital Stay: Payer: Managed Care, Other (non HMO) | Admitting: Infectious Diseases

## 2015-02-21 NOTE — Telephone Encounter (Signed)
Pt called with complaints of cough, muscle aches, since Tuesday PM (started levaquin on tuesday).  Advised him to stop levaquin, stop rifampin.  Will give him drug holiday over weekend, have him start doxy on Monday if better.  He will call back on Monday.

## 2015-02-24 ENCOUNTER — Telehealth: Payer: Self-pay | Admitting: Infectious Diseases

## 2015-02-24 NOTE — Telephone Encounter (Signed)
Called pt twice, no answer.  Left VM for pt to call clinic back

## 2015-02-27 ENCOUNTER — Telehealth: Payer: Self-pay | Admitting: Infectious Diseases

## 2015-02-27 NOTE — Telephone Encounter (Signed)
Returned pt's call, asked him to call back at [phone]

## 2015-02-28 ENCOUNTER — Other Ambulatory Visit: Payer: Self-pay | Admitting: *Deleted

## 2015-02-28 ENCOUNTER — Telehealth: Payer: Self-pay | Admitting: *Deleted

## 2015-02-28 DIAGNOSIS — M00061 Staphylococcal arthritis, right knee: Secondary | ICD-10-CM

## 2015-02-28 MED ORDER — DOXYCYCLINE HYCLATE 100 MG PO TABS: 100.0000 mg | ORAL_TABLET | Freq: Two times a day (BID) | ORAL | Status: DC

## 2015-02-28 NOTE — Telephone Encounter (Signed)
Per Dr Johnnye Sima called the patient who states he is feeling better since stopping the 2 antibiotics and headaches are gone. Advised the patient Dr Johnnye Sima wants him to start on Doxy 100 bid for 30 days with 2 refills. Also advised needs follow up appt in about 6 weeks. Confirmed pharmacy and will send Rx and mail appt to patient.

## 2015-03-04 ENCOUNTER — Telehealth: Payer: Self-pay | Admitting: *Deleted

## 2015-03-04 NOTE — Telephone Encounter (Signed)
Requested pt call back, leave detailed message.

## 2015-04-09 ENCOUNTER — Ambulatory Visit (INDEPENDENT_AMBULATORY_CARE_PROVIDER_SITE_OTHER): Payer: Managed Care, Other (non HMO) | Admitting: Infectious Diseases

## 2015-04-09 ENCOUNTER — Encounter: Payer: Self-pay | Admitting: Infectious Diseases

## 2015-04-09 VITALS — BP 144/84 | HR 91 | Temp 97.6°F | Ht 68.0 in | Wt 320.0 lb

## 2015-04-09 DIAGNOSIS — I10 Essential (primary) hypertension: Secondary | ICD-10-CM

## 2015-04-09 DIAGNOSIS — M00061 Staphylococcal arthritis, right knee: Secondary | ICD-10-CM

## 2015-04-09 DIAGNOSIS — N179 Acute kidney failure, unspecified: Secondary | ICD-10-CM | POA: Diagnosis not present

## 2015-04-09 MED ORDER — DOXYCYCLINE HYCLATE 100 MG PO TABS: 100.0000 mg | ORAL_TABLET | Freq: Two times a day (BID) | ORAL | Status: DC

## 2015-04-09 NOTE — Assessment & Plan Note (Signed)
Last labs showed this had improved (but not normalized) Has PCP f/u.

## 2015-04-09 NOTE — Progress Notes (Signed)
   Subjective:    Patient ID: Eddie Fletcher, male    DOB: 1956/03/27, 59 y.o.   MRN: [id number]  HPI 59 yo M with hx of R TKR 01-30-14 who did well until developing swelling and pain ~ 12-30-14. He then developed a draining os. He was placed on po doxy.  He was adm to hospital on 8-26 and underwent I & D, poly-exchange, anbx beads. His operative Cx grew MSSA, he was d/c to SNF on 8-29 with IV vancomycin, rifampin.  He returns to hospital on 8-31 with an elevated Cr (0.98 to 1.67 --> 2.6). There was concern he was septic on adm (WBC normal). Has also been on NSAIDs, ARB, HCTZ.  Random vancomycin level was 29 on adm.  He was afebrile on adm but reported a temp of 101 on 8-29. His BCx were (-).  He was d/c home on 9-4 on ancef/rifampin (Cr 1.39). His chart listed allergy to Keflex but he tolerated Ancef without issue.  He was seen in ID on 10-10 and was transitioned to doxy.  Feels well,his knee has healed well. Has a small scab at the inferior portion of the wound. No d/c. His wt bearing on his R knee is "fine".  Has been having worsening back pain, keeping him from work.  Is trying to diet and lose wt.   Review of Systems  Constitutional: Negative for fever, chills and unexpected weight change.  Cardiovascular: Positive for leg swelling.  Gastrointestinal: Negative for diarrhea and constipation.  Genitourinary: Negative for difficulty urinating.  Musculoskeletal: Positive for back pain and arthralgias.  eating yogurt to try to help with his bowels. Takes a protein shake daily.       Objective:   Physical Exam  Constitutional: He appears well-developed and well-nourished.  Musculoskeletal:       Legs:         Assessment & Plan:

## 2015-04-09 NOTE — Assessment & Plan Note (Signed)
Appears to be fairly well controlled today.  My only concern is his LE edema which may contribute to his skin breakdown.  Has PCP f/u.

## 2015-04-09 NOTE — Assessment & Plan Note (Signed)
He is doing well.  He will remain on doxy for a total of 6 months (stop April 2017).  He will f/u prn.  I advised him to use mostuirizers on his LE to help with skin breakdown. He will f/u with his PCP re: this.

## 2015-06-23 ENCOUNTER — Ambulatory Visit: Payer: Managed Care, Other (non HMO) | Admitting: Infectious Diseases

## 2016-06-24 ENCOUNTER — Telehealth (INDEPENDENT_AMBULATORY_CARE_PROVIDER_SITE_OTHER): Payer: Self-pay | Admitting: Radiology

## 2016-06-24 NOTE — Telephone Encounter (Signed)
Phone encounter not needed

## 2018-12-24 ENCOUNTER — Encounter (HOSPITAL_COMMUNITY): Payer: Self-pay | Admitting: Emergency Medicine

## 2018-12-24 ENCOUNTER — Other Ambulatory Visit: Payer: Self-pay

## 2018-12-24 ENCOUNTER — Emergency Department (HOSPITAL_COMMUNITY)
Admission: EM | Admit: 2018-12-24 | Discharge: 2018-12-24 | Disposition: A | Discharge status: Home / Self Care | Payer: Medicare HMO | Attending: Emergency Medicine | Admitting: Emergency Medicine

## 2018-12-24 DIAGNOSIS — Y92810 Car as the place of occurrence of the external cause: Secondary | ICD-10-CM | POA: Insufficient documentation

## 2018-12-24 DIAGNOSIS — Y9389 Activity, other specified: Secondary | ICD-10-CM | POA: Insufficient documentation

## 2018-12-24 DIAGNOSIS — Z79899 Other long term (current) drug therapy: Secondary | ICD-10-CM | POA: Insufficient documentation

## 2018-12-24 DIAGNOSIS — W228XXA Striking against or struck by other objects, initial encounter: Secondary | ICD-10-CM | POA: Diagnosis not present

## 2018-12-24 DIAGNOSIS — T148XXA Other injury of unspecified body region, initial encounter: Secondary | ICD-10-CM

## 2018-12-24 DIAGNOSIS — Y999 Unspecified external cause status: Secondary | ICD-10-CM | POA: Diagnosis not present

## 2018-12-24 DIAGNOSIS — S8992XA Unspecified injury of left lower leg, initial encounter: Secondary | ICD-10-CM | POA: Diagnosis present

## 2018-12-24 DIAGNOSIS — S81802A Unspecified open wound, left lower leg, initial encounter: Secondary | ICD-10-CM | POA: Insufficient documentation

## 2018-12-24 DIAGNOSIS — Z96651 Presence of right artificial knee joint: Secondary | ICD-10-CM | POA: Diagnosis not present

## 2018-12-24 DIAGNOSIS — Z23 Encounter for immunization: Secondary | ICD-10-CM | POA: Diagnosis not present

## 2018-12-24 DIAGNOSIS — I10 Essential (primary) hypertension: Secondary | ICD-10-CM | POA: Insufficient documentation

## 2018-12-24 MED ORDER — BACITRACIN ZINC 500 UNIT/GM EX OINT
TOPICAL_OINTMENT | Freq: Once | CUTANEOUS | Status: AC
  Administered 2018-12-24: 19:00:00 via TOPICAL

## 2018-12-24 MED ORDER — TETANUS-DIPHTH-ACELL PERTUSSIS 5-2.5-18.5 LF-MCG/0.5 IM SUSP
0.5000 mL | Freq: Once | INTRAMUSCULAR | Status: AC
  Administered 2018-12-24: 0.5 mL via INTRAMUSCULAR
  Filled 2018-12-24: qty 0.5

## 2018-12-24 NOTE — ED Notes (Signed)
Updated pt on wait from treatment room.  He is going back to [address] to see wife that is a pt.
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
## 2018-12-24 NOTE — ED Notes (Signed)
Patient verbalizes understanding of discharge instructions. Opportunity for questioning and answers were provided. Armband removed by staff, pt discharged from ED.  
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
## 2018-12-24 NOTE — Discharge Instructions (Signed)
Keep the wound clean and dry for the first 24 hours. After that you may gently clean the wound with soap and water. Make sure to pat dry the wound before covering it with any dressing. You can use topical antibiotic ointment and bandage. Ice and elevate for pain relief.   You can take Tylenol or Ibuprofen as directed for pain. You can alternate Tylenol and Ibuprofen every 4 hours for additional pain relief.   Monitor closely for any signs of infection. Return to the Emergency Department for any worsening redness/swelling of the area that begins to spread, drainage from the site, worsening pain, fever or any other worsening or concerning symptoms.    

## 2020-12-12 ENCOUNTER — Emergency Department (HOSPITAL_COMMUNITY): Payer: Medicare HMO

## 2020-12-12 ENCOUNTER — Emergency Department (HOSPITAL_COMMUNITY)
Admission: EM | Admit: 2020-12-12 | Discharge: 2020-12-15 | Disposition: A | Discharge status: Home / Self Care | Payer: Medicare HMO | Attending: Emergency Medicine | Admitting: Emergency Medicine

## 2020-12-12 DIAGNOSIS — M5442 Lumbago with sciatica, left side: Secondary | ICD-10-CM

## 2020-12-12 DIAGNOSIS — Z79899 Other long term (current) drug therapy: Secondary | ICD-10-CM | POA: Diagnosis not present

## 2020-12-12 DIAGNOSIS — Z20822 Contact with and (suspected) exposure to covid-19: Secondary | ICD-10-CM | POA: Insufficient documentation

## 2020-12-12 DIAGNOSIS — Z91040 Latex allergy status: Secondary | ICD-10-CM | POA: Diagnosis not present

## 2020-12-12 DIAGNOSIS — M5416 Radiculopathy, lumbar region: Secondary | ICD-10-CM | POA: Insufficient documentation

## 2020-12-12 DIAGNOSIS — I129 Hypertensive chronic kidney disease with stage 1 through stage 4 chronic kidney disease, or unspecified chronic kidney disease: Secondary | ICD-10-CM | POA: Diagnosis not present

## 2020-12-12 DIAGNOSIS — M545 Low back pain, unspecified: Secondary | ICD-10-CM

## 2020-12-12 DIAGNOSIS — N179 Acute kidney failure, unspecified: Secondary | ICD-10-CM | POA: Diagnosis not present

## 2020-12-12 DIAGNOSIS — N189 Chronic kidney disease, unspecified: Secondary | ICD-10-CM | POA: Insufficient documentation

## 2020-12-12 DIAGNOSIS — G8929 Other chronic pain: Secondary | ICD-10-CM

## 2020-12-12 DIAGNOSIS — Z96651 Presence of right artificial knee joint: Secondary | ICD-10-CM | POA: Insufficient documentation

## 2020-12-12 LAB — CBC WITH DIFFERENTIAL/PLATELET
Abs Immature Granulocytes: 0.24 10*3/uL — ABNORMAL HIGH (ref 0.00–0.07)
Basophils Absolute: 0.1 10*3/uL (ref 0.0–0.1)
Basophils Relative: 0 %
Eosinophils Absolute: 0.1 10*3/uL (ref 0.0–0.5)
Eosinophils Relative: 1 %
HCT: 36.7 % — ABNORMAL LOW (ref 39.0–52.0)
Hemoglobin: 11.5 g/dL — ABNORMAL LOW (ref 13.0–17.0)
Immature Granulocytes: 2 %
Lymphocytes Relative: 5 %
Lymphs Abs: 0.7 10*3/uL (ref 0.7–4.0)
MCH: 29.4 pg (ref 26.0–34.0)
MCHC: 31.3 g/dL (ref 30.0–36.0)
MCV: 93.9 fL (ref 80.0–100.0)
Monocytes Absolute: 1.7 10*3/uL — ABNORMAL HIGH (ref 0.1–1.0)
Monocytes Relative: 12 %
Neutro Abs: 11.9 10*3/uL — ABNORMAL HIGH (ref 1.7–7.7)
Neutrophils Relative %: 80 %
Platelets: 138 10*3/uL — ABNORMAL LOW (ref 150–400)
RBC: 3.91 MIL/uL — ABNORMAL LOW (ref 4.22–5.81)
RDW: 15.9 % — ABNORMAL HIGH (ref 11.5–15.5)
WBC: 14.7 10*3/uL — ABNORMAL HIGH (ref 4.0–10.5)
nRBC: 0 % (ref 0.0–0.2)

## 2020-12-12 LAB — BASIC METABOLIC PANEL WITH GFR
Anion gap: 5 (ref 5–15)
BUN: 44 mg/dL — ABNORMAL HIGH (ref 8–23)
CO2: 24 mmol/L (ref 22–32)
Calcium: 7.6 mg/dL — ABNORMAL LOW (ref 8.9–10.3)
Chloride: 106 mmol/L (ref 98–111)
Creatinine, Ser: 3.05 mg/dL — ABNORMAL HIGH (ref 0.61–1.24)
GFR, Estimated: 22 mL/min — ABNORMAL LOW
Glucose, Bld: 126 mg/dL — ABNORMAL HIGH (ref 70–99)
Potassium: 3.9 mmol/L (ref 3.5–5.1)
Sodium: 135 mmol/L (ref 135–145)

## 2020-12-12 LAB — RESP PANEL BY RT-PCR (FLU A&B, COVID) ARPGX2
Influenza A by PCR: NEGATIVE
Influenza B by PCR: NEGATIVE
SARS Coronavirus 2 by RT PCR: NEGATIVE

## 2020-12-12 MED ORDER — HYDROMORPHONE HCL 1 MG/ML IJ SOLN
1.0000 mg | Freq: Once | INTRAMUSCULAR | Status: DC
Start: 2020-12-12 — End: 2020-12-12

## 2020-12-12 MED ORDER — ACETAMINOPHEN 325 MG PO TABS
650.0000 mg | ORAL_TABLET | ORAL | Status: DC | PRN
  Administered 2020-12-13 – 2020-12-14 (×3): 650 mg via ORAL
  Filled 2020-12-12 (×3): qty 2

## 2020-12-12 MED ORDER — ONDANSETRON HCL 4 MG/2ML IJ SOLN: 4.0000 mg | Freq: Once | INTRAMUSCULAR | Status: DC

## 2020-12-12 MED ORDER — ONDANSETRON 4 MG PO TBDP
4.0000 mg | ORAL_TABLET | Freq: Once | ORAL | Status: AC
  Administered 2020-12-12: 4 mg via ORAL
  Filled 2020-12-12: qty 1

## 2020-12-12 MED ORDER — HYDROMORPHONE HCL 1 MG/ML IJ SOLN
1.0000 mg | Freq: Once | INTRAMUSCULAR | Status: AC
Start: 2020-12-12 — End: 2020-12-12
  Administered 2020-12-12: 1 mg via INTRAMUSCULAR
  Filled 2020-12-12: qty 1

## 2020-12-12 MED ORDER — ONDANSETRON HCL 4 MG PO TABS
4.0000 mg | ORAL_TABLET | Freq: Three times a day (TID) | ORAL | Status: DC | PRN
  Administered 2020-12-13: 4 mg via ORAL
  Filled 2020-12-12: qty 1

## 2020-12-12 NOTE — ED Provider Notes (Addendum)
[facility] EMERGENCY DEPARTMENT Provider Note   CSN: [id number] Arrival date & time: 12/12/20  1058     History Chief Complaint  Patient presents with   Back Pain    Eddie Fletcher is a 65 y.o. male with history significant for chronic arthritis, GERD, hypertension, morbid obesity, history of renal insufficiency and chronic low back pain with a known lumbar disc herniation presenting for evaluation of not only continuation of his low back pain but also weakness in his left leg.  He sustained a fall trying to walk up his ramp at his home 3 days ago.  He describes falling and landing directly on his left knee and since that time has had increasing pain in his low back and now numbness and weakness in the left leg and foot, stating he feels like he is having to drag the left foot to ambulate.  He was seen at [facility] the date of the fall and CT imaging of his lumbar and pelvis was revealing for a pubic rami fracture but otherwise stable with no evidence for cauda equina.  Patient generally has limited walking ability, using a cane in his home and to walk to the bottom of his ramp into his car, any further walking requires the use of a wheelchair.  This morning he was trying to walk up his ramp when his left leg gave out on him and he was unable to get up.  He denies any new urinary or fecal incontinence or retention, although states he does have some chronic urinary incontinence since having some chronic prostate issues.  He denies fevers or chills.  He is taking Percocet, Flexeril, is completing a prednisone taper and also takes gabapentin chronically for his low back pain issues.  The history is provided by the patient.      Past Medical History:  Diagnosis Date   Arthritis    Cardiomegaly    Cellulitis    Chest discomfort    15 years ago; had negative testing and told it was related to stress   Colon polyps    Complication of anesthesia    blood pressure - low post op    Diaphoresis    Edema    Leg   GERD (gastroesophageal reflux disease)    Hemorrhoids    History of kidney stones    Hypertension    Obesity    Seasonal allergies    Sleep apnea    cpap 3 yrs-not used for 3-4 months   SOB (shortness of breath)    occ with exersion   Wears glasses     Patient Active Problem List   Diagnosis Date Noted   Chronic low back pain with left-sided sciatica 12/12/2020   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   Sepsis due to Staphylococcus aureus ([place]) 01/09/2015   Elevated LFTs    Acute renal failure ([place])    AKI (acute kidney injury) ([place]) 01/08/2015   Hypotension 01/08/2015   Septic joint of right knee joint ([place]) 01/08/2015   Septic joint of left knee joint ([place]) 01/03/2015   Besnier-Boeck disease 02/05/2014   Apnea, sleep 02/05/2014   Avitaminosis D 02/05/2014   BP (high blood pressure) 02/05/2014   Colon polyp 02/05/2014   Osteoarthritis of right knee 02/05/2014   Total knee replacement status 01/30/2014    Past Surgical History:  Procedure Laterality Date   APPLICATION OF WOUND VAC Right 01/03/2015   Procedure: APPLICATION OF INCISIONAL  WOUND VAC;  Surgeon: Beverely Low  Fernanda Drum, MD;  Location: [place];  Service: Orthopedics;  Laterality: Right;   CARPAL TUNNEL RELEASE Bilateral 04   COLONOSCOPY W/ BIOPSIES AND POLYPECTOMY     KNEE ARTHROSCOPY Right 12   TOTAL KNEE ARTHROPLASTY Right 01/30/2014   Procedure: Right Total Knee Arthroplasty;  Surgeon: Newt Minion, MD;  Location: [place];  Service: Orthopedics;  Laterality: Right;   TOTAL KNEE REVISION Right 01/03/2015   Procedure: Revision Right Total Knee Arthroplasty, Poly Exchange, Place Antibiotic Beads;  Surgeon: Newt Minion, MD;  Location: [place];  Service: Orthopedics;  Laterality: Right;   TRIGGER FINGER RELEASE Right 12   rt thumb       Family History  Problem Relation Age of Onset   Peripheral vascular disease Father    Alcoholism Mother    Hypertension Maternal Grandfather     Leukemia Maternal Aunt    Breast cancer Maternal Aunt     Social History   Tobacco Use   Smoking status: Never   Smokeless tobacco: Never  Substance Use Topics   Alcohol use: No    Comment: quit 15 yrs   Drug use: No    Home Medications Prior to Admission medications   Medication Sig Start Date End Date Taking? Authorizing Provider  acetaminophen (TYLENOL) 500 MG tablet Take 1,000 mg by mouth every 6 (six) hours as needed for moderate pain.   Yes [provider]  albuterol (PROVENTIL HFA;VENTOLIN HFA) 108 (90 BASE) MCG/ACT inhaler Inhale 1-2 puffs into the lungs every 6 (six) hours as needed for wheezing or shortness of breath.   Yes [provider]  cholecalciferol (VITAMIN D) 1000 UNITS tablet Take 1,000 Units by mouth daily.   Yes [provider]  clotrimazole (LOTRIMIN) 1 % cream Apply topically. 02/27/20 02/26/21 Yes [provider]  doxycycline (VIBRA-TABS) 100 MG tablet Take 100 mg by mouth 2 (two) times daily. 10/15/20 10/15/21 Yes [provider]  ferrous gluconate (FERGON) 324 MG tablet Take by mouth. 11/12/19  Yes [provider]  fluticasone (FLONASE) 50 MCG/ACT nasal spray Place 1 spray into both nostrils daily.   Yes [provider]  folic acid (FOLVITE) 1 MG tablet Take by mouth. 11/21/17  Yes [provider]  gabapentin (NEURONTIN) 100 MG capsule Take 100 mg by mouth 2 (two) times daily. 12/06/20  Yes [provider]  HYDROcodone-acetaminophen (NORCO) 10-325 MG tablet Take by mouth. 12/04/20  Yes [provider]  tamsulosin (FLOMAX) 0.4 MG CAPS capsule Take 0.4 mg by mouth at bedtime.   Yes [provider]  Cholecalciferol 50 MCG (2000 UT) TABS Take by mouth. Patient not taking: No sig reported 01/25/18   [provider]  cyclobenzaprine (FLEXERIL) 10 MG tablet Take 10 mg by mouth 3 (three) times daily as needed for muscle spasms. Patient not taking: Reported on 12/12/2020     [provider]  doxycycline (VIBRA-TABS) 100 MG tablet Take 1 tablet (100 mg total) by mouth 2 (two) times daily. Patient not taking: No sig reported 04/09/15   Campbell Riches, MD  fluticasone [facility]) 50 MCG/ACT nasal spray Place into the nose. Patient not taking: No sig reported 11/14/17 11/07/21  [provider]  ibuprofen (ADVIL) 400 MG tablet Take by mouth. Patient not taking: No sig reported 12/10/20   [provider]  methylPREDNISolone (MEDROL DOSEPAK) 4 MG TBPK tablet See admin instructions. Patient not taking: No sig reported 12/06/20 12/13/20  [provider]  omeprazole (PRILOSEC) 20 MG capsule Take  20 mg by mouth daily. Patient not taking: Reported on 12/12/2020    [provider]  oxyCODONE-acetaminophen (ROXICET) 5-325 MG per tablet Take 1 tablet by mouth every 4 (four) hours as needed for severe pain. Patient not taking: Reported on 12/12/2020 01/06/15   Newt Minion, MD  promethazine (PHENERGAN) 25 MG tablet Take 25 mg by mouth every 6 (six) hours as needed. Patient not taking: No sig reported 12/10/20   [provider]    Allergies    Latex, Levaquin [levofloxacin in d5w], Keflex [cephalexin], Lisinopril, and Sulfa antibiotics  Review of Systems   Review of Systems  Constitutional:  Negative for fever.  Respiratory:  Negative for shortness of breath.   Cardiovascular:  Negative for chest pain and leg swelling.  Gastrointestinal:  Negative for abdominal distention, abdominal pain and constipation.  Genitourinary:  Negative for difficulty urinating, dysuria, flank pain, frequency and urgency.  Musculoskeletal:  Positive for back pain. Negative for gait problem and joint swelling.  Skin:  Negative for rash.  Neurological:  Negative for weakness and numbness.  All other systems reviewed and are negative.  Physical Exam Updated Vital Signs BP (!) 142/66 (BP Location: Left Arm)   Pulse 88   Temp 98.6 F (37 C) (Oral)    Resp 18   Ht '5\' 7"'$  (1.702 m)   Wt (!) 181.4 kg   SpO2 98%   BMI 62.65 kg/m   Physical Exam Vitals and nursing note reviewed.  Constitutional:      Appearance: He is well-developed.  HENT:     Head: Normocephalic.  Eyes:     Conjunctiva/sclera: Conjunctivae normal.  Cardiovascular:     Rate and Rhythm: Normal rate.     Pulses: Normal pulses.          Dorsalis pedis pulses are 2+ on the right side and 2+ on the left side.     Comments: Pedal pulses normal. Pulmonary:     Effort: Pulmonary effort is normal.  Abdominal:     General: Bowel sounds are normal. There is no distension.     Palpations: Abdomen is soft. There is no mass.  Musculoskeletal:        General: Normal range of motion.     Cervical back: Normal range of motion and neck supple.     Lumbar back: No swelling, edema or spasms.  Skin:    General: Skin is warm and dry.  Neurological:     Mental Status: He is alert and oriented to person, place, and time.     Sensory: No sensory deficit.     Motor: No tremor or atrophy.     Gait: Gait normal.     Comments: Pt can flex/ext ankles without foot drop when supine.  Unable to left left leg secondary to pain and perceived weakness.  Sensation to fine touch left leg described as "sharper" then the right.  Unable to assess dtr's secondary to body habitus (pt cannot sit on side of bed),    ED Results / Procedures / Treatments   Labs (all labs ordered are listed, but only abnormal results are displayed) Labs Reviewed  CBC WITH DIFFERENTIAL/PLATELET - Abnormal; Notable for the following components:      Result Value   WBC 14.7 (*)    RBC 3.91 (*)    Hemoglobin 11.5 (*)    HCT 36.7 (*)    RDW 15.9 (*)    Platelets 138 (*)    Neutro Abs 11.9 (*)  Monocytes Absolute 1.7 (*)    Abs Immature Granulocytes 0.24 (*)    All other components within normal limits  BASIC METABOLIC PANEL - Abnormal; Notable for the following components:   Glucose, Bld 126 (*)    BUN 44 (*)     Creatinine, Ser 3.05 (*)    Calcium 7.6 (*)    GFR, Estimated 22 (*)    All other components within normal limits  RESP PANEL BY RT-PCR (FLU A&B, COVID) ARPGX2    EKG None  Radiology CT Lumbar Spine Wo Contrast  Result Date: 12/12/2020 CLINICAL DATA:  Fall with back pain EXAM: CT LUMBAR SPINE WITHOUT CONTRAST TECHNIQUE: Multidetector CT imaging of the lumbar spine was performed without intravenous contrast administration. Multiplanar CT image reconstructions were also generated. COMPARISON:  MRI 04/22/2014, CT 12/09/2020 FINDINGS: Segmentation: 5 lumbar type vertebrae. Alignment: Mild scoliosis.  Sagittal alignment within normal limits. Vertebrae: No acute fracture or focal pathologic process. Paraspinal and other soft tissues: No paravertebral or paraspinal soft tissue abnormality. Left kidney stone or intrarenal vascular calcification. Disc levels: At L1-L2, maintained disc space. Degenerative osteophyte. No significant canal stenosis or foraminal narrowing. At L2-L3, maintained disc space. No significant canal stenosis. The foramen are patent bilaterally. At L3-L4, disc space narrowing and osteophyte. Right foraminal disc protrusion and spur spurring. Mild to moderate right foraminal narrowing. At L4-L5, mild disc space narrowing and osteophyte. Right foraminal disc protrusion and spurring with moderate foraminal narrowing. At L5 S1, moderate disc space narrowing and spurring. No significant canal stenosis. Facet degenerative changes. Severe bilateral foraminal narrowing IMPRESSION: 1. No acute osseous abnormality. MRI follow-up as indicated given history. 2. Multilevel degenerative change with severe bilateral foraminal stenosis at L5-S1. Electronically Signed   By: Donavan Foil M.D.   On: 12/12/2020 19:02    Procedures Procedures   Medications Ordered in ED Medications  HYDROmorphone (DILAUDID) injection 1 mg (1 mg Intramuscular Given 12/12/20 1217)  ondansetron (ZOFRAN-ODT) disintegrating  tablet 4 mg (4 mg Oral Given 12/12/20 1217)    ED Course  I have reviewed the triage vital signs and the nursing notes.  Pertinent labs & imaging results that were available during my care of the patient were reviewed by me and considered in my medical decision making (see chart for details).    MDM Rules/Calculators/A&P                           Pt with known lumbar disk herniation, chronic, unclear which level with increased pain low back radicular to left, with weakness and collapse prior to arrival.  Known new pubic rami fracture secondary to fall 5 days ago, CT imaging from [facility] during this visit - negative cauda equina/lumbar injury.  No perineal numbness today.  Attempt to send pt for MRI lumbar and thoracic for more definitive eval - pt unable to fit in MRI scanner.  Discussed with MRI, no larger MRI available at other sites.    Attempt to ambulate patient using assistance (uses cane at baseline), pt unable to weight bear or ambulate. He states he is currently unable to care for self at home given pain and decreased mobility issues.    Pt discussed with Dr. Sabra Heck. Advised neurosurg input.  Pt will probably need admission since unable to bear weight, not safe for dc home.  Discussed with Dr. Zada Finders, describing mechanism, outside imaging, presentation today.  Suggests CT myelogram for further diagnostics, although pt may not be a good  surgical candidate at baseline.    Labs resulted, his creatinine is elevated at 3.05 today.  Discussed with who states his creatine is chronically between 1.8-2.5.  Discussed pt with Dr. Bridgett Larsson who will see pt in ed, requested non contrast CT lumbar given new fall this am.  He may need nephrology consult if there is any suspected impingement and CT myelo is being considered.   Pt seen by Dr. Bridgett Larsson, requesting PT eval now to determine if there is benefit of admission.  PT not in house at time of request.  Ct imaging complete with no emergent findings,  no cord compression per CT imaging above.  There is no medical indication for hospital admission at this time.    Discussed options with patient but that there is no indication for admission.  He does have referrals for neurosurgical and pain management care which was ordered per ED visit to [facility] on 8/2.  He was encouraged to followup on these referrals.  Pt agrees with plan for dc home.  He was also advised recheck of his renal function by pcp early next week with slightly bumped creatinine above his baseline. He was prescribed oxycodone 10/325 on 12/09/20 by his pcp.  Encouraged to continue this medicine.    Final Clinical Impression(s) / ED Diagnoses Final diagnoses:  Lumbar radiculopathy  Chronic left-sided low back pain with left-sided sciatica  Chronic renal impairment, unspecified CKD stage    Rx / DC Orders ED Discharge Orders     None        Landis Martins 12/12/20 1533    Evalee Jefferson, PA-C 12/12/20 1934    Noemi Chapel, MD 12/13/20 [phone]

## 2020-12-12 NOTE — ED Notes (Signed)
Pt discharged from facility per PA Idol, Almyra Free. However, Pt is unable to ambulate without maximum assistance and informed this RN he has been staying in the [place], in [place] and his wife has been traveling there during the daytime to take care of him. Per Pts spouse, Eddie Fletcher ([id number]) Pt cannot get in their home due to size and chronic pain issues. Pt was brought in by EMS and unable to be transported safely by other means. EDP notified and verbalized he would place a TOC Consult for Pt.

## 2020-12-12 NOTE — ED Notes (Signed)
Pt repositioned and given a pillow under his arm.

## 2020-12-12 NOTE — ED Notes (Signed)
Pt returned from CT °

## 2020-12-12 NOTE — ED Notes (Signed)
Pt unable to ambulate, took 1 step and was set in wheelchair

## 2020-12-12 NOTE — Subjective & Objective (Addendum)
CC: low back pain HPI: 65 year old male with a history of chronic low back pain, super morbid obesity with a BMI of 62 presents to the ER today with worsening back pain.  Patient states that he had to go to his father's funeral in the [place] last week.  He states that he gone to his car with his family.  He drove all the way to [place].  He developed worsening of his chronic back pain at that point.  He had stopped taking his gabapentin for an unknown reason.  He was seen in  a [facility] in [place], [place]) on December 04, 2020.  He was given some subcutaneous Dilaudid and some Norflex.  He was discharged to home.  Patient got to his car continue to drive home.  He then stopped at the ER in [facility] with the same complaints of back pain.  He was given some pain medicine again and discharged from the [place] ER.  Patient arrived to [facility].  He continued complaint of pain and then presented to the [facility] in [place] on 12/06/2020.  He continued complained of back pain at that point.  CT of his lumbar spine demonstrated multilevel lumbar degenerative changes.  There is an L5-S1 disc herniation with left lateral recess effacement and neural foraminal narrowing.  Patient is given a prescription for some steroids and a follow-up appointment in the neurosurgery was arranged.  Patient then went to the [facility] ER in [place] on December 07, 2020 again complaining of back pain.  Patient given 1 dose of Toradol 15 mg.  Patient was discharged from the [facility] ER.  Patient then again presented to [facility] ER on December 08, 2020 again with complaints of back pain.  Patient is noted to be able to transfer out of his chair while he was in the ER.  Patient had an appointment with his family practice Dr. Ledora Bottcher on December 09, 2020.  At that appointment the patient wanted to be admitted to the hospital but his PCP recommended the patient go home.  According to the PCP  note, "He wanted to be admitted to the hospital and he was denied. He supposedly has a very difficult living situation and he and his wife are hoarders".  Patient then presented to [facility] ER on December 09, 2020 after his PCP appointment complaining of a fall.  According to the ER note, the patient stated to the ER provider that he actually had fallen on 12/08/2020.  Patient CT scan was negative for any pelvic fractures.  CT lumbar spine did demonstrate some disc bulge and endplate spurring along with foraminal stenosis.  It should be noted that over the last 6 ER visits from 12/04/2020, at no point were any ER providers concerned about cauda equina syndrome given the patient's chronic history of back pain, no saddle anesthesia, and the patient's ability to ambulate.  Patient presented to the [facility] ER today.  I was able to talk with the EMS team that brought him to the ER today.  They state that they were also the same EMS team that brought the patient to the [facility] ER on 12/09/2020.  They remember this patient quite vividly.  They state that the patient told them on their arrival that he is not in any pain but is having difficulty walking.  EMS team reports that given the patient's large size, they had to call for lifting assistance.  While the EMS team  today was waiting for lifting assistance, the patient started screaming that he was in severe pain even though just 5 minutes before the EMS team verified the patient was not any pain.  The medicine team stated that he did the same thing to them on 12/09/2020 when they took him to the [facility] ER.  EMS team thinks the patient has some sort of secondary gain by coming to the ER.  In the ER today, the patient had laboratory work that showed a mild increase in his serum creatinine to 3.0.  Patient follows with Dr. Aundra Dubin [facility] in [place].  Patient has a baseline creatinine approximately 2.0-2.5.  Patient is not a  candidate for transplant due to his obesity.  Patient states that he did pick up his Medrol Dosepak and has been taking this.  This likely explains his mild leukocytosis.  Of note, the patient states that he has been living in a motel for the last 5 days.  He states that his wife cannot take care of him.  When questioned if his motel is expensive, the patient states that it is "starting to become that".  Pt's girth too large to fit into MRI scanner.

## 2020-12-12 NOTE — Discharge Instructions (Addendum)
Continue taking your home medications including oxycodone you are prescribed by Dr. Neta Mends.  You should also finish your prednisone taper, continue taking your gabapentin.  Plan to see both the pain specialist and the neurosurgeon that you referred to at your visit to [facility] on the second.  Avoid activities that worsen your back pain.  Your CT imaging is stable today.  As discussed your creatinine is a little bit elevated as well and this should be rechecked by your primary doctor early next week to ensure that it is stable.

## 2020-12-12 NOTE — ED Triage Notes (Signed)
Pt complaining of shoulder and back pain has been to [place], [place], [place], [place], [place] since 7/26.  [facility] told him he had a cracked pelvis.  Pt pain 10/10. BS [id number]

## 2020-12-12 NOTE — ED Notes (Signed)
Patient transported to MRI 

## 2020-12-12 NOTE — Consult Note (Signed)
History and Physical    Eddie Fletcher [id number] DOB: [birth year] DOA: 12/12/2020  PCP: Curly Rim, MD   Patient coming from: Home  I have personally briefly reviewed patient's old medical records in [place]  CC: low back pain HPI: 65 year old male with a history of chronic low back pain, super morbid obesity with a BMI of 62 presents to the ER today with worsening back pain.  Patient states that he had to go to his father's funeral in the [place] last week.  He states that he gone to his car with his family.  He drove all the way to [place].  He developed worsening of his chronic back pain at that point.  He had stopped taking his gabapentin for an unknown reason.  He was seen in  a [facility] in [place], [place]) on December 04, 2020.  He was given some subcutaneous Dilaudid and some Norflex.  He was discharged to home.  Patient got to his car continue to drive home.  He then stopped at the ER in [facility] with the same complaints of back pain.  He was given some pain medicine again and discharged from the [place] ER.  Patient arrived to [facility].  He continued complaint of pain and then presented to the [facility] in [place] on 12/06/2020.  He continued complained of back pain at that point.  CT of his lumbar spine demonstrated multilevel lumbar degenerative changes.  There is an L5-S1 disc herniation with left lateral recess effacement and neural foraminal narrowing.  Patient is given a prescription for some steroids and a follow-up appointment in the neurosurgery was arranged.  Patient then went to the [facility] ER in [place] on December 07, 2020 again complaining of back pain.  Patient given 1 dose of Toradol 15 mg.  Patient was discharged from the [facility] ER.  Patient then again presented to [facility] ER on December 08, 2020 again with complaints of back pain.  Patient is noted to be able to transfer out of his chair while he was in  the ER.  Patient had an appointment with his family practice Dr. Ledora Bottcher on December 09, 2020.  At that appointment the patient wanted to be admitted to the hospital but his PCP recommended the patient go home.  According to the PCP note, "He wanted to be admitted to the hospital and he was denied. He supposedly has a very difficult living situation and he and his wife are hoarders".  Patient then presented to [facility] ER on December 09, 2020 after his PCP appointment complaining of a fall.  According to the ER note, the patient stated to the ER provider that he actually had fallen on 12/08/2020.  Patient CT scan was negative for any pelvic fractures.  CT lumbar spine did demonstrate some disc bulge and endplate spurring along with foraminal stenosis.  It should be noted that over the last 6 ER visits from 12/04/2020, at no point were any ER providers concerned about cauda equina syndrome given the patient's chronic history of back pain, no saddle anesthesia, and the patient's ability to ambulate.  Patient presented to the [facility], ER today.  I was able to talk with the EMS team that brought him to the ER today.  They state that they were also the same EMS team that brought the patient to the [facility] ER on 12/09/2020.  They remember this patient quite vividly.  They state that  the patient told them on their arrival that he is not in any pain but is having difficulty walking.  EMS team reports that given the patient's large size, they had to call for lifting assistance.  While the EMS team today was waiting for lifting assistance, the patient started screaming that he was in severe pain even though just 5 minutes before the EMS team verified the patient was not any pain.  The medicine team stated that he did the same thing to them on 12/09/2020 when they took him to the [facility] ER.  EMS team thinks the patient has some sort of secondary gain by coming to the ER.  In the ER today, the  patient had laboratory work that showed a mild increase in his serum creatinine to 3.0.  Patient follows with Dr. [facility]undra [facility]in [facility] [place].  Patient has a baseline creatinine approximately 2.0-2.5.  Patient is not a candidate for transplant due to his obesity.  Patient states that he did pick up his Medrol Dosepak and has been taking this.  This likely explains his mild leukocytosis.  Of note, the patient states that he has been living in a motel for the last 5 days.  He states that his wife cannot take care of him.  When questioned if his motel is expensive, the patient states that it is "starting to become that".  Pt's girth too large to fit into MRI scanner.   ED Course: pt seen in ER. Given 1 dose of IM dilaudid(1 mg).  Review of Systems:  Review of Systems  Constitutional: Negative.   HENT: Negative.    Eyes: Negative.   Respiratory:  Positive for cough. Negative for sputum production.        Chronic cough  Cardiovascular: Negative.   Musculoskeletal:  Positive for back pain.       Chronic low back pain. Worse since he went to [place] to go to his father's funeral. C/o left knee buckling under his weight. Occ shooting pain in his left leg. Denies any urinary incontinence. Has had some constipation since taking hydrocodone. No fecal incontinence. No buttock saddle anesthesia.  Skin: Negative.   Neurological:        C/o difficulty walking due to pain in left leg and his chronic low back pain  Endo/Heme/Allergies: Negative.   Psychiatric/Behavioral: Negative.    All other systems reviewed and are negative.  Past Medical History:  Diagnosis Date   Arthritis    Cardiomegaly    Cellulitis    Chest discomfort    15 years ago; had negative testing and told it was related to stress   Colon polyps    Complication of anesthesia    blood pressure - low post op   Diaphoresis    Edema    Leg   GERD (gastroesophageal reflux disease)    Hemorrhoids     History of kidney stones    Hypertension    Obesity    Seasonal allergies    Sleep apnea    cpap 3 yrs-not used for 3-4 months   SOB (shortness of breath)    occ with exersion   Wears glasses     Past Surgical History:  Procedure Laterality Date   APPLICATION OF WOUND VAC Right 01/03/2015   Procedure: APPLICATION OF INCISIONAL  WOUND VAC;  Surgeon: Newt Minion, MD;  Location: [facility];  Service: Orthopedics;  Laterality: Right;   CARPAL TUNNEL RELEASE Bilateral 04   COLONOSCOPY W/ BIOPSIES  AND POLYPECTOMY     KNEE ARTHROSCOPY Right 12   TOTAL KNEE ARTHROPLASTY Right 01/30/2014   Procedure: Right Total Knee Arthroplasty;  Surgeon: Newt Minion, MD;  Location: [facility];  Service: Orthopedics;  Laterality: Right;   TOTAL KNEE REVISION Right 01/03/2015   Procedure: Revision Right Total Knee Arthroplasty, Poly Exchange, Place Antibiotic Beads;  Surgeon: Newt Minion, MD;  Location: [facility];  Service: Orthopedics;  Laterality: Right;   TRIGGER FINGER RELEASE Right 12   rt thumb     reports that he has never smoked. He has never used smokeless tobacco. He reports that he does not drink alcohol and does not use drugs.  Allergies  Allergen Reactions   Latex Other (See Comments)    Latex cath caused burning and irritation Latex cath caused burning and irritation    Levaquin [Levofloxacin In D5w] Other (See Comments)    Muscle aches   Keflex [Cephalexin] Rash   Lisinopril Cough   Sulfa Antibiotics Rash    Family History  Problem Relation Age of Onset   Peripheral vascular disease Father    Alcoholism Mother    Hypertension Maternal Grandfather    Leukemia Maternal Aunt    Breast cancer Maternal Aunt     Prior to Admission medications   Medication Sig Start Date End Date Taking? Authorizing Provider  acetaminophen (TYLENOL) 500 MG tablet Take 1,000 mg by mouth every 6 (six) hours as needed for moderate pain.   Yes [provider]  albuterol (PROVENTIL HFA;VENTOLIN HFA) 108  (90 BASE) MCG/ACT inhaler Inhale 1-2 puffs into the lungs every 6 (six) hours as needed for wheezing or shortness of breath.   Yes [provider]  cholecalciferol (VITAMIN D) 1000 UNITS tablet Take 1,000 Units by mouth daily.   Yes [provider]  clotrimazole (LOTRIMIN) 1 % cream Apply topically. 02/27/20 02/26/21 Yes [provider]  doxycycline (VIBRA-TABS) 100 MG tablet Take 100 mg by mouth 2 (two) times daily. 10/15/20 10/15/21 Yes [provider]  ferrous gluconate (FERGON) 324 MG tablet Take by mouth. 11/12/19  Yes [provider]  fluticasone (FLONASE) 50 MCG/ACT nasal spray Place 1 spray into both nostrils daily.   Yes [provider]  folic acid (FOLVITE) 1 MG tablet Take by mouth. 11/21/17  Yes [provider]  gabapentin (NEURONTIN) 100 MG capsule Take 100 mg by mouth 2 (two) times daily. 12/06/20  Yes [provider]  HYDROcodone-acetaminophen (NORCO) 10-325 MG tablet Take by mouth. 12/04/20  Yes [provider]  tamsulosin (FLOMAX) 0.4 MG CAPS capsule Take 0.4 mg by mouth at bedtime.   Yes [provider]  Cholecalciferol 50 MCG (2000 UT) TABS Take by mouth. Patient not taking: No sig reported 01/25/18   [provider]  cyclobenzaprine (FLEXERIL) 10 MG tablet Take 10 mg by mouth 3 (three) times daily as needed for muscle spasms. Patient not taking: Reported on 12/12/2020    [provider]  doxycycline (VIBRA-TABS) 100 MG tablet Take 1 tablet (100 mg total) by mouth 2 (two) times daily. Patient not taking: No sig reported 04/09/15   Campbell Riches, MD  fluticasone [facility]) 50 MCG/ACT nasal spray Place into the nose. Patient not taking: No sig reported 11/14/17 11/07/21  [provider]  ibuprofen (ADVIL) 400 MG tablet Take by mouth. Patient not taking: No sig reported 12/10/20   [provider]  methylPREDNISolone (MEDROL DOSEPAK) 4 MG TBPK tablet See admin  instructions. Patient not taking: No sig  reported 12/06/20 12/13/20  [provider]  omeprazole (PRILOSEC) 20 MG capsule Take 20 mg by mouth daily. Patient not taking: Reported on 12/12/2020    [provider]  oxyCODONE-acetaminophen (ROXICET) 5-325 MG per tablet Take 1 tablet by mouth every 4 (four) hours as needed for severe pain. Patient not taking: Reported on 12/12/2020 01/06/15   Newt Minion, MD  promethazine (PHENERGAN) 25 MG tablet Take 25 mg by mouth every 6 (six) hours as needed. Patient not taking: No sig reported 12/10/20   [provider]    Physical Exam: Vitals:   12/12/20 1107 12/12/20 1111 12/12/20 1417 12/12/20 1729  BP:   140/68 (!) 142/66  Pulse:   98 88  Resp:   18 18  Temp:      TempSrc:      SpO2:   98% 98%  Weight:  (!) 181.4 kg    Height: '5\' 7"'$  (1.702 m)       Physical Exam Vitals and nursing note reviewed.  Constitutional:      General: He is not in acute distress.    Appearance: He is obese. He is not ill-appearing, toxic-appearing or diaphoretic.  HENT:     Head: Normocephalic and atraumatic.     Nose: Nose normal.  Cardiovascular:     Rate and Rhythm: Normal rate and regular rhythm.  Pulmonary:     Effort: Pulmonary effort is normal.  Abdominal:     General: Bowel sounds are normal. There is no distension.     Tenderness: There is no abdominal tenderness. There is no guarding.  Skin:    General: Skin is warm and dry.  Neurological:     General: No focal deficit present.     Mental Status: He is alert and oriented to person, place, and time.     Labs on Admission: I have personally reviewed following labs and imaging studies  CBC: Recent Labs  Lab 12/12/20 1353  WBC 14.7*  NEUTROABS 11.9*  HGB 11.5*  HCT 36.7*  MCV 93.9  PLT 0000000*   Basic Metabolic Panel: Recent Labs  Lab 12/12/20 1353  NA 135  K 3.9  CL 106  CO2 24  GLUCOSE 126*  BUN 44*  CREATININE 3.05*  CALCIUM 7.6*   GFR: Estimated Creatinine  Clearance: 38.8 mL/min (A) (by C-G formula based on SCr of 3.05 mg/dL (H)). Liver Function Tests: No results for input(s): AST, ALT, ALKPHOS, BILITOT, PROT, ALBUMIN in the last 168 hours. No results for input(s): LIPASE, AMYLASE in the last 168 hours. No results for input(s): AMMONIA in the last 168 hours. Coagulation Profile: No results for input(s): INR, PROTIME in the last 168 hours. Cardiac Enzymes: No results for input(s): CKTOTAL, CKMB, CKMBINDEX, TROPONINI in the last 168 hours. BNP (last 3 results) No results for input(s): PROBNP in the last 8760 hours. HbA1C: No results for input(s): HGBA1C in the last 72 hours. CBG: No results for input(s): GLUCAP in the last 168 hours. Lipid Profile: No results for input(s): CHOL, HDL, LDLCALC, TRIG, CHOLHDL, LDLDIRECT in the last 72 hours. Thyroid Function Tests: No results for input(s): TSH, T4TOTAL, FREET4, T3FREE, THYROIDAB in the last 72 hours. Anemia Panel: No results for input(s): VITAMINB12, FOLATE, FERRITIN, TIBC, IRON, RETICCTPCT in the last 72 hours. Urine analysis:    Component Value Date/Time   COLORURINE AMBER (A) 01/08/2015 2044   APPEARANCEUR CLOUDY (A) 01/08/2015 2044   LABSPEC 1.017 01/08/2015 2044   PHURINE 5.0 01/08/2015 2044   GLUCOSEU NEGATIVE  01/08/2015 2044   HGBUR NEGATIVE 01/08/2015 2044   BILIRUBINUR NEGATIVE 01/08/2015 2044   KETONESUR NEGATIVE 01/08/2015 2044   PROTEINUR NEGATIVE 01/08/2015 2044   UROBILINOGEN 0.2 01/08/2015 2044   NITRITE NEGATIVE 01/08/2015 2044   LEUKOCYTESUR TRACE (A) 01/08/2015 2044    Radiological Exams on Admission: I have personally reviewed images CT Lumbar Spine Wo Contrast  Result Date: 12/12/2020 CLINICAL DATA:  Fall with back pain EXAM: CT LUMBAR SPINE WITHOUT CONTRAST TECHNIQUE: Multidetector CT imaging of the lumbar spine was performed without intravenous contrast administration. Multiplanar CT image reconstructions were also generated. COMPARISON:  MRI 04/22/2014, CT  12/09/2020 FINDINGS: Segmentation: 5 lumbar type vertebrae. Alignment: Mild scoliosis.  Sagittal alignment within normal limits. Vertebrae: No acute fracture or focal pathologic process. Paraspinal and other soft tissues: No paravertebral or paraspinal soft tissue abnormality. Left kidney stone or intrarenal vascular calcification. Disc levels: At L1-L2, maintained disc space. Degenerative osteophyte. No significant canal stenosis or foraminal narrowing. At L2-L3, maintained disc space. No significant canal stenosis. The foramen are patent bilaterally. At L3-L4, disc space narrowing and osteophyte. Right foraminal disc protrusion and spur spurring. Mild to moderate right foraminal narrowing. At L4-L5, mild disc space narrowing and osteophyte. Right foraminal disc protrusion and spurring with moderate foraminal narrowing. At L5 S1, moderate disc space narrowing and spurring. No significant canal stenosis. Facet degenerative changes. Severe bilateral foraminal narrowing IMPRESSION: 1. No acute osseous abnormality. MRI follow-up as indicated given history. 2. Multilevel degenerative change with severe bilateral foraminal stenosis at L5-S1. Electronically Signed   By: Donavan Foil M.D.   On: 12/12/2020 19:02   CT lumbar spine without contrast  Anatomical Region Laterality Modality  Spine -- Computed Tomography  L-spine -- --    Impression  1. No acute fracture or static subluxation of the lumbar spine.  2. Severe bilateral L5-S1 neural foraminal stenosis due to disc  bulge and endplate spurring.  3. Right subarticular/foraminal disc protrusion at L3-4 with right  lateral recess narrowing.    Electronically Signed    By: Ulyses Jarred M.D.    On: 12/10/2020 00:04  Narrative  CLINICAL DATA:  back pain ; Low back pain, trauma Fall. Back pain.  Pelvis fracture. Unable to walk   EXAM:  CT LUMBAR SPINE WITHOUT CONTRAST   TECHNIQUE:  Multidetector CT imaging of the lumbar spine was performed  without  intravenous contrast administration. Multiplanar CT image  reconstructions were also generated.   COMPARISON:  None.   FINDINGS:  Segmentation: 5 lumbar type vertebrae.   Alignment: Normal.   Vertebrae: No acute fracture or focal pathologic process.   Paraspinal and other soft tissues: Negative.   Disc levels: The upper lumbar levels are unremarkable.   L3-4: There is a right subarticular/foraminal disc protrusion with  endplate spurring. Right lateral recess narrowing. No neural  foraminal stenosis.   L4-5: Right subarticular/foraminal protrusion and endplate spurring  with mild right foraminal stenosis.   L5-S1: Severe bilateral neural foraminal stenosis due to disc bulge  and endplate spurring. Mild facet hypertrophy.   CT Spine  Lumbar WO IV Contrast  Anatomical Region Laterality Modality  T-spine -- Computed Tomography  L-spine -- --  Pelvis -- --    Impression  IMPRESSION:  1.  Multilevel lumbar degenerative changes. Most notable is L5-S1 disc herniation causing left lateral recess effacement and neural foraminal  narrowing. There are also findings at L3-4 causing some right lateral recess effacement and right foraminal   Electronically Signed by: Derrel Nip on  12/06/2020 11:27 PM  Narrative  TECHNIQUE: Routine noncontrast CT lumbar spine was performed. Coronal and sagittal reformatted images were obtained and reviewed. Radiation dose reduction was utilized (automated exposure control, mA or kV adjustment based on patient size, or iterative image reconstruction).  INDICATION: Lumbar radiculopathy, no red flags, no prior management  Compression fracture, lumbar    COMPARISON:  None   FINDINGS:  No acute fracture.  Vertebral body heights are preserved.  Spinal alignment is preserved.  No destructive osseous lesions.  L1-2: There is no significant disc protrusion/herniation, spinal stenosis or neuroforaminal narrowing.  L2-3: Mildly narrowed  vacuum intravertebral disc and minimal endplate spurring. No spinal stenosis or neural foraminal narrowing.  L3-4: Mildly narrowed intervertebral disc. Diffuse mild disc bulge and mild facet arthropathy with and mild endplate spurring on the right. Findings cause mild to moderate effacement of the right lateral recess and right neural foraminal narrowing.  L4-5: Moderately narrowed vacuum intravertebral disc with mild endplate spurring. Mild facet arthropathy. There is no significant spinal stenosis. There is mild effacement of right lateral recess.  L5-S1: Moderately narrowed acumen or vertebral disc with mild diffuse disc bulge at probable superimposed left of midline herniation. This causes mild central spinal stenosis, moderate effacement of the left lateral recess and moderate to marked left neural foraminal narrowing combination moderate facetarthropathy.  Procedure Note  Derrel Nip, MD - 12/06/2020  Formatting of this note might be different from the original.  TECHNIQUE: Routine noncontrast CT lumbar spine was performed. Coronal and sagittal reformatted images were obtained and reviewed. Radiation dose reduction was utilized (automated exposure control, mA or kV adjustment based on patient size, or iterative image reconstruction).  INDICATION: Lumbar radiculopathy, no red flags, no prior management  Compression fracture, lumbar    COMPARISON:  None   FINDINGS:  No acute fracture.  Vertebral body heights are preserved.  Spinal alignment is preserved.  No destructive osseous lesions.  L1-2: There is no significant disc protrusion/herniation, spinal stenosis or neuroforaminal narrowing.  L2-3: Mildly narrowed vacuum intravertebral disc and minimal endplate spurring. No spinal stenosis or neural foraminal narrowing.  L3-4: Mildly narrowed intervertebral disc. Diffuse mild disc bulge and mild facet arthropathy with and mild endplate spurring on the right. Findings cause mild to moderate  effacement of the right lateral recess and right neural foraminal narrowing.  L4-5: Moderately narrowed vacuum intravertebral disc with mild endplate spurring. Mild facet arthropathy. There is no significant spinal stenosis. There is mild effacement of right lateral recess.  L5-S1: Moderately narrowed acumen or vertebral disc with mild diffuse disc bulge at probable superimposed left of midline herniation. This causes mild central spinal stenosis, moderate effacement of the left lateral recess and moderate to marked left neural foraminal narrowing combination moderate facet arthropathy.     IMPRESSION:  1.  Multilevel lumbar degenerative changes. Most notable is L5-S1 disc herniation causing left lateral recess effacement and neural foraminal  narrowing. There are also findings at L3-4 causing some right lateral recesseffacement and right foraminal   EKG: I have personally reviewed EKG: no EKG performed  Assessment/Plan Active Problems:   Chronic low back pain with left-sided sciatica    Chronic low back pain with left-sided sciatica This has been chronic and likely exacerbated over the last week and a half due to his trip to [place].  Patient has been to multiple ERs over the last week.  Today's visit to the ER makes his seventh ER visit in 9 days.  Do not suspect cauda equina syndrome.  CT scan has been ordered of his lumbar spine since the patient has been inconsistent with this history about how many times he has fallen.  I have asked the ER provider to contact physical therapy for an assessment.  At this point I do not think the patient needs to be admitted.  I think he needs home health PT.  **Update. Pt's CT lumbar is unremarkable to the cause of pt's complains of 7 visits to 7 different ERs over 9 days.  Pt does not have a medical need to be admitted to the hospital. Discussed this with EDP. Pt will need Transition of Care(TOC) consult if pt insists that he need SNF placement. It may be  better for patient to go home as his referral to neurosurgery and pain management have already been made through [facility].  Admission status:  Does not need to be admitted ,      Kristopher Oppenheim, DO [facility] 12/12/2020, 7:48 PM

## 2020-12-12 NOTE — Assessment & Plan Note (Addendum)
This has been chronic and likely exacerbated over the last week and a half due to his trip to [place].  Patient has been to multiple ERs over the last week.  Today's visit to the ER makes his seventh ER visit in 9 days.  Do not suspect cauda equina syndrome.  CT scan has been ordered of his lumbar spine since the patient has been inconsistent with this history about how many times he has fallen.  I have asked the ER provider to contact physical therapy for an assessment.  At this point I do not think the patient needs to be admitted.  I think he needs home health PT.  **Update. Pt's CT lumbar is unremarkable to the cause of pt's complains of 7 visits to 7 different ERs over 9 days.  Pt does not have a medical need to be admitted to the hospital. Discussed this with EDP. Pt will need Transition of Care(TOC) consult if pt insists that he need SNF placement. It may be better for patient to go home as his referral to neurosurgery and pain management have already been made through [facility].

## 2020-12-12 NOTE — ED Notes (Addendum)
Pt resting in hall provider at side

## 2020-12-13 NOTE — Plan of Care (Signed)
  Problem: Acute Rehab PT Goals(only PT should resolve) Goal: Pt will Roll Supine to Side Outcome: Progressing Flowsheets (Taken 12/13/2020 1046) Pt will Roll Supine to Side: min guard Note: To aide in self-care Goal: Pt Will Go Supine/Side To Sit Outcome: Progressing Flowsheets (Taken 12/13/2020 1046) Pt will go Supine/Side to Sit: with moderate assist Goal: Pt Will Go Sit To Supine/Side Outcome: Progressing Flowsheets (Taken 12/13/2020 1046) Pt will go Sit to Supine/Side: with moderate assist Goal: Patient Will Perform Sitting Balance Outcome: Progressing Flowsheets (Taken 12/13/2020 1046) Patient will perform sitting balance: with min guard assist Goal: Patient Will Transfer Sit To/From Stand Outcome: Progressing Flowsheets (Taken 12/13/2020 1046) Patient will transfer sit to/from stand: with moderate assist Goal: Pt Will Transfer Bed To Chair/Chair To Bed Outcome: Progressing Flowsheets (Taken 12/13/2020 1046) Pt will Transfer Bed to Chair/Chair to Bed: with mod assist Goal: Pt Will Ambulate Outcome: Progressing Flowsheets (Taken 12/13/2020 1046) Pt will Ambulate:  25 feet  with min guard assist  with rolling walker   10:47 AM, 12/13/20 M. Sherlyn Lees, PT, DPT Physical Therapist- [place] Office Number: [phone]

## 2020-12-13 NOTE — Evaluation (Signed)
Physical Therapy Evaluation Patient Details Name: Eddie Fletcher MRN: [id number] DOB: 1955/12/30 Today's Date: 12/13/2020   History of Present Illness  65 year old male with a history of chronic low back pain, super morbid obesity with a BMI of 62 presents to the ER today with worsening back pain.  Patient states that he had to go to his father's funeral in the [place] last week.  He states that he gone to his car with his family.  He drove all the way to [place].  He developed worsening of his chronic back pain at that point.  He had stopped taking his gabapentin for an unknown reason.  He was seen in  a [facility] in [place], [place]) on December 04, 2020.  He was given some subcutaneous Dilaudid and some Norflex.  He was discharged to home.  Patient got to his car continue to drive home.  He then stopped at the ER in [facility] with the same complaints of back pain.  He was given some pain medicine again and discharged from the [place] ER.  [facility].  He continued complaint of pain and then presented to the [facility] in [place] on 12/06/2020.  He continued complained of back pain at that point.  CT of his lumbar spine demonstrated multilevel lumbar degenerative changes.  There is an L5-S1 disc herniation with left lateral recess effacement and neural foraminal narrowing.  Patient is given a prescription for some steroids and a follow-up appointment in the neurosurgery was arranged.  Patient then went to the [facility] ER in [place] on December 07, 2020 again complaining of back pain.  Patient given 1 dose of Toradol 15 mg.  Patient was discharged from the [facility] ER.   Clinical Impression   Pt exhibits increase in back and LLE pain coupled with obesity and generalized weakness with poor activity tolerance requiring max A for rolling left/right in bed. Unable to tolerate mobilization to EOB due to pain/weakness requiring immediate return to  supine position.  As such, unable to further assess tolerance to upright or additional mobility.  Due to patient body habitus and functional limitation requires increased need for assistance in all aspects of mobility and ADL.  Pt would benefit from short-term rehab at SNF level to provide additional training/instruction in functional mobility to reduce level of assistance to restore capabilities to PLOF.     Follow Up Recommendations SNF    Equipment Recommendations  Rolling walker with 5" wheels (bariatric)    Recommendations for Other Services OT consult     Precautions / Restrictions Precautions Precautions: Fall Restrictions Weight Bearing Restrictions: No      Mobility  Bed Mobility Overal bed mobility: Needs Assistance Bed Mobility: Rolling;Supine to Sit Rolling: Max assist   Supine to sit: Total assist;+2 for physical assistance;+2 for safety/equipment     General bed mobility comments: attempted mobilization to EOB with pt requiring max A for BLE advancement to EOB. Once in position attempted asisstance to sitting EOB at which pt reacted poorly to increase in back/LLE pain and fell backwards into bed and unable/unwilling to proceed further due to pain/weakness.  Provided ample education and demonstration of log rolling and mobilization techniques but pt unamenable to further attempt at this time.    Transfers Overall transfer level: Needs assistance               General transfer comment: unable to proceed due to pain/guarding/weakness  Ambulation/Gait  General Gait Details: unable to attempt due to current status/disposition  Stairs            Wheelchair Mobility    Modified Rankin (Stroke Patients Only)       Balance Overall balance assessment:  (unable to maintain EOB position)                                           Pertinent Vitals/Pain Pain Assessment: 0-10 Pain Score: 8  Pain Location: lumbar area,  LLE Pain Descriptors / Indicators: Burning;Shooting;Sharp Pain Intervention(s): Limited activity within patient's tolerance;Repositioned    Home Living Family/patient expects to be discharged to:: Skilled nursing facility                      Prior Function Level of Independence: Independent with assistive device(s)         Comments: pt reports use of cane for household ambulation, use of manual w/c for long-distance/community excursions     Hand Dominance        Extremity/Trunk Assessment   Upper Extremity Assessment Upper Extremity Assessment: Generalized weakness    Lower Extremity Assessment Lower Extremity Assessment: RLE deficits/detail;LLE deficits/detail;Generalized weakness RLE: Unable to fully assess due to pain RLE Sensation: decreased light touch RLE Coordination: WNL LLE Coordination: WNL       Communication   Communication: No difficulties  Cognition Arousal/Alertness: Awake/alert Behavior During Therapy: Restless Overall Cognitive Status: Within Functional Limits for tasks assessed                                        General Comments      Exercises General Exercises - Lower Extremity Ankle Circles/Pumps: AROM;20 reps;Supine Quad Sets: Both;20 reps Heel Slides: Both;20 reps   Assessment/Plan    PT Assessment Patient needs continued PT services  PT Problem List Decreased strength;Decreased activity tolerance;Decreased balance;Decreased mobility;Obesity;Pain       PT Treatment Interventions DME instruction;Gait training;Stair training;Functional mobility training;Therapeutic activities;Therapeutic exercise;Balance training;Neuromuscular re-education;Patient/family education    PT Goals (Current goals can be found in the Care Plan section)  Acute Rehab PT Goals Patient Stated Goal: get pain under control and be able to walk again PT Goal Formulation: With patient Time For Goal Achievement: 12/20/20 Potential to  Achieve Goals: Fair    Frequency Min 3X/week   Barriers to discharge Inaccessible home environment pt currently living in motel due to cluttered/obstructed home environment    Co-evaluation               AM-PAC PT "6 Clicks" Mobility  Outcome Measure Help needed turning from your back to your side while in a flat bed without using bedrails?: A Lot Help needed moving from lying on your back to sitting on the side of a flat bed without using bedrails?: Total Help needed moving to and from a bed to a chair (including a wheelchair)?: Total Help needed standing up from a chair using your arms (e.g., wheelchair or bedside chair)?: Total Help needed to walk in hospital room?: Total Help needed climbing 3-5 steps with a railing? : Total 6 Click Score: 7    End of Session   Activity Tolerance: Patient limited by pain Patient left: in bed;with call bell/phone within reach;with family/visitor present Nurse Communication: Mobility status PT  Visit Diagnosis: Muscle weakness (generalized) (M62.81);Difficulty in walking, not elsewhere classified (R26.2);Pain Pain - Right/Left: Left Pain - part of body: Leg    Time: [id number] PT Time Calculation (min) (ACUTE ONLY): 20 min   Charges:   PT Evaluation $PT Eval Moderate Complexity: 1 Mod PT Treatments $Therapeutic Exercise: 8-22 mins       10:45 AM, 12/13/20 M. Sherlyn Lees, PT, DPT Physical Therapist- [place] Office Number: [phone]

## 2020-12-13 NOTE — ED Notes (Signed)
PT to bedside to assess patient.

## 2020-12-13 NOTE — NC FL2 (Signed)
[place] LEVEL OF CARE SCREENING TOOL     IDENTIFICATION  Patient Name: Eddie Fletcher Birthdate: Jul 26, 1955 Sex: male Admission Date (Current Location): 12/12/2020  [facility] and [place] Number:  (P) Herbalist and Address:  (P) [facility],  [place] [address], [place]      Provider Number: Mamie [phone]  Attending Physician Name and Address:  Default, Provider, MD  Relative Name and Phone Number:  Mamie Nick) Elvert Nanda- wife [phone]    Current Level of Care: [facility]) Hospital (ED) Recommended Level of Care: (P) [place] Prior Approval Number:    Date Approved/Denied:   PASRR Number: Mamie Nick) [id number]  Discharge Plan: (P) SNF    Current Diagnoses: Patient Active Problem List   Diagnosis Date Noted   Chronic low back pain with left-sided sciatica 12/12/2020   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) 01/08/2015   Hypotension 01/08/2015   Septic joint of right knee joint ([place]) 01/08/2015   Septic joint of left knee joint ([place]) 01/03/2015   Besnier-Boeck disease 02/05/2014   Apnea, sleep 02/05/2014   Avitaminosis D 02/05/2014   BP (high blood pressure) 02/05/2014   Colon polyp 02/05/2014   Osteoarthritis of right knee 02/05/2014   Total knee replacement status 01/30/2014    Orientation RESPIRATION BLADDER Height & Weight     (P) Self, Time, Situation, Place  (P) Normal (P) Continent Weight: (!) 181.4 kg Height:  '5\' 7"'$  (170.2 cm)  BEHAVIORAL SYMPTOMS/MOOD NEUROLOGICAL BOWEL NUTRITION STATUS      (P) Continent (P) Diet (See discharge note)  AMBULATORY STATUS COMMUNICATION OF NEEDS Skin   (P) Extensive Assist (P) Verbally (P) Normal                       Personal Care Assistance Level of Assistance  (P) Bathing, Dressing Bathing Assistance: (P) Maximum  assistance   Dressing Assistance: (P) Maximum assistance     Functional Limitations Info             SPECIAL CARE FACTORS FREQUENCY  (P) OT (By licensed OT), PT (By licensed PT)     PT Frequency: (P) 5x week OT Frequency: (P) 5x week            Contractures Contractures Info: (P) Not present    Additional Factors Info  (P) Code Status Code Status Info: (P) Full code             Current Medications (12/13/2020):  This is the current hospital active medication list Current Facility-Administered Medications  Medication Dose Route Frequency Provider Last Rate Last Admin   acetaminophen (TYLENOL) tablet 650 mg  650 mg Oral Q4H PRN Noemi Chapel, MD   650 mg at 12/13/20 0833   ondansetron [facility]) tablet 4 mg  4 mg Oral Q8H PRN Noemi Chapel, MD       Current Outpatient Medications  Medication Sig Dispense Refill   acetaminophen (TYLENOL) 500 MG tablet Take 1,000 mg by mouth every 6 (six) hours as needed for moderate pain.     albuterol (PROVENTIL HFA;VENTOLIN HFA) 108 (90 BASE) MCG/ACT inhaler Inhale 1-2 puffs into the lungs every 6 (six) hours as needed for wheezing or shortness of breath.     cholecalciferol (VITAMIN D) 1000 UNITS tablet Take 1,000 Units by mouth daily.  clotrimazole (LOTRIMIN) 1 % cream Apply topically.     doxycycline (VIBRA-TABS) 100 MG tablet Take 100 mg by mouth 2 (two) times daily.     ferrous gluconate (FERGON) 324 MG tablet Take by mouth.     fluticasone (FLONASE) 50 MCG/ACT nasal spray Place 1 spray into both nostrils daily.     folic acid (FOLVITE) 1 MG tablet Take by mouth.     gabapentin (NEURONTIN) 100 MG capsule Take 100 mg by mouth 2 (two) times daily.     HYDROcodone-acetaminophen (NORCO) 10-325 MG tablet Take by mouth.     tamsulosin (FLOMAX) 0.4 MG CAPS capsule Take 0.4 mg by mouth at bedtime.     Cholecalciferol 50 MCG (2000 UT) TABS Take by mouth. (Patient not taking: No sig reported)     cyclobenzaprine (FLEXERIL) 10 MG tablet  Take 10 mg by mouth 3 (three) times daily as needed for muscle spasms. (Patient not taking: Reported on 12/12/2020)     doxycycline (VIBRA-TABS) 100 MG tablet Take 1 tablet (100 mg total) by mouth 2 (two) times daily. (Patient not taking: No sig reported) 180 tablet 2   fluticasone (FLONASE) 50 MCG/ACT nasal spray Place into the nose. (Patient not taking: No sig reported)     ibuprofen (ADVIL) 400 MG tablet Take by mouth. (Patient not taking: No sig reported)     methylPREDNISolone (MEDROL DOSEPAK) 4 MG TBPK tablet See admin instructions. (Patient not taking: No sig reported)     omeprazole (PRILOSEC) 20 MG capsule Take 20 mg by mouth daily. (Patient not taking: Reported on 12/12/2020)     oxyCODONE-acetaminophen (ROXICET) 5-325 MG per tablet Take 1 tablet by mouth every 4 (four) hours as needed for severe pain. (Patient not taking: Reported on 12/12/2020) 60 tablet 0   promethazine (PHENERGAN) 25 MG tablet Take 25 mg by mouth every 6 (six) hours as needed. (Patient not taking: No sig reported)       Discharge Medications: Please see discharge summary for a list of discharge medications.  Relevant Imaging Results:  Relevant Lab Results:   Additional Information SSN [id number]  Verdell Carmine, RN

## 2020-12-13 NOTE — TOC Initial Note (Signed)
Transition of Care [facility]) - Initial/Assessment Note    Patient Details  Name: Eddie Fletcher MRN: [id number] Date of Birth: August 18, 1955  [facility] CM/SW Contact:    Verdell Carmine, RN Phone Number: 12/13/2020, 3:58 PM  Clinical Narrative:                  Patient in for back pain, has been to several ED without relief with treatment. PT assessed pateint in ED and recommended SNF for rehab.Spoke with patient and he agrees to SNF.  Fl2 information done and sent to MD for co-sign.   Expected Discharge Plan: Skilled Nursing Facility Barriers to Discharge: ED SNF auth   Patient Goals and CMS Choice        Expected Discharge Plan and Services Expected Discharge Plan: [place] arrangements for the past 2 months: Single Family Home                                      Prior Living Arrangements/Services Living arrangements for the past 2 months: Single Family Home Lives with:: Spouse Patient language and need for interpreter reviewed:: Yes        Need for Family Participation in Patient Care: Yes (Comment) Care giver support system in place?: Yes (comment)   Criminal Activity/Legal Involvement Pertinent to Current Situation/Hospitalization: No - Comment as needed  Activities of Daily Living      Permission Sought/Granted                  Emotional Assessment   Attitude/Demeanor/Rapport: Gracious Affect (typically observed): Stable Orientation: : Oriented to Self, Oriented to Place, Oriented to  Time, Oriented to Situation Alcohol / Substance Use: Not Applicable Psych Involvement: No (comment)  Admission diagnosis:  EMS Patient Active Problem List   Diagnosis Date Noted   Chronic low back pain with left-sided sciatica 12/12/2020   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   Sepsis due to Staphylococcus aureus ([place]) 01/09/2015   Elevated LFTs    Acute renal failure ([place])    AKI (acute kidney  injury) ([place]) 01/08/2015   Hypotension 01/08/2015   Septic joint of right knee joint ([place]) 01/08/2015   Septic joint of left knee joint ([place]) 01/03/2015   Besnier-Boeck disease 02/05/2014   Apnea, sleep 02/05/2014   Avitaminosis D 02/05/2014   BP (high blood pressure) 02/05/2014   Colon polyp 02/05/2014   Osteoarthritis of right knee 02/05/2014   Total knee replacement status 01/30/2014   PCP:  Curly Rim, MD Pharmacy:   [id number] [place], [address] [address]Phone: [phone]: [phone]    Social Determinants of Health (SDOH) Interventions    Readmission Risk Interventions No flowsheet data found.

## 2020-12-14 MED ORDER — FOLIC ACID 1 MG PO TABS
1.0000 mg | ORAL_TABLET | Freq: Every day | ORAL | Status: DC
  Administered 2020-12-14 – 2020-12-15 (×2): 1 mg via ORAL
  Filled 2020-12-14 (×2): qty 1

## 2020-12-14 MED ORDER — VITAMIN D 25 MCG (1000 UNIT) PO TABS
1000.0000 [IU] | ORAL_TABLET | Freq: Every day | ORAL | Status: DC
  Administered 2020-12-14 – 2020-12-15 (×2): 1000 [IU] via ORAL
  Filled 2020-12-14 (×2): qty 1

## 2020-12-14 MED ORDER — DOXYCYCLINE HYCLATE 100 MG PO TABS
100.0000 mg | ORAL_TABLET | Freq: Two times a day (BID) | ORAL | Status: DC
  Administered 2020-12-14 – 2020-12-15 (×3): 100 mg via ORAL
  Filled 2020-12-14 (×3): qty 1

## 2020-12-14 MED ORDER — CYCLOBENZAPRINE HCL 10 MG PO TABS
10.0000 mg | ORAL_TABLET | Freq: Three times a day (TID) | ORAL | Status: DC | PRN
  Administered 2020-12-14 – 2020-12-15 (×2): 10 mg via ORAL
  Filled 2020-12-14 (×2): qty 1

## 2020-12-14 MED ORDER — ALBUTEROL SULFATE HFA 108 (90 BASE) MCG/ACT IN AERS: 1.0000 | INHALATION_SPRAY | Freq: Four times a day (QID) | RESPIRATORY_TRACT | Status: DC | PRN

## 2020-12-14 MED ORDER — FERROUS GLUCONATE 324 (38 FE) MG PO TABS: 324.0000 mg | ORAL_TABLET | Freq: Every day | ORAL | Status: DC

## 2020-12-14 MED ORDER — PANTOPRAZOLE SODIUM 40 MG PO TBEC
40.0000 mg | DELAYED_RELEASE_TABLET | Freq: Every day | ORAL | Status: DC
  Administered 2020-12-14 – 2020-12-15 (×2): 40 mg via ORAL
  Filled 2020-12-14 (×2): qty 1

## 2020-12-14 MED ORDER — GABAPENTIN 100 MG PO CAPS
100.0000 mg | ORAL_CAPSULE | Freq: Two times a day (BID) | ORAL | Status: DC
  Administered 2020-12-14 – 2020-12-15 (×3): 100 mg via ORAL
  Filled 2020-12-14 (×3): qty 1

## 2020-12-14 MED ORDER — DOCUSATE SODIUM 100 MG PO CAPS
100.0000 mg | ORAL_CAPSULE | Freq: Two times a day (BID) | ORAL | Status: DC
  Administered 2020-12-14 – 2020-12-15 (×3): 100 mg via ORAL
  Filled 2020-12-14 (×3): qty 1

## 2020-12-14 MED ORDER — TAMSULOSIN HCL 0.4 MG PO CAPS
0.4000 mg | ORAL_CAPSULE | Freq: Every day | ORAL | Status: DC
  Administered 2020-12-14: 0.4 mg via ORAL
  Filled 2020-12-14: qty 1

## 2020-12-14 MED ORDER — HYDROCODONE-ACETAMINOPHEN 10-325 MG PO TABS
1.0000 | ORAL_TABLET | ORAL | Status: DC | PRN
  Administered 2020-12-14 – 2020-12-15 (×4): 1 via ORAL
  Filled 2020-12-14 (×4): qty 1

## 2020-12-14 MED ORDER — FERROUS GLUCONATE 324 (38 FE) MG PO TABS
324.0000 mg | ORAL_TABLET | Freq: Every day | ORAL | Status: DC
  Administered 2020-12-14: 324 mg via ORAL
  Filled 2020-12-14: qty 1

## 2020-12-14 NOTE — ED Notes (Signed)
Pt soiled bed, bed linens changed, peri care performed, condom cath placed on pt due to incontinence. Bed locked in lowest position, call light within reach.

## 2020-12-15 NOTE — ED Notes (Signed)
EMS called for patient at this time. Waiting for transport.

## 2020-12-15 NOTE — Progress Notes (Addendum)
CSW reviewed pts chart. Pt has multiple denied bed offers. Likelihood of finding and facility to accept and getting insurance auth is slim for pt. CSW spoke with [facility] supervisor Beverlee Nims T who also agrees pt seems more custodial and will need placement from the community. CSW spoke with pt in ED about not having and bed offers and the need to work on this from the community as we cannot board him in the ED. Pt is understanding and states that he will go back to the [facility] in [place]. CSW set pt up with a follow up appointment with his PCP to speak about hospital visit and placement. CSW also set pt up with EMS transport to appointment. Pt appointment is on Wednesday 8/10 at 1:30pm. This has been added to pts AVS. ED staff has been updated of this information.

## 2020-12-15 NOTE — ED Notes (Signed)
Psychiatric nurse for transport to [facility] rm [address], In [place].

## 2020-12-15 NOTE — ED Notes (Signed)
Pt bathed. Condom cath placed

## 2020-12-17 ENCOUNTER — Encounter (HOSPITAL_COMMUNITY): Payer: Self-pay

## 2020-12-17 ENCOUNTER — Emergency Department (HOSPITAL_COMMUNITY): Payer: Medicare HMO

## 2020-12-17 ENCOUNTER — Inpatient Hospital Stay (HOSPITAL_COMMUNITY)
Admission: EM | Admit: 2020-12-17 | Discharge: 2020-12-28 | DRG: 917 | Disposition: A | Discharge status: Home / Self Care | Payer: Medicare HMO | Attending: Internal Medicine | Admitting: Internal Medicine

## 2020-12-17 ENCOUNTER — Other Ambulatory Visit: Payer: Self-pay

## 2020-12-17 DIAGNOSIS — M1990 Unspecified osteoarthritis, unspecified site: Secondary | ICD-10-CM | POA: Diagnosis present

## 2020-12-17 DIAGNOSIS — Z79899 Other long term (current) drug therapy: Secondary | ICD-10-CM

## 2020-12-17 DIAGNOSIS — N179 Acute kidney failure, unspecified: Secondary | ICD-10-CM | POA: Diagnosis present

## 2020-12-17 DIAGNOSIS — Z803 Family history of malignant neoplasm of breast: Secondary | ICD-10-CM | POA: Diagnosis not present

## 2020-12-17 DIAGNOSIS — G9341 Metabolic encephalopathy: Secondary | ICD-10-CM | POA: Diagnosis present

## 2020-12-17 DIAGNOSIS — Z8249 Family history of ischemic heart disease and other diseases of the circulatory system: Secondary | ICD-10-CM | POA: Diagnosis not present

## 2020-12-17 DIAGNOSIS — N390 Urinary tract infection, site not specified: Secondary | ICD-10-CM | POA: Diagnosis present

## 2020-12-17 DIAGNOSIS — Z806 Family history of leukemia: Secondary | ICD-10-CM | POA: Diagnosis not present

## 2020-12-17 DIAGNOSIS — G8929 Other chronic pain: Secondary | ICD-10-CM | POA: Diagnosis present

## 2020-12-17 DIAGNOSIS — G4730 Sleep apnea, unspecified: Secondary | ICD-10-CM | POA: Diagnosis present

## 2020-12-17 DIAGNOSIS — L299 Pruritus, unspecified: Secondary | ICD-10-CM | POA: Diagnosis not present

## 2020-12-17 DIAGNOSIS — T40601A Poisoning by unspecified narcotics, accidental (unintentional), initial encounter: Secondary | ICD-10-CM | POA: Diagnosis present

## 2020-12-17 DIAGNOSIS — J9601 Acute respiratory failure with hypoxia: Secondary | ICD-10-CM

## 2020-12-17 DIAGNOSIS — Z811 Family history of alcohol abuse and dependence: Secondary | ICD-10-CM | POA: Diagnosis not present

## 2020-12-17 DIAGNOSIS — S0990XA Unspecified injury of head, initial encounter: Secondary | ICD-10-CM

## 2020-12-17 DIAGNOSIS — E559 Vitamin D deficiency, unspecified: Secondary | ICD-10-CM | POA: Diagnosis present

## 2020-12-17 DIAGNOSIS — K219 Gastro-esophageal reflux disease without esophagitis: Secondary | ICD-10-CM | POA: Diagnosis present

## 2020-12-17 DIAGNOSIS — M549 Dorsalgia, unspecified: Secondary | ICD-10-CM | POA: Diagnosis present

## 2020-12-17 DIAGNOSIS — N184 Chronic kidney disease, stage 4 (severe): Secondary | ICD-10-CM | POA: Diagnosis present

## 2020-12-17 DIAGNOSIS — G4733 Obstructive sleep apnea (adult) (pediatric): Secondary | ICD-10-CM | POA: Diagnosis present

## 2020-12-17 DIAGNOSIS — Z8719 Personal history of other diseases of the digestive system: Secondary | ICD-10-CM

## 2020-12-17 DIAGNOSIS — N189 Chronic kidney disease, unspecified: Secondary | ICD-10-CM | POA: Diagnosis not present

## 2020-12-17 DIAGNOSIS — E860 Dehydration: Secondary | ICD-10-CM | POA: Diagnosis present

## 2020-12-17 DIAGNOSIS — Z87442 Personal history of urinary calculi: Secondary | ICD-10-CM | POA: Diagnosis not present

## 2020-12-17 DIAGNOSIS — Z96651 Presence of right artificial knee joint: Secondary | ICD-10-CM | POA: Diagnosis present

## 2020-12-17 DIAGNOSIS — I129 Hypertensive chronic kidney disease with stage 1 through stage 4 chronic kidney disease, or unspecified chronic kidney disease: Secondary | ICD-10-CM | POA: Diagnosis present

## 2020-12-17 DIAGNOSIS — Z91040 Latex allergy status: Secondary | ICD-10-CM

## 2020-12-17 DIAGNOSIS — T402X4A Poisoning by other opioids, undetermined, initial encounter: Secondary | ICD-10-CM

## 2020-12-17 DIAGNOSIS — R4182 Altered mental status, unspecified: Secondary | ICD-10-CM | POA: Diagnosis present

## 2020-12-17 DIAGNOSIS — M545 Low back pain, unspecified: Secondary | ICD-10-CM | POA: Diagnosis not present

## 2020-12-17 DIAGNOSIS — Z881 Allergy status to other antibiotic agents status: Secondary | ICD-10-CM

## 2020-12-17 DIAGNOSIS — G928 Other toxic encephalopathy: Secondary | ICD-10-CM | POA: Diagnosis present

## 2020-12-17 DIAGNOSIS — Z20822 Contact with and (suspected) exposure to covid-19: Secondary | ICD-10-CM | POA: Diagnosis present

## 2020-12-17 DIAGNOSIS — Z882 Allergy status to sulfonamides status: Secondary | ICD-10-CM

## 2020-12-17 DIAGNOSIS — R296 Repeated falls: Secondary | ICD-10-CM | POA: Diagnosis not present

## 2020-12-17 DIAGNOSIS — Z6372 Alcoholism and drug addiction in family: Secondary | ICD-10-CM

## 2020-12-17 DIAGNOSIS — Z6841 Body Mass Index (BMI) 40.0 and over, adult: Secondary | ICD-10-CM | POA: Diagnosis not present

## 2020-12-17 DIAGNOSIS — N3000 Acute cystitis without hematuria: Secondary | ICD-10-CM

## 2020-12-17 DIAGNOSIS — M4802 Spinal stenosis, cervical region: Secondary | ICD-10-CM | POA: Diagnosis present

## 2020-12-17 DIAGNOSIS — Z888 Allergy status to other drugs, medicaments and biological substances status: Secondary | ICD-10-CM

## 2020-12-17 LAB — URINALYSIS, ROUTINE W REFLEX MICROSCOPIC
Bacteria, UA: NONE SEEN
Bilirubin Urine: NEGATIVE
Glucose, UA: NEGATIVE mg/dL
Ketones, ur: NEGATIVE mg/dL
Nitrite: POSITIVE — AB
Protein, ur: NEGATIVE mg/dL
Specific Gravity, Urine: 1.012 (ref 1.005–1.030)
WBC, UA: >50 WBC/hpf — ABNORMAL HIGH (ref 0–5)
pH: 5 (ref 5.0–8.0)

## 2020-12-17 LAB — CBC WITH DIFFERENTIAL/PLATELET
Abs Immature Granulocytes: 0.11 10*3/uL — ABNORMAL HIGH (ref 0.00–0.07)
Basophils Absolute: 0 10*3/uL (ref 0.0–0.1)
Basophils Relative: 0 %
Eosinophils Absolute: 0.1 10*3/uL (ref 0.0–0.5)
Eosinophils Relative: 1 %
HCT: 34.8 % — ABNORMAL LOW (ref 39.0–52.0)
Hemoglobin: 11.1 g/dL — ABNORMAL LOW (ref 13.0–17.0)
Immature Granulocytes: 1 %
Lymphocytes Relative: 6 %
Lymphs Abs: 0.6 10*3/uL — ABNORMAL LOW (ref 0.7–4.0)
MCH: 29.8 pg (ref 26.0–34.0)
MCHC: 31.9 g/dL (ref 30.0–36.0)
MCV: 93.5 fL (ref 80.0–100.0)
Monocytes Absolute: 0.9 10*3/uL (ref 0.1–1.0)
Monocytes Relative: 9 %
Neutro Abs: 8.5 10*3/uL — ABNORMAL HIGH (ref 1.7–7.7)
Neutrophils Relative %: 83 %
Platelets: 109 10*3/uL — ABNORMAL LOW (ref 150–400)
RBC: 3.72 MIL/uL — ABNORMAL LOW (ref 4.22–5.81)
RDW: 16.4 % — ABNORMAL HIGH (ref 11.5–15.5)
WBC: 10.2 10*3/uL (ref 4.0–10.5)
nRBC: 0 % (ref 0.0–0.2)

## 2020-12-17 LAB — COMPREHENSIVE METABOLIC PANEL
ALT: 24 U/L (ref 0–44)
AST: 31 U/L (ref 15–41)
Albumin: 2.5 g/dL — ABNORMAL LOW (ref 3.5–5.0)
Alkaline Phosphatase: 113 U/L (ref 38–126)
Anion gap: 9 (ref 5–15)
BUN: 58 mg/dL — ABNORMAL HIGH (ref 8–23)
CO2: 23 mmol/L (ref 22–32)
Calcium: 8.4 mg/dL — ABNORMAL LOW (ref 8.9–10.3)
Chloride: 107 mmol/L (ref 98–111)
Creatinine, Ser: 3.4 mg/dL — ABNORMAL HIGH (ref 0.61–1.24)
GFR, Estimated: 19 mL/min — ABNORMAL LOW (ref >60)
Glucose, Bld: 89 mg/dL (ref 70–99)
Potassium: 4.5 mmol/L (ref 3.5–5.1)
Sodium: 139 mmol/L (ref 135–145)
Total Bilirubin: 1.1 mg/dL (ref 0.3–1.2)
Total Protein: 7.3 g/dL (ref 6.5–8.1)

## 2020-12-17 LAB — BLOOD GAS, ARTERIAL
Acid-base deficit: 2.2 mmol/L — ABNORMAL HIGH (ref 0.0–2.0)
Bicarbonate: 22 mmol/L (ref 20.0–28.0)
FIO2: 28
O2 Saturation: 98 %
Patient temperature: 36.1
pCO2 arterial: 50.3 mmHg — ABNORMAL HIGH (ref 32.0–48.0)
pH, Arterial: 7.288 — ABNORMAL LOW (ref 7.350–7.450)
pO2, Arterial: 102 mmHg (ref 83.0–108.0)

## 2020-12-17 LAB — RAPID URINE DRUG SCREEN, HOSP PERFORMED
Amphetamines: NOT DETECTED
Barbiturates: NOT DETECTED
Benzodiazepines: NOT DETECTED
Cocaine: NOT DETECTED
Opiates: POSITIVE — AB
Tetrahydrocannabinol: NOT DETECTED

## 2020-12-17 LAB — ETHANOL: Alcohol, Ethyl (B): <10 mg/dL (ref <10)

## 2020-12-17 LAB — RESP PANEL BY RT-PCR (FLU A&B, COVID) ARPGX2
Influenza A by PCR: NEGATIVE
Influenza B by PCR: NEGATIVE
SARS Coronavirus 2 by RT PCR: NEGATIVE

## 2020-12-17 LAB — BRAIN NATRIURETIC PEPTIDE: B Natriuretic Peptide: 1204 pg/mL — ABNORMAL HIGH (ref 0.0–100.0)

## 2020-12-17 LAB — LACTIC ACID, PLASMA
Lactic Acid, Venous: 2 mmol/L (ref 0.5–1.9)
Lactic Acid, Venous: 2.1 mmol/L (ref 0.5–1.9)

## 2020-12-17 LAB — ACETAMINOPHEN LEVEL: Acetaminophen (Tylenol), Serum: <10 ug/mL — ABNORMAL LOW (ref 10–30)

## 2020-12-17 LAB — GLUCOSE, CAPILLARY: Glucose-Capillary: 91 mg/dL (ref 70–99)

## 2020-12-17 MED ORDER — SODIUM CHLORIDE 0.9 % IV SOLN
2.0000 g | INTRAVENOUS | Status: DC
  Administered 2020-12-18 – 2020-12-19 (×2): 2 g via INTRAVENOUS
  Filled 2020-12-17 (×2): qty 20

## 2020-12-17 MED ORDER — ONDANSETRON HCL 4 MG PO TABS: 4.0000 mg | ORAL_TABLET | Freq: Four times a day (QID) | ORAL | Status: DC | PRN

## 2020-12-17 MED ORDER — ENOXAPARIN SODIUM 40 MG/0.4ML IJ SOSY
40.0000 mg | PREFILLED_SYRINGE | INTRAMUSCULAR | Status: DC
  Administered 2020-12-17: 40 mg via SUBCUTANEOUS
  Filled 2020-12-17: qty 0.4

## 2020-12-17 MED ORDER — ONDANSETRON HCL 4 MG/2ML IJ SOLN: 4.0000 mg | Freq: Four times a day (QID) | INTRAMUSCULAR | Status: DC | PRN

## 2020-12-17 MED ORDER — SODIUM CHLORIDE 0.9 % IV SOLN: INTRAVENOUS | Status: AC

## 2020-12-17 MED ORDER — POLYETHYLENE GLYCOL 3350 17 G PO PACK
17.0000 g | PACK | Freq: Every day | ORAL | Status: DC | PRN
  Administered 2020-12-24: 17 g via ORAL
  Filled 2020-12-17: qty 1

## 2020-12-17 MED ORDER — TAMSULOSIN HCL 0.4 MG PO CAPS
0.4000 mg | ORAL_CAPSULE | Freq: Every day | ORAL | Status: DC
  Administered 2020-12-17 – 2020-12-27 (×11): 0.4 mg via ORAL
  Filled 2020-12-17 (×11): qty 1

## 2020-12-17 MED ORDER — SODIUM CHLORIDE 0.9 % IV SOLN
1.0000 g | Freq: Once | INTRAVENOUS | Status: AC
  Administered 2020-12-17: 1 g via INTRAVENOUS
  Filled 2020-12-17: qty 10

## 2020-12-17 MED ORDER — ACETAMINOPHEN 650 MG RE SUPP: 650.0000 mg | Freq: Four times a day (QID) | RECTAL | Status: DC | PRN

## 2020-12-17 MED ORDER — ACETAMINOPHEN 325 MG PO TABS
650.0000 mg | ORAL_TABLET | Freq: Four times a day (QID) | ORAL | Status: DC | PRN
  Administered 2020-12-18 – 2020-12-20 (×4): 650 mg via ORAL
  Filled 2020-12-17 (×4): qty 2

## 2020-12-17 MED ORDER — FLUTICASONE PROPIONATE 50 MCG/ACT NA SUSP
1.0000 | Freq: Every day | NASAL | Status: DC
  Administered 2020-12-17 – 2020-12-28 (×12): 1 via NASAL
  Filled 2020-12-17 (×3): qty 16

## 2020-12-17 NOTE — ED Notes (Signed)
Provider notified of pt in room

## 2020-12-17 NOTE — ED Notes (Signed)
Patient transported to CT 

## 2020-12-17 NOTE — Progress Notes (Signed)
Patient stated he does not wear CPAP at home and does not want one here while in hospital.

## 2020-12-17 NOTE — ED Notes (Signed)
Pts wife at bed provider at bedside.

## 2020-12-17 NOTE — ED Triage Notes (Signed)
Pt brought to ED via [facility] for AMS per wife. Pt was staying at the [facility]. Pt had fallen earlier and hit his head on the nightstand. Pt has been seen at multiple ER recently. Pt pupils were pinpoint, Narcan 4 mg given with + response.

## 2020-12-17 NOTE — ED Provider Notes (Signed)
[facility] EMERGENCY DEPARTMENT Provider Note   CSN: [id number] Arrival date & time: 12/17/20  1335     History Chief Complaint  Patient presents with   Altered Mental Status    Eddie Fletcher is a 65 y.o. male.  Patient brought in by EMS.  When EMS arrived patient was unresponsive.  EMS reported that pupils were pinpoint he was given Narcan 4 mg and he woke up.  But still seemed confused.  Apparently patient had fallen earlier and hit his head on the nightstand.  Patient has been seen multiple times in the emergency department.  Most recently on August 5.  This is normally been for back pain complaints.  Patient was given a prescription for oxycodone 10 mg - 325 mg.  He received 20 tablets.  This was on August 2.  Patient is morbidly obese.  Past medical history sniffing for chronic back pain and sciatica on the left side.  Acute kidney injury.  Hypertension.  Edema of lower extremities.  Cardiomegaly.  Shortness of breath, and sleep apnea.      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/12/2020   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   Sepsis due to Staphylococcus aureus ([place]) 01/09/2015   Elevated LFTs    Acute renal failure ([place])    AKI (acute kidney injury) ([place]) 01/08/2015   Hypotension 01/08/2015   Septic joint of right knee joint ([place]) 01/08/2015   Septic  joint of left knee joint ([place]) 01/03/2015   Besnier-Boeck disease 02/05/2014   Apnea, sleep 02/05/2014   Avitaminosis D 02/05/2014   BP (high blood pressure) 02/05/2014   Colon polyp 02/05/2014   Osteoarthritis of right knee 02/05/2014   Total knee replacement status 01/30/2014    Past Surgical History:  Procedure Laterality Date   APPLICATION OF WOUND VAC Right 01/03/2015   Procedure: APPLICATION OF INCISIONAL  WOUND VAC;  Surgeon: Newt Minion, MD;  Location: [place];  Service: Orthopedics;  Laterality: Right;   CARPAL TUNNEL RELEASE Bilateral 04   COLONOSCOPY W/ BIOPSIES AND POLYPECTOMY     KNEE ARTHROSCOPY Right 12   TOTAL KNEE ARTHROPLASTY Right 01/30/2014   Procedure: Right Total Knee Arthroplasty;  Surgeon: Newt Minion, MD;  Location: [place];  Service: Orthopedics;  Laterality: Right;   TOTAL KNEE REVISION Right 01/03/2015   Procedure: Revision Right Total Knee Arthroplasty, Poly Exchange, Place Antibiotic Beads;  Surgeon: Newt Minion, MD;  Location: [place];  Service: Orthopedics;  Laterality: Right;   TRIGGER FINGER RELEASE Right 12   rt thumb       Family History  Problem Relation Age of Onset  Peripheral vascular disease Father    Alcoholism Mother    Hypertension Maternal Grandfather    Leukemia Maternal Aunt    Breast cancer Maternal Aunt     Social History   Tobacco Use   Smoking status: Never   Smokeless tobacco: Never  Substance Use Topics   Alcohol use: No    Comment: quit 15 yrs   Drug use: No    Home Medications Prior to Admission medications   Medication Sig Start Date End Date Taking? Authorizing Provider  acetaminophen (TYLENOL) 500 MG tablet Take 1,000 mg by mouth every 6 (six) hours as needed for moderate pain.    [provider]  albuterol (PROVENTIL HFA;VENTOLIN HFA) 108 (90 BASE) MCG/ACT inhaler Inhale 1-2 puffs into the lungs every 6 (six) hours as needed for wheezing or shortness of breath.    [provider]   cholecalciferol (VITAMIN D) 1000 UNITS tablet Take 1,000 Units by mouth daily.    [provider]  Cholecalciferol 50 MCG (2000 UT) TABS Take by mouth. Patient not taking: No sig reported 01/25/18   [provider]  clotrimazole (LOTRIMIN) 1 % cream Apply topically. 02/27/20 02/26/21  [provider]  cyclobenzaprine (FLEXERIL) 10 MG tablet Take 10 mg by mouth 3 (three) times daily as needed for muscle spasms. Patient not taking: Reported on 12/12/2020    [provider]  doxycycline (VIBRA-TABS) 100 MG tablet Take 100 mg by mouth 2 (two) times daily. 10/15/20 10/15/21  [provider]  ferrous gluconate (FERGON) 324 MG tablet Take by mouth. 11/12/19   [provider]  fluticasone (FLONASE) 50 MCG/ACT nasal spray Place 1 spray into both nostrils daily.    [provider]  folic acid (FOLVITE) 1 MG tablet Take by mouth. 11/21/17   [provider]  gabapentin (NEURONTIN) 100 MG capsule Take 100 mg by mouth 2 (two) times daily. 12/06/20   [provider]  HYDROcodone-acetaminophen [facility]) 10-325 MG tablet Take by mouth. 12/04/20   [provider]  omeprazole (PRILOSEC) 20 MG capsule Take 20 mg by mouth daily. Patient not taking: Reported on 12/12/2020    [provider]  oxyCODONE-acetaminophen (ROXICET) 5-325 MG per tablet Take 1 tablet by mouth every 4 (four) hours as needed for severe pain. Patient not taking: Reported on 12/12/2020 01/06/15   Newt Minion, MD  tamsulosin (FLOMAX) 0.4 MG CAPS capsule Take 0.4 mg by mouth at bedtime.    [provider]    Allergies    Latex, Levaquin [levofloxacin in d5w], Keflex [cephalexin], Lisinopril, and Sulfa antibiotics  Review of Systems   Review of Systems  Unable to perform ROS: Mental status change   Physical Exam Updated Vital Signs BP (!) 109/50   Pulse (!) 109   Temp 98.1 F (36.7 C) (Oral)   Resp (!) 22   Ht 1.702 m ('5\' 7"'$ )   Wt (!) 182 kg    SpO2 93%   BMI 62.84 kg/m   Physical Exam Vitals and nursing note reviewed.  Constitutional:      General: He is in acute distress.     Appearance: He is well-developed. He is obese.  HENT:     Head: Normocephalic and atraumatic.  Eyes:     Extraocular Movements: Extraocular movements intact.     Conjunctiva/sclera: Conjunctivae normal.     Pupils: Pupils are equal, round, and reactive to light.  Cardiovascular:     Rate and Rhythm: Normal rate and regular rhythm.  Heart sounds: No murmur heard. Pulmonary:     Effort: Pulmonary effort is normal. No respiratory distress.     Breath sounds: Normal breath sounds.  Abdominal:     Palpations: Abdomen is soft.     Tenderness: There is no abdominal tenderness.     Comments: But very obese  Musculoskeletal:     Cervical back: Normal range of motion and neck supple.  Skin:    General: Skin is warm and dry.  Neurological:     Mental Status: He is alert.     Comments: Patient awake complaining of low back pain.  Wanting to sit up.  Otherwise appears to be confused.  Moving all 4 extremities no obvious focal deficit.    ED Results / Procedures / Treatments   Labs (all labs ordered are listed, but only abnormal results are displayed) Labs Reviewed  CULTURE, BLOOD (ROUTINE X 2)  CULTURE, BLOOD (ROUTINE X 2)  URINALYSIS, ROUTINE W REFLEX MICROSCOPIC  COMPREHENSIVE METABOLIC PANEL  CBC WITH DIFFERENTIAL/PLATELET  LACTIC ACID, PLASMA  RAPID URINE DRUG SCREEN, HOSP PERFORMED  ETHANOL  ACETAMINOPHEN LEVEL    EKG None  Radiology No results found.  Procedures Procedures   Medications Ordered in ED Medications - No data to display  ED Course  I have reviewed the triage vital signs and the nursing notes.  Pertinent labs & imaging results that were available during my care of the patient were reviewed by me and considered in my medical decision making (see chart for details).    MDM Rules/Calculators/A&P                           CRITICAL CARE Performed by: Fredia Sorrow Total critical care time: 35 minutes Critical care time was exclusive of separately billable procedures and treating other patients. Critical care was necessary to treat or prevent imminent or life-threatening deterioration. Critical care was time spent personally by me on the following activities: development of treatment plan with patient and/or surrogate as well as nursing, discussions with consultants, evaluation of patient's response to treatment, examination of patient, obtaining history from patient or surrogate, ordering and performing treatments and interventions, ordering and review of laboratory studies, ordering and review of radiographic studies, pulse oximetry and re-evaluation of patient's condition.  Patient arrived with immediate concern that he was unconscious when EMS got there with pinpoint pupils.  Woke up with 4 mg of Narcan suggestive of opiate overdose.  Patient was prescribed oxycodone on April 2 20 tablets.  The other immediate concern is his report that he fell and hit his head on the nightstand.  So there could be head injury.  Patient now is a awake but not completely oriented complaining of back pain wanting to sit up.  This would be chronic back pain for him.  Will obtain CT head CT neck due to the fall.  And due to the altered mental status.  We will get urinalysis urine drug screen alcohol Tylenol level.  We will check CBC and a complete metabolic panel.  We will continue to monitor patient.  Patient here tachycardic but not febrile.  At this point and not concerned about sepsis.  But something to keep in mind as his work-up develops.  Final Clinical Impression(s) / ED Diagnoses Final diagnoses:  Altered mental status, unspecified altered mental status type  Injury of head, initial encounter  Opioid overdose, undetermined intent, initial encounter ([place])  Chronic low back pain, unspecified back  pain  laterality, unspecified whether sciatica present    Rx / DC Orders ED Discharge Orders     None        Fredia Sorrow, MD 12/17/20 1501

## 2020-12-17 NOTE — ED Notes (Signed)
Upon arrival pt complaining of needing to use bed pan, pt had a VERY Large formed stool.  Pt said he had not had a BM in 5 days.  Pt alert and oriented, pt noted to have redness with excoriation to sacral area and buttocks.  Pt unkepted and odiferous.  Pt cleansed and absorbant pads placed under pt.

## 2020-12-17 NOTE — H&P (Addendum)
History and Physical    Eddie Fletcher [id number] DOB: Jun 11, 1955 DOA: 12/17/2020  PCP: Curly Rim, MD   Patient coming from: Home  I have personally briefly reviewed patient's old medical records in [place]  Chief Complaint: AMS  HPI: Eddie Fletcher is a 65 y.o. male with medical history significant for HTN, morbid obesity, sleep apnea. Patient was brought to the ED via EMS reports of altered mental status.  At the time of my evaluation, spouse at bedside, patient is sleeping but easily arousable, answers questions appropriately, but easily drifts back to sleep.  He denies pain.  No difficulty breathing.  No cough.  No chest pain.  No headaches.  No dysuria.  He denies taking any pain medications today.   Patient lives in [place], but he has been residing at [facility] since he was discharged from [facility] ED 8/8-after boarding here for 3 days with plans for SNF placement which was unsuccessful because of multiple denied bed offers.  Spouse reports that patient is unable to move around in the house. (Per notes- Hoarding) Patient had an appointment scheduled with his primary care provider for today, his transportation arrived, and he was found to be unresponsive, and they called EMS.  Patient spouse reports multiple falls.  He fell this morning at about 8 AM and hit his head on the nightstand.  She believes he was confused/altered at that time.  She is unaware of how many pain medications patient took today, but says he took it last night.  Patient has barely ambulated since he was discharged from the ER, at baseline he uses a wheelchair Rollator and walker.  Spouse reports patient has at least 2 bottles of pain medications currently.   Patient drove to [place] last week for his father's funeral.  Patient has had 8 ER visits in different/multiple facilities since 28 July-  From [place] to here in [place], complaining of back pain.  Please see detailed hospitalist  consult note from 12/12/2020.  On EMS arrival, patient's pupils were found to be pinpoint, 4 mg of Narcan was given and patient woke up, she was still confused.  ED Course: Temperature 98.1.  Heart rate 71-109.  O2 sats 88% on room air while sleeping.  Otherwise greater than 93%.  Creatinine 3.4.  Chest x-ray shows cardiomegaly vascular congestion and mild pulmonary interstitial edema, retro cardiac opacity-atelectasis or pneumonia. Head and cervical CT without acute abnormality.  Lactic acid 2.  WBC 10.2.  ABG showed pH of 7.2, PCO2 50, PO2 102. IV ceftriaxone given. With persistent alteration in mental status, admission was requested.  Review of Systems: As per HPI all other systems reviewed and negative.  Past Medical History:  Diagnosis Date   Arthritis    Cardiomegaly    Cellulitis    Chest discomfort    15 years ago; had negative testing and told it was related to stress   Colon polyps    Complication of anesthesia    blood pressure - low post op   Diaphoresis    Edema    Leg   GERD (gastroesophageal reflux disease)    Hemorrhoids    History of kidney stones    Hypertension    Obesity    Seasonal allergies    Sleep apnea    cpap 3 yrs-not used for 3-4 months   SOB (shortness of breath)    occ with exersion   Wears glasses     Past Surgical History:  Procedure  Laterality Date   APPLICATION OF WOUND VAC Right 01/03/2015   Procedure: APPLICATION OF INCISIONAL  WOUND VAC;  Surgeon: Newt Minion, MD;  Location: [facility];  Service: Orthopedics;  Laterality: Right;   CARPAL TUNNEL RELEASE Bilateral 04   COLONOSCOPY W/ BIOPSIES AND POLYPECTOMY     KNEE ARTHROSCOPY Right 12   TOTAL KNEE ARTHROPLASTY Right 01/30/2014   Procedure: Right Total Knee Arthroplasty;  Surgeon: Newt Minion, MD;  Location: [facility];  Service: Orthopedics;  Laterality: Right;   TOTAL KNEE REVISION Right 01/03/2015   Procedure: Revision Right Total Knee Arthroplasty, Poly Exchange, Place Antibiotic Beads;   Surgeon: Newt Minion, MD;  Location: [facility];  Service: Orthopedics;  Laterality: Right;   TRIGGER FINGER RELEASE Right 12   rt thumb     reports that he has never smoked. He has never used smokeless tobacco. He reports that he does not drink alcohol and does not use drugs.  Allergies  Allergen Reactions   Latex Other (See Comments)    Latex cath caused burning and irritation Latex cath caused burning and irritation    Levaquin [Levofloxacin In D5w] Other (See Comments)    Muscle aches   Keflex [Cephalexin] Rash   Lisinopril Cough   Sulfa Antibiotics Rash    Family History  Problem Relation Age of Onset   Peripheral vascular disease Father    Alcoholism Mother    Hypertension Maternal Grandfather    Leukemia Maternal Aunt    Breast cancer Maternal Aunt    Prior to Admission medications   Medication Sig Start Date End Date Taking? Authorizing Provider  acetaminophen (TYLENOL) 500 MG tablet Take 1,000 mg by mouth every 6 (six) hours as needed for moderate pain.    [provider]  albuterol (PROVENTIL HFA;VENTOLIN HFA) 108 (90 BASE) MCG/ACT inhaler Inhale 1-2 puffs into the lungs every 6 (six) hours as needed for wheezing or shortness of breath.    [provider]  cholecalciferol (VITAMIN D) 1000 UNITS tablet Take 1,000 Units by mouth daily.    [provider]  Cholecalciferol 50 MCG (2000 UT) TABS Take by mouth. Patient not taking: No sig reported 01/25/18   [provider]  clotrimazole (LOTRIMIN) 1 % cream Apply topically. 02/27/20 02/26/21  [provider]  cyclobenzaprine (FLEXERIL) 10 MG tablet Take 10 mg by mouth 3 (three) times daily as needed for muscle spasms. Patient not taking: Reported on 12/12/2020    [provider]  doxycycline (VIBRA-TABS) 100 MG tablet Take 100 mg by mouth 2 (two) times daily. 10/15/20 10/15/21  [provider]  ferrous gluconate (FERGON) 324 MG tablet Take by mouth. 11/12/19   [provider]  fluticasone (FLONASE) 50 MCG/ACT nasal spray Place 1 spray into both nostrils daily.    [provider]  folic acid (FOLVITE) 1 MG tablet Take by mouth. 11/21/17   [provider]  gabapentin (NEURONTIN) 100 MG capsule Take 100 mg by mouth 2 (two) times daily. 12/06/20   [provider]  HYDROcodone-acetaminophen [facility]) 10-325 MG tablet Take by mouth. 12/04/20   [provider]  omeprazole (PRILOSEC) 20 MG capsule Take 20 mg by mouth daily. Patient not taking: Reported on 12/12/2020    [provider]  oxyCODONE-acetaminophen (ROXICET) 5-325 MG per tablet Take 1 tablet by mouth every 4 (four) hours as needed for severe pain. Patient not taking: Reported on 12/12/2020 01/06/15   Newt Minion, MD  tamsulosin [facility]) 0.4 MG CAPS capsule  Take 0.4 mg by mouth at bedtime.    [provider]    Physical Exam: Vitals:   12/17/20 1545 12/17/20 1600 12/17/20 1630 12/17/20 1736  BP: 112/60 (!) 144/49 (!) 144/50 (!) 125/59  Pulse: (!) 104 90 88 96  Resp: '19 18 18 18  '$ Temp:      TempSrc:      SpO2: 93% (!) 89% 99% 100%  Weight:      Height:        Constitutional: Morbidly obese, sleeping but easily arousable to verbal stimuli but drifts back to sleep,  comfortable.  Appears unkempt with mild foul odor. Vitals:   12/17/20 1545 12/17/20 1600 12/17/20 1630 12/17/20 1736  BP: 112/60 (!) 144/49 (!) 144/50 (!) 125/59  Pulse: (!) 104 90 88 96  Resp: '19 18 18 18  '$ Temp:      TempSrc:      SpO2: 93% (!) 89% 99% 100%  Weight:      Height:       Eyes: PERRL, lids and conjunctivae normal ENMT: Mucous membranes are dry. Neck: normal, supple, no masses, no thyromegaly Respiratory: Normal respiratory effort. No accessory muscle use.  Cardiovascular: Regular rate and rhythm, no murmurs / rubs / gallops. No extremity edema-but chronic stasis changes bilaterally. 2+ pedal pulses.  Abdomen: no tenderness, no masses palpated. No  hepatosplenomegaly. Bowel sounds positive.  Musculoskeletal: no clubbing / cyanosis. No joint deformity upper and lower extremities. Good ROM, no contractures. Normal muscle tone.  Skin: no rashes, lesions, ulcers. No induration Neurologic:  Morbidly obese, sleeping but easily arousable to verbal stimuli but drifts back to sleep, able to move all extremities, no apparent cranial nerve abnormality. Psychiatric: Lethargic..  But able to answer questions appropriately with prompting  Labs on Admission: I have personally reviewed following labs and imaging studies  CBC: Recent Labs  Lab 12/12/20 1353 12/17/20 1529  WBC 14.7* 10.2  NEUTROABS 11.9* 8.5*  HGB 11.5* 11.1*  HCT 36.7* 34.8*  MCV 93.9 93.5  PLT 138* 0000000*   Basic Metabolic Panel: Recent Labs  Lab 12/12/20 1353 12/17/20 1529  NA 135 139  K 3.9 4.5  CL 106 107  CO2 24 23  GLUCOSE 126* 89  BUN 44* 58*  CREATININE 3.05* 3.40*  CALCIUM 7.6* 8.4*   Liver Function Tests: Recent Labs  Lab 12/17/20 1529  AST 31  ALT 24  ALKPHOS 113  BILITOT 1.1  PROT 7.3  ALBUMIN 2.5*   Urine analysis:    Component Value Date/Time   COLORURINE YELLOW 12/17/2020 1547   APPEARANCEUR HAZY (A) 12/17/2020 1547   LABSPEC 1.012 12/17/2020 1547   PHURINE 5.0 12/17/2020 1547   GLUCOSEU NEGATIVE 12/17/2020 1547   HGBUR MODERATE (A) 12/17/2020 1547   BILIRUBINUR NEGATIVE 12/17/2020 1547   KETONESUR NEGATIVE 12/17/2020 1547   PROTEINUR NEGATIVE 12/17/2020 1547   UROBILINOGEN 0.2 01/08/2015 2044   NITRITE POSITIVE (A) 12/17/2020 1547   LEUKOCYTESUR LARGE (A) 12/17/2020 1547    Radiological Exams on Admission: CT Head Wo Contrast  Result Date: 12/17/2020 CLINICAL DATA:  Altered mental status EXAM: CT HEAD WITHOUT CONTRAST TECHNIQUE: Contiguous axial images were obtained from the base of the skull through the vertex without intravenous contrast. COMPARISON:  CT head 12/03/2003 FINDINGS: Brain: There is no acute intracranial hemorrhage,  extra-axial fluid collection, or infarct. No mass lesion is identified. The ventricles are not enlarged. There is no midline shift. Vascular: No hyperdense vessel or unexpected calcification. Skull: Normal. Negative for fracture or focal  lesion. Sinuses/Orbits: There is minimal mucosal thickening in the paranasal sinuses. Bilateral lens implants are noted. The orbits and globes are otherwise unremarkable. Other: There is trace fluid in the left mastoid air cells. Fluid is also seen in the left middle ear cavity. IMPRESSION: 1. No acute intracranial pathology. 2. Small left mastoid and middle ear effusions. Correlate with any symptoms. Electronically Signed   By: Valetta Mole MD   On: 12/17/2020 15:35   CT Cervical Spine Wo Contrast  Result Date: 12/17/2020 CLINICAL DATA:  Poly trauma EXAM: CT CERVICAL SPINE WITHOUT CONTRAST TECHNIQUE: Multidetector CT imaging of the cervical spine was performed without intravenous contrast. Multiplanar CT image reconstructions were also generated. COMPARISON:  Cervical spine radiographs 11/30/2003 FINDINGS: Alignment: There is straightening of the normal cervical spine lordosis. There is no anterior or retrolisthesis. Skull base and vertebrae: Vertebral body heights are preserved. There is no evidence of acute fracture. There is no suspicious osseous lesion. Soft tissues and spinal canal: No prevertebral fluid or swelling. No visible canal hematoma. Disc levels: There is intervertebral disc space narrowing with associated uncovertebral arthropathy at C5-C6 and C6-C7. The osseous spinal canal is patent. There is moderate to severe right neural foraminal stenosis at C5-C6. Upper chest: The lung apices are not imaged. Other: None. IMPRESSION: 1. No acute fracture or traumatic malalignment of the cervical spine. 2. Degenerative changes most advanced at C5-C6 where there is moderate to severe right neural foraminal stenosis. Electronically Signed   By: Valetta Mole MD   On:  12/17/2020 15:39   DG Chest Port 1 View  Result Date: 12/17/2020 CLINICAL DATA:  Altered mental status EXAM: PORTABLE CHEST 1 VIEW COMPARISON:  Chest radiograph 01/09/2015 FINDINGS: The heart is markedly enlarged. The mediastinal contours are otherwise within normal limits. There is pulmonary vascular congestion with likely mild pulmonary interstitial edema. There is probably a left pleural effusion. There is no definite right effusion. There is no pneumothorax. Retrocardiac opacity may reflect atelectasis or pneumonia. There is no acute osseous abnormality. IMPRESSION: 1. Marked cardiomegaly (progressed since 2016) with vascular congestion and probable mild pulmonary interstitial edema and a left pleural effusion. 2. Retrocardiac opacity may reflect atelectasis or pneumonia in the correct clinical setting. Electronically Signed   By: Valetta Mole MD   On: 12/17/2020 15:30    EKG: Pending.   Assessment/Plan Principal Problem:   Acute metabolic encephalopathy Active Problems:   Apnea, sleep   CKD (chronic kidney disease), stage III (HCC)  Acute metabolic encephalopathy-likely combination of narcotics and UTI.  Possibly also dehydrated.  Improvement in mental status after 4 mg of Narcan was given by EMS.  UDS positive for opiates.  Currently responsive but lethargic.  Head and cervical CT without acute abnormality.  UA suggestive of UTI.  Chest x-ray suggest congestion, with possible atelectasis or pneumonia.  COVID test negative.  ABG shows pH of 7.2, PCO2 of 50. -Continue with IV ceftriaxone 2 g daily -Follow-up blood and urine cultures. -Hold psychoactive agents at this time, gabapentin Percocets  Urinary tract infection-mild tachycardia to 109 initially, initial tachycardia to 32, lactic acid of 2.  At this time unable to tell exactly if this is early sepsis versus dehydration. -Continue IV ceftriaxone  AKI on CKD 3-4-creatinine 3.4, per Care Everywhere baseline 1.8-2.3 over the past year.  Volume status challenging to tell, clinically appears dehydrated, but chest x-ray shows marked cardiomegaly suggest vascular congestion, but patient is morbidly obese.   - Care -everywhere- more recent chest xray with cardiomegaly  and congestion. Per spouse he used to be on lasix.  -Trial of IV fluids N/s 75cc/hr x 15hrs -Reassess renal function in the morning, if hydration or rather diuresis needed - Check BNP-elevated, hydrate cautiously. - Obtain EKG - LAst Echo 03/2020 Ef 55-60%, mild hypertrophy.  Obstructive sleep apnea -used to be on CPAP has not used in a while.  O2 sats dropping to 88% when sleeping. - CPAP QHS.   Back pain-7 prior ED visits at multiple facilities since 7/28 for back pain.  CT spine without contrast -was done here at his last ED visit 8/5 was without acute osseous abnormality, suggested multilevel degenerative change with severe bilateral foraminal stenosis L5-S1. MRI recommended.  But MRI could not be done due to patient's habitus. EDP talked to neurosurgeon Dr. Venetia Constable who suggested CT myelogram for further, but this cannot be done at this facility. -Follow-up as outpatient  Multiple falls, Morbid obesity-BMI 62.8.  Patient cannot care for himself.  Unsuccessful attempt at SNF placement after boarding in ED 8/5 - 8/8.  Currently residing at a [facility]. -Consider PT evaluation prior to discharge   DVT prophylaxis: Lovenox Code Status: Full code Family Communication: Spouse at bedside Disposition Plan: ~ 2 days Consults called: None Admission status: Inpt, tele  I certify that at the point of admission it is my clinical judgment that the patient will require inpatient hospital care spanning beyond 2 midnights from the point of admission due to high intensity of service, high risk for further deterioration and high frequency of surveillance required.   Bethena Roys MD [facility]  12/17/2020, 9:56 PM

## 2020-12-17 NOTE — ED Notes (Signed)
WIFE AT BEDSIDE

## 2020-12-17 NOTE — ED Provider Notes (Signed)
Pt signed out by Dr. Rogene Houston.  Pt's labs reviewed.  He has a UTI.  He has an allergy listed to keflex; however, he had no reaction to Rocephin.  Pt's ms has waxed and waned.  He is now less responsive that he was; so an abg was done.  Ph 7.28 and pCO2 50, so bipap was ordered.   CXR:  IMPRESSION:  1. Marked cardiomegaly (progressed since 2016) with vascular  congestion and probable mild pulmonary interstitial edema and a left  pleural effusion.  2. Retrocardiac opacity may reflect atelectasis or pneumonia in the  correct clinical setting.      Pt is clinically dry and lactic is 2.0, so I did not give any lasix.  Bipap will help with mild pulmonary edema.  CT head/c-spine:  IMPRESSION:  1. No acute fracture or traumatic malalignment of the cervical  spine.  2. Degenerative changes most advanced at C5-C6 where there is  moderate to severe right neural foraminal stenosis.   IMPRESSION:  1. No acute intracranial pathology.  2. Small left mastoid and middle ear effusions. Correlate with any  symptoms.   Pt d/w Dr. Denton Brick ([facility]) for admission.  Pt's wife updated.  CRITICAL CARE Performed by: Isla Pence   Total critical care time: 30 minutes  Critical care time was exclusive of separately billable procedures and treating other patients.  Critical care was necessary to treat or prevent imminent or life-threatening deterioration.  Critical care was time spent personally by me on the following activities: development of treatment plan with patient and/or surrogate as well as nursing, discussions with consultants, evaluation of patient's response to treatment, examination of patient, obtaining history from patient or surrogate, ordering and performing treatments and interventions, ordering and review of laboratory studies, ordering and review of radiographic studies, pulse oximetry and re-evaluation of patient's condition.    Isla Pence, MD 12/17/20 1754

## 2020-12-17 NOTE — ED Notes (Signed)
Pt desats to 88% when sleeping.  Pt placed on 2LNC.  To come to 94% [facility]

## 2020-12-18 DIAGNOSIS — G9341 Metabolic encephalopathy: Secondary | ICD-10-CM | POA: Diagnosis not present

## 2020-12-18 LAB — BLOOD CULTURE ID PANEL (REFLEXED) - BCID2

## 2020-12-18 LAB — CBC
HCT: 30.9 % — ABNORMAL LOW (ref 39.0–52.0)
Hemoglobin: 9.5 g/dL — ABNORMAL LOW (ref 13.0–17.0)
MCH: 28.8 pg (ref 26.0–34.0)
MCHC: 30.7 g/dL (ref 30.0–36.0)
MCV: 93.6 fL (ref 80.0–100.0)
Platelets: 93 10*3/uL — ABNORMAL LOW (ref 150–400)
RBC: 3.3 MIL/uL — ABNORMAL LOW (ref 4.22–5.81)
RDW: 16.2 % — ABNORMAL HIGH (ref 11.5–15.5)
WBC: 7.2 10*3/uL (ref 4.0–10.5)
nRBC: 0 % (ref 0.0–0.2)

## 2020-12-18 LAB — GLUCOSE, CAPILLARY
Glucose-Capillary: 72 mg/dL (ref 70–99)
Glucose-Capillary: 137 mg/dL — ABNORMAL HIGH (ref 70–99)
Glucose-Capillary: 141 mg/dL — ABNORMAL HIGH (ref 70–99)

## 2020-12-18 LAB — BASIC METABOLIC PANEL
Anion gap: 6 (ref 5–15)
BUN: 58 mg/dL — ABNORMAL HIGH (ref 8–23)
CO2: 24 mmol/L (ref 22–32)
Calcium: 8.3 mg/dL — ABNORMAL LOW (ref 8.9–10.3)
Chloride: 111 mmol/L (ref 98–111)
Creatinine, Ser: 3.12 mg/dL — ABNORMAL HIGH (ref 0.61–1.24)
GFR, Estimated: 21 mL/min — ABNORMAL LOW (ref >60)
Glucose, Bld: 85 mg/dL (ref 70–99)
Potassium: 4.8 mmol/L (ref 3.5–5.1)
Sodium: 141 mmol/L (ref 135–145)

## 2020-12-18 LAB — HIV ANTIBODY (ROUTINE TESTING W REFLEX): HIV Screen 4th Generation wRfx: NONREACTIVE

## 2020-12-18 MED ORDER — ENOXAPARIN SODIUM 100 MG/ML IJ SOSY
90.0000 mg | PREFILLED_SYRINGE | INTRAMUSCULAR | Status: DC
  Administered 2020-12-18 – 2020-12-27 (×10): 90 mg via SUBCUTANEOUS
  Filled 2020-12-18 (×10): qty 1

## 2020-12-18 NOTE — Progress Notes (Signed)
Notified MD of patient positive blood cultures.  Cultures positive for staph epidermidis.  No new orders received at this time.

## 2020-12-18 NOTE — Evaluation (Signed)
Physical Therapy Evaluation Patient Details Name: Eddie Fletcher MRN: [id number] DOB: 1955/10/05 Today's Date: 12/18/2020   History of Present Illness  Eddie Fletcher is a 65 y.o. male who presents with AMS. Of note, pt with multiple hospital encounters for chronic low back pain, most recently here at [facility] and discharged 12/15/20. PMH: cardiomegaly, HTN, obesity, SOB, R TKA 01/2014 and revision 12/2014  Clinical Impression  Pt admitted with above diagnosis. Pt difficult to follow, reports he walked at his dad's funeral 1 week ago, has been staying at a hotel since last hospitalization but hasn't been walking and his spouse has been dropping off food and checking on him, doesn't elaborate on self care tasks. Pt currently requires max A +2 for rolling into sidelying, difficulty reaching across body for bedrail due to body habitus. Pt denies pain due to recently receiving medication. Pt on air mattress, unable to scoot self up in bed or achieve hooklying positioning, requiring assistance. Pt open to SNF to regain strength and mobility, states he was ambulating ~1 week ago. Also recommending OT eval due to questionable ability to perform self care tasks and noted UE weakness at eval. Pt currently with functional limitations due to the deficits listed below (see PT Problem List). Pt will benefit from skilled PT to increase their independence and safety with mobility to allow discharge to the venue listed below.       Follow Up Recommendations SNF    Equipment Recommendations  Other (comment) (TBD)    Recommendations for Other Services OT consult     Precautions / Restrictions Precautions Precautions: Fall Restrictions Weight Bearing Restrictions: No      Mobility  Bed Mobility Overal bed mobility: Needs Assistance Bed Mobility: Rolling Rolling: Max assist;+2 for physical assistance  General bed mobility comments: max A +2 to achieve full sidelying position, pt able to reach for bedrail on  same side but unable to reach across to assist in rolling trunk due to body habitus, requires assist to assume hooklying position and maintain and unable to scoot up in bed due to air mattress    Transfers  General transfer comment: not attempted  Ambulation/Gait  General Gait Details: not attempted  Stairs            Wheelchair Mobility    Modified Rankin (Stroke Patients Only)       Balance                Pertinent Vitals/Pain Pain Assessment: No/denies pain ("I just got medicine")    Home Living Family/patient expects to be discharged to:: Skilled nursing facility  Additional Comments: Pt difficult to follow, states living in [facility] while spouse and daughter live in their house- he is unable to get into the home due to the state of the ramp.    Prior Function   Comments: Pt difficult to follow, reports sometimes able to get up and walk to restroom, sometimes falls, sometimes his spouse helps him up, has w/c that he can use. Unable to explain self care tasks, reports spouse delivers him meals.     Hand Dominance        Extremity/Trunk Assessment   Upper Extremity Assessment Upper Extremity Assessment: Generalized weakness    Lower Extremity Assessment Lower Extremity Assessment: Generalized weakness (ankle AROM WFL, knee AROM ~50%, hip AROM ~50%)       Communication   Communication: No difficulties  Cognition Arousal/Alertness: Awake/alert Behavior During Therapy: WFL for tasks assessed/performed Overall Cognitive Status: No  family/caregiver present to determine baseline cognitive functioning  General Comments: Pt tangential at times, difficult to redirect to question, no family present during eval.      General Comments      Exercises     Assessment/Plan    PT Assessment Patient needs continued PT services  PT Problem List Decreased strength;Decreased range of motion;Decreased activity tolerance;Decreased balance;Decreased  mobility;Obesity;Pain       PT Treatment Interventions DME instruction;Gait training;Functional mobility training;Therapeutic activities;Therapeutic exercise;Balance training;Patient/family education    PT Goals (Current goals can be found in the Care Plan section)  Acute Rehab PT Goals Patient Stated Goal: regain strength and independence PT Goal Formulation: With patient Time For Goal Achievement: 01/01/21 Potential to Achieve Goals: Fair    Frequency Min 2X/week   Barriers to discharge        Co-evaluation               AM-PAC PT "6 Clicks" Mobility  Outcome Measure Help needed turning from your back to your side while in a flat bed without using bedrails?: A Lot Help needed moving from lying on your back to sitting on the side of a flat bed without using bedrails?: Total Help needed moving to and from a bed to a chair (including a wheelchair)?: Total Help needed standing up from a chair using your arms (e.g., wheelchair or bedside chair)?: Total Help needed to walk in hospital room?: Total Help needed climbing 3-5 steps with a railing? : Total 6 Click Score: 7    End of Session Equipment Utilized During Treatment: Oxygen Activity Tolerance: Patient tolerated treatment well Patient left: in chair;with call bell/phone within reach Nurse Communication: Mobility status PT Visit Diagnosis: Muscle weakness (generalized) (M62.81);Other abnormalities of gait and mobility (R26.89)    Time: [id number] PT Time Calculation (min) (ACUTE ONLY): 11 min   Charges:   PT Evaluation $PT Eval Low Complexity: 1 Low           Tori Marquese Burkland PT, DPT 12/18/20, 12:39 PM

## 2020-12-18 NOTE — Progress Notes (Signed)
Date and time results received: 12/18/20 @ 1433 (use smartphrase ".now" to insert current time)  Test: aerobic blood culture bottle Critical Value: gram + cocci in clusters  Name of Provider Notified: Manuella Ghazi  Orders Received? Or Actions Taken?:  Awaiting orders

## 2020-12-18 NOTE — Plan of Care (Signed)
  Problem: Acute Rehab PT Goals(only PT should resolve) Goal: Pt will Roll Supine to Side Outcome: Progressing Flowsheets (Taken 12/18/2020 1247) Pt will Roll Supine to Side: with mod assist Goal: Pt Will Go Supine/Side To Sit Outcome: Progressing Flowsheets (Taken 12/18/2020 1247) Pt will go Supine/Side to Sit:  with moderate assist  with +2 Goal: Pt Will Go Sit To Supine/Side Outcome: Progressing Flowsheets (Taken 12/18/2020 1247) Pt will go Sit to Supine/Side:  with moderate assist  with +2 Goal: Patient Will Transfer Sit To/From Stand Outcome: Progressing Flowsheets (Taken 12/18/2020 1247) Patient will transfer sit to/from stand:  with maximum assist  with +2   Tori Antavia Tandy PT, DPT 12/18/20, 12:48 PM

## 2020-12-18 NOTE — Progress Notes (Signed)
PROGRESS NOTE    [name]  [id number] DOB: Jan 23, 1956 DOA: 12/17/2020 PCP: Curly Rim, MD   Brief Narrative:   Eddie Fletcher is a 65 y.o. male with medical history significant for HTN, morbid obesity, sleep apnea. Patient was brought to the ED via EMS reports of altered mental status.  Patient apparently has tried for SNF placement in the past, but was denied multiple bed offers.  He has apparently had multiple falls at home to and has difficulty ambulating.  EMS had noted that he had pinpoint pupils and was given some Narcan with improvement in mentation noted.  His mentation has improved and PT evaluation is pending.  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 -Baseline creatinine uncertain and usually between 1.8-2.3 -BNP elevation noted, hold further IV fluid -Wean oxygen as tolerated -Creatinine level slightly improved  OSA -Does not wear CPAP at home, and refuses here  Chronic low back pain -MRI could not be performed due to patient's body habitus -EDP spoke with neurosurgery who suggested CT myelogram later on as an outpatient -Patient currently has good pain control  Multiple falls -PT evaluation -Hopeful SNF placement  Morbid obesity   DVT prophylaxis: Lovenox Code Status: Full Family Communication: Discussed with wife on phone 8/11 Disposition Plan:  Status is: Inpatient  Remains inpatient appropriate because:Altered mental status, IV treatments appropriate due  to intensity of illness or inability to take PO, and Inpatient level of care appropriate due to severity of illness  Dispo: The patient is from: Home              Anticipated d/c is to: SNF              Patient currently is not medically stable to d/c.   Difficult to place patient No  Consultants:  None  Procedures:  See below  Antimicrobials:  Anti-infectives (From admission, onward)    Start     Dose/Rate Route Frequency Ordered Stop   12/18/20 1700  cefTRIAXone (ROCEPHIN) 2 g in sodium chloride 0.9 % 100 mL IVPB        2 g 200 mL/hr over 30 Minutes Intravenous Every 24 hours 12/17/20 1943     12/17/20 1930  cefTRIAXone (ROCEPHIN) 1 g in sodium chloride 0.9 % 100 mL IVPB       Note to Pharmacy: Totalling 2g, adjusted for weight.   1 g 200 mL/hr over 30 Minutes Intravenous  Once 12/17/20 1919 12/17/20 2045   12/17/20 1700  cefTRIAXone (ROCEPHIN) 1 g in sodium chloride 0.9 % 100 mL IVPB        1 g 200 mL/hr over 30 Minutes Intravenous  Once 12/17/20 1650 12/17/20 1759       Subjective: Patient seen and evaluated today with no new acute complaints or concerns. No acute concerns or events noted overnight.  He is more awake and alert and denies any significant back pain.  Objective: Vitals:   12/17/20 1845 12/17/20 1900 12/17/20 1949 12/18/20 0422  BP:  (!) 140/59 Marland Kitchen)  115/44 (!) 135/57  Pulse: 96 96 94 97  Resp:  '11 14 19  '$ Temp:   98.6 F (37 C) 98.1 F (36.7 C)  TempSrc:   Oral   SpO2: 100% 100% 100% 100%  Weight:      Height:        Intake/Output Summary (Last 24 hours) at 12/18/2020 1158 Last data filed at 12/18/2020 0900 Gross per 24 hour  Intake 1154.67 ml  Output 800 ml  Net 354.67 ml   Filed Weights   12/17/20 1341  Weight: (!) 182 kg    Examination:  General exam: Appears calm and comfortable, morbidly obese Respiratory system: Clear to auscultation. Respiratory effort normal.  On nasal cannula 2 L Cardiovascular system: S1 & S2 heard, RRR.   Gastrointestinal system: Abdomen is soft Central nervous system: Alert and awake Extremities: No edema Skin: No significant lesions noted Psychiatry: Flat affect.    Data Reviewed: I have personally reviewed following labs and imaging studies  CBC: Recent Labs  Lab 12/12/20 1353 12/17/20 1529 12/18/20 0444  WBC 14.7* 10.2 7.2  NEUTROABS 11.9* 8.5*  --   HGB 11.5* 11.1* 9.5*  HCT 36.7* 34.8* 30.9*  MCV 93.9 93.5 93.6  PLT 138* 109* 93*   Basic Metabolic Panel: Recent Labs  Lab 12/12/20 1353 12/17/20 1529 12/18/20 0444  NA 135 139 141  K 3.9 4.5 4.8  CL 106 107 111  CO2 '24 23 24  '$ GLUCOSE 126* 89 85  BUN 44* 58* 58*  CREATININE 3.05* 3.40* 3.12*  CALCIUM 7.6* 8.4* 8.3*   GFR: Estimated Creatinine Clearance: 38.1 mL/min (A) (by C-G formula based on SCr of 3.12 mg/dL (H)). Liver Function Tests: Recent Labs  Lab 12/17/20 1529  AST 31  ALT 24  ALKPHOS 113  BILITOT 1.1  PROT 7.3  ALBUMIN 2.5*   No results for input(s): LIPASE, AMYLASE in the last 168 hours. No results for input(s): AMMONIA in the last 168 hours. Coagulation Profile: No results for input(s): INR, PROTIME in the last 168 hours. Cardiac Enzymes: No results for input(s): CKTOTAL, CKMB, CKMBINDEX, TROPONINI in the last 168 hours. BNP (last 3 results) No results for input(s): PROBNP in the last 8760 hours. HbA1C: No results for input(s): HGBA1C in the last 72 hours. CBG: Recent Labs  Lab 12/17/20 2132 12/18/20 0716 12/18/20 1119  GLUCAP 91 72 137*   Lipid Profile: No results for input(s): CHOL, HDL, LDLCALC, TRIG, CHOLHDL, LDLDIRECT in the last 72 hours. Thyroid Function Tests: No results for input(s): TSH, T4TOTAL, FREET4, T3FREE, THYROIDAB in the last 72 hours. Anemia Panel: No results for input(s): VITAMINB12, FOLATE, FERRITIN, TIBC, IRON, RETICCTPCT in the last 72 hours. Sepsis Labs: Recent Labs  Lab 12/17/20 1529 12/17/20 2009  LATICACIDVEN 2.0* 2.1*    Recent Results (from  the past 240 hour(s))  Resp Panel by RT-PCR (Flu A&B, Covid) Nasopharyngeal Swab     Status: None   Collection Time: 12/12/20  4:03 PM   Specimen: Nasopharyngeal Swab; Nasopharyngeal(NP) swabs in vial transport medium  Result Value Ref Range Status   SARS Coronavirus 2 by RT PCR NEGATIVE NEGATIVE Final    Comment: (NOTE) SARS-CoV-2 target nucleic acids are NOT DETECTED.  The SARS-CoV-2 RNA is generally detectable in upper respiratory specimens during the acute phase of infection. The lowest concentration of SARS-CoV-2 viral copies this assay can detect is 138 copies/mL. A negative result does not preclude SARS-Cov-2 infection and should not be used as the sole basis for treatment or other  patient management decisions. A negative result may occur with  improper specimen collection/handling, submission of specimen other than nasopharyngeal swab, presence of viral mutation(s) within the areas targeted by this assay, and inadequate number of viral copies(<138 copies/mL). A negative result must be combined with clinical observations, patient history, and epidemiological information. The expected result is Negative.  Fact Sheet for Patients:  EntrepreneurPulse.com.au  Fact Sheet for Healthcare Providers:  IncredibleEmployment.be  This test is no t yet approved or cleared by the [place] FDA and  has been authorized for detection and/or diagnosis of SARS-CoV-2 by FDA under an Emergency Use Authorization (EUA). This EUA will remain  in effect (meaning this test can be used) for the duration of the COVID-19 declaration under Section 564(b)(1) of the Act, 21 U.S.C.section 360bbb-3(b)(1), unless the authorization is terminated  or revoked sooner.       Influenza A by PCR NEGATIVE NEGATIVE Final   Influenza B by PCR NEGATIVE NEGATIVE Final    Comment: (NOTE) The Xpert Xpress SARS-CoV-2/FLU/RSV plus assay is intended as an aid in the diagnosis of  influenza from Nasopharyngeal swab specimens and should not be used as a sole basis for treatment. Nasal washings and aspirates are unacceptable for Xpert Xpress SARS-CoV-2/FLU/RSV testing.  Fact Sheet for Patients: EntrepreneurPulse.com.au  Fact Sheet for Healthcare Providers: IncredibleEmployment.be  This test is not yet approved or cleared by the [place] FDA and has been authorized for detection and/or diagnosis of SARS-CoV-2 by FDA under an Emergency Use Authorization (EUA). This EUA will remain in effect (meaning this test can be used) for the duration of the COVID-19 declaration under Section 564(b)(1) of the Act, 21 U.S.C. section 360bbb-3(b)(1), unless the authorization is terminated or revoked.  Performed at [facility], [address] [place], [place] [address]   Culture, blood (Routine X 2) w Reflex to ID Panel     Status: None (Preliminary result)   Collection Time: 12/17/20  3:29 PM   Specimen: BLOOD LEFT ARM  Result Value Ref Range Status   Specimen Description BLOOD LEFT ARM  Final   Special Requests   Final    BOTTLES DRAWN AEROBIC AND ANAEROBIC Blood Culture results may not be optimal due to an excessive volume of blood received in culture bottles Performed at [facility], [address], [place], [place] [address]    Culture PENDING  Incomplete   Report Status PENDING  Incomplete  Culture, blood (Routine X 2) w Reflex to ID Panel     Status: None (Preliminary result)   Collection Time: 12/17/20  3:29 PM   Specimen: Right Antecubital; Blood  Result Value Ref Range Status   Specimen Description RIGHT ANTECUBITAL  Final   Special Requests   Final    BOTTLES DRAWN AEROBIC AND ANAEROBIC Blood Culture adequate volume Performed at [facility], [address], [place],  [address]    Culture PENDING  Incomplete   Report Status PENDING  Incomplete  Resp Panel by RT-PCR (Flu A&B, Covid) Nasopharyngeal Swab     Status:  None   Collection Time: 12/17/20  5:37 PM   Specimen: Nasopharyngeal Swab; Nasopharyngeal(NP) swabs in vial transport medium  Result Value Ref Range Status   SARS Coronavirus 2 by RT PCR NEGATIVE NEGATIVE Final    Comment: (NOTE) SARS-CoV-2 target nucleic acids are NOT DETECTED.  The SARS-CoV-2 RNA is generally detectable in upper respiratory specimens during the acute phase of infection. The lowest concentration of SARS-CoV-2 viral copies this assay can detect is 138  copies/mL. A negative result does not preclude SARS-Cov-2 infection and should not be used as the sole basis for treatment or other patient management decisions. A negative result may occur with  improper specimen collection/handling, submission of specimen other than nasopharyngeal swab, presence of viral mutation(s) within the areas targeted by this assay, and inadequate number of viral copies(<138 copies/mL). A negative result must be combined with clinical observations, patient history, and epidemiological information. The expected result is Negative.  Fact Sheet for Patients:  EntrepreneurPulse.com.au  Fact Sheet for Healthcare Providers:  IncredibleEmployment.be  This test is no t yet approved or cleared by the [place] FDA and  has been authorized for detection and/or diagnosis of SARS-CoV-2 by FDA under an Emergency Use Authorization (EUA). This EUA will remain  in effect (meaning this test can be used) for the duration of the COVID-19 declaration under Section 564(b)(1) of the Act, 21 U.S.C.section 360bbb-3(b)(1), unless the authorization is terminated  or revoked sooner.       Influenza A by PCR NEGATIVE NEGATIVE Final   Influenza B by PCR NEGATIVE NEGATIVE Final    Comment: (NOTE) The Xpert Xpress SARS-CoV-2/FLU/RSV plus assay is intended as an aid in the diagnosis of influenza from Nasopharyngeal swab specimens and should not be used as a sole basis for  treatment. Nasal washings and aspirates are unacceptable for Xpert Xpress SARS-CoV-2/FLU/RSV testing.  Fact Sheet for Patients: EntrepreneurPulse.com.au  Fact Sheet for Healthcare Providers: IncredibleEmployment.be  This test is not yet approved or cleared by the [place] FDA and has been authorized for detection and/or diagnosis of SARS-CoV-2 by FDA under an Emergency Use Authorization (EUA). This EUA will remain in effect (meaning this test can be used) for the duration of the COVID-19 declaration under Section 564(b)(1) of the Act, 21 U.S.C. section 360bbb-3(b)(1), unless the authorization is terminated or revoked.  Performed at [facility], [address] [place], [place] [address]          Radiology Studies: CT Head Wo Contrast  Result Date: 12/17/2020 CLINICAL DATA:  Altered mental status EXAM: CT HEAD WITHOUT CONTRAST TECHNIQUE: Contiguous axial images were obtained from the base of the skull through the vertex without intravenous contrast. COMPARISON:  CT head 12/03/2003 FINDINGS: Brain: There is no acute intracranial hemorrhage, extra-axial fluid collection, or infarct. No mass lesion is identified. The ventricles are not enlarged. There is no midline shift. Vascular: No hyperdense vessel or unexpected calcification. Skull: Normal. Negative for fracture or focal lesion. Sinuses/Orbits: There is minimal mucosal thickening in the paranasal sinuses. Bilateral lens implants are noted. The orbits and globes are otherwise unremarkable. Other: There is trace fluid in the left mastoid air cells. Fluid is also seen in the left middle ear cavity. IMPRESSION: 1. No acute intracranial pathology. 2. Small left mastoid and middle ear effusions. Correlate with any symptoms. Electronically Signed   By: Valetta Mole MD   On: 12/17/2020 15:35   CT Cervical Spine Wo Contrast  Result Date: 12/17/2020 CLINICAL DATA:  Poly trauma EXAM: CT CERVICAL SPINE  WITHOUT CONTRAST TECHNIQUE: Multidetector CT imaging of the cervical spine was performed without intravenous contrast. Multiplanar CT image reconstructions were also generated. COMPARISON:  Cervical spine radiographs 11/30/2003 FINDINGS: Alignment: There is straightening of the normal cervical spine lordosis. There is no anterior or retrolisthesis. Skull base and vertebrae: Vertebral body heights are preserved. There is no evidence of acute fracture. There is no suspicious osseous lesion. Soft tissues and spinal canal: No prevertebral fluid or swelling. No visible  canal hematoma. Disc levels: There is intervertebral disc space narrowing with associated uncovertebral arthropathy at C5-C6 and C6-C7. The osseous spinal canal is patent. There is moderate to severe right neural foraminal stenosis at C5-C6. Upper chest: The lung apices are not imaged. Other: None. IMPRESSION: 1. No acute fracture or traumatic malalignment of the cervical spine. 2. Degenerative changes most advanced at C5-C6 where there is moderate to severe right neural foraminal stenosis. Electronically Signed   By: Valetta Mole MD   On: 12/17/2020 15:39   DG Chest Port 1 View  Result Date: 12/17/2020 CLINICAL DATA:  Altered mental status EXAM: PORTABLE CHEST 1 VIEW COMPARISON:  Chest radiograph 01/09/2015 FINDINGS: The heart is markedly enlarged. The mediastinal contours are otherwise within normal limits. There is pulmonary vascular congestion with likely mild pulmonary interstitial edema. There is probably a left pleural effusion. There is no definite right effusion. There is no pneumothorax. Retrocardiac opacity may reflect atelectasis or pneumonia. There is no acute osseous abnormality. IMPRESSION: 1. Marked cardiomegaly (progressed since 2016) with vascular congestion and probable mild pulmonary interstitial edema and a left pleural effusion. 2. Retrocardiac opacity may reflect atelectasis or pneumonia in the correct clinical setting.  Electronically Signed   By: Valetta Mole MD   On: 12/17/2020 15:30        Scheduled Meds:  enoxaparin (LOVENOX) injection  40 mg Subcutaneous Q24H   fluticasone  1 spray Each Nare Daily   tamsulosin  0.4 mg Oral QHS   Continuous Infusions:  cefTRIAXone (ROCEPHIN)  IV       LOS: 1 day    Time spent: 35 minutes    Donta Fuster Darleen Crocker, DO [facility]  If 7PM-7AM, please contact night-coverage [email or web address] 12/18/2020, 11:58 AM

## 2020-12-19 DIAGNOSIS — G9341 Metabolic encephalopathy: Secondary | ICD-10-CM | POA: Diagnosis not present

## 2020-12-19 LAB — GLUCOSE, CAPILLARY: Glucose-Capillary: 131 mg/dL — ABNORMAL HIGH (ref 70–99)

## 2020-12-19 NOTE — TOC Progression Note (Addendum)
Transition of Care [facility]) - Progression Note    Patient Details  Name: Eddie Fletcher MRN: [id number] Date of Birth: [birth year]  [facility]) CM/SW Contact  Natasha Bence, LCSW Phone Number: 12/19/2020, 12:43 PM  Clinical Narrative:    Patient's family agreeable to [facility]. Debbie with [facility] agreeable to take patient. Jackelyn Poling also agreeable to start auth. TOC to follow.   Expected Discharge Plan: Skilled Nursing Facility Barriers to Discharge: Barriers Resolved  Expected Discharge Plan and Services Expected Discharge Plan: [place]                                               Social Determinants of Health (SDOH) Interventions    Readmission Risk Interventions No flowsheet data found.

## 2020-12-19 NOTE — Care Management Important Message (Signed)
Important Message  Patient Details  Name: Eddie Fletcher MRN: [id number] Date of Birth: 1956/01/25   Medicare Important Message Given:  Yes     Tommy Medal 12/19/2020, 10:36 AM

## 2020-12-19 NOTE — TOC Initial Note (Signed)
Transition of Care [facility]) - Initial/Assessment Note    Patient Details  Name: Eddie Fletcher MRN: [id number] Date of Birth: Nov 15, 1955  Transition of Care [facility]) CM/SW Contact:    Natasha Bence, LCSW Phone Number: 12/19/2020, 12:46 PM  Clinical Narrative:                 Patient is a 65 year old male admitted for Acute metabolic encephalopathy. CSW conducted initial assessment Patient's wife reported that patient lives at a motel, due to in ability to ambulate will minimal walking space in the home. Patient's wife reported that her and patient's daughter assist with ADL's and visit patient daily or weekly depending on patient's needs. Patient's wife agreeable to referrals to local SNF's. CSW completed FL2 and faxed referral TOC to follow.   Expected Discharge Plan: Skilled Nursing Facility Barriers to Discharge: Barriers Resolved   Patient Goals and CMS Choice Patient states their goals for this hospitalization and ongoing recovery are:: Rehab with SNF CMS Medicare.gov Compare Post Acute Care list provided to:: Patient Choice offered to / list presented to : Patient, Adult Children  Expected Discharge Plan and Services Expected Discharge Plan: [place]                                              Prior Living Arrangements/Services   Lives with:: Self Patient language and need for interpreter reviewed:: Yes Do you feel safe going back to the place where you live?: Yes      Need for Family Participation in Patient Care: Yes (Comment) Care giver support system in place?: Yes (comment)   Criminal Activity/Legal Involvement Pertinent to Current Situation/Hospitalization: No - Comment as needed  Activities of Daily Living Home Assistive Devices/Equipment: Cane (specify quad or straight), Walker (specify type) ADL Screening (condition at time of admission) Patient's cognitive ability adequate to safely complete daily activities?: Yes Is the patient  deaf or have difficulty hearing?: No Does the patient have difficulty seeing, even when wearing glasses/contacts?: No Does the patient have difficulty concentrating, remembering, or making decisions?: No Patient able to express need for assistance with ADLs?: Yes Does the patient have difficulty dressing or bathing?: Yes Independently performs ADLs?: No Does the patient have difficulty walking or climbing stairs?: Yes Weakness of Legs: Both Weakness of Arms/Hands: Both  Permission Sought/Granted   Permission granted to share information with : Yes, Verbal Permission Granted  Share Information with NAME: Daughter  Permission granted to share info w AGENCY: Local SNF's  Permission granted to share info w Relationship: Gajda,Meagan  Permission granted to share info w Contact Information: [phone]  Emotional Assessment       Orientation: : Oriented to Self, Oriented to Situation, Oriented to Place, Oriented to  Time Alcohol / Substance Use: Not Applicable Psych Involvement: No (comment)  Admission diagnosis:  Acute cystitis without hematuria [N30.00] Injury of head, initial encounter [S09.90XA] Acute respiratory failure with hypoxia and hypercapnia ([place]) [J96.01, J96.02] Opioid overdose, undetermined intent, initial encounter ([place]) [T40.2X4A] Altered mental status, unspecified altered mental status type [R41.82] AMS (altered mental status) [R41.82] Chronic low back pain, unspecified back pain laterality, unspecified whether sciatica present [M54.50, XX123456 Acute metabolic encephalopathy 99991111 Patient Active Problem List   Diagnosis Date Noted   Acute metabolic encephalopathy 99991111   Acute kidney injury superimposed on CKD ([place]) 12/17/2020   Acute lower UTI  12/17/2020   Morbid obesity with BMI of 60.0-69.9, adult ([place]) 12/17/2020   Multiple falls 12/17/2020   Chronic low back pain with left-sided sciatica 12/12/2020   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   Sepsis due to Staphylococcus aureus ([place]) 01/09/2015   Elevated LFTs    Acute renal failure ([place])    AKI (acute kidney injury) ([place]) 01/08/2015   Hypotension 01/08/2015   Septic joint of right knee joint ([place]) 01/08/2015   Septic joint of left knee joint ([place]) 01/03/2015   Besnier-Boeck disease 02/05/2014   Apnea, sleep 02/05/2014   Avitaminosis D 02/05/2014   BP (high blood pressure) 02/05/2014   Colon polyp 02/05/2014   Osteoarthritis of right knee 02/05/2014   Total knee replacement status 01/30/2014   PCP:  Curly Rim, MD Pharmacy:   [id number] [place], [address] [address]Phone: [phone]: [phone]    Social Determinants of Health (SDOH) Interventions    Readmission Risk Interventions No flowsheet data found.

## 2020-12-19 NOTE — Progress Notes (Signed)
PROGRESS NOTE    Eddie Fletcher  [id number] DOB: Dec 16, 1955 DOA: 12/17/2020 PCP: Curly Rim, MD   Brief Narrative:   Eddie Fletcher is a 65 y.o. male with medical history significant for HTN, morbid obesity, sleep apnea. Patient was brought to the ED via EMS reports of altered mental status.  Patient apparently has tried for SNF placement in the past, but was denied multiple bed offers.  He has apparently had multiple falls at home to and has difficulty ambulating.  EMS had noted that he had pinpoint pupils and was given some Narcan with improvement in mentation noted.  His mentation has improved and PT evaluation is pending.  Assessment & Plan:   Principal Problem:   Acute metabolic encephalopathy Active Problems:   Apnea, sleep   Acute kidney injury superimposed on CKD ([place])   Acute lower UTI   Morbid obesity with BMI of 60.0-69.9, adult (HCC)   Multiple falls   Acute metabolic encephalopathy-multifactorial -Likely a combination of UTI as well as narcotic medications -Continue IV Rocephin -Blood and urine cultures with staph epidermis contaminant -Hold psychoactive agents to include gabapentin and Percocet -Patient denies any significant back pain   UTI -Continue IV Rocephin for 1 more day to complete 3-day course of treatment   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 -Baseline creatinine uncertain and usually between 1.8-2.3 -BNP elevation noted, hold further IV fluid -Wean oxygen as tolerated -Creatinine level slightly improved   OSA -Does not wear CPAP at home, and refuses here   Chronic low back pain -MRI could not be performed due to patient's body habitus -EDP spoke with neurosurgery who suggested CT myelogram later on as an outpatient -Patient currently has good pain control   Multiple falls -PT evaluation recommending SNF   Morbid obesity     DVT prophylaxis: Lovenox Code Status: Full Family Communication: Discussed with wife on phone 8/11 Disposition  Plan:  Status is: Inpatient   Remains inpatient appropriate because:Altered mental status, IV treatments appropriate due to intensity of illness or inability to take PO, and Inpatient level of care appropriate due to severity of illness   Dispo: The patient is from: Home              Anticipated d/c is to: SNF              Patient currently is not medically stable to d/c.              Difficult to place patient No   Consultants:  None   Procedures:  See below  Antimicrobials:  Anti-infectives (From admission, onward)    Start     Dose/Rate Route Frequency Ordered Stop   12/18/20 1700  cefTRIAXone (ROCEPHIN) 2 g in sodium chloride 0.9 % 100 mL IVPB        2 g 200 mL/hr over 30 Minutes Intravenous Every 24 hours 12/17/20 1943     12/17/20 1930  cefTRIAXone (ROCEPHIN) 1 g in sodium chloride 0.9 % 100 mL IVPB       Note to Pharmacy: Totalling 2g, adjusted for weight.   1 g 200 mL/hr over 30 Minutes Intravenous  Once 12/17/20 1919 12/17/20 2045   12/17/20 1700  cefTRIAXone (ROCEPHIN) 1 g in sodium chloride 0.9 % 100 mL IVPB        1 g 200 mL/hr over 30 Minutes Intravenous  Once 12/17/20 1650 12/17/20 1759      Subjective: Patient seen and evaluated today with no new acute complaints  or concerns. No acute concerns or events noted overnight.  Objective: Vitals:   12/18/20 1349 12/18/20 2153 12/19/20 0520 12/19/20 1317  BP: (!) 110/53 (!) 162/46 (!) 156/68 (!) 129/59  Pulse: 92 90 81 92  Resp: '18 20  20  '$ Temp: 98.4 F (36.9 C) 98.9 F (37.2 C) 97.9 F (36.6 C) 98.9 F (37.2 C)  TempSrc: Oral   Oral  SpO2: 94% 96% 97% 97%  Weight:      Height:        Intake/Output Summary (Last 24 hours) at 12/19/2020 1357 Last data filed at 12/19/2020 0900 Gross per 24 hour  Intake 940.95 ml  Output 900 ml  Net 40.95 ml   Filed Weights   12/17/20 1341  Weight: (!) 182 kg    Examination:  General exam: Appears calm and comfortable, morbidly obese Respiratory system: Clear to  auscultation. Respiratory effort normal. Cardiovascular system: S1 & S2 heard, RRR.  Gastrointestinal system: Abdomen is soft Central nervous system: Alert and awake Extremities: No edema Skin: No significant lesions noted Psychiatry: Flat affect.    Data Reviewed: I have personally reviewed following labs and imaging studies  CBC: Recent Labs  Lab 12/17/20 1529 12/18/20 0444  WBC 10.2 7.2  NEUTROABS 8.5*  --   HGB 11.1* 9.5*  HCT 34.8* 30.9*  MCV 93.5 93.6  PLT 109* 93*   Basic Metabolic Panel: Recent Labs  Lab 12/17/20 1529 12/18/20 0444  NA 139 141  K 4.5 4.8  CL 107 111  CO2 23 24  GLUCOSE 89 85  BUN 58* 58*  CREATININE 3.40* 3.12*  CALCIUM 8.4* 8.3*   GFR: Estimated Creatinine Clearance: 38.1 mL/min (A) (by C-G formula based on SCr of 3.12 mg/dL (H)). Liver Function Tests: Recent Labs  Lab 12/17/20 1529  AST 31  ALT 24  ALKPHOS 113  BILITOT 1.1  PROT 7.3  ALBUMIN 2.5*   No results for input(s): LIPASE, AMYLASE in the last 168 hours. No results for input(s): AMMONIA in the last 168 hours. Coagulation Profile: No results for input(s): INR, PROTIME in the last 168 hours. Cardiac Enzymes: No results for input(s): CKTOTAL, CKMB, CKMBINDEX, TROPONINI in the last 168 hours. BNP (last 3 results) No results for input(s): PROBNP in the last 8760 hours. HbA1C: No results for input(s): HGBA1C in the last 72 hours. CBG: Recent Labs  Lab 12/17/20 2132 12/18/20 0716 12/18/20 1119 12/18/20 1557 12/19/20 0715  GLUCAP 91 72 137* 141* 131*   Lipid Profile: No results for input(s): CHOL, HDL, LDLCALC, TRIG, CHOLHDL, LDLDIRECT in the last 72 hours. Thyroid Function Tests: No results for input(s): TSH, T4TOTAL, FREET4, T3FREE, THYROIDAB in the last 72 hours. Anemia Panel: No results for input(s): VITAMINB12, FOLATE, FERRITIN, TIBC, IRON, RETICCTPCT in the last 72 hours. Sepsis Labs: Recent Labs  Lab 12/17/20 1529 12/17/20 2009  LATICACIDVEN 2.0* 2.1*     Recent Results (from the past 240 hour(s))  Resp Panel by RT-PCR (Flu A&B, Covid) Nasopharyngeal Swab     Status: None   Collection Time: 12/12/20  4:03 PM   Specimen: Nasopharyngeal Swab; Nasopharyngeal(NP) swabs in vial transport medium  Result Value Ref Range Status   SARS Coronavirus 2 by RT PCR NEGATIVE NEGATIVE Final    Comment: (NOTE) SARS-CoV-2 target nucleic acids are NOT DETECTED.  The SARS-CoV-2 RNA is generally detectable in upper respiratory specimens during the acute phase of infection. The lowest concentration of SARS-CoV-2 viral copies this assay can detect is 138 copies/mL. A negative result does  not preclude SARS-Cov-2 infection and should not be used as the sole basis for treatment or other patient management decisions. A negative result may occur with  improper specimen collection/handling, submission of specimen other than nasopharyngeal swab, presence of viral mutation(s) within the areas targeted by this assay, and inadequate number of viral copies(<138 copies/mL). A negative result must be combined with clinical observations, patient history, and epidemiological information. The expected result is Negative.  Fact Sheet for Patients:  EntrepreneurPulse.com.au  Fact Sheet for Healthcare Providers:  IncredibleEmployment.be  This test is no t yet approved or cleared by the [place] FDA and  has been authorized for detection and/or diagnosis of SARS-CoV-2 by FDA under an Emergency Use Authorization (EUA). This EUA will remain  in effect (meaning this test can be used) for the duration of the COVID-19 declaration under Section 564(b)(1) of the Act, 21 U.S.C.section 360bbb-3(b)(1), unless the authorization is terminated  or revoked sooner.       Influenza A by PCR NEGATIVE NEGATIVE Final   Influenza B by PCR NEGATIVE NEGATIVE Final    Comment: (NOTE) The Xpert Xpress SARS-CoV-2/FLU/RSV plus assay is intended as an  aid in the diagnosis of influenza from Nasopharyngeal swab specimens and should not be used as a sole basis for treatment. Nasal washings and aspirates are unacceptable for Xpert Xpress SARS-CoV-2/FLU/RSV testing.  Fact Sheet for Patients: EntrepreneurPulse.com.au  Fact Sheet for Healthcare Providers: IncredibleEmployment.be  This test is not yet approved or cleared by the [place] FDA and has been authorized for detection and/or diagnosis of SARS-CoV-2 by FDA under an Emergency Use Authorization (EUA). This EUA will remain in effect (meaning this test can be used) for the duration of the COVID-19 declaration under Section 564(b)(1) of the Act, 21 U.S.C. section 360bbb-3(b)(1), unless the authorization is terminated or revoked.  Performed at [facility], [address] [place], [place] [address]   Culture, blood (Routine X 2) w Reflex to ID Panel     Status: None (Preliminary result)   Collection Time: 12/17/20  3:29 PM   Specimen: BLOOD LEFT ARM  Result Value Ref Range Status   Specimen Description BLOOD LEFT ARM  Final   Special Requests   Final    BOTTLES DRAWN AEROBIC AND ANAEROBIC Blood Culture results may not be optimal due to an excessive volume of blood received in culture bottles   Culture   Final    NO GROWTH 2 DAYS Performed at [facility], [address], [place], [place] [address]    Report Status PENDING  Incomplete  Culture, blood (Routine X 2) w Reflex to ID Panel     Status: Abnormal (Preliminary result)   Collection Time: 12/17/20  3:29 PM   Specimen: Right Antecubital; Blood  Result Value Ref Range Status   Specimen Description   Final    RIGHT ANTECUBITAL Performed at [facility], [address], [place], [place] [address]    Special Requests   Final    BOTTLES DRAWN AEROBIC AND ANAEROBIC Blood Culture adequate volume Performed at [facility], [address] [place], [place] [address]    Culture  Setup Time    Final    GRAM POSITIVE COCCI IN CLUSTERS AEROBIC BOTTLE ONLY Gram Stain Report Called to,Read Back By and Verified With: DEAN,P AT [id number] ON 12/18/20 BY HUFFINES,S Performed at [place] ID to follow CRITICAL RESULT CALLED TO, READ BACK BY AND VERIFIED WITHHinton Dyer RN [id number] 12/18/20 A BROWNING    Culture (  A)  Final    STAPHYLOCOCCUS EPIDERMIDIS THE SIGNIFICANCE OF ISOLATING THIS ORGANISM FROM A SINGLE VENIPUNCTURE CANNOT BE PREDICTED WITHOUT FURTHER CLINICAL AND CULTURE CORRELATION. SUSCEPTIBILITIES AVAILABLE ONLY ON REQUEST. Performed at [facility], [place] [address] [place], [place] [address]    Report Status PENDING  Incomplete  Blood Culture ID Panel (Reflexed)     Status: Abnormal   Collection Time: 12/17/20  3:29 PM  Result Value Ref Range Status   Enterococcus faecalis NOT DETECTED NOT DETECTED Final   Enterococcus Faecium NOT DETECTED NOT DETECTED Final   Listeria monocytogenes NOT DETECTED NOT DETECTED Final   Staphylococcus species DETECTED (A) NOT DETECTED Final    Comment: CRITICAL RESULT CALLED TO, READ BACK BY AND VERIFIED WITHHinton Dyer RN 2141 12/18/20 A BROWNING    Staphylococcus aureus (BCID) NOT DETECTED NOT DETECTED Final   Staphylococcus epidermidis DETECTED (A) NOT DETECTED Final    Comment: CRITICAL RESULT CALLED TO, READ BACK BY AND VERIFIED WITHHinton Dyer RN 2141 12/18/20 A BROWNING    Staphylococcus lugdunensis NOT DETECTED NOT DETECTED Final   Streptococcus species NOT DETECTED NOT DETECTED Final   Streptococcus agalactiae NOT DETECTED NOT DETECTED Final   Streptococcus pneumoniae NOT DETECTED NOT DETECTED Final   Streptococcus pyogenes NOT DETECTED NOT DETECTED Final   A.calcoaceticus-baumannii NOT DETECTED NOT DETECTED Final   Bacteroides fragilis NOT DETECTED NOT DETECTED Final   Enterobacterales NOT DETECTED NOT DETECTED Final   Enterobacter cloacae complex NOT DETECTED NOT DETECTED Final   Escherichia coli NOT DETECTED NOT  DETECTED Final   Klebsiella aerogenes NOT DETECTED NOT DETECTED Final   Klebsiella oxytoca NOT DETECTED NOT DETECTED Final   Klebsiella pneumoniae NOT DETECTED NOT DETECTED Final   Proteus species NOT DETECTED NOT DETECTED Final   Salmonella species NOT DETECTED NOT DETECTED Final   Serratia marcescens NOT DETECTED NOT DETECTED Final   Haemophilus influenzae NOT DETECTED NOT DETECTED Final   Neisseria meningitidis NOT DETECTED NOT DETECTED Final   Pseudomonas aeruginosa NOT DETECTED NOT DETECTED Final   Stenotrophomonas maltophilia NOT DETECTED NOT DETECTED Final   Candida albicans NOT DETECTED NOT DETECTED Final   Candida auris NOT DETECTED NOT DETECTED Final   Candida glabrata NOT DETECTED NOT DETECTED Final   Candida krusei NOT DETECTED NOT DETECTED Final   Candida parapsilosis NOT DETECTED NOT DETECTED Final   Candida tropicalis NOT DETECTED NOT DETECTED Final   Cryptococcus neoformans/gattii NOT DETECTED NOT DETECTED Final   Methicillin resistance mecA/C NOT DETECTED NOT DETECTED Final    Comment: Performed at [facility], [address] [place], [place] [address]  Urine Culture     Status: Abnormal (Preliminary result)   Collection Time: 12/17/20  3:47 PM   Specimen: Urine, Catheterized  Result Value Ref Range Status   Specimen Description   Final    URINE, CATHETERIZED Performed at [facility], [address], [place], [place] [address]    Special Requests   Final    NONE Performed at [facility], [address], [place], [place] [address]    Culture (A)  Final    >=100,000 COLONIES/mL STAPHYLOCOCCUS EPIDERMIDIS SUSCEPTIBILITIES TO FOLLOW Performed at [facility], [place] [address], [place],  [address]    Report Status PENDING  Incomplete  Resp Panel by RT-PCR (Flu A&B, Covid) Nasopharyngeal Swab     Status: None   Collection Time: 12/17/20  5:37 PM   Specimen: Nasopharyngeal Swab; Nasopharyngeal(NP) swabs in vial transport medium  Result Value  Ref  Range Status   SARS Coronavirus 2 by RT PCR NEGATIVE NEGATIVE Final    Comment: (NOTE) SARS-CoV-2 target nucleic acids are NOT DETECTED.  The SARS-CoV-2 RNA is generally detectable in upper respiratory specimens during the acute phase of infection. The lowest concentration of SARS-CoV-2 viral copies this assay can detect is 138 copies/mL. A negative result does not preclude SARS-Cov-2 infection and should not be used as the sole basis for treatment or other patient management decisions. A negative result may occur with  improper specimen collection/handling, submission of specimen other than nasopharyngeal swab, presence of viral mutation(s) within the areas targeted by this assay, and inadequate number of viral copies(<138 copies/mL). A negative result must be combined with clinical observations, patient history, and epidemiological information. The expected result is Negative.  Fact Sheet for Patients:  EntrepreneurPulse.com.au  Fact Sheet for Healthcare Providers:  IncredibleEmployment.be  This test is no t yet approved or cleared by the [place] FDA and  has been authorized for detection and/or diagnosis of SARS-CoV-2 by FDA under an Emergency Use Authorization (EUA). This EUA will remain  in effect (meaning this test can be used) for the duration of the COVID-19 declaration under Section 564(b)(1) of the Act, 21 U.S.C.section 360bbb-3(b)(1), unless the authorization is terminated  or revoked sooner.       Influenza A by PCR NEGATIVE NEGATIVE Final   Influenza B by PCR NEGATIVE NEGATIVE Final    Comment: (NOTE) The Xpert Xpress SARS-CoV-2/FLU/RSV plus assay is intended as an aid in the diagnosis of influenza from Nasopharyngeal swab specimens and should not be used as a sole basis for treatment. Nasal washings and aspirates are unacceptable for Xpert Xpress SARS-CoV-2/FLU/RSV testing.  Fact Sheet for  Patients: EntrepreneurPulse.com.au  Fact Sheet for Healthcare Providers: IncredibleEmployment.be  This test is not yet approved or cleared by the [place] FDA and has been authorized for detection and/or diagnosis of SARS-CoV-2 by FDA under an Emergency Use Authorization (EUA). This EUA will remain in effect (meaning this test can be used) for the duration of the COVID-19 declaration under Section 564(b)(1) of the Act, 21 U.S.C. section 360bbb-3(b)(1), unless the authorization is terminated or revoked.  Performed at [facility], [address] [place], [place] [address]          Radiology Studies: CT Head Wo Contrast  Result Date: 12/17/2020 CLINICAL DATA:  Altered mental status EXAM: CT HEAD WITHOUT CONTRAST TECHNIQUE: Contiguous axial images were obtained from the base of the skull through the vertex without intravenous contrast. COMPARISON:  CT head 12/03/2003 FINDINGS: Brain: There is no acute intracranial hemorrhage, extra-axial fluid collection, or infarct. No mass lesion is identified. The ventricles are not enlarged. There is no midline shift. Vascular: No hyperdense vessel or unexpected calcification. Skull: Normal. Negative for fracture or focal lesion. Sinuses/Orbits: There is minimal mucosal thickening in the paranasal sinuses. Bilateral lens implants are noted. The orbits and globes are otherwise unremarkable. Other: There is trace fluid in the left mastoid air cells. Fluid is also seen in the left middle ear cavity. IMPRESSION: 1. No acute intracranial pathology. 2. Small left mastoid and middle ear effusions. Correlate with any symptoms. Electronically Signed   By: Valetta Mole MD   On: 12/17/2020 15:35   CT Cervical Spine Wo Contrast  Result Date: 12/17/2020 CLINICAL DATA:  Poly trauma EXAM: CT CERVICAL SPINE WITHOUT CONTRAST TECHNIQUE: Multidetector CT imaging of the cervical spine was performed without intravenous contrast.  Multiplanar CT image reconstructions were also generated.  COMPARISON:  Cervical spine radiographs 11/30/2003 FINDINGS: Alignment: There is straightening of the normal cervical spine lordosis. There is no anterior or retrolisthesis. Skull base and vertebrae: Vertebral body heights are preserved. There is no evidence of acute fracture. There is no suspicious osseous lesion. Soft tissues and spinal canal: No prevertebral fluid or swelling. No visible canal hematoma. Disc levels: There is intervertebral disc space narrowing with associated uncovertebral arthropathy at C5-C6 and C6-C7. The osseous spinal canal is patent. There is moderate to severe right neural foraminal stenosis at C5-C6. Upper chest: The lung apices are not imaged. Other: None. IMPRESSION: 1. No acute fracture or traumatic malalignment of the cervical spine. 2. Degenerative changes most advanced at C5-C6 where there is moderate to severe right neural foraminal stenosis. Electronically Signed   By: Valetta Mole MD   On: 12/17/2020 15:39   DG Chest Port 1 View  Result Date: 12/17/2020 CLINICAL DATA:  Altered mental status EXAM: PORTABLE CHEST 1 VIEW COMPARISON:  Chest radiograph 01/09/2015 FINDINGS: The heart is markedly enlarged. The mediastinal contours are otherwise within normal limits. There is pulmonary vascular congestion with likely mild pulmonary interstitial edema. There is probably a left pleural effusion. There is no definite right effusion. There is no pneumothorax. Retrocardiac opacity may reflect atelectasis or pneumonia. There is no acute osseous abnormality. IMPRESSION: 1. Marked cardiomegaly (progressed since 2016) with vascular congestion and probable mild pulmonary interstitial edema and a left pleural effusion. 2. Retrocardiac opacity may reflect atelectasis or pneumonia in the correct clinical setting. Electronically Signed   By: Valetta Mole MD   On: 12/17/2020 15:30        Scheduled Meds:  enoxaparin (LOVENOX) injection   90 mg Subcutaneous Q24H   fluticasone  1 spray Each Nare Daily   tamsulosin  0.4 mg Oral QHS   Continuous Infusions:  cefTRIAXone (ROCEPHIN)  IV 2 g (12/18/20 1655)     LOS: 2 days    Time spent: 35 minutes    Nilaya Bouie Darleen Crocker, DO [facility]  If 7PM-7AM, please contact night-coverage [email or web address] 12/19/2020, 1:57 PM

## 2020-12-20 DIAGNOSIS — G9341 Metabolic encephalopathy: Secondary | ICD-10-CM | POA: Diagnosis not present

## 2020-12-20 LAB — CBC
HCT: 30.6 % — ABNORMAL LOW (ref 39.0–52.0)
Hemoglobin: 9.6 g/dL — ABNORMAL LOW (ref 13.0–17.0)
MCH: 29.7 pg (ref 26.0–34.0)
MCHC: 31.4 g/dL (ref 30.0–36.0)
MCV: 94.7 fL (ref 80.0–100.0)
Platelets: 63 10*3/uL — ABNORMAL LOW (ref 150–400)
RBC: 3.23 MIL/uL — ABNORMAL LOW (ref 4.22–5.81)
RDW: 16.3 % — ABNORMAL HIGH (ref 11.5–15.5)
WBC: 4.2 10*3/uL (ref 4.0–10.5)
nRBC: 0 % (ref 0.0–0.2)

## 2020-12-20 LAB — CULTURE, BLOOD (ROUTINE X 2): Special Requests: ADEQUATE

## 2020-12-20 LAB — BASIC METABOLIC PANEL
Anion gap: 8 (ref 5–15)
BUN: 48 mg/dL — ABNORMAL HIGH (ref 8–23)
CO2: 26 mmol/L (ref 22–32)
Calcium: 8.2 mg/dL — ABNORMAL LOW (ref 8.9–10.3)
Chloride: 106 mmol/L (ref 98–111)
Creatinine, Ser: 2.75 mg/dL — ABNORMAL HIGH (ref 0.61–1.24)
GFR, Estimated: 25 mL/min — ABNORMAL LOW (ref >60)
Glucose, Bld: 99 mg/dL (ref 70–99)
Potassium: 4.4 mmol/L (ref 3.5–5.1)
Sodium: 140 mmol/L (ref 135–145)

## 2020-12-20 LAB — MAGNESIUM: Magnesium: 1.9 mg/dL (ref 1.7–2.4)

## 2020-12-20 LAB — URINE CULTURE: Culture: >=100000 — AB

## 2020-12-20 MED ORDER — HYDROCODONE-ACETAMINOPHEN 10-325 MG PO TABS: 1.0000 | ORAL_TABLET | ORAL | Status: DC | PRN

## 2020-12-20 MED ORDER — FERROUS GLUCONATE 324 (38 FE) MG PO TABS
324.0000 mg | ORAL_TABLET | Freq: Every day | ORAL | Status: DC
  Administered 2020-12-21 – 2020-12-28 (×8): 324 mg via ORAL
  Filled 2020-12-20 (×8): qty 1

## 2020-12-20 MED ORDER — HYDROMORPHONE HCL 1 MG/ML IJ SOLN
0.5000 mg | INTRAMUSCULAR | Status: DC | PRN
  Administered 2020-12-20 – 2020-12-27 (×37): 0.5 mg via INTRAVENOUS
  Filled 2020-12-20 (×39): qty 0.5

## 2020-12-20 MED ORDER — FOLIC ACID 1 MG PO TABS
1.0000 mg | ORAL_TABLET | Freq: Every day | ORAL | Status: DC
  Administered 2020-12-20 – 2020-12-28 (×9): 1 mg via ORAL
  Filled 2020-12-20 (×9): qty 1

## 2020-12-20 MED ORDER — GABAPENTIN 100 MG PO CAPS: 100.0000 mg | ORAL_CAPSULE | Freq: Two times a day (BID) | ORAL | Status: DC

## 2020-12-20 MED ORDER — LACTATED RINGERS IV SOLN: INTRAVENOUS | Status: DC

## 2020-12-20 NOTE — Progress Notes (Signed)
PROGRESS NOTE    Eddie Fletcher  [id number] DOB: 1955-05-13 DOA: 12/17/2020 PCP: Curly Rim, MD   Brief Narrative:   Eddie Fletcher is a 65 y.o. male with medical history significant for HTN, morbid obesity, sleep apnea. Patient was brought to the ED via EMS reports of altered mental status.  Patient apparently has tried for SNF placement in the past, but was denied multiple bed offers.  He has apparently had multiple falls at home to and has difficulty ambulating.  EMS had noted that he had pinpoint pupils and was given some Narcan with improvement in mentation noted.  His mentation has improved and PT evaluation recommending SNF.  AKI is slowly improving, but new baseline is unknown.  Assessment & Plan:   Principal Problem:   Acute metabolic encephalopathy Active Problems:   Apnea, sleep   Acute kidney injury superimposed on CKD (HCC)   Acute lower UTI   Morbid obesity with BMI of 60.0-69.9, adult (HCC)   Multiple falls   Acute metabolic encephalopathy-multifactorial-resolved -Likely a combination of UTI as well as narcotic medications in the setting of AKI -Blood and urine cultures with staph epidermis contaminant -Hold psychoactive agents to include gabapentin and Percocet -Patient now complaining of some back pain for which Dilaudid will be given as needed   UTI -Completed course of treatment with Rocephin -Urine culture with staph epidermidis which is likely contaminant   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 -Baseline creatinine uncertain and usually between 1.8-2.3 -BNP elevation noted, hold further IV fluid -Wean oxygen as tolerated -Creatinine level slightly improved   OSA -Does not wear CPAP at home, and refuses here   Chronic low back pain -MRI could not be performed due to patient's body habitus -EDP spoke with neurosurgery who suggested CT myelogram later on as an outpatient -Patient currently having some low back pain for which Dilaudid has been  ordered as needed   Multiple falls -PT evaluation recommending SNF   Morbid obesity     DVT prophylaxis: Lovenox Code Status: Full Family Communication: Discussed with wife on phone 8/11 Disposition Plan:  Status is: Inpatient   Remains inpatient appropriate because:Altered mental status, IV treatments appropriate due to intensity of illness or inability to take PO, and Inpatient level of care appropriate due to severity of illness   Dispo: The patient is from: Home              Anticipated d/c is to: SNF              Patient currently is not medically stable to d/c.              Difficult to place patient No   Consultants:  None   Procedures:  See below   Antimicrobials:  Anti-infectives (From admission, onward)    Start     Dose/Rate Route Frequency Ordered Stop   12/18/20 1700  cefTRIAXone (ROCEPHIN) 2 g in sodium chloride 0.9 % 100 mL IVPB  Status:  Discontinued        2 g 200 mL/hr over 30 Minutes Intravenous Every 24 hours 12/17/20 1943 12/20/20 1111   12/17/20 1930  cefTRIAXone (ROCEPHIN) 1 g in sodium chloride 0.9 % 100 mL IVPB       Note to Pharmacy: Totalling 2g, adjusted for weight.   1 g 200 mL/hr over 30 Minutes Intravenous  Once 12/17/20 1919 12/17/20 2045   12/17/20 1700  cefTRIAXone (ROCEPHIN) 1 g in sodium chloride 0.9 % 100 mL IVPB  1 g 200 mL/hr over 30 Minutes Intravenous  Once 12/17/20 1650 12/17/20 1759        Subjective: Patient seen and evaluated today with complaints of mild back pain. No acute concerns or events noted overnight.  Objective: Vitals:   12/19/20 0520 12/19/20 1317 12/19/20 2100 12/20/20 0544  BP: (!) 156/68 (!) 129/59 129/67 132/65  Pulse: 81 92 92 88  Resp:  '20 19 20  '$ Temp: 97.9 F (36.6 C) 98.9 F (37.2 C) 97.6 F (36.4 C) 98.6 F (37 C)  TempSrc:  Oral Oral Oral  SpO2: 97% 97% 95% 98%  Weight:      Height:        Intake/Output Summary (Last 24 hours) at 12/20/2020 1113 Last data filed at 12/20/2020  0900 Gross per 24 hour  Intake 600 ml  Output 1000 ml  Net -400 ml   Filed Weights   12/17/20 1341  Weight: (!) 182 kg    Examination:  General exam: Appears calm and comfortable  Respiratory system: Clear to auscultation. Respiratory effort normal. Cardiovascular system: S1 & S2 heard, RRR.  Gastrointestinal system: Abdomen is soft Central nervous system: Alert and awake Extremities: No edema Skin: No significant lesions noted Psychiatry: Flat affect.    Data Reviewed: I have personally reviewed following labs and imaging studies  CBC: Recent Labs  Lab 12/17/20 1529 12/18/20 0444 12/20/20 0903  WBC 10.2 7.2 4.2  NEUTROABS 8.5*  --   --   HGB 11.1* 9.5* 9.6*  HCT 34.8* 30.9* 30.6*  MCV 93.5 93.6 94.7  PLT 109* 93* 63*   Basic Metabolic Panel: Recent Labs  Lab 12/17/20 1529 12/18/20 0444 12/20/20 0633  NA 139 141 140  K 4.5 4.8 4.4  CL 107 111 106  CO2 '23 24 26  '$ GLUCOSE 89 85 99  BUN 58* 58* 48*  CREATININE 3.40* 3.12* 2.75*  CALCIUM 8.4* 8.3* 8.2*  MG  --   --  1.9   GFR: Estimated Creatinine Clearance: 43.2 mL/min (A) (by C-G formula based on SCr of 2.75 mg/dL (H)). Liver Function Tests: Recent Labs  Lab 12/17/20 1529  AST 31  ALT 24  ALKPHOS 113  BILITOT 1.1  PROT 7.3  ALBUMIN 2.5*   No results for input(s): LIPASE, AMYLASE in the last 168 hours. No results for input(s): AMMONIA in the last 168 hours. Coagulation Profile: No results for input(s): INR, PROTIME in the last 168 hours. Cardiac Enzymes: No results for input(s): CKTOTAL, CKMB, CKMBINDEX, TROPONINI in the last 168 hours. BNP (last 3 results) No results for input(s): PROBNP in the last 8760 hours. HbA1C: No results for input(s): HGBA1C in the last 72 hours. CBG: Recent Labs  Lab 12/17/20 2132 12/18/20 0716 12/18/20 1119 12/18/20 1557 12/19/20 0715  GLUCAP 91 72 137* 141* 131*   Lipid Profile: No results for input(s): CHOL, HDL, LDLCALC, TRIG, CHOLHDL, LDLDIRECT in the  last 72 hours. Thyroid Function Tests: No results for input(s): TSH, T4TOTAL, FREET4, T3FREE, THYROIDAB in the last 72 hours. Anemia Panel: No results for input(s): VITAMINB12, FOLATE, FERRITIN, TIBC, IRON, RETICCTPCT in the last 72 hours. Sepsis Labs: Recent Labs  Lab 12/17/20 1529 12/17/20 2009  LATICACIDVEN 2.0* 2.1*    Recent Results (from the past 240 hour(s))  Resp Panel by RT-PCR (Flu A&B, Covid) Nasopharyngeal Swab     Status: None   Collection Time: 12/12/20  4:03 PM   Specimen: Nasopharyngeal Swab; Nasopharyngeal(NP) swabs in vial transport medium  Result Value Ref Range Status  SARS Coronavirus 2 by RT PCR NEGATIVE NEGATIVE Final    Comment: (NOTE) SARS-CoV-2 target nucleic acids are NOT DETECTED.  The SARS-CoV-2 RNA is generally detectable in upper respiratory specimens during the acute phase of infection. The lowest concentration of SARS-CoV-2 viral copies this assay can detect is 138 copies/mL. A negative result does not preclude SARS-Cov-2 infection and should not be used as the sole basis for treatment or other patient management decisions. A negative result may occur with  improper specimen collection/handling, submission of specimen other than nasopharyngeal swab, presence of viral mutation(s) within the areas targeted by this assay, and inadequate number of viral copies(<138 copies/mL). A negative result must be combined with clinical observations, patient history, and epidemiological information. The expected result is Negative.  Fact Sheet for Patients:  EntrepreneurPulse.com.au  Fact Sheet for Healthcare Providers:  IncredibleEmployment.be  This test is no t yet approved or cleared by the [place] FDA and  has been authorized for detection and/or diagnosis of SARS-CoV-2 by FDA under an Emergency Use Authorization (EUA). This EUA will remain  in effect (meaning this test can be used) for the duration of  the COVID-19 declaration under Section 564(b)(1) of the Act, 21 U.S.C.section 360bbb-3(b)(1), unless the authorization is terminated  or revoked sooner.       Influenza A by PCR NEGATIVE NEGATIVE Final   Influenza B by PCR NEGATIVE NEGATIVE Final    Comment: (NOTE) The Xpert Xpress SARS-CoV-2/FLU/RSV plus assay is intended as an aid in the diagnosis of influenza from Nasopharyngeal swab specimens and should not be used as a sole basis for treatment. Nasal washings and aspirates are unacceptable for Xpert Xpress SARS-CoV-2/FLU/RSV testing.  Fact Sheet for Patients: EntrepreneurPulse.com.au  Fact Sheet for Healthcare Providers: IncredibleEmployment.be  This test is not yet approved or cleared by the [place] FDA and has been authorized for detection and/or diagnosis of SARS-CoV-2 by FDA under an Emergency Use Authorization (EUA). This EUA will remain in effect (meaning this test can be used) for the duration of the COVID-19 declaration under Section 564(b)(1) of the Act, 21 U.S.C. section 360bbb-3(b)(1), unless the authorization is terminated or revoked.  Performed at [facility], [address], [place], [place] [address]   Culture, blood (Routine X 2) w Reflex to ID Panel     Status: None (Preliminary result)   Collection Time: 12/17/20  3:29 PM   Specimen: BLOOD LEFT ARM  Result Value Ref Range Status   Specimen Description BLOOD LEFT ARM  Final   Special Requests   Final    BOTTLES DRAWN AEROBIC AND ANAEROBIC Blood Culture results may not be optimal due to an excessive volume of blood received in culture bottles   Culture   Final    NO GROWTH 3 DAYS Performed at [facility], [address], [place], [place] [address]    Report Status PENDING  Incomplete  Culture, blood (Routine X 2) w Reflex to ID Panel     Status: Abnormal (Preliminary result)   Collection Time: 12/17/20  3:29 PM   Specimen: Right Antecubital; Blood  Result  Value Ref Range Status   Specimen Description   Final    RIGHT ANTECUBITAL Performed at [facility], [address] [place], [place] [address]    Special Requests   Final    BOTTLES DRAWN AEROBIC AND ANAEROBIC Blood Culture adequate volume Performed at [facility], [address], [place],  [address]    Culture  Setup Time   Final  GRAM POSITIVE COCCI IN CLUSTERS AEROBIC BOTTLE ONLY Gram Stain Report Called to,Read Back By and Verified With: DEAN,P AT [id number] ON 12/18/20 BY HUFFINES,S Performed at [facility] ID to follow CRITICAL RESULT CALLED TO, READ BACK BY AND VERIFIED WITHHinton Dyer RN 2141 12/18/20 A BROWNING    Culture (A)  Final    STAPHYLOCOCCUS EPIDERMIDIS THE SIGNIFICANCE OF ISOLATING THIS ORGANISM FROM A SINGLE VENIPUNCTURE CANNOT BE PREDICTED WITHOUT FURTHER CLINICAL AND CULTURE CORRELATION. SUSCEPTIBILITIES AVAILABLE ONLY ON REQUEST. Performed at [facility], [place] [address] [place], [place] [address]    Report Status PENDING  Incomplete  Blood Culture ID Panel (Reflexed)     Status: Abnormal   Collection Time: 12/17/20  3:29 PM  Result Value Ref Range Status   Enterococcus faecalis NOT DETECTED NOT DETECTED Final   Enterococcus Faecium NOT DETECTED NOT DETECTED Final   Listeria monocytogenes NOT DETECTED NOT DETECTED Final   Staphylococcus species DETECTED (A) NOT DETECTED Final    Comment: CRITICAL RESULT CALLED TO, READ BACK BY AND VERIFIED WITHHinton Dyer RN 2141 12/18/20 A BROWNING    Staphylococcus aureus (BCID) NOT DETECTED NOT DETECTED Final   Staphylococcus epidermidis DETECTED (A) NOT DETECTED Final    Comment: CRITICAL RESULT CALLED TO, READ BACK BY AND VERIFIED WITHHinton Dyer RN 2141 12/18/20 A BROWNING    Staphylococcus lugdunensis NOT DETECTED NOT DETECTED Final   Streptococcus species NOT DETECTED NOT DETECTED Final   Streptococcus agalactiae NOT DETECTED NOT DETECTED Final   Streptococcus pneumoniae NOT DETECTED NOT  DETECTED Final   Streptococcus pyogenes NOT DETECTED NOT DETECTED Final   A.calcoaceticus-baumannii NOT DETECTED NOT DETECTED Final   Bacteroides fragilis NOT DETECTED NOT DETECTED Final   Enterobacterales NOT DETECTED NOT DETECTED Final   Enterobacter cloacae complex NOT DETECTED NOT DETECTED Final   Escherichia coli NOT DETECTED NOT DETECTED Final   Klebsiella aerogenes NOT DETECTED NOT DETECTED Final   Klebsiella oxytoca NOT DETECTED NOT DETECTED Final   Klebsiella pneumoniae NOT DETECTED NOT DETECTED Final   Proteus species NOT DETECTED NOT DETECTED Final   Salmonella species NOT DETECTED NOT DETECTED Final   Serratia marcescens NOT DETECTED NOT DETECTED Final   Haemophilus influenzae NOT DETECTED NOT DETECTED Final   Neisseria meningitidis NOT DETECTED NOT DETECTED Final   Pseudomonas aeruginosa NOT DETECTED NOT DETECTED Final   Stenotrophomonas maltophilia NOT DETECTED NOT DETECTED Final   Candida albicans NOT DETECTED NOT DETECTED Final   Candida auris NOT DETECTED NOT DETECTED Final   Candida glabrata NOT DETECTED NOT DETECTED Final   Candida krusei NOT DETECTED NOT DETECTED Final   Candida parapsilosis NOT DETECTED NOT DETECTED Final   Candida tropicalis NOT DETECTED NOT DETECTED Final   Cryptococcus neoformans/gattii NOT DETECTED NOT DETECTED Final   Methicillin resistance mecA/C NOT DETECTED NOT DETECTED Final    Comment: Performed at [facility], [address] [place], [place] [address]  Urine Culture     Status: Abnormal   Collection Time: 12/17/20  3:47 PM   Specimen: Urine, Catheterized  Result Value Ref Range Status   Specimen Description   Final    URINE, CATHETERIZED Performed at [facility], [address] [place], [place] [address]    Special Requests   Final    NONE Performed at [facility], [address] [place], [place] [address]    Culture >=100,000 COLONIES/mL STAPHYLOCOCCUS EPIDERMIDIS (A)  Final   Report Status 12/20/2020 FINAL  Final    Organism  ID, Bacteria STAPHYLOCOCCUS EPIDERMIDIS (A)  Final      Susceptibility   Staphylococcus epidermidis - MIC*    CIPROFLOXACIN >=8 RESISTANT Resistant     GENTAMICIN <=0.5 SENSITIVE Sensitive     NITROFURANTOIN <=16 SENSITIVE Sensitive     OXACILLIN <=0.25 SENSITIVE Sensitive     TETRACYCLINE >=16 RESISTANT Resistant     VANCOMYCIN 1 SENSITIVE Sensitive     TRIMETH/SULFA 20 SENSITIVE Sensitive     CLINDAMYCIN >=8 RESISTANT Resistant     RIFAMPIN <=0.5 SENSITIVE Sensitive     Inducible Clindamycin NEGATIVE Sensitive     * >=100,000 COLONIES/mL STAPHYLOCOCCUS EPIDERMIDIS  Resp Panel by RT-PCR (Flu A&B, Covid) Nasopharyngeal Swab     Status: None   Collection Time: 12/17/20  5:37 PM   Specimen: Nasopharyngeal Swab; Nasopharyngeal(NP) swabs in vial transport medium  Result Value Ref Range Status   SARS Coronavirus 2 by RT PCR NEGATIVE NEGATIVE Final    Comment: (NOTE) SARS-CoV-2 target nucleic acids are NOT DETECTED.  The SARS-CoV-2 RNA is generally detectable in upper respiratory specimens during the acute phase of infection. The lowest concentration of SARS-CoV-2 viral copies this assay can detect is 138 copies/mL. A negative result does not preclude SARS-Cov-2 infection and should not be used as the sole basis for treatment or other patient management decisions. A negative result may occur with  improper specimen collection/handling, submission of specimen other than nasopharyngeal swab, presence of viral mutation(s) within the areas targeted by this assay, and inadequate number of viral copies(<138 copies/mL). A negative result must be combined with clinical observations, patient history, and epidemiological information. The expected result is Negative.  Fact Sheet for Patients:  EntrepreneurPulse.com.au  Fact Sheet for Healthcare Providers:  IncredibleEmployment.be  This test is no t yet approved or cleared by the [place] FDA  and  has been authorized for detection and/or diagnosis of SARS-CoV-2 by FDA under an Emergency Use Authorization (EUA). This EUA will remain  in effect (meaning this test can be used) for the duration of the COVID-19 declaration under Section 564(b)(1) of the Act, 21 U.S.C.section 360bbb-3(b)(1), unless the authorization is terminated  or revoked sooner.       Influenza A by PCR NEGATIVE NEGATIVE Final   Influenza B by PCR NEGATIVE NEGATIVE Final    Comment: (NOTE) The Xpert Xpress SARS-CoV-2/FLU/RSV plus assay is intended as an aid in the diagnosis of influenza from Nasopharyngeal swab specimens and should not be used as a sole basis for treatment. Nasal washings and aspirates are unacceptable for Xpert Xpress SARS-CoV-2/FLU/RSV testing.  Fact Sheet for Patients: EntrepreneurPulse.com.au  Fact Sheet for Healthcare Providers: IncredibleEmployment.be  This test is not yet approved or cleared by the [place] FDA and has been authorized for detection and/or diagnosis of SARS-CoV-2 by FDA under an Emergency Use Authorization (EUA). This EUA will remain in effect (meaning this test can be used) for the duration of the COVID-19 declaration under Section 564(b)(1) of the Act, 21 U.S.C. section 360bbb-3(b)(1), unless the authorization is terminated or revoked.  Performed at [facility], [address] [place], [place] [address]          Radiology Studies: No results found.      Scheduled Meds:  enoxaparin (LOVENOX) injection  90 mg Subcutaneous Q24H   [START ON 12/21/2020] ferrous gluconate  324 mg Oral Q breakfast   fluticasone  1 spray Each Nare Daily   folic acid  1 mg Oral Daily   gabapentin  100 mg Oral BID  tamsulosin  0.4 mg Oral QHS   Continuous Infusions:  lactated ringers       LOS: 3 days    Time spent: 35 minutes    Shanyla Marconi Darleen Crocker, DO [facility]  If 7PM-7AM, please contact  night-coverage [email or web address] 12/20/2020, 11:13 AM

## 2020-12-20 NOTE — TOC Progression Note (Signed)
Transition of Care [facility]) - Progression Note    Patient Details  Name: Eddie Fletcher MRN: [id number] Date of Birth: 1955/12/08  [facility] CM/SW Contact  Natasha Bence, LCSW Phone Number: 12/20/2020, 5:25 PM  Clinical Narrative:    CSW contacted Debbie with [employer] to inquire if auth received. No auth received. TOC to follow.   Expected Discharge Plan: Skilled Nursing Facility Barriers to Discharge: Barriers Resolved  Expected Discharge Plan and Services Expected Discharge Plan: [place]                                               Social Determinants of Health (SDOH) Interventions    Readmission Risk Interventions No flowsheet data found.

## 2020-12-21 DIAGNOSIS — G9341 Metabolic encephalopathy: Secondary | ICD-10-CM | POA: Diagnosis not present

## 2020-12-21 LAB — BASIC METABOLIC PANEL
Anion gap: 7 (ref 5–15)
BUN: 43 mg/dL — ABNORMAL HIGH (ref 8–23)
CO2: 26 mmol/L (ref 22–32)
Calcium: 8.1 mg/dL — ABNORMAL LOW (ref 8.9–10.3)
Chloride: 105 mmol/L (ref 98–111)
Creatinine, Ser: 2.53 mg/dL — ABNORMAL HIGH (ref 0.61–1.24)
GFR, Estimated: 28 mL/min — ABNORMAL LOW (ref >60)
Glucose, Bld: 116 mg/dL — ABNORMAL HIGH (ref 70–99)
Potassium: 4.3 mmol/L (ref 3.5–5.1)
Sodium: 138 mmol/L (ref 135–145)

## 2020-12-21 LAB — GLUCOSE, CAPILLARY: Glucose-Capillary: 111 mg/dL — ABNORMAL HIGH (ref 70–99)

## 2020-12-21 MED ORDER — DIPHENHYDRAMINE HCL 25 MG PO CAPS
25.0000 mg | ORAL_CAPSULE | Freq: Four times a day (QID) | ORAL | Status: DC | PRN
  Administered 2020-12-21 – 2020-12-26 (×10): 25 mg via ORAL
  Filled 2020-12-21 (×10): qty 1

## 2020-12-21 MED ORDER — NYSTATIN 100000 UNIT/GM EX POWD
Freq: Two times a day (BID) | CUTANEOUS | Status: DC
  Filled 2020-12-21 (×4): qty 15

## 2020-12-21 NOTE — TOC Progression Note (Signed)
Transition of Care [facility]) - Progression Note    Patient Details  Name: Eddie Fletcher MRN: [id number] Date of Birth: 03/16/1956  Transition [facility]) CM/SW Contact  Natasha Bence, LCSW Phone Number: 12/21/2020, 1:36 PM  Clinical Narrative:    Jackelyn Poling with [employer] reported no auth received TOC to follow.   Expected Discharge Plan: Skilled Nursing Facility Barriers to Discharge: Barriers Resolved  Expected Discharge Plan and Services Expected Discharge Plan: [place]                                               Social Determinants of Health (SDOH) Interventions    Readmission Risk Interventions No flowsheet data found.

## 2020-12-21 NOTE — Progress Notes (Signed)
PROGRESS NOTE    [name]  [id number] DOB: Jul 07, 1955 DOA: 12/17/2020 PCP: Curly Rim, MD   Brief Narrative:   Eddie Fletcher is a 65 y.o. male with medical history significant for HTN, morbid obesity, sleep apnea. Patient was brought to the ED via EMS reports of altered mental status.  Patient apparently has tried for SNF placement in the past, but was denied multiple bed offers.  He has apparently had multiple falls at home to and has difficulty ambulating.  EMS had noted that he had pinpoint pupils and was given some Narcan with improvement in mentation noted.  His mentation has improved and PT evaluation recommending SNF.  AKI is slowly improving with baseline near 2.  Assessment & Plan:   Principal Problem:   Acute metabolic encephalopathy Active Problems:   Apnea, sleep   Acute kidney injury superimposed on CKD (HCC)   Acute lower UTI   Morbid obesity with BMI of 60.0-69.9, adult (HCC)   Multiple falls   Acute metabolic encephalopathy-multifactorial-resolved -Likely a combination of UTI as well as narcotic medications in the setting of AKI -Blood and urine cultures with staph epidermis contaminant -Hold psychoactive agents to include gabapentin and Percocet -Patient now complaining of some back pain for which Dilaudid will be given as needed   UTI -Completed course of treatment with Rocephin -Urine culture with staph epidermidis which is likely contaminant   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 -Baseline creatinine uncertain and usually between 1.8-2.3 -BNP elevation noted, hold further IV fluid -Wean oxygen as tolerated -Creatinine level improving   OSA -Does not wear CPAP at home, and refuses here   Chronic low back pain -MRI could not be performed due to patient's body habitus -EDP spoke with neurosurgery who suggested CT myelogram later on as an outpatient -Patient currently having some low back pain for which Dilaudid has been ordered as  needed   Multiple falls -PT evaluation recommending SNF   Morbid obesity     DVT prophylaxis: Lovenox Code Status: Full Family Communication: Discussed with wife on phone 8/11 Disposition Plan:  Status is: Inpatient   Remains inpatient appropriate because:Altered mental status, IV treatments appropriate due to intensity of illness or inability to take PO, and Inpatient level of care appropriate due to severity of illness   Dispo: The patient is from: Home              Anticipated d/c is to: SNF              Patient currently is not medically stable to d/c.              Difficult to place patient No   Consultants:  None   Procedures:  See below  Antimicrobials:  Anti-infectives (From admission, onward)    Start     Dose/Rate Route Frequency Ordered Stop   12/18/20 1700  cefTRIAXone (ROCEPHIN) 2 g in sodium chloride 0.9 % 100 mL IVPB  Status:  Discontinued        2 g 200 mL/hr over 30 Minutes Intravenous Every 24 hours 12/17/20 1943 12/20/20 1111   12/17/20 1930  cefTRIAXone (ROCEPHIN) 1 g in sodium chloride 0.9 % 100 mL IVPB       Note to Pharmacy: Totalling 2g, adjusted for weight.   1 g 200 mL/hr over 30 Minutes Intravenous  Once 12/17/20 1919 12/17/20 2045   12/17/20 1700  cefTRIAXone (ROCEPHIN) 1 g in sodium chloride 0.9 % 100 mL IVPB  1 g 200 mL/hr over 30 Minutes Intravenous  Once 12/17/20 1650 12/17/20 1759       Subjective: Patient seen and evaluated today with complaints of itching overnight.  Objective: Vitals:   12/20/20 0544 12/20/20 1401 12/20/20 2105 12/21/20 0638  BP: 132/65 103/76 140/62 137/71  Pulse: 88 94 98 93  Resp: '20 18 20 20  '$ Temp: 98.6 F (37 C) 98.2 F (36.8 C) 98.2 F (36.8 C) (!) 97.3 F (36.3 C)  TempSrc: Oral  Oral   SpO2: 98% 97% 99% 100%  Weight:      Height:        Intake/Output Summary (Last 24 hours) at 12/21/2020 1130 Last data filed at 12/21/2020 0900 Gross per 24 hour  Intake 840 ml  Output 600 ml  Net 240  ml   Filed Weights   12/17/20 1341  Weight: (!) 182 kg    Examination:  General exam: Appears calm and comfortable, morbidly obese Respiratory system: Clear to auscultation. Respiratory effort normal. Cardiovascular system: S1 & S2 heard, RRR.  Gastrointestinal system: Abdomen is soft Central nervous system: Alert and awake Extremities: No edema Skin: No significant lesions noted Psychiatry: Flat affect.    Data Reviewed: I have personally reviewed following labs and imaging studies  CBC: Recent Labs  Lab 12/17/20 1529 12/18/20 0444 12/20/20 0903  WBC 10.2 7.2 4.2  NEUTROABS 8.5*  --   --   HGB 11.1* 9.5* 9.6*  HCT 34.8* 30.9* 30.6*  MCV 93.5 93.6 94.7  PLT 109* 93* 63*   Basic Metabolic Panel: Recent Labs  Lab 12/17/20 1529 12/18/20 0444 12/20/20 0633 12/21/20 0445  NA 139 141 140 138  K 4.5 4.8 4.4 4.3  CL 107 111 106 105  CO2 '23 24 26 26  '$ GLUCOSE 89 85 99 116*  BUN 58* 58* 48* 43*  CREATININE 3.40* 3.12* 2.75* 2.53*  CALCIUM 8.4* 8.3* 8.2* 8.1*  MG  --   --  1.9  --    GFR: Estimated Creatinine Clearance: 46.9 mL/min (A) (by C-G formula based on SCr of 2.53 mg/dL (H)). Liver Function Tests: Recent Labs  Lab 12/17/20 1529  AST 31  ALT 24  ALKPHOS 113  BILITOT 1.1  PROT 7.3  ALBUMIN 2.5*   No results for input(s): LIPASE, AMYLASE in the last 168 hours. No results for input(s): AMMONIA in the last 168 hours. Coagulation Profile: No results for input(s): INR, PROTIME in the last 168 hours. Cardiac Enzymes: No results for input(s): CKTOTAL, CKMB, CKMBINDEX, TROPONINI in the last 168 hours. BNP (last 3 results) No results for input(s): PROBNP in the last 8760 hours. HbA1C: No results for input(s): HGBA1C in the last 72 hours. CBG: Recent Labs  Lab 12/18/20 0716 12/18/20 1119 12/18/20 1557 12/19/20 0715 12/21/20 0715  GLUCAP 72 137* 141* 131* 111*   Lipid Profile: No results for input(s): CHOL, HDL, LDLCALC, TRIG, CHOLHDL, LDLDIRECT in  the last 72 hours. Thyroid Function Tests: No results for input(s): TSH, T4TOTAL, FREET4, T3FREE, THYROIDAB in the last 72 hours. Anemia Panel: No results for input(s): VITAMINB12, FOLATE, FERRITIN, TIBC, IRON, RETICCTPCT in the last 72 hours. Sepsis Labs: Recent Labs  Lab 12/17/20 1529 12/17/20 2009  LATICACIDVEN 2.0* 2.1*    Recent Results (from the past 240 hour(s))  Resp Panel by RT-PCR (Flu A&B, Covid) Nasopharyngeal Swab     Status: None   Collection Time: 12/12/20  4:03 PM   Specimen: Nasopharyngeal Swab; Nasopharyngeal(NP) swabs in vial transport medium  Result Value  Ref Range Status   SARS Coronavirus 2 by RT PCR NEGATIVE NEGATIVE Final    Comment: (NOTE) SARS-CoV-2 target nucleic acids are NOT DETECTED.  The SARS-CoV-2 RNA is generally detectable in upper respiratory specimens during the acute phase of infection. The lowest concentration of SARS-CoV-2 viral copies this assay can detect is 138 copies/mL. A negative result does not preclude SARS-Cov-2 infection and should not be used as the sole basis for treatment or other patient management decisions. A negative result may occur with  improper specimen collection/handling, submission of specimen other than nasopharyngeal swab, presence of viral mutation(s) within the areas targeted by this assay, and inadequate number of viral copies(<138 copies/mL). A negative result must be combined with clinical observations, patient history, and epidemiological information. The expected result is Negative.  Fact Sheet for Patients:  EntrepreneurPulse.com.au  Fact Sheet for Healthcare Providers:  IncredibleEmployment.be  This test is no t yet approved or cleared by the [place] FDA and  has been authorized for detection and/or diagnosis of SARS-CoV-2 by FDA under an Emergency Use Authorization (EUA). This EUA will remain  in effect (meaning this test can be used) for the duration of  the COVID-19 declaration under Section 564(b)(1) of the Act, 21 U.S.C.section 360bbb-3(b)(1), unless the authorization is terminated  or revoked sooner.       Influenza A by PCR NEGATIVE NEGATIVE Final   Influenza B by PCR NEGATIVE NEGATIVE Final    Comment: (NOTE) The Xpert Xpress SARS-CoV-2/FLU/RSV plus assay is intended as an aid in the diagnosis of influenza from Nasopharyngeal swab specimens and should not be used as a sole basis for treatment. Nasal washings and aspirates are unacceptable for Xpert Xpress SARS-CoV-2/FLU/RSV testing.  Fact Sheet for Patients: EntrepreneurPulse.com.au  Fact Sheet for Healthcare Providers: IncredibleEmployment.be  This test is not yet approved or cleared by the [place] FDA and has been authorized for detection and/or diagnosis of SARS-CoV-2 by FDA under an Emergency Use Authorization (EUA). This EUA will remain in effect (meaning this test can be used) for the duration of the COVID-19 declaration under Section 564(b)(1) of the Act, 21 U.S.C. section 360bbb-3(b)(1), unless the authorization is terminated or revoked.  Performed at [facility], [address], [place], [place] [address]   Culture, blood (Routine X 2) w Reflex to ID Panel     Status: None (Preliminary result)   Collection Time: 12/17/20  3:29 PM   Specimen: BLOOD LEFT ARM  Result Value Ref Range Status   Specimen Description BLOOD LEFT ARM  Final   Special Requests   Final    BOTTLES DRAWN AEROBIC AND ANAEROBIC Blood Culture results may not be optimal due to an excessive volume of blood received in culture bottles   Culture   Final    NO GROWTH 3 DAYS Performed at [facility], [address] [place], [place] [address]    Report Status PENDING  Incomplete  Culture, blood (Routine X 2) w Reflex to ID Panel     Status: Abnormal   Collection Time: 12/17/20  3:29 PM   Specimen: Right Antecubital; Blood  Result Value Ref Range Status    Specimen Description   Final    RIGHT ANTECUBITAL Performed at [facility], [address], [place], [place] [address]    Special Requests   Final    BOTTLES DRAWN AEROBIC AND ANAEROBIC Blood Culture adequate volume Performed at [facility], [address], [place], [place] [address]    Culture  Setup Time  Final    GRAM POSITIVE COCCI IN CLUSTERS AEROBIC BOTTLE ONLY Gram Stain Report Called to,Read Back By and Verified With: DEAN,P AT [id number] ON 12/18/20 BY HUFFINES,S Performed at [place] ID to follow CRITICAL RESULT CALLED TO, READ BACK BY AND VERIFIED WITHHinton Dyer RN 2141 12/18/20 A BROWNING    Culture (A)  Final    STAPHYLOCOCCUS EPIDERMIDIS THE SIGNIFICANCE OF ISOLATING THIS ORGANISM FROM A SINGLE VENIPUNCTURE CANNOT BE PREDICTED WITHOUT FURTHER CLINICAL AND CULTURE CORRELATION. SUSCEPTIBILITIES AVAILABLE ONLY ON REQUEST. Performed at [facility], [place] [address] [place], [place] [address]    Report Status 12/20/2020 FINAL  Final  Blood Culture ID Panel (Reflexed)     Status: Abnormal   Collection Time: 12/17/20  3:29 PM  Result Value Ref Range Status   Enterococcus faecalis NOT DETECTED NOT DETECTED Final   Enterococcus Faecium NOT DETECTED NOT DETECTED Final   Listeria monocytogenes NOT DETECTED NOT DETECTED Final   Staphylococcus species DETECTED (A) NOT DETECTED Final    Comment: CRITICAL RESULT CALLED TO, READ BACK BY AND VERIFIED WITHHinton Dyer RN 2141 12/18/20 A BROWNING    Staphylococcus aureus (BCID) NOT DETECTED NOT DETECTED Final   Staphylococcus epidermidis DETECTED (A) NOT DETECTED Final    Comment: CRITICAL RESULT CALLED TO, READ BACK BY AND VERIFIED WITHHinton Dyer RN 2141 12/18/20 A BROWNING    Staphylococcus lugdunensis NOT DETECTED NOT DETECTED Final   Streptococcus species NOT DETECTED NOT DETECTED Final   Streptococcus agalactiae NOT DETECTED NOT DETECTED Final   Streptococcus pneumoniae NOT DETECTED NOT DETECTED Final    Streptococcus pyogenes NOT DETECTED NOT DETECTED Final   A.calcoaceticus-baumannii NOT DETECTED NOT DETECTED Final   Bacteroides fragilis NOT DETECTED NOT DETECTED Final   Enterobacterales NOT DETECTED NOT DETECTED Final   Enterobacter cloacae complex NOT DETECTED NOT DETECTED Final   Escherichia coli NOT DETECTED NOT DETECTED Final   Klebsiella aerogenes NOT DETECTED NOT DETECTED Final   Klebsiella oxytoca NOT DETECTED NOT DETECTED Final   Klebsiella pneumoniae NOT DETECTED NOT DETECTED Final   Proteus species NOT DETECTED NOT DETECTED Final   Salmonella species NOT DETECTED NOT DETECTED Final   Serratia marcescens NOT DETECTED NOT DETECTED Final   Haemophilus influenzae NOT DETECTED NOT DETECTED Final   Neisseria meningitidis NOT DETECTED NOT DETECTED Final   Pseudomonas aeruginosa NOT DETECTED NOT DETECTED Final   Stenotrophomonas maltophilia NOT DETECTED NOT DETECTED Final   Candida albicans NOT DETECTED NOT DETECTED Final   Candida auris NOT DETECTED NOT DETECTED Final   Candida glabrata NOT DETECTED NOT DETECTED Final   Candida krusei NOT DETECTED NOT DETECTED Final   Candida parapsilosis NOT DETECTED NOT DETECTED Final   Candida tropicalis NOT DETECTED NOT DETECTED Final   Cryptococcus neoformans/gattii NOT DETECTED NOT DETECTED Final   Methicillin resistance mecA/C NOT DETECTED NOT DETECTED Final    Comment: Performed at [facility], [address] [place], [place] [address]  Urine Culture     Status: Abnormal   Collection Time: 12/17/20  3:47 PM   Specimen: Urine, Catheterized  Result Value Ref Range Status   Specimen Description   Final    URINE, CATHETERIZED Performed at [facility], [address] [place], [place] [address]    Special Requests   Final    NONE Performed at [facility], [address] [place], [place] [address]    Culture >=100,000 COLONIES/mL STAPHYLOCOCCUS EPIDERMIDIS (A)  Final   Report Status 12/20/2020 FINAL  Final   Organism ID,  Bacteria STAPHYLOCOCCUS EPIDERMIDIS (A)  Final      Susceptibility   Staphylococcus epidermidis - MIC*    CIPROFLOXACIN >=8 RESISTANT Resistant     GENTAMICIN <=0.5 SENSITIVE Sensitive     NITROFURANTOIN <=16 SENSITIVE Sensitive     OXACILLIN <=0.25 SENSITIVE Sensitive     TETRACYCLINE >=16 RESISTANT Resistant     VANCOMYCIN 1 SENSITIVE Sensitive     TRIMETH/SULFA 20 SENSITIVE Sensitive     CLINDAMYCIN >=8 RESISTANT Resistant     RIFAMPIN <=0.5 SENSITIVE Sensitive     Inducible Clindamycin NEGATIVE Sensitive     * >=100,000 COLONIES/mL STAPHYLOCOCCUS EPIDERMIDIS  Resp Panel by RT-PCR (Flu A&B, Covid) Nasopharyngeal Swab     Status: None   Collection Time: 12/17/20  5:37 PM   Specimen: Nasopharyngeal Swab; Nasopharyngeal(NP) swabs in vial transport medium  Result Value Ref Range Status   SARS Coronavirus 2 by RT PCR NEGATIVE NEGATIVE Final    Comment: (NOTE) SARS-CoV-2 target nucleic acids are NOT DETECTED.  The SARS-CoV-2 RNA is generally detectable in upper respiratory specimens during the acute phase of infection. The lowest concentration of SARS-CoV-2 viral copies this assay can detect is 138 copies/mL. A negative result does not preclude SARS-Cov-2 infection and should not be used as the sole basis for treatment or other patient management decisions. A negative result may occur with  improper specimen collection/handling, submission of specimen other than nasopharyngeal swab, presence of viral mutation(s) within the areas targeted by this assay, and inadequate number of viral copies(<138 copies/mL). A negative result must be combined with clinical observations, patient history, and epidemiological information. The expected result is Negative.  Fact Sheet for Patients:  EntrepreneurPulse.com.au  Fact Sheet for Healthcare Providers:  IncredibleEmployment.be  This test is no t yet approved or cleared by the [place] FDA and  has been  authorized for detection and/or diagnosis of SARS-CoV-2 by FDA under an Emergency Use Authorization (EUA). This EUA will remain  in effect (meaning this test can be used) for the duration of the COVID-19 declaration under Section 564(b)(1) of the Act, 21 U.S.C.section 360bbb-3(b)(1), unless the authorization is terminated  or revoked sooner.       Influenza A by PCR NEGATIVE NEGATIVE Final   Influenza B by PCR NEGATIVE NEGATIVE Final    Comment: (NOTE) The Xpert Xpress SARS-CoV-2/FLU/RSV plus assay is intended as an aid in the diagnosis of influenza from Nasopharyngeal swab specimens and should not be used as a sole basis for treatment. Nasal washings and aspirates are unacceptable for Xpert Xpress SARS-CoV-2/FLU/RSV testing.  Fact Sheet for Patients: EntrepreneurPulse.com.au  Fact Sheet for Healthcare Providers: IncredibleEmployment.be  This test is not yet approved or cleared by the [place] FDA and has been authorized for detection and/or diagnosis of SARS-CoV-2 by FDA under an Emergency Use Authorization (EUA). This EUA will remain in effect (meaning this test can be used) for the duration of the COVID-19 declaration under Section 564(b)(1) of the Act, 21 U.S.C. section 360bbb-3(b)(1), unless the authorization is terminated or revoked.  Performed at [facility], [address], [place], [place] [address]          Radiology Studies: No results found.      Scheduled Meds:  enoxaparin (LOVENOX) injection  90 mg Subcutaneous Q24H   ferrous gluconate  324 mg Oral Q breakfast   fluticasone  1 spray Each Nare Daily   folic acid  1 mg Oral Daily   nystatin   Topical BID  tamsulosin  0.4 mg Oral QHS     LOS: 4 days    Time spent: 35 minutes    Uthman Mroczkowski Darleen Crocker, DO [facility]  If 7PM-7AM, please contact night-coverage [email or web address] 12/21/2020, 11:30 AM

## 2020-12-22 DIAGNOSIS — G9341 Metabolic encephalopathy: Secondary | ICD-10-CM | POA: Diagnosis not present

## 2020-12-22 LAB — BASIC METABOLIC PANEL
Anion gap: 2 — ABNORMAL LOW (ref 5–15)
BUN: 41 mg/dL — ABNORMAL HIGH (ref 8–23)
CO2: 29 mmol/L (ref 22–32)
Calcium: 8.1 mg/dL — ABNORMAL LOW (ref 8.9–10.3)
Chloride: 107 mmol/L (ref 98–111)
Creatinine, Ser: 2.63 mg/dL — ABNORMAL HIGH (ref 0.61–1.24)
GFR, Estimated: 26 mL/min — ABNORMAL LOW (ref >60)
Glucose, Bld: 110 mg/dL — ABNORMAL HIGH (ref 70–99)
Potassium: 4.9 mmol/L (ref 3.5–5.1)
Sodium: 138 mmol/L (ref 135–145)

## 2020-12-22 LAB — CULTURE, BLOOD (ROUTINE X 2): Culture: NO GROWTH

## 2020-12-22 LAB — GLUCOSE, CAPILLARY: Glucose-Capillary: 102 mg/dL — ABNORMAL HIGH (ref 70–99)

## 2020-12-22 NOTE — Progress Notes (Signed)
Physical Therapy Treatment Patient Details Name: Eddie Fletcher MRN: [id number] DOB: 06-30-1955 Today's Date: 12/22/2020    History of Present Illness Eddie Fletcher is a 65 y.o. male who presents with AMS. Of note, pt with multiple hospital encounters for chronic low back pain, most recently here at [facility] and discharged 12/15/20. 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  Patient demonstrates slow labored movement for sitting up at bedside requiring HOB raised and use of bed rail and Mod/max assist to help pull self to sitting.  Patient tolerated sitting up at bedside for approximately 20 minutes while completing heel/toe raises, but limited for completing LAQ's due to c/o increased pain in low back with radiation down LLE.  Patient attempted sit to stands x 2, but unable due to BLE.  Patient demonstrated fair/good return for scooting forward at bedside, but unable to scoot laterally due to weakness and low back pain.  Patient put back to bed with fair/good return for using BUE/LE to pull/push self to head of bed with bed in head down position.  Patient will benefit from continued physical therapy in hospital and recommended venue below to increase strength, balance, endurance for safe ADLs and gait.     Follow Up Recommendations  SNF     Equipment Recommendations  Rolling walker with 5" wheels    Recommendations for Other Services       Precautions / Restrictions Precautions Precautions: Fall Restrictions Weight Bearing Restrictions: No    Mobility  Bed Mobility Overal bed mobility: Needs Assistance Bed Mobility: Supine to Sit;Sit to Sidelying;Rolling     Supine to sit: Mod assist;Max assist   Sit to sidelying: Mod assist General bed mobility comments: increased time, labored movement    Transfers                    Ambulation/Gait                 Stairs              Wheelchair Mobility    Modified Rankin (Stroke Patients Only)       Balance Overall balance assessment: Needs assistance Sitting-balance support: Feet supported;No upper extremity supported Sitting balance-Leahy Scale: Fair Sitting balance - Comments: fair/good supporting self with BUE                                    Cognition Arousal/Alertness: Awake/alert Behavior During Therapy: WFL for tasks assessed/performed Overall Cognitive Status: Within Functional Limits for tasks assessed                                        Exercises General Exercises - Lower Extremity Long Arc Quad: Seated;AROM;Strengthening;Both;5 reps Toe Raises: Seated;AROM;Strengthening;Both;10 reps Heel Raises: Seated;AROM;Strengthening;Both;10 reps    General Comments        Pertinent Vitals/Pain Pain Assessment: Faces Faces Pain Scale: Hurts whole lot Pain Location: low back with radiation down LLE Pain Descriptors / Indicators: Burning;Shooting;Sharp;Grimacing;Guarding Pain Intervention(s): Limited activity within patient's tolerance;Monitored during session;Repositioned;Patient requesting pain meds-RN notified    Home Living                      Prior Function  PT Goals (current goals can now be found in the care plan section) Acute Rehab PT Goals Patient Stated Goal: regain strength and independence PT Goal Formulation: With patient Time For Goal Achievement: 01/01/21 Potential to Achieve Goals: Good Progress towards PT goals: Progressing toward goals    Frequency    Min 3X/week      PT Plan Current plan remains appropriate    Co-evaluation              AM-PAC PT "6 Clicks" Mobility   Outcome Measure  Help needed turning from your back to your side while in a flat bed without using bedrails?: A Little Help needed moving from lying on your back to sitting on the side of a flat bed without using bedrails?: A  Lot Help needed moving to and from a bed to a chair (including a wheelchair)?: Total Help needed standing up from a chair using your arms (e.g., wheelchair or bedside chair)?: Total Help needed to walk in hospital room?: Total Help needed climbing 3-5 steps with a railing? : Total 6 Click Score: 9    End of Session   Activity Tolerance: Patient tolerated treatment well;Patient limited by fatigue Patient left: in bed Nurse Communication: Mobility status PT Visit Diagnosis: Muscle weakness (generalized) (M62.81);Other abnormalities of gait and mobility (R26.89);Unsteadiness on feet (R26.81)     Time: [id number] PT Time Calculation (min) (ACUTE ONLY): 30 min  Charges:  $Therapeutic Exercise: 8-22 mins $Therapeutic Activity: 8-22 mins                     {12:13 PM, 12/22/20 Lonell Grandchild, MPT Physical Therapist with [facility] 336 [phone] office [phone] mobile phone

## 2020-12-22 NOTE — Progress Notes (Signed)
PROGRESS NOTE    Eddie Fletcher  [id number] DOB: 1955-10-20 DOA: 12/17/2020 PCP: Curly Rim, MD   Brief Narrative:   Eddie Fletcher is a 65 y.o. male with medical history significant for HTN, morbid obesity, sleep apnea. Patient was brought to the ED via EMS reports of altered mental status.  Patient apparently has tried for SNF placement in the past, but was denied multiple bed offers.  He has apparently had multiple falls at home to and has difficulty ambulating.  EMS had noted that he had pinpoint pupils and was given some Narcan with improvement in mentation noted.  His mentation has improved and PT evaluation recommending SNF.  AKI is slowly improving with baseline near 2.  Assessment & Plan:   Principal Problem:   Acute metabolic encephalopathy Active Problems:   Apnea, sleep   Acute kidney injury superimposed on CKD (HCC)   Acute lower UTI   Morbid obesity with BMI of 60.0-69.9, adult (HCC)   Multiple falls  Acute metabolic encephalopathy-multifactorial-resolved -Likely a combination of UTI as well as narcotic medications in the setting of AKI -Blood and urine cultures with staph epidermis contaminant -Hold psychoactive agents to include gabapentin and Percocet -Patient now complaining of some back pain for which Dilaudid will be given as needed   UTI -Completed course of treatment with Rocephin -Urine culture with staph epidermidis which is likely contaminant   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-currently at baseline -Baseline creatinine uncertain and usually between 1.8-2.3   OSA -Does not wear CPAP at home, and refuses here   Chronic low back pain -MRI could not be performed due to patient's body habitus -EDP spoke with neurosurgery who suggested CT myelogram later on as an outpatient -Patient currently having some low back pain for which Dilaudid has been ordered as needed   Multiple falls -PT evaluation recommending SNF   Morbid obesity     DVT  prophylaxis: Lovenox Code Status: Full Family Communication: Discussed with wife on phone 8/11 Disposition Plan:  Status is: Inpatient   Remains inpatient appropriate because:Altered mental status, IV treatments appropriate due to intensity of illness or inability to take PO, and Inpatient level of care appropriate due to severity of illness   Dispo: The patient is from: Home              Anticipated d/c is to: SNF              Patient currently is not medically stable to d/c.              Difficult to place patient No   Consultants:  None   Procedures:  See below   Antimicrobials:  Anti-infectives (From admission, onward)    Start     Dose/Rate Route Frequency Ordered Stop   12/18/20 1700  cefTRIAXone (ROCEPHIN) 2 g in sodium chloride 0.9 % 100 mL IVPB  Status:  Discontinued        2 g 200 mL/hr over 30 Minutes Intravenous Every 24 hours 12/17/20 1943 12/20/20 1111   12/17/20 1930  cefTRIAXone (ROCEPHIN) 1 g in sodium chloride 0.9 % 100 mL IVPB       Note to Pharmacy: Totalling 2g, adjusted for weight.   1 g 200 mL/hr over 30 Minutes Intravenous  Once 12/17/20 1919 12/17/20 2045   12/17/20 1700  cefTRIAXone (ROCEPHIN) 1 g in sodium chloride 0.9 % 100 mL IVPB        1 g 200 mL/hr over 30 Minutes Intravenous  Once 12/17/20 1650 12/17/20 1759       Subjective: Patient seen and evaluated today with no new acute complaints or concerns. No acute concerns or events noted overnight.  Objective: Vitals:   12/21/20 1354 12/21/20 2003 12/22/20 0557 12/22/20 1306  BP: 131/73 (!) 112/50 115/60 (!) 148/78  Pulse: 98 96 86 93  Resp: '18 17 19 20  '$ Temp: 98.9 F (37.2 C) 98.9 F (37.2 C) 98.2 F (36.8 C) 99.1 F (37.3 C)  TempSrc:  Oral Oral Axillary  SpO2: 95% 93% 92% 93%  Weight:      Height:        Intake/Output Summary (Last 24 hours) at 12/22/2020 1327 Last data filed at 12/22/2020 1300 Gross per 24 hour  Intake 720 ml  Output --  Net 720 ml   Filed Weights    12/17/20 1341  Weight: (!) 182 kg    Examination:  General exam: Appears calm and comfortable, morbidly obese Respiratory system: Clear to auscultation. Respiratory effort normal. Cardiovascular system: S1 & S2 heard, RRR.  Gastrointestinal system: Abdomen is soft Central nervous system: Alert and awake Extremities: No edema Skin: No significant lesions noted Psychiatry: Flat affect.    Data Reviewed: I have personally reviewed following labs and imaging studies  CBC: Recent Labs  Lab 12/17/20 1529 12/18/20 0444 12/20/20 0903  WBC 10.2 7.2 4.2  NEUTROABS 8.5*  --   --   HGB 11.1* 9.5* 9.6*  HCT 34.8* 30.9* 30.6*  MCV 93.5 93.6 94.7  PLT 109* 93* 63*   Basic Metabolic Panel: Recent Labs  Lab 12/17/20 1529 12/18/20 0444 12/20/20 0633 12/21/20 0445 12/22/20 0733  NA 139 141 140 138 138  K 4.5 4.8 4.4 4.3 4.9  CL 107 111 106 105 107  CO2 '23 24 26 26 29  '$ GLUCOSE 89 85 99 116* 110*  BUN 58* 58* 48* 43* 41*  CREATININE 3.40* 3.12* 2.75* 2.53* 2.63*  CALCIUM 8.4* 8.3* 8.2* 8.1* 8.1*  MG  --   --  1.9  --   --    GFR: Estimated Creatinine Clearance: 45.2 mL/min (A) (by C-G formula based on SCr of 2.63 mg/dL (H)). Liver Function Tests: Recent Labs  Lab 12/17/20 1529  AST 31  ALT 24  ALKPHOS 113  BILITOT 1.1  PROT 7.3  ALBUMIN 2.5*   No results for input(s): LIPASE, AMYLASE in the last 168 hours. No results for input(s): AMMONIA in the last 168 hours. Coagulation Profile: No results for input(s): INR, PROTIME in the last 168 hours. Cardiac Enzymes: No results for input(s): CKTOTAL, CKMB, CKMBINDEX, TROPONINI in the last 168 hours. BNP (last 3 results) No results for input(s): PROBNP in the last 8760 hours. HbA1C: No results for input(s): HGBA1C in the last 72 hours. CBG: Recent Labs  Lab 12/18/20 1119 12/18/20 1557 12/19/20 0715 12/21/20 0715 12/22/20 0707  GLUCAP 137* 141* 131* 111* 102*   Lipid Profile: No results for input(s): CHOL, HDL,  LDLCALC, TRIG, CHOLHDL, LDLDIRECT in the last 72 hours. Thyroid Function Tests: No results for input(s): TSH, T4TOTAL, FREET4, T3FREE, THYROIDAB in the last 72 hours. Anemia Panel: No results for input(s): VITAMINB12, FOLATE, FERRITIN, TIBC, IRON, RETICCTPCT in the last 72 hours. Sepsis Labs: Recent Labs  Lab 12/17/20 1529 12/17/20 2009  LATICACIDVEN 2.0* 2.1*    Recent Results (from the past 240 hour(s))  Resp Panel by RT-PCR (Flu A&B, Covid) Nasopharyngeal Swab     Status: None   Collection Time: 12/12/20  4:03 PM  Specimen: Nasopharyngeal Swab; Nasopharyngeal(NP) swabs in vial transport medium  Result Value Ref Range Status   SARS Coronavirus 2 by RT PCR NEGATIVE NEGATIVE Final    Comment: (NOTE) SARS-CoV-2 target nucleic acids are NOT DETECTED.  The SARS-CoV-2 RNA is generally detectable in upper respiratory specimens during the acute phase of infection. The lowest concentration of SARS-CoV-2 viral copies this assay can detect is 138 copies/mL. A negative result does not preclude SARS-Cov-2 infection and should not be used as the sole basis for treatment or other patient management decisions. A negative result may occur with  improper specimen collection/handling, submission of specimen other than nasopharyngeal swab, presence of viral mutation(s) within the areas targeted by this assay, and inadequate number of viral copies(<138 copies/mL). A negative result must be combined with clinical observations, patient history, and epidemiological information. The expected result is Negative.  Fact Sheet for Patients:  EntrepreneurPulse.com.au  Fact Sheet for Healthcare Providers:  IncredibleEmployment.be  This test is no t yet approved or cleared by the [place] FDA and  has been authorized for detection and/or diagnosis of SARS-CoV-2 by FDA under an Emergency Use Authorization (EUA). This EUA will remain  in effect (meaning this test  can be used) for the duration of the COVID-19 declaration under Section 564(b)(1) of the Act, 21 U.S.C.section 360bbb-3(b)(1), unless the authorization is terminated  or revoked sooner.       Influenza A by PCR NEGATIVE NEGATIVE Final   Influenza B by PCR NEGATIVE NEGATIVE Final    Comment: (NOTE) The Xpert Xpress SARS-CoV-2/FLU/RSV plus assay is intended as an aid in the diagnosis of influenza from Nasopharyngeal swab specimens and should not be used as a sole basis for treatment. Nasal washings and aspirates are unacceptable for Xpert Xpress SARS-CoV-2/FLU/RSV testing.  Fact Sheet for Patients: EntrepreneurPulse.com.au  Fact Sheet for Healthcare Providers: IncredibleEmployment.be  This test is not yet approved or cleared by the [place] FDA and has been authorized for detection and/or diagnosis of SARS-CoV-2 by FDA under an Emergency Use Authorization (EUA). This EUA will remain in effect (meaning this test can be used) for the duration of the COVID-19 declaration under Section 564(b)(1) of the Act, 21 U.S.C. section 360bbb-3(b)(1), unless the authorization is terminated or revoked.  Performed at [facility], [address] [place], [place] [address]   Culture, blood (Routine X 2) w Reflex to ID Panel     Status: None (Preliminary result)   Collection Time: 12/17/20  3:29 PM   Specimen: BLOOD LEFT ARM  Result Value Ref Range Status   Specimen Description BLOOD LEFT ARM  Final   Special Requests   Final    BOTTLES DRAWN AEROBIC AND ANAEROBIC Blood Culture results may not be optimal due to an excessive volume of blood received in culture bottles   Culture   Final    NO GROWTH 3 DAYS Performed at [facility], [address] [place], [place] [address]    Report Status PENDING  Incomplete  Culture, blood (Routine X 2) w Reflex to ID Panel     Status: Abnormal   Collection Time: 12/17/20  3:29 PM   Specimen: Right Antecubital; Blood   Result Value Ref Range Status   Specimen Description   Final    RIGHT ANTECUBITAL Performed at [facility], [address], [place], [place] [address]    Special Requests   Final    BOTTLES DRAWN AEROBIC AND ANAEROBIC Blood Culture adequate volume Performed at [facility], [address]  [place], [place] [address]    Culture  Setup Time   Final    GRAM POSITIVE COCCI IN CLUSTERS AEROBIC BOTTLE ONLY Gram Stain Report Called to,Read Back By and Verified With: DEAN,P AT [id number] ON 12/18/20 BY HUFFINES,S Performed at [facility] ID to follow CRITICAL RESULT CALLED TO, READ BACK BY AND VERIFIED WITHHinton Dyer RN 2141 12/18/20 A BROWNING    Culture (A)  Final    STAPHYLOCOCCUS EPIDERMIDIS THE SIGNIFICANCE OF ISOLATING THIS ORGANISM FROM A SINGLE VENIPUNCTURE CANNOT BE PREDICTED WITHOUT FURTHER CLINICAL AND CULTURE CORRELATION. SUSCEPTIBILITIES AVAILABLE ONLY ON REQUEST. Performed at [facility], [place] [address], [place], [place] [address]    Report Status 12/20/2020 FINAL  Final  Blood Culture ID Panel (Reflexed)     Status: Abnormal   Collection Time: 12/17/20  3:29 PM  Result Value Ref Range Status   Enterococcus faecalis NOT DETECTED NOT DETECTED Final   Enterococcus Faecium NOT DETECTED NOT DETECTED Final   Listeria monocytogenes NOT DETECTED NOT DETECTED Final   Staphylococcus species DETECTED (A) NOT DETECTED Final    Comment: CRITICAL RESULT CALLED TO, READ BACK BY AND VERIFIED WITHHinton Dyer RN 2141 12/18/20 A BROWNING    Staphylococcus aureus (BCID) NOT DETECTED NOT DETECTED Final   Staphylococcus epidermidis DETECTED (A) NOT DETECTED Final    Comment: CRITICAL RESULT CALLED TO, READ BACK BY AND VERIFIED WITHHinton Dyer RN 2141 12/18/20 A BROWNING    Staphylococcus lugdunensis NOT DETECTED NOT DETECTED Final   Streptococcus species NOT DETECTED NOT DETECTED Final   Streptococcus agalactiae NOT DETECTED NOT DETECTED Final   Streptococcus pneumoniae NOT  DETECTED NOT DETECTED Final   Streptococcus pyogenes NOT DETECTED NOT DETECTED Final   A.calcoaceticus-baumannii NOT DETECTED NOT DETECTED Final   Bacteroides fragilis NOT DETECTED NOT DETECTED Final   Enterobacterales NOT DETECTED NOT DETECTED Final   Enterobacter cloacae complex NOT DETECTED NOT DETECTED Final   Escherichia coli NOT DETECTED NOT DETECTED Final   Klebsiella aerogenes NOT DETECTED NOT DETECTED Final   Klebsiella oxytoca NOT DETECTED NOT DETECTED Final   Klebsiella pneumoniae NOT DETECTED NOT DETECTED Final   Proteus species NOT DETECTED NOT DETECTED Final   Salmonella species NOT DETECTED NOT DETECTED Final   Serratia marcescens NOT DETECTED NOT DETECTED Final   Haemophilus influenzae NOT DETECTED NOT DETECTED Final   Neisseria meningitidis NOT DETECTED NOT DETECTED Final   Pseudomonas aeruginosa NOT DETECTED NOT DETECTED Final   Stenotrophomonas maltophilia NOT DETECTED NOT DETECTED Final   Candida albicans NOT DETECTED NOT DETECTED Final   Candida auris NOT DETECTED NOT DETECTED Final   Candida glabrata NOT DETECTED NOT DETECTED Final   Candida krusei NOT DETECTED NOT DETECTED Final   Candida parapsilosis NOT DETECTED NOT DETECTED Final   Candida tropicalis NOT DETECTED NOT DETECTED Final   Cryptococcus neoformans/gattii NOT DETECTED NOT DETECTED Final   Methicillin resistance mecA/C NOT DETECTED NOT DETECTED Final    Comment: Performed at [facility], [address], [place], [place] [address]  Urine Culture     Status: Abnormal   Collection Time: 12/17/20  3:47 PM   Specimen: Urine, Catheterized  Result Value Ref Range Status   Specimen Description   Final    URINE, CATHETERIZED Performed at [facility], [address], [place], [place] [address]    Special Requests   Final    NONE Performed at [facility], [address], [place], [place] [address]    Culture >=100,000 COLONIES/mL  STAPHYLOCOCCUS EPIDERMIDIS (A)  Final   Report Status 12/20/2020  FINAL  Final   Organism ID, Bacteria STAPHYLOCOCCUS EPIDERMIDIS (A)  Final      Susceptibility   Staphylococcus epidermidis - MIC*    CIPROFLOXACIN >=8 RESISTANT Resistant     GENTAMICIN <=0.5 SENSITIVE Sensitive     NITROFURANTOIN <=16 SENSITIVE Sensitive     OXACILLIN <=0.25 SENSITIVE Sensitive     TETRACYCLINE >=16 RESISTANT Resistant     VANCOMYCIN 1 SENSITIVE Sensitive     TRIMETH/SULFA 20 SENSITIVE Sensitive     CLINDAMYCIN >=8 RESISTANT Resistant     RIFAMPIN <=0.5 SENSITIVE Sensitive     Inducible Clindamycin NEGATIVE Sensitive     * >=100,000 COLONIES/mL STAPHYLOCOCCUS EPIDERMIDIS  Resp Panel by RT-PCR (Flu A&B, Covid) Nasopharyngeal Swab     Status: None   Collection Time: 12/17/20  5:37 PM   Specimen: Nasopharyngeal Swab; Nasopharyngeal(NP) swabs in vial transport medium  Result Value Ref Range Status   SARS Coronavirus 2 by RT PCR NEGATIVE NEGATIVE Final    Comment: (NOTE) SARS-CoV-2 target nucleic acids are NOT DETECTED.  The SARS-CoV-2 RNA is generally detectable in upper respiratory specimens during the acute phase of infection. The lowest concentration of SARS-CoV-2 viral copies this assay can detect is 138 copies/mL. A negative result does not preclude SARS-Cov-2 infection and should not be used as the sole basis for treatment or other patient management decisions. A negative result may occur with  improper specimen collection/handling, submission of specimen other than nasopharyngeal swab, presence of viral mutation(s) within the areas targeted by this assay, and inadequate number of viral copies(<138 copies/mL). A negative result must be combined with clinical observations, patient history, and epidemiological information. The expected result is Negative.  Fact Sheet for Patients:  EntrepreneurPulse.com.au  Fact Sheet for Healthcare Providers:  IncredibleEmployment.be  This test is no t yet approved or cleared by the  [place] FDA and  has been authorized for detection and/or diagnosis of SARS-CoV-2 by FDA under an Emergency Use Authorization (EUA). This EUA will remain  in effect (meaning this test can be used) for the duration of the COVID-19 declaration under Section 564(b)(1) of the Act, 21 U.S.C.section 360bbb-3(b)(1), unless the authorization is terminated  or revoked sooner.       Influenza A by PCR NEGATIVE NEGATIVE Final   Influenza B by PCR NEGATIVE NEGATIVE Final    Comment: (NOTE) The Xpert Xpress SARS-CoV-2/FLU/RSV plus assay is intended as an aid in the diagnosis of influenza from Nasopharyngeal swab specimens and should not be used as a sole basis for treatment. Nasal washings and aspirates are unacceptable for Xpert Xpress SARS-CoV-2/FLU/RSV testing.  Fact Sheet for Patients: EntrepreneurPulse.com.au  Fact Sheet for Healthcare Providers: IncredibleEmployment.be  This test is not yet approved or cleared by the [place] FDA and has been authorized for detection and/or diagnosis of SARS-CoV-2 by FDA under an Emergency Use Authorization (EUA). This EUA will remain in effect (meaning this test can be used) for the duration of the COVID-19 declaration under Section 564(b)(1) of the Act, 21 U.S.C. section 360bbb-3(b)(1), unless the authorization is terminated or revoked.  Performed at [facility], [address], [place], [place] [address]          Radiology Studies: No results found.      Scheduled Meds:  enoxaparin (LOVENOX) injection  90 mg Subcutaneous Q24H   ferrous gluconate  324 mg Oral Q breakfast   fluticasone  1 spray Each Nare Daily   folic acid  1 mg Oral Daily   nystatin   Topical BID   tamsulosin  0.4 mg Oral QHS     LOS: 5 days    Time spent: 35 minutes    Zonnie Landen Darleen Crocker, DO [facility]  If 7PM-7AM, please contact night-coverage [email or web address] 12/22/2020, 1:27 PM

## 2020-12-22 NOTE — TOC Progression Note (Signed)
Transition of Care [facility]) - Progression Note    Patient Details  Name: Eddie Fletcher MRN: [id number] Date of Birth: Sep 17, 1955  Transition of Care [facility]) CM/SW Contact  Shade Flood, LCSW Phone Number: 12/22/2020, 12:40 PM  Clinical Narrative:     TOC notified by Jackelyn Poling at [facility] that insurance requesting updated PT note. Notified James in PT who saw pt and completed note. Updated Debbie at [facility] and she will submit to [facility].   Anticipating dc once insurance authorization process is complete.  Expected Discharge Plan: Skilled Nursing Facility Barriers to Discharge: Barriers Resolved  Expected Discharge Plan and Services Expected Discharge Plan: [place]                                               Social Determinants of Health (SDOH) Interventions    Readmission Risk Interventions No flowsheet data found.

## 2020-12-23 DIAGNOSIS — G9341 Metabolic encephalopathy: Secondary | ICD-10-CM | POA: Diagnosis not present

## 2020-12-23 LAB — GLUCOSE, CAPILLARY: Glucose-Capillary: 99 mg/dL (ref 70–99)

## 2020-12-23 NOTE — Care Management Important Message (Signed)
Important Message  Patient Details  Name: Eddie Fletcher MRN: [id number] Date of Birth: 07/02/1955   Medicare Important Message Given:  Yes     Tommy Medal 12/23/2020, 1:16 PM

## 2020-12-23 NOTE — Progress Notes (Signed)
PROGRESS NOTE    Eddie Fletcher  [id number] DOB: 1955-09-14 DOA: 12/17/2020 PCP: Curly Rim, MD   Brief Narrative:   Eddie Fletcher is a 65 y.o. male with medical history significant for HTN, morbid obesity, sleep apnea. Patient was brought to the ED via EMS reports of altered mental status.  Patient apparently has tried for SNF placement in the past, but was denied multiple bed offers.  He has apparently had multiple falls at home to and has difficulty ambulating.  EMS had noted that he had pinpoint pupils and was given some Narcan with improvement in mentation noted.  His mentation has improved and PT evaluation recommending SNF.  AKI is resolved and patient is near baseline. He is awaiting insurance authorization to go to SNF.  Assessment & Plan:   Principal Problem:   Acute metabolic encephalopathy Active Problems:   Apnea, sleep   Acute kidney injury superimposed on CKD (HCC)   Acute lower UTI   Morbid obesity with BMI of 60.0-69.9, adult (HCC)   Multiple falls   Acute metabolic encephalopathy-multifactorial-resolved -Likely a combination of UTI as well as narcotic medications in the setting of AKI -Blood and urine cultures with staph epidermis contaminant -Hold psychoactive agents to include gabapentin and Percocet -Patient now complaining of some back pain for which Dilaudid will be given as needed   UTI -Completed course of treatment with Rocephin -Urine culture with staph epidermidis which is likely contaminant   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-currently near baseline -Baseline creatinine uncertain and usually between 1.8-2.5   OSA -Does not wear CPAP at home, and refuses here   Chronic low back pain -MRI could not be performed due to patient's body habitus -EDP spoke with neurosurgery who suggested CT myelogram later on as an outpatient -Patient currently having some low back pain for which Dilaudid has been ordered as needed   Multiple falls -PT  evaluation recommending SNF   Morbid obesity     DVT prophylaxis: Lovenox Code Status: Full Family Communication: Discussed with wife on phone 8/11 Disposition Plan:  Status is: Inpatient   Remains inpatient appropriate because:Altered mental status, IV treatments appropriate due to intensity of illness or inability to take PO, and Inpatient level of care appropriate due to severity of illness   Dispo: The patient is from: Home              Anticipated d/c is to: SNF              Patient currently is not medically stable to d/c.              Difficult to place patient No   Consultants:  None   Procedures:  See below  Antimicrobials:  Anti-infectives (From admission, onward)    Start     Dose/Rate Route Frequency Ordered Stop   12/18/20 1700  cefTRIAXone (ROCEPHIN) 2 g in sodium chloride 0.9 % 100 mL IVPB  Status:  Discontinued        2 g 200 mL/hr over 30 Minutes Intravenous Every 24 hours 12/17/20 1943 12/20/20 1111   12/17/20 1930  cefTRIAXone (ROCEPHIN) 1 g in sodium chloride 0.9 % 100 mL IVPB       Note to Pharmacy: Totalling 2g, adjusted for weight.   1 g 200 mL/hr over 30 Minutes Intravenous  Once 12/17/20 1919 12/17/20 2045   12/17/20 1700  cefTRIAXone (ROCEPHIN) 1 g in sodium chloride 0.9 % 100 mL IVPB  1 g 200 mL/hr over 30 Minutes Intravenous  Once 12/17/20 1650 12/17/20 1759       Subjective: Patient seen and evaluated today with no new acute complaints or concerns. No acute concerns or events noted overnight.  Objective: Vitals:   12/22/20 1306 12/22/20 2041 12/23/20 0456 12/23/20 1258  BP: (!) 148/78 (!) 146/51 (!) 157/65 (!) 172/68  Pulse: 93 96 87 94  Resp: '20 19 19 18  '$ Temp: 99.1 F (37.3 C) 98.2 F (36.8 C) 97.8 F (36.6 C) 98.7 F (37.1 C)  TempSrc: Axillary  Oral Oral  SpO2: 93% 96% 91% 95%  Weight:      Height:        Intake/Output Summary (Last 24 hours) at 12/23/2020 1522 Last data filed at 12/23/2020 0900 Gross per 24 hour   Intake 240 ml  Output 700 ml  Net -460 ml   Filed Weights   12/17/20 1341  Weight: (!) 182 kg    Examination:  General exam: Appears calm and comfortable, morbidly obese Respiratory system: Clear to auscultation. Respiratory effort normal. Cardiovascular system: S1 & S2 heard, RRR.  Gastrointestinal system: Abdomen is soft Central nervous system: Alert and awake Extremities: No edema Skin: No significant lesions noted Psychiatry: Flat affect.    Data Reviewed: I have personally reviewed following labs and imaging studies  CBC: Recent Labs  Lab 12/17/20 1529 12/18/20 0444 12/20/20 0903  WBC 10.2 7.2 4.2  NEUTROABS 8.5*  --   --   HGB 11.1* 9.5* 9.6*  HCT 34.8* 30.9* 30.6*  MCV 93.5 93.6 94.7  PLT 109* 93* 63*   Basic Metabolic Panel: Recent Labs  Lab 12/17/20 1529 12/18/20 0444 12/20/20 0633 12/21/20 0445 12/22/20 0733  NA 139 141 140 138 138  K 4.5 4.8 4.4 4.3 4.9  CL 107 111 106 105 107  CO2 '23 24 26 26 29  '$ GLUCOSE 89 85 99 116* 110*  BUN 58* 58* 48* 43* 41*  CREATININE 3.40* 3.12* 2.75* 2.53* 2.63*  CALCIUM 8.4* 8.3* 8.2* 8.1* 8.1*  MG  --   --  1.9  --   --    GFR: Estimated Creatinine Clearance: 45.2 mL/min (A) (by C-G formula based on SCr of 2.63 mg/dL (H)). Liver Function Tests: Recent Labs  Lab 12/17/20 1529  AST 31  ALT 24  ALKPHOS 113  BILITOT 1.1  PROT 7.3  ALBUMIN 2.5*   No results for input(s): LIPASE, AMYLASE in the last 168 hours. No results for input(s): AMMONIA in the last 168 hours. Coagulation Profile: No results for input(s): INR, PROTIME in the last 168 hours. Cardiac Enzymes: No results for input(s): CKTOTAL, CKMB, CKMBINDEX, TROPONINI in the last 168 hours. BNP (last 3 results) No results for input(s): PROBNP in the last 8760 hours. HbA1C: No results for input(s): HGBA1C in the last 72 hours. CBG: Recent Labs  Lab 12/18/20 1557 12/19/20 0715 12/21/20 0715 12/22/20 0707 12/23/20 0723  GLUCAP 141* 131* 111*  102* 99   Lipid Profile: No results for input(s): CHOL, HDL, LDLCALC, TRIG, CHOLHDL, LDLDIRECT in the last 72 hours. Thyroid Function Tests: No results for input(s): TSH, T4TOTAL, FREET4, T3FREE, THYROIDAB in the last 72 hours. Anemia Panel: No results for input(s): VITAMINB12, FOLATE, FERRITIN, TIBC, IRON, RETICCTPCT in the last 72 hours. Sepsis Labs: Recent Labs  Lab 12/17/20 1529 12/17/20 2009  LATICACIDVEN 2.0* 2.1*    Recent Results (from the past 240 hour(s))  Culture, blood (Routine X 2) w Reflex to ID Panel  Status: None   Collection Time: 12/17/20  3:29 PM   Specimen: BLOOD LEFT ARM  Result Value Ref Range Status   Specimen Description BLOOD LEFT ARM  Final   Special Requests   Final    BOTTLES DRAWN AEROBIC AND ANAEROBIC Blood Culture results may not be optimal due to an excessive volume of blood received in culture bottles   Culture   Final    NO GROWTH 5 DAYS Performed at [facility], [address] [place], [place] [address]    Report Status 12/22/2020 FINAL  Final  Culture, blood (Routine X 2) w Reflex to ID Panel     Status: Abnormal   Collection Time: 12/17/20  3:29 PM   Specimen: Right Antecubital; Blood  Result Value Ref Range Status   Specimen Description   Final    RIGHT ANTECUBITAL Performed at [facility], [address], [place], [place] [address]    Special Requests   Final    BOTTLES DRAWN AEROBIC AND ANAEROBIC Blood Culture adequate volume Performed at [facility], [address], [place], [place] [address]    Culture  Setup Time   Final    GRAM POSITIVE COCCI IN CLUSTERS AEROBIC BOTTLE ONLY Gram Stain Report Called to,Read Back By and Verified With: DEAN,P AT [id number] ON 12/18/20 BY HUFFINES,S Performed at [facility] ID to follow CRITICAL RESULT CALLED TO, READ BACK BY AND VERIFIED WITHHinton Dyer RN 2141 12/18/20 A BROWNING    Culture (A)  Final    STAPHYLOCOCCUS EPIDERMIDIS THE SIGNIFICANCE OF ISOLATING THIS ORGANISM FROM  A SINGLE VENIPUNCTURE CANNOT BE PREDICTED WITHOUT FURTHER CLINICAL AND CULTURE CORRELATION. SUSCEPTIBILITIES AVAILABLE ONLY ON REQUEST. Performed at [facility], [place] [address] [place], [place] [address]    Report Status 12/20/2020 FINAL  Final  Blood Culture ID Panel (Reflexed)     Status: Abnormal   Collection Time: 12/17/20  3:29 PM  Result Value Ref Range Status   Enterococcus faecalis NOT DETECTED NOT DETECTED Final   Enterococcus Faecium NOT DETECTED NOT DETECTED Final   Listeria monocytogenes NOT DETECTED NOT DETECTED Final   Staphylococcus species DETECTED (A) NOT DETECTED Final    Comment: CRITICAL RESULT CALLED TO, READ BACK BY AND VERIFIED WITHHinton Dyer RN 2141 12/18/20 A BROWNING    Staphylococcus aureus (BCID) NOT DETECTED NOT DETECTED Final   Staphylococcus epidermidis DETECTED (A) NOT DETECTED Final    Comment: CRITICAL RESULT CALLED TO, READ BACK BY AND VERIFIED WITHHinton Dyer RN 2141 12/18/20 A BROWNING    Staphylococcus lugdunensis NOT DETECTED NOT DETECTED Final   Streptococcus species NOT DETECTED NOT DETECTED Final   Streptococcus agalactiae NOT DETECTED NOT DETECTED Final   Streptococcus pneumoniae NOT DETECTED NOT DETECTED Final   Streptococcus pyogenes NOT DETECTED NOT DETECTED Final   A.calcoaceticus-baumannii NOT DETECTED NOT DETECTED Final   Bacteroides fragilis NOT DETECTED NOT DETECTED Final   Enterobacterales NOT DETECTED NOT DETECTED Final   Enterobacter cloacae complex NOT DETECTED NOT DETECTED Final   Escherichia coli NOT DETECTED NOT DETECTED Final   Klebsiella aerogenes NOT DETECTED NOT DETECTED Final   Klebsiella oxytoca NOT DETECTED NOT DETECTED Final   Klebsiella pneumoniae NOT DETECTED NOT DETECTED Final   Proteus species NOT DETECTED NOT DETECTED Final   Salmonella species NOT DETECTED NOT DETECTED Final   Serratia marcescens NOT DETECTED NOT DETECTED Final   Haemophilus influenzae NOT DETECTED NOT DETECTED Final   Neisseria  meningitidis NOT DETECTED NOT DETECTED Final  Pseudomonas aeruginosa NOT DETECTED NOT DETECTED Final   Stenotrophomonas maltophilia NOT DETECTED NOT DETECTED Final   Candida albicans NOT DETECTED NOT DETECTED Final   Candida auris NOT DETECTED NOT DETECTED Final   Candida glabrata NOT DETECTED NOT DETECTED Final   Candida krusei NOT DETECTED NOT DETECTED Final   Candida parapsilosis NOT DETECTED NOT DETECTED Final   Candida tropicalis NOT DETECTED NOT DETECTED Final   Cryptococcus neoformans/gattii NOT DETECTED NOT DETECTED Final   Methicillin resistance mecA/C NOT DETECTED NOT DETECTED Final    Comment: Performed at [facility], [place] [address] [place], [place] [address]  Urine Culture     Status: Abnormal   Collection Time: 12/17/20  3:47 PM   Specimen: Urine, Catheterized  Result Value Ref Range Status   Specimen Description   Final    URINE, CATHETERIZED Performed at [facility], [address] [place], [place] [address]    Special Requests   Final    NONE Performed at [facility], [address], [place], [place] [address]    Culture >=100,000 COLONIES/mL STAPHYLOCOCCUS EPIDERMIDIS (A)  Final   Report Status 12/20/2020 FINAL  Final   Organism ID, Bacteria STAPHYLOCOCCUS EPIDERMIDIS (A)  Final      Susceptibility   Staphylococcus epidermidis - MIC*    CIPROFLOXACIN >=8 RESISTANT Resistant     GENTAMICIN <=0.5 SENSITIVE Sensitive     NITROFURANTOIN <=16 SENSITIVE Sensitive     OXACILLIN <=0.25 SENSITIVE Sensitive     TETRACYCLINE >=16 RESISTANT Resistant     VANCOMYCIN 1 SENSITIVE Sensitive     TRIMETH/SULFA 20 SENSITIVE Sensitive     CLINDAMYCIN >=8 RESISTANT Resistant     RIFAMPIN <=0.5 SENSITIVE Sensitive     Inducible Clindamycin NEGATIVE Sensitive     * >=100,000 COLONIES/mL STAPHYLOCOCCUS EPIDERMIDIS  Resp Panel by RT-PCR (Flu A&B, Covid) Nasopharyngeal Swab     Status: None   Collection Time: 12/17/20  5:37 PM   Specimen: Nasopharyngeal Swab;  Nasopharyngeal(NP) swabs in vial transport medium  Result Value Ref Range Status   SARS Coronavirus 2 by RT PCR NEGATIVE NEGATIVE Final    Comment: (NOTE) SARS-CoV-2 target nucleic acids are NOT DETECTED.  The SARS-CoV-2 RNA is generally detectable in upper respiratory specimens during the acute phase of infection. The lowest concentration of SARS-CoV-2 viral copies this assay can detect is 138 copies/mL. A negative result does not preclude SARS-Cov-2 infection and should not be used as the sole basis for treatment or other patient management decisions. A negative result may occur with  improper specimen collection/handling, submission of specimen other than nasopharyngeal swab, presence of viral mutation(s) within the areas targeted by this assay, and inadequate number of viral copies(<138 copies/mL). A negative result must be combined with clinical observations, patient history, and epidemiological information. The expected result is Negative.  Fact Sheet for Patients:  EntrepreneurPulse.com.au  Fact Sheet for Healthcare Providers:  IncredibleEmployment.be  This test is no t yet approved or cleared by the [place] FDA and  has been authorized for detection and/or diagnosis of SARS-CoV-2 by FDA under an Emergency Use Authorization (EUA). This EUA will remain  in effect (meaning this test can be used) for the duration of the COVID-19 declaration under Section 564(b)(1) of the Act, 21 U.S.C.section 360bbb-3(b)(1), unless the authorization is terminated  or revoked sooner.       Influenza A by PCR NEGATIVE NEGATIVE Final   Influenza B by PCR NEGATIVE NEGATIVE Final    Comment: (NOTE) The Xpert Xpress  SARS-CoV-2/FLU/RSV plus assay is intended as an aid in the diagnosis of influenza from Nasopharyngeal swab specimens and should not be used as a sole basis for treatment. Nasal washings and aspirates are unacceptable for Xpert Xpress  SARS-CoV-2/FLU/RSV testing.  Fact Sheet for Patients: EntrepreneurPulse.com.au  Fact Sheet for Healthcare Providers: IncredibleEmployment.be  This test is not yet approved or cleared by the [place] FDA and has been authorized for detection and/or diagnosis of SARS-CoV-2 by FDA under an Emergency Use Authorization (EUA). This EUA will remain in effect (meaning this test can be used) for the duration of the COVID-19 declaration under Section 564(b)(1) of the Act, 21 U.S.C. section 360bbb-3(b)(1), unless the authorization is terminated or revoked.  Performed at [facility], [address], [place], [place] [address]          Radiology Studies: No results found.      Scheduled Meds:  enoxaparin (LOVENOX) injection  90 mg Subcutaneous Q24H   ferrous gluconate  324 mg Oral Q breakfast   fluticasone  1 spray Each Nare Daily   folic acid  1 mg Oral Daily   nystatin   Topical BID   tamsulosin  0.4 mg Oral QHS     LOS: 6 days    Time spent: 35 minutes    Yareth Kearse Darleen Crocker, DO [facility]  If 7PM-7AM, please contact night-coverage [email or web address] 12/23/2020, 3:22 PM

## 2020-12-24 DIAGNOSIS — Z6841 Body Mass Index (BMI) 40.0 and over, adult: Secondary | ICD-10-CM

## 2020-12-24 DIAGNOSIS — M545 Low back pain, unspecified: Secondary | ICD-10-CM

## 2020-12-24 DIAGNOSIS — R296 Repeated falls: Secondary | ICD-10-CM

## 2020-12-24 DIAGNOSIS — N189 Chronic kidney disease, unspecified: Secondary | ICD-10-CM

## 2020-12-24 DIAGNOSIS — G8929 Other chronic pain: Secondary | ICD-10-CM

## 2020-12-24 DIAGNOSIS — G9341 Metabolic encephalopathy: Secondary | ICD-10-CM

## 2020-12-24 DIAGNOSIS — N390 Urinary tract infection, site not specified: Secondary | ICD-10-CM | POA: Diagnosis not present

## 2020-12-24 DIAGNOSIS — N179 Acute kidney failure, unspecified: Secondary | ICD-10-CM

## 2020-12-24 NOTE — Progress Notes (Signed)
PROGRESS NOTE    Eddie Fletcher  [id number] DOB: January 04, 1956 DOA: 12/17/2020 PCP: Curly Rim, MD   Brief Narrative:   MADDOC GABAY is a 65 y.o. male with medical history significant for HTN, morbid obesity, sleep apnea. Patient was brought to the ED via EMS reports of altered mental status.  Patient apparently has tried for SNF placement in the past, but was denied multiple bed offers.  He has apparently had multiple falls at home to and has difficulty ambulating.  EMS had noted that he had pinpoint pupils and was given some Narcan with improvement in mentation noted.  His mentation has improved and PT evaluation recommending SNF.  AKI is resolved and patient is near baseline. He is awaiting insurance authorization to go to SNF.  Assessment & Plan:   Principal Problem:   Acute metabolic encephalopathy Active Problems:   Apnea, sleep   Acute kidney injury superimposed on CKD ([place])   Acute lower UTI   Morbid obesity with BMI of 60.0-69.9, adult (HCC)   Multiple falls   Acute metabolic encephalopathy-multifactorial-resolved -Likely a combination of UTI as well as narcotic medications in the setting of AKI -Blood and urine cultures with staph epidermis contaminant -Continue holding psychoactive agents to include gabapentin and Percocet -Patient now complaining of some back pain for which Dilaudid will be given as needed -Outpatient follow-up with neurosurgery/PCP; planning for CT myelogram as an outpatient (see below for details). -Physical therapy and rehabilitation and skilled nursing facility recommended.   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   OSA -Does not wear CPAP at home, and refuses here -Education has been provided.   Chronic  low back pain -MRI could not be performed due to patient's body habitus -EDP spoke with neurosurgery who suggested CT myelogram later on as an outpatient -Patient currently having some low back pain for which Dilaudid has been ordered as needed   Multiple falls -PT evaluation recommending SNF -Patient in agreement to pursue rehabilitation and conditioning at skilled nursing facility; prior to admission he was able to ambulate, transfer position and perform some of his activities of daily living with minimal assistance.  At this whole hospitalization and acute illness patient requiring moderate to max assistance as per physical therapy evaluation and is severely weak and deconditioned.   Morbid obesity -Body mass index is 62.84 kg/m. -Low calorie diet, portion control and increase physical activity discussed with patient.     DVT prophylaxis: Lovenox Code Status: Full Family Communication: No family at bedside. Disposition Plan:  Status is: Inpatient   Remains inpatient appropriate because:Altered mental status, IV treatments appropriate due to intensity of illness or inability to take PO, and Inpatient level of care appropriate due to severity of illness   Dispo: The patient is from: Home              Anticipated d/c is to: SNF              Patient currently is not medically stable to d/c.              Difficult to place patient No   Consultants:  None   Procedures:  See below  Antimicrobials:  Anti-infectives (From admission, onward)    Start     Dose/Rate Route Frequency Ordered Stop  12/18/20 1700  cefTRIAXone (ROCEPHIN) 2 g in sodium chloride 0.9 % 100 mL IVPB  Status:  Discontinued        2 g 200 mL/hr over 30 Minutes Intravenous Every 24 hours 12/17/20 1943 12/20/20 1111   12/17/20 1930  cefTRIAXone (ROCEPHIN) 1 g in sodium chloride 0.9 % 100 mL IVPB       Note to Pharmacy: Totalling 2g, adjusted for weight.   1 g 200 mL/hr over 30 Minutes Intravenous  Once 12/17/20  1919 12/17/20 2045   12/17/20 1700  cefTRIAXone (ROCEPHIN) 1 g in sodium chloride 0.9 % 100 mL IVPB        1 g 200 mL/hr over 30 Minutes Intravenous  Once 12/17/20 1650 12/17/20 1759       Subjective: Still complaining of lower back pain, weak, deconditioned and requiring moderate assistance while attempting physical therapy evaluation.  Objective: Vitals:   12/23/20 1258 12/23/20 2022 12/24/20 0419 12/24/20 1410  BP: (!) 172/68 (!) 120/50 (!) 144/60 140/62  Pulse: 94 94 90 90  Resp: '18 19 19 18  '$ Temp: 98.7 F (37.1 C) 98.1 F (36.7 C) 98.9 F (37.2 C) 98.7 F (37.1 C)  TempSrc: Oral  Oral Oral  SpO2: 95% 92% 90% 92%  Weight:      Height:        Intake/Output Summary (Last 24 hours) at 12/24/2020 1710 Last data filed at 12/24/2020 1300 Gross per 24 hour  Intake 480 ml  Output --  Net 480 ml   Filed Weights   12/17/20 1341  Weight: (!) 182 kg    Examination: General exam: Alert, awake, oriented x 3, obese and complaining  Respiratory system: Clear to auscultation. Respiratory effort normal. No requiring oxygen supplementation. Cardiovascular system: RRR. No rubs, no gallops; unable to assess JVD due to body habitus. Gastrointestinal system: Abdomen is obese, nontender to palpation; soft, positive bowel sounds.  No guarding. Central nervous system: Alert and oriented. No focal neurological deficits. Extremities: No cyanosis or clubbing. Skin: No petechiae. Psychiatry: Judgement and insight appear normal. Mood & affect appropriate.   Data Reviewed: I have personally reviewed following labs and imaging studies  CBC: Recent Labs  Lab 12/18/20 0444 12/20/20 0903  WBC 7.2 4.2  HGB 9.5* 9.6*  HCT 30.9* 30.6*  MCV 93.6 94.7  PLT 93* 63*   Basic Metabolic Panel: Recent Labs  Lab 12/18/20 0444 12/20/20 0633 12/21/20 0445 12/22/20 0733  NA 141 140 138 138  K 4.8 4.4 4.3 4.9  CL 111 106 105 107  CO2 '24 26 26 29  '$ GLUCOSE 85 99 116* 110*  BUN 58* 48* 43* 41*   CREATININE 3.12* 2.75* 2.53* 2.63*  CALCIUM 8.3* 8.2* 8.1* 8.1*  MG  --  1.9  --   --    GFR: Estimated Creatinine Clearance: 45.2 mL/min (A) (by C-G formula based on SCr of 2.63 mg/dL (H)).  CBG: Recent Labs  Lab 12/18/20 1557 12/19/20 0715 12/21/20 0715 12/22/20 0707 12/23/20 0723  GLUCAP 141* 131* 111* 102* 99   Sepsis Labs: Recent Labs  Lab 12/17/20 2009  LATICACIDVEN 2.1*    Recent Results (from the past 240 hour(s))  Culture, blood (Routine X 2) w Reflex to ID Panel     Status: None   Collection Time: 12/17/20  3:29 PM   Specimen: BLOOD LEFT ARM  Result Value Ref Range Status   Specimen Description BLOOD LEFT ARM  Final   Special Requests   Final    BOTTLES  DRAWN AEROBIC AND ANAEROBIC Blood Culture results may not be optimal due to an excessive volume of blood received in culture bottles   Culture   Final    NO GROWTH 5 DAYS Performed at [facility], [address] [place], [place] [address]    Report Status 12/22/2020 FINAL  Final  Culture, blood (Routine X 2) w Reflex to ID Panel     Status: Abnormal   Collection Time: 12/17/20  3:29 PM   Specimen: Right Antecubital; Blood  Result Value Ref Range Status   Specimen Description   Final    RIGHT ANTECUBITAL Performed at [facility], [address], [place], [place] [address]    Special Requests   Final    BOTTLES DRAWN AEROBIC AND ANAEROBIC Blood Culture adequate volume Performed at [facility], [address], [place], [place] [address]    Culture  Setup Time   Final    GRAM POSITIVE COCCI IN CLUSTERS AEROBIC BOTTLE ONLY Gram Stain Report Called to,Read Back By and Verified With: DEAN,P AT [id number] ON 12/18/20 BY HUFFINES,S Performed at [facility] ID to follow CRITICAL RESULT CALLED TO, READ BACK BY AND VERIFIED WITHHinton Dyer RN 2141 12/18/20 A BROWNING    Culture (A)  Final    STAPHYLOCOCCUS EPIDERMIDIS THE SIGNIFICANCE OF ISOLATING THIS ORGANISM FROM A SINGLE VENIPUNCTURE CANNOT BE  PREDICTED WITHOUT FURTHER CLINICAL AND CULTURE CORRELATION. SUSCEPTIBILITIES AVAILABLE ONLY ON REQUEST. Performed at [facility], [place] [address], [place], [place] [address]    Report Status 12/20/2020 FINAL  Final  Blood Culture ID Panel (Reflexed)     Status: Abnormal   Collection Time: 12/17/20  3:29 PM  Result Value Ref Range Status   Enterococcus faecalis NOT DETECTED NOT DETECTED Final   Enterococcus Faecium NOT DETECTED NOT DETECTED Final   Listeria monocytogenes NOT DETECTED NOT DETECTED Final   Staphylococcus species DETECTED (A) NOT DETECTED Final    Comment: CRITICAL RESULT CALLED TO, READ BACK BY AND VERIFIED WITHHinton Dyer RN 2141 12/18/20 A BROWNING    Staphylococcus aureus (BCID) NOT DETECTED NOT DETECTED Final   Staphylococcus epidermidis DETECTED (A) NOT DETECTED Final    Comment: CRITICAL RESULT CALLED TO, READ BACK BY AND VERIFIED WITHHinton Dyer RN 2141 12/18/20 A BROWNING    Staphylococcus lugdunensis NOT DETECTED NOT DETECTED Final   Streptococcus species NOT DETECTED NOT DETECTED Final   Streptococcus agalactiae NOT DETECTED NOT DETECTED Final   Streptococcus pneumoniae NOT DETECTED NOT DETECTED Final   Streptococcus pyogenes NOT DETECTED NOT DETECTED Final   A.calcoaceticus-baumannii NOT DETECTED NOT DETECTED Final   Bacteroides fragilis NOT DETECTED NOT DETECTED Final   Enterobacterales NOT DETECTED NOT DETECTED Final   Enterobacter cloacae complex NOT DETECTED NOT DETECTED Final   Escherichia coli NOT DETECTED NOT DETECTED Final   Klebsiella aerogenes NOT DETECTED NOT DETECTED Final   Klebsiella oxytoca NOT DETECTED NOT DETECTED Final   Klebsiella pneumoniae NOT DETECTED NOT DETECTED Final   Proteus species NOT DETECTED NOT DETECTED Final   Salmonella species NOT DETECTED NOT DETECTED Final   Serratia marcescens NOT DETECTED NOT DETECTED Final   Haemophilus influenzae NOT DETECTED NOT DETECTED Final   Neisseria meningitidis NOT DETECTED NOT DETECTED  Final   Pseudomonas aeruginosa NOT DETECTED NOT DETECTED Final   Stenotrophomonas maltophilia NOT DETECTED NOT DETECTED Final   Candida albicans NOT DETECTED NOT DETECTED Final   Candida auris NOT DETECTED NOT DETECTED Final   Candida glabrata NOT DETECTED NOT DETECTED  Final   Candida krusei NOT DETECTED NOT DETECTED Final   Candida parapsilosis NOT DETECTED NOT DETECTED Final   Candida tropicalis NOT DETECTED NOT DETECTED Final   Cryptococcus neoformans/gattii NOT DETECTED NOT DETECTED Final   Methicillin resistance mecA/C NOT DETECTED NOT DETECTED Final    Comment: Performed at [facility], [place] [address] [place], [place] [address]  Urine Culture     Status: Abnormal   Collection Time: 12/17/20  3:47 PM   Specimen: Urine, Catheterized  Result Value Ref Range Status   Specimen Description   Final    URINE, CATHETERIZED Performed at [facility], [address], [place], [place] [address]    Special Requests   Final    NONE Performed at [facility], [address], [place], [place] [address]    Culture >=100,000 COLONIES/mL STAPHYLOCOCCUS EPIDERMIDIS (A)  Final   Report Status 12/20/2020 FINAL  Final   Organism ID, Bacteria STAPHYLOCOCCUS EPIDERMIDIS (A)  Final      Susceptibility   Staphylococcus epidermidis - MIC*    CIPROFLOXACIN >=8 RESISTANT Resistant     GENTAMICIN <=0.5 SENSITIVE Sensitive     NITROFURANTOIN <=16 SENSITIVE Sensitive     OXACILLIN <=0.25 SENSITIVE Sensitive     TETRACYCLINE >=16 RESISTANT Resistant     VANCOMYCIN 1 SENSITIVE Sensitive     TRIMETH/SULFA 20 SENSITIVE Sensitive     CLINDAMYCIN >=8 RESISTANT Resistant     RIFAMPIN <=0.5 SENSITIVE Sensitive     Inducible Clindamycin NEGATIVE Sensitive     * >=100,000 COLONIES/mL STAPHYLOCOCCUS EPIDERMIDIS  Resp Panel by RT-PCR (Flu A&B, Covid) Nasopharyngeal Swab     Status: None   Collection Time: 12/17/20  5:37 PM   Specimen: Nasopharyngeal Swab; Nasopharyngeal(NP) swabs in vial transport medium   Result Value Ref Range Status   SARS Coronavirus 2 by RT PCR NEGATIVE NEGATIVE Final    Comment: (NOTE) SARS-CoV-2 target nucleic acids are NOT DETECTED.  The SARS-CoV-2 RNA is generally detectable in upper respiratory specimens during the acute phase of infection. The lowest concentration of SARS-CoV-2 viral copies this assay can detect is 138 copies/mL. A negative result does not preclude SARS-Cov-2 infection and should not be used as the sole basis for treatment or other patient management decisions. A negative result may occur with  improper specimen collection/handling, submission of specimen other than nasopharyngeal swab, presence of viral mutation(s) within the areas targeted by this assay, and inadequate number of viral copies(<138 copies/mL). A negative result must be combined with clinical observations, patient history, and epidemiological information. The expected result is Negative.  Fact Sheet for Patients:  EntrepreneurPulse.com.au  Fact Sheet for Healthcare Providers:  IncredibleEmployment.be  This test is no t yet approved or cleared by the [place] FDA and  has been authorized for detection and/or diagnosis of SARS-CoV-2 by FDA under an Emergency Use Authorization (EUA). This EUA will remain  in effect (meaning this test can be used) for the duration of the COVID-19 declaration under Section 564(b)(1) of the Act, 21 U.S.C.section 360bbb-3(b)(1), unless the authorization is terminated  or revoked sooner.       Influenza A by PCR NEGATIVE NEGATIVE Final   Influenza B by PCR NEGATIVE NEGATIVE Final    Comment: (NOTE) The Xpert Xpress SARS-CoV-2/FLU/RSV plus assay is intended as an aid in the diagnosis of influenza from Nasopharyngeal swab specimens and should not be used as a sole basis for treatment. Nasal washings and aspirates are unacceptable for Xpert Xpress SARS-CoV-2/FLU/RSV testing.  Fact Sheet  for  Patients: EntrepreneurPulse.com.au  Fact Sheet for Healthcare Providers: IncredibleEmployment.be  This test is not yet approved or cleared by the [place] FDA and has been authorized for detection and/or diagnosis of SARS-CoV-2 by FDA under an Emergency Use Authorization (EUA). This EUA will remain in effect (meaning this test can be used) for the duration of the COVID-19 declaration under Section 564(b)(1) of the Act, 21 U.S.C. section 360bbb-3(b)(1), unless the authorization is terminated or revoked.  Performed at [facility], [address] [place], [place] [address]     Radiology Studies: No results found.  Scheduled Meds:  enoxaparin (LOVENOX) injection  90 mg Subcutaneous Q24H   ferrous gluconate  324 mg Oral Q breakfast   fluticasone  1 spray Each Nare Daily   folic acid  1 mg Oral Daily   nystatin   Topical BID   tamsulosin  0.4 mg Oral QHS     LOS: 7 days    Time spent: 35 minutes    Barton Dubois, MD [facility]  If 7PM-7AM, please contact night-coverage [email or web address] 12/24/2020, 5:10 PM

## 2020-12-24 NOTE — Progress Notes (Signed)
Patient has been refusing CPAP.  Does not use at home.

## 2020-12-24 NOTE — TOC Progression Note (Signed)
Transition of Care [facility]) - Progression Note    Patient Details  Name: Eddie Fletcher MRN: [id number] Date of Birth: Aug 05, 1955  Transition of [facility]) CM/SW Contact  Shade Flood, LCSW Phone Number: 12/24/2020, 4:10 PM  Clinical Narrative:     TOC following. Informed this AM by Jackelyn Poling at [facility] that pt's insurance was denying SNF rehab auth. Debbie provided information needed for MD to complete the peer to peer appeal process. Updated Dr. Dyann Kief at [facility] of the process. He has tried twice to complete the peer to peer and has not been able to connect with Holland Falling MD.  Updated pt and his wife of insurance denial. Requested that they determine what they would like to do if insurance continues to deny. Options are for pt to return home possibly with a hospital bed being arranged, return to the hotel where he was staying prior to admission, or, private pay for SNF.  Assigned TOC will follow up in AM.  Expected Discharge Plan: [place] Barriers to Discharge: Barriers Resolved  Expected Discharge Plan and Services Expected Discharge Plan: [place]                                               Social Determinants of Health (SDOH) Interventions    Readmission Risk Interventions No flowsheet data found.

## 2020-12-24 NOTE — Progress Notes (Signed)
Physical Therapy Treatment Patient Details Name: Eddie Fletcher MRN: [id number] DOB: [birth year] Today's Date: 12/24/2020    History of Present Illness Eddie Fletcher is a 65 y.o. male who presents with AMS. Of note, pt with multiple hospital encounters for chronic low back pain, most recently here at [facility] and discharged 12/15/20. 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  Patient demonstrates fair/good return for completing BLE ROM/strengthening exercises despite having to lean backwards most of time supporting self with BUE without loss of balance and unable to attempt sit to stand due to increasing low back pain.  Patient demonstrates fair/good return for helping to reposition self in bed using BUE/LE with bed flat.  Patient will benefit from continued physical therapy in hospital and recommended venue below to increase strength, balance, endurance for safe ADLs and gait.      Follow Up Recommendations  SNF     Equipment Recommendations  Rolling walker with 5" wheels    Recommendations for Other Services       Precautions / Restrictions Precautions Precautions: Fall Restrictions Weight Bearing Restrictions: No    Mobility  Bed Mobility Overal bed mobility: Needs Assistance Bed Mobility: Supine to Sit;Sit to Supine     Supine to sit: Mod assist Sit to supine: Mod assist;Max assist   General bed mobility comments: had diffiuclty moving legs back onto bed during sitting to supine due to weakness    Transfers                    Ambulation/Gait                 Stairs             Wheelchair Mobility    Modified Rankin (Stroke Patients Only)       Balance Overall balance assessment: Needs  assistance Sitting-balance support: Feet supported;No upper extremity supported Sitting balance-Leahy Scale: Fair Sitting balance - Comments: fair/good supporting self with BUE                                    Cognition Arousal/Alertness: Awake/alert Behavior During Therapy: WFL for tasks assessed/performed Overall Cognitive Status: Within Functional Limits for tasks assessed                                        Exercises General Exercises - Lower Extremity Long Arc Quad: Seated;AROM;Strengthening;Both;15 reps Hip Flexion/Marching: Seated;AROM;Strengthening;Both;15 reps Toe Raises: Seated;AROM;Strengthening;Both;15 reps Heel Raises: Seated;AROM;Strengthening;Both;15 reps    General Comments        Pertinent Vitals/Pain Pain Assessment: Faces Faces Pain Scale: Hurts whole lot Pain Location: low back Pain Descriptors / Indicators: Guarding;Grimacing;Sharp Pain Intervention(s): Limited activity within patient's tolerance;Monitored during session;Repositioned;Premedicated before session    Home Living                      Prior Function            PT Goals (current goals can now be found in the care plan section) Acute Rehab PT Goals Patient Stated Goal:  regain strength and independence PT Goal Formulation: With patient Time For Goal Achievement: 01/01/21 Potential to Achieve Goals: Good Progress towards PT goals: Progressing toward goals    Frequency    Min 3X/week      PT Plan Current plan remains appropriate    Co-evaluation              AM-PAC PT "6 Clicks" Mobility   Outcome Measure  Help needed turning from your back to your side while in a flat bed without using bedrails?: A Little Help needed moving from lying on your back to sitting on the side of a flat bed without using bedrails?: A Lot Help needed moving to and from a bed to a chair (including a wheelchair)?: Total Help needed standing up from a  chair using your arms (e.g., wheelchair or bedside chair)?: Total Help needed to walk in hospital room?: Total Help needed climbing 3-5 steps with a railing? : Total 6 Click Score: 9    End of Session   Activity Tolerance: Patient tolerated treatment well;Patient limited by fatigue;Patient limited by pain Patient left: in bed;with call bell/phone within reach Nurse Communication: Mobility status PT Visit Diagnosis: Muscle weakness (generalized) (M62.81);Other abnormalities of gait and mobility (R26.89);Unsteadiness on feet (R26.81)     Time: [id number] PT Time Calculation (min) (ACUTE ONLY): 27 min  Charges:  $Therapeutic Exercise: 8-22 mins $Therapeutic Activity: 8-22 mins                     11:20 AM, 12/24/20 Lonell Grandchild, MPT Physical Therapist with [facility] [phone] office [phone] mobile phone

## 2020-12-25 DIAGNOSIS — G9341 Metabolic encephalopathy: Secondary | ICD-10-CM | POA: Diagnosis not present

## 2020-12-25 DIAGNOSIS — N179 Acute kidney failure, unspecified: Secondary | ICD-10-CM | POA: Diagnosis not present

## 2020-12-25 DIAGNOSIS — N390 Urinary tract infection, site not specified: Secondary | ICD-10-CM | POA: Diagnosis not present

## 2020-12-25 DIAGNOSIS — M545 Low back pain, unspecified: Secondary | ICD-10-CM | POA: Diagnosis not present

## 2020-12-25 NOTE — Care Management Important Message (Signed)
Important Message  Patient Details  Name: Eddie Fletcher MRN: [id number] Date of Birth: Jul 09, 1955   Medicare Important Message Given:  Yes     Tommy Medal 12/25/2020, 11:41 AM

## 2020-12-25 NOTE — Progress Notes (Signed)
Pt still refusing CPAP. No unit in room.

## 2020-12-25 NOTE — Progress Notes (Signed)
PROGRESS NOTE    Eddie Fletcher  [id number] DOB: 11-19-1955 DOA: 12/17/2020 PCP: Curly Rim, MD   Brief Narrative:   Eddie Fletcher is a 65 y.o. male with medical history significant for HTN, morbid obesity, sleep apnea. Patient was brought to the ED via EMS reports of altered mental status.  Patient apparently has tried for SNF placement in the past, but was denied multiple bed offers.  He has apparently had multiple falls at home to and has difficulty ambulating.  EMS had noted that he had pinpoint pupils and was given some Narcan with improvement in mentation noted.  His mentation has improved and PT evaluation recommending SNF.  AKI is resolved and patient is near baseline. He is awaiting insurance authorization to go to SNF.  Assessment & Plan:   Principal Problem:   Acute metabolic encephalopathy Active Problems:   Apnea, sleep   Acute kidney injury superimposed on CKD ([place])   Acute lower UTI   Morbid obesity with BMI of 60.0-69.9, adult (HCC)   Multiple falls   Acute metabolic encephalopathy-multifactorial-resolved -Likely a combination of UTI as well as narcotic medications in the setting of AKI -Blood and urine cultures with staph epidermis contaminant -Continue holding psychoactive agents to include gabapentin and Percocet -Patient now complaining of some back pain for which Dilaudid will be given as needed -Outpatient follow-up with neurosurgery/PCP; planning for CT myelogram as an outpatient (see below for details). -Physical therapy and rehabilitation and skilled nursing facility recommended. -Hemodynamically stable for discharge; awaiting final decision from insurance company to pursuit rehabilitation.   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   OSA -Does not wear CPAP at home, and refuses here -Education has been provided.   Chronic low back pain -MRI could not be performed due to patient's body habitus -EDP spoke with neurosurgery who suggested CT myelogram later on as an outpatient -Patient currently having some low back pain for which Dilaudid has been ordered as needed   Multiple falls -PT evaluation recommending SNF -Patient in agreement to pursue rehabilitation and conditioning at skilled nursing facility; prior to admission he was able to ambulate, transfer position and perform some of his activities of daily living with minimal assistance.  At this whole hospitalization and acute illness patient requiring moderate to max assistance as per physical therapy evaluation and is severely weak and deconditioned.   Morbid obesity -Body mass index is 62.84 kg/m. -Low calorie diet, portion control and increase physical activity discussed with patient.     DVT prophylaxis: Lovenox Code Status: Full Family Communication: No family at bedside. Disposition Plan:  Status is: Inpatient   Remains inpatient appropriate because:Altered mental status, IV treatments appropriate due to intensity of illness or inability to take PO, and Inpatient level of care appropriate due to severity of illness   Dispo: The patient is from: Home              Anticipated d/c is to: SNF              Patient currently is not medically stable to d/c.              Difficult to place patient No   Consultants:  None   Procedures:  See below  Antimicrobials:  Anti-infectives (From admission, onward)  Start     Dose/Rate Route Frequency Ordered Stop   12/18/20 1700  cefTRIAXone (ROCEPHIN) 2 g in sodium chloride 0.9 % 100 mL IVPB  Status:  Discontinued        2 g 200 mL/hr over 30 Minutes Intravenous Every 24 hours 12/17/20 1943 12/20/20 1111   12/17/20 1930  cefTRIAXone (ROCEPHIN) 1 g in sodium chloride 0.9 % 100 mL IVPB        Note to Pharmacy: Totalling 2g, adjusted for weight.   1 g 200 mL/hr over 30 Minutes Intravenous  Once 12/17/20 1919 12/17/20 2045   12/17/20 1700  cefTRIAXone (ROCEPHIN) 1 g in sodium chloride 0.9 % 100 mL IVPB        1 g 200 mL/hr over 30 Minutes Intravenous  Once 12/17/20 1650 12/17/20 1759       Subjective: Continue complaining of lower back pain, patient weak, deconditioned and requiring moderate assistance to perform ADLs.  Objective: Vitals:   12/24/20 1410 12/24/20 2025 12/25/20 0354 12/25/20 1356  BP: 140/62 (!) 160/67 137/66 136/61  Pulse: 90 92 94 91  Resp: '18 20 20 20  '$ Temp: 98.7 F (37.1 C) 98 F (36.7 C) 98 F (36.7 C) 98.4 F (36.9 C)  TempSrc: Oral   Oral  SpO2: 92% 92% 92% 92%  Weight:      Height:        Intake/Output Summary (Last 24 hours) at 12/25/2020 1814 Last data filed at 12/25/2020 1300 Gross per 24 hour  Intake 680 ml  Output 800 ml  Net -120 ml   Filed Weights   12/17/20 1341  Weight: (!) 182 kg    Examination: General exam: Alert, awake, oriented x 3; obese, denying chest pain, nausea and shortness of breath.  Complaining of back pain. Respiratory system: Clear to auscultation. Respiratory effort normal.  No using accessory muscle.  No requiring oxygen supplementation. Cardiovascular system:RRR.  Soft systolic murmur appreciated; No rubs or gallops. Unable to assess JVD with body habitus. Gastrointestinal system: Abdomen is obese, nondistended, soft and nontender. No organomegaly or masses felt. Normal bowel sounds heard. Central nervous system: Alert and oriented. No focal neurological deficits. Extremities: No cyanosis or clubbing. Skin: No petechiae. Psychiatry: Judgement and insight appear normal. Mood & affect appropriate.    Data Reviewed: I have personally reviewed following labs and imaging studies  CBC: Recent Labs  Lab 12/20/20 0903  WBC 4.2  HGB 9.6*  HCT 30.6*  MCV 94.7  PLT 63*   Basic Metabolic Panel: Recent  Labs  Lab 12/20/20 0633 12/21/20 0445 12/22/20 0733  NA 140 138 138  K 4.4 4.3 4.9  CL 106 105 107  CO2 '26 26 29  '$ GLUCOSE 99 116* 110*  BUN 48* 43* 41*  CREATININE 2.75* 2.53* 2.63*  CALCIUM 8.2* 8.1* 8.1*  MG 1.9  --   --    GFR: Estimated Creatinine Clearance: 45.2 mL/min (A) (by C-G formula based on SCr of 2.63 mg/dL (H)).  CBG: Recent Labs  Lab 12/19/20 0715 12/21/20 0715 12/22/20 0707 12/23/20 0723  GLUCAP 131* 111* 102* 99   Sepsis Labs:  Recent Results (from the past 240 hour(s))  Culture, blood (Routine X 2) w Reflex to ID Panel     Status: None   Collection Time: 12/17/20  3:29 PM   Specimen: BLOOD LEFT ARM  Result Value Ref Range Status   Specimen Description BLOOD LEFT ARM  Final   Special Requests   Final    BOTTLES DRAWN  AEROBIC AND ANAEROBIC Blood Culture results may not be optimal due to an excessive volume of blood received in culture bottles   Culture   Final    NO GROWTH 5 DAYS Performed at [facility], [address] [place], [place] [address]    Report Status 12/22/2020 FINAL  Final  Culture, blood (Routine X 2) w Reflex to ID Panel     Status: Abnormal   Collection Time: 12/17/20  3:29 PM   Specimen: Right Antecubital; Blood  Result Value Ref Range Status   Specimen Description   Final    RIGHT ANTECUBITAL Performed at [facility], [address] [place], [place] [address]    Special Requests   Final    BOTTLES DRAWN AEROBIC AND ANAEROBIC Blood Culture adequate volume Performed at [facility], [address] [place], [place] [address]    Culture  Setup Time   Final    GRAM POSITIVE COCCI IN CLUSTERS AEROBIC BOTTLE ONLY Gram Stain Report Called to,Read Back By and Verified With: DEAN,P AT [id number] ON 12/18/20 BY HUFFINES,S Performed at [place] ID to follow CRITICAL RESULT CALLED TO, READ BACK BY AND VERIFIED WITHHinton Dyer RN 2141 12/18/20 A BROWNING    Culture (A)  Final    STAPHYLOCOCCUS EPIDERMIDIS THE  SIGNIFICANCE OF ISOLATING THIS ORGANISM FROM A SINGLE VENIPUNCTURE CANNOT BE PREDICTED WITHOUT FURTHER CLINICAL AND CULTURE CORRELATION. SUSCEPTIBILITIES AVAILABLE ONLY ON REQUEST. Performed at [facility], [place] [address], [place], [place] [address]    Report Status 12/20/2020 FINAL  Final  Blood Culture ID Panel (Reflexed)     Status: Abnormal   Collection Time: 12/17/20  3:29 PM  Result Value Ref Range Status   Enterococcus faecalis NOT DETECTED NOT DETECTED Final   Enterococcus Faecium NOT DETECTED NOT DETECTED Final   Listeria monocytogenes NOT DETECTED NOT DETECTED Final   Staphylococcus species DETECTED (A) NOT DETECTED Final    Comment: CRITICAL RESULT CALLED TO, READ BACK BY AND VERIFIED WITHHinton Dyer RN 2141 12/18/20 A BROWNING    Staphylococcus aureus (BCID) NOT DETECTED NOT DETECTED Final   Staphylococcus epidermidis DETECTED (A) NOT DETECTED Final    Comment: CRITICAL RESULT CALLED TO, READ BACK BY AND VERIFIED WITHHinton Dyer RN 2141 12/18/20 A BROWNING    Staphylococcus lugdunensis NOT DETECTED NOT DETECTED Final   Streptococcus species NOT DETECTED NOT DETECTED Final   Streptococcus agalactiae NOT DETECTED NOT DETECTED Final   Streptococcus pneumoniae NOT DETECTED NOT DETECTED Final   Streptococcus pyogenes NOT DETECTED NOT DETECTED Final   A.calcoaceticus-baumannii NOT DETECTED NOT DETECTED Final   Bacteroides fragilis NOT DETECTED NOT DETECTED Final   Enterobacterales NOT DETECTED NOT DETECTED Final   Enterobacter cloacae complex NOT DETECTED NOT DETECTED Final   Escherichia coli NOT DETECTED NOT DETECTED Final   Klebsiella aerogenes NOT DETECTED NOT DETECTED Final   Klebsiella oxytoca NOT DETECTED NOT DETECTED Final   Klebsiella pneumoniae NOT DETECTED NOT DETECTED Final   Proteus species NOT DETECTED NOT DETECTED Final   Salmonella species NOT DETECTED NOT DETECTED Final   Serratia marcescens NOT DETECTED NOT DETECTED Final   Haemophilus influenzae NOT  DETECTED NOT DETECTED Final   Neisseria meningitidis NOT DETECTED NOT DETECTED Final   Pseudomonas aeruginosa NOT DETECTED NOT DETECTED Final   Stenotrophomonas maltophilia NOT DETECTED NOT DETECTED Final   Candida albicans NOT DETECTED NOT DETECTED Final   Candida auris NOT DETECTED NOT DETECTED Final   Candida glabrata NOT DETECTED NOT DETECTED Final  Candida krusei NOT DETECTED NOT DETECTED Final   Candida parapsilosis NOT DETECTED NOT DETECTED Final   Candida tropicalis NOT DETECTED NOT DETECTED Final   Cryptococcus neoformans/gattii NOT DETECTED NOT DETECTED Final   Methicillin resistance mecA/C NOT DETECTED NOT DETECTED Final    Comment: Performed at [facility], [place] [address], [place], [place] [address]  Urine Culture     Status: Abnormal   Collection Time: 12/17/20  3:47 PM   Specimen: Urine, Catheterized  Result Value Ref Range Status   Specimen Description   Final    URINE, CATHETERIZED Performed at [facility], [address] [place], [place] [address]    Special Requests   Final    NONE Performed at [facility], [address], [place], [place] [address]    Culture >=100,000 COLONIES/mL STAPHYLOCOCCUS EPIDERMIDIS (A)  Final   Report Status 12/20/2020 FINAL  Final   Organism ID, Bacteria STAPHYLOCOCCUS EPIDERMIDIS (A)  Final      Susceptibility   Staphylococcus epidermidis - MIC*    CIPROFLOXACIN >=8 RESISTANT Resistant     GENTAMICIN <=0.5 SENSITIVE Sensitive     NITROFURANTOIN <=16 SENSITIVE Sensitive     OXACILLIN <=0.25 SENSITIVE Sensitive     TETRACYCLINE >=16 RESISTANT Resistant     VANCOMYCIN 1 SENSITIVE Sensitive     TRIMETH/SULFA 20 SENSITIVE Sensitive     CLINDAMYCIN >=8 RESISTANT Resistant     RIFAMPIN <=0.5 SENSITIVE Sensitive     Inducible Clindamycin NEGATIVE Sensitive     * >=100,000 COLONIES/mL STAPHYLOCOCCUS EPIDERMIDIS  Resp Panel by RT-PCR (Flu A&B, Covid) Nasopharyngeal Swab     Status: None   Collection Time: 12/17/20  5:37 PM    Specimen: Nasopharyngeal Swab; Nasopharyngeal(NP) swabs in vial transport medium  Result Value Ref Range Status   SARS Coronavirus 2 by RT PCR NEGATIVE NEGATIVE Final    Comment: (NOTE) SARS-CoV-2 target nucleic acids are NOT DETECTED.  The SARS-CoV-2 RNA is generally detectable in upper respiratory specimens during the acute phase of infection. The lowest concentration of SARS-CoV-2 viral copies this assay can detect is 138 copies/mL. A negative result does not preclude SARS-Cov-2 infection and should not be used as the sole basis for treatment or other patient management decisions. A negative result may occur with  improper specimen collection/handling, submission of specimen other than nasopharyngeal swab, presence of viral mutation(s) within the areas targeted by this assay, and inadequate number of viral copies(<138 copies/mL). A negative result must be combined with clinical observations, patient history, and epidemiological information. The expected result is Negative.  Fact Sheet for Patients:  EntrepreneurPulse.com.au  Fact Sheet for Healthcare Providers:  IncredibleEmployment.be  This test is no t yet approved or cleared by the [place] FDA and  has been authorized for detection and/or diagnosis of SARS-CoV-2 by FDA under an Emergency Use Authorization (EUA). This EUA will remain  in effect (meaning this test can be used) for the duration of the COVID-19 declaration under Section 564(b)(1) of the Act, 21 U.S.C.section 360bbb-3(b)(1), unless the authorization is terminated  or revoked sooner.       Influenza A by PCR NEGATIVE NEGATIVE Final   Influenza B by PCR NEGATIVE NEGATIVE Final    Comment: (NOTE) The Xpert Xpress SARS-CoV-2/FLU/RSV plus assay is intended as an aid in the diagnosis of influenza from Nasopharyngeal swab specimens and should not be used as a sole basis for treatment. Nasal washings and aspirates are  unacceptable for Xpert Xpress SARS-CoV-2/FLU/RSV testing.  Fact Sheet for Patients:  EntrepreneurPulse.com.au  Fact Sheet for Healthcare Providers: IncredibleEmployment.be  This test is not yet approved or cleared by the [place] FDA and has been authorized for detection and/or diagnosis of SARS-CoV-2 by FDA under an Emergency Use Authorization (EUA). This EUA will remain in effect (meaning this test can be used) for the duration of the COVID-19 declaration under Section 564(b)(1) of the Act, 21 U.S.C. section 360bbb-3(b)(1), unless the authorization is terminated or revoked.  Performed at [facility], [address] [place], [place] [address]     Radiology Studies: No results found.  Scheduled Meds:  enoxaparin (LOVENOX) injection  90 mg Subcutaneous Q24H   ferrous gluconate  324 mg Oral Q breakfast   fluticasone  1 spray Each Nare Daily   folic acid  1 mg Oral Daily   nystatin   Topical BID   tamsulosin  0.4 mg Oral QHS     LOS: 8 days    Time spent: 35 minutes    Barton Dubois, MD [facility]  If 7PM-7AM, please contact night-coverage [email or web address] 12/25/2020, 6:14 PM

## 2020-12-25 NOTE — TOC Progression Note (Signed)
Transition of Care [facility]) - Progression Note    Patient Details  Name: Eddie Fletcher MRN: [id number] Date of Birth: 03-08-1956  [facility]) CM/SW Contact  Salome Arnt, [place] Phone Number: 12/25/2020, 12:35 PM  Clinical Narrative:  LCSW called [employer] this morning regarding peer-to-peer as MD said he has not received a call. Per Holland Falling, they have MD number and will call today to complete peer-to-peer.      Expected Discharge Plan: Skilled Nursing Facility Barriers to Discharge: Barriers Resolved  Expected Discharge Plan and Services Expected Discharge Plan: [place]                                               Social Determinants of Health (SDOH) Interventions    Readmission Risk Interventions No flowsheet data found.

## 2020-12-26 ENCOUNTER — Encounter (HOSPITAL_COMMUNITY): Payer: Self-pay | Admitting: Internal Medicine

## 2020-12-26 DIAGNOSIS — G9341 Metabolic encephalopathy: Secondary | ICD-10-CM | POA: Diagnosis not present

## 2020-12-26 DIAGNOSIS — N390 Urinary tract infection, site not specified: Secondary | ICD-10-CM | POA: Diagnosis not present

## 2020-12-26 DIAGNOSIS — M545 Low back pain, unspecified: Secondary | ICD-10-CM | POA: Diagnosis not present

## 2020-12-26 DIAGNOSIS — N179 Acute kidney failure, unspecified: Secondary | ICD-10-CM | POA: Diagnosis not present

## 2020-12-26 LAB — CREATININE, SERUM
Creatinine, Ser: 2.42 mg/dL — ABNORMAL HIGH (ref 0.61–1.24)
GFR, Estimated: 29 mL/min — ABNORMAL LOW (ref >60)

## 2020-12-26 LAB — GLUCOSE, CAPILLARY
Glucose-Capillary: 89 mg/dL (ref 70–99)
Glucose-Capillary: 95 mg/dL (ref 70–99)
Glucose-Capillary: 110 mg/dL — ABNORMAL HIGH (ref 70–99)
Glucose-Capillary: 125 mg/dL — ABNORMAL HIGH (ref 70–99)

## 2020-12-26 NOTE — TOC Progression Note (Signed)
Transition of Care [facility]) - Progression Note    Patient Details  Name: Eddie Fletcher MRN: [id number] Date of Birth: Oct 16, 1955  Transition [facility]) CM/SW Contact  Joaquin Courts, RN Phone Number: 12/26/2020, 2:56 PM  Clinical Narrative:    [facility] supervisor reached out to [facility] regarding delays in peer to peer.  Provider reports Holland Falling has not reached out to complete peer to peer process.  [employer] rep reports [employer] MD called on 8/17, was not able to reach provider by phone and no option leave VM.  Per [place], [place] provider called back on 8/17 and provider new phone number.  [employer] physician called on 8/18 and was not able to reach provider or leave a VM.  At this time the denial letter has been issued per [facility] and peer to peer window has closed.  Per Holland Falling only next option is to request an expedited appeal at phone # [phone] for case id [id number].  This information was communicated to [place] working with patient.    Expected Discharge Plan: Skilled Nursing Facility Barriers to Discharge: Insurance Authorization  Expected Discharge Plan and Services Expected Discharge Plan: [place]                                               Social Determinants of Health (SDOH) Interventions    Readmission Risk Interventions No flowsheet data found.

## 2020-12-26 NOTE — Plan of Care (Signed)

## 2020-12-26 NOTE — Progress Notes (Signed)
No CPAP in room due to patient refusing.

## 2020-12-26 NOTE — TOC Progression Note (Signed)
Transition of Care [facility]) - Progression Note    Patient Details  Name: Eddie Fletcher MRN: [id number] Date of Birth: 1956-01-08  [facility]) CM/SW Contact  Shade Flood, LCSW Phone Number: 12/26/2020, 4:07 PM  Clinical Narrative:     TOC following. Attempted to follow up on expedited appeal option. MD following up as well. This LCSW unable to initiate the expedited appeal as [employer] representatives kept transferring the call, providing a different number for this LCSW to call, disconnecting the call, etc. Updated TOC supervisor and MD. MD states he was attempting to call in the expedited appeal as well and experiencing similar transferring of calls, etc.   Spoke with pt and his wife. If SNF auth continues to be denied, pt will dc to the [facility] in [place]. He will need EMS transport. Weekend TOC will assist.  Expected Discharge Plan: Skilled Nursing Facility Barriers to Discharge: Insurance Authorization  Expected Discharge Plan and Services Expected Discharge Plan: [place]                                               Social Determinants of Health (SDOH) Interventions    Readmission Risk Interventions No flowsheet data found.

## 2020-12-26 NOTE — Progress Notes (Signed)
PROGRESS NOTE    Eddie Fletcher  [id number] DOB: 1955/07/12 DOA: 12/17/2020 PCP: Curly Rim, MD   Brief Narrative:   Eddie Fletcher is a 65 y.o. male with medical history significant for HTN, morbid obesity, sleep apnea. Patient was brought to the ED via EMS reports of altered mental status.  Patient apparently has tried for SNF placement in the past, but was denied multiple bed offers.  He has apparently had multiple falls at home to and has difficulty ambulating.  EMS had noted that he had pinpoint pupils and was given some Narcan with improvement in mentation noted.  His mentation has improved and PT evaluation recommending SNF.  AKI is resolved and patient is near baseline. He is awaiting insurance authorization to go to SNF.  Assessment & Plan:   Principal Problem:   Acute metabolic encephalopathy Active Problems:   Apnea, sleep   Acute kidney injury superimposed on CKD ([place])   Acute lower UTI   Morbid obesity with BMI of 60.0-69.9, adult (HCC)   Multiple falls   Acute metabolic encephalopathy-multifactorial-resolved -Likely a combination of UTI as well as narcotic medications in the setting of AKI -Blood and urine cultures with staph epidermis contaminant -Continue holding psychoactive agents to include gabapentin and Percocet -Patient now complaining of some back pain for which Dilaudid will be given as needed -Outpatient follow-up with neurosurgery/PCP; planning for CT myelogram as an outpatient (see below for details). -Physical therapy and rehabilitation and skilled nursing facility recommended. -Hemodynamically stable for discharge; awaiting final decision from insurance company to pursuit rehabilitation.   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   OSA -Does not wear CPAP at home, and refuses here -Education has been provided.   Chronic low back pain -MRI could not be performed due to patient's body habitus -EDP spoke with neurosurgery who suggested CT myelogram later on as an outpatient -Patient currently having some low back pain for which Dilaudid has been ordered as needed   Multiple falls -PT evaluation recommending SNF -Patient in agreement to pursue rehabilitation and conditioning at skilled nursing facility; prior to admission he was able to ambulate, transfer position and perform some of his activities of daily living with minimal assistance.  At this whole hospitalization and acute illness patient requiring moderate to max assistance as per physical therapy evaluation and is severely weak and deconditioned.   Morbid obesity -Body mass index is 62.84 kg/m. -Low calorie diet, portion control and increase physical activity discussed with patient.     DVT prophylaxis: Lovenox Code Status: Full Family Communication: No family at bedside. Disposition Plan:  Status is: Inpatient   Remains inpatient appropriate because:Altered mental status, IV treatments appropriate due to intensity of illness or inability to take PO, and Inpatient level of care appropriate due to severity of illness   Dispo: The patient is from: Home              Anticipated d/c is to: SNF              Patient currently is not medically stable to d/c.              Difficult to place patient No   Consultants:  None   Procedures:  See below  Antimicrobials:  Anti-infectives (From admission, onward)  Start     Dose/Rate Route Frequency Ordered Stop   12/18/20 1700  cefTRIAXone (ROCEPHIN) 2 g in sodium chloride 0.9 % 100 mL IVPB  Status:  Discontinued        2 g 200 mL/hr over 30 Minutes Intravenous Every 24 hours 12/17/20 1943 12/20/20 1111   12/17/20 1930  cefTRIAXone (ROCEPHIN) 1 g in sodium chloride 0.9 % 100 mL IVPB        Note to Pharmacy: Totalling 2g, adjusted for weight.   1 g 200 mL/hr over 30 Minutes Intravenous  Once 12/17/20 1919 12/17/20 2045   12/17/20 1700  cefTRIAXone (ROCEPHIN) 1 g in sodium chloride 0.9 % 100 mL IVPB        1 g 200 mL/hr over 30 Minutes Intravenous  Once 12/17/20 1650 12/17/20 1759       Subjective:  No fever, no chest pain, no nausea, no vomiting.  Continue complaining of intermittent lower back pain, physically deconditioned, weak and requiring assistance to perform ADL's.    Objective: Vitals:   12/25/20 1356 12/25/20 2328 12/26/20 0617 12/26/20 1256  BP: 136/61 (!) 142/73 133/65 123/61  Pulse: 91 91 86 84  Resp: '20 20 19 18  '$ Temp: 98.4 F (36.9 C) 98.9 F (37.2 C) 98.4 F (36.9 C) 98.5 F (36.9 C)  TempSrc: Oral Oral Oral Oral  SpO2: 92% 92% (!) 86% 95%  Weight:      Height:        Intake/Output Summary (Last 24 hours) at 12/26/2020 1524 Last data filed at 12/26/2020 1400 Gross per 24 hour  Intake 720 ml  Output 1000 ml  Net -280 ml   Filed Weights   12/17/20 1341  Weight: (!) 182 kg    Examination: General exam: Alert, awake, oriented x 3, in no distress. Afebrile, weak and deconditioned.  Respiratory system: Clear to auscultation. Respiratory effort normal. No using accessory muscles.  Cardiovascular system:RRR. No rubs, no gallops, positive SEM. Gastrointestinal system: Abdomen is obese, nondistended, soft and nontender. No organomegaly or masses felt. Normal bowel sounds heard. Central nervous system: Alert and oriented. No focal neurological deficits. Extremities: No cyanosis or clubbing. Skin: No petechiae. Psychiatry: Judgement and insight appear normal. Mood & affect appropriate.    Data Reviewed: I have personally reviewed following labs and imaging studies  CBC: Recent Labs  Lab 12/20/20 0903  WBC 4.2  HGB 9.6*  HCT 30.6*  MCV 94.7  PLT 63*   Basic Metabolic Panel: Recent Labs  Lab 12/20/20 0633 12/21/20 0445  12/22/20 0733 12/26/20 0619  NA 140 138 138  --   K 4.4 4.3 4.9  --   CL 106 105 107  --   CO2 '26 26 29  '$ --   GLUCOSE 99 116* 110*  --   BUN 48* 43* 41*  --   CREATININE 2.75* 2.53* 2.63* 2.42*  CALCIUM 8.2* 8.1* 8.1*  --   MG 1.9  --   --   --    GFR: Estimated Creatinine Clearance: 49.1 mL/min (A) (by C-G formula based on SCr of 2.42 mg/dL (H)).  CBG: Recent Labs  Lab 12/22/20 0707 12/23/20 0723 12/26/20 0620 12/26/20 0709 12/26/20 1058  GLUCAP 102* 99 89 95 110*   Sepsis Labs:  Recent Results (from the past 240 hour(s))  Culture, blood (Routine X 2) w Reflex to ID Panel     Status: None   Collection Time: 12/17/20  3:29 PM   Specimen: BLOOD LEFT ARM  Result Value Ref  Range Status   Specimen Description BLOOD LEFT ARM  Final   Special Requests   Final    BOTTLES DRAWN AEROBIC AND ANAEROBIC Blood Culture results may not be optimal due to an excessive volume of blood received in culture bottles   Culture   Final    NO GROWTH 5 DAYS Performed at [facility], [address], [place], [place] [address]    Report Status 12/22/2020 FINAL  Final  Culture, blood (Routine X 2) w Reflex to ID Panel     Status: Abnormal   Collection Time: 12/17/20  3:29 PM   Specimen: Right Antecubital; Blood  Result Value Ref Range Status   Specimen Description   Final    RIGHT ANTECUBITAL Performed at [facility], [address] [place], [place] [address]    Special Requests   Final    BOTTLES DRAWN AEROBIC AND ANAEROBIC Blood Culture adequate volume Performed at [facility], [address] [place], [place] [address]    Culture  Setup Time   Final    GRAM POSITIVE COCCI IN CLUSTERS AEROBIC BOTTLE ONLY Gram Stain Report Called to,Read Back By and Verified With: DEAN,P AT [id number] ON 12/18/20 BY HUFFINES,S Performed at [place] ID to follow CRITICAL RESULT CALLED TO, READ BACK BY AND VERIFIED WITHHinton Dyer RN 2141 12/18/20 A BROWNING    Culture (A)  Final     STAPHYLOCOCCUS EPIDERMIDIS THE SIGNIFICANCE OF ISOLATING THIS ORGANISM FROM A SINGLE VENIPUNCTURE CANNOT BE PREDICTED WITHOUT FURTHER CLINICAL AND CULTURE CORRELATION. SUSCEPTIBILITIES AVAILABLE ONLY ON REQUEST. Performed at [facility], [place] [address] [place], [place] [address]    Report Status 12/20/2020 FINAL  Final  Blood Culture ID Panel (Reflexed)     Status: Abnormal   Collection Time: 12/17/20  3:29 PM  Result Value Ref Range Status   Enterococcus faecalis NOT DETECTED NOT DETECTED Final   Enterococcus Faecium NOT DETECTED NOT DETECTED Final   Listeria monocytogenes NOT DETECTED NOT DETECTED Final   Staphylococcus species DETECTED (A) NOT DETECTED Final    Comment: CRITICAL RESULT CALLED TO, READ BACK BY AND VERIFIED WITHHinton Dyer RN 2141 12/18/20 A BROWNING    Staphylococcus aureus (BCID) NOT DETECTED NOT DETECTED Final   Staphylococcus epidermidis DETECTED (A) NOT DETECTED Final    Comment: CRITICAL RESULT CALLED TO, READ BACK BY AND VERIFIED WITHHinton Dyer RN 2141 12/18/20 A BROWNING    Staphylococcus lugdunensis NOT DETECTED NOT DETECTED Final   Streptococcus species NOT DETECTED NOT DETECTED Final   Streptococcus agalactiae NOT DETECTED NOT DETECTED Final   Streptococcus pneumoniae NOT DETECTED NOT DETECTED Final   Streptococcus pyogenes NOT DETECTED NOT DETECTED Final   A.calcoaceticus-baumannii NOT DETECTED NOT DETECTED Final   Bacteroides fragilis NOT DETECTED NOT DETECTED Final   Enterobacterales NOT DETECTED NOT DETECTED Final   Enterobacter cloacae complex NOT DETECTED NOT DETECTED Final   Escherichia coli NOT DETECTED NOT DETECTED Final   Klebsiella aerogenes NOT DETECTED NOT DETECTED Final   Klebsiella oxytoca NOT DETECTED NOT DETECTED Final   Klebsiella pneumoniae NOT DETECTED NOT DETECTED Final   Proteus species NOT DETECTED NOT DETECTED Final   Salmonella species NOT DETECTED NOT DETECTED Final   Serratia marcescens NOT DETECTED NOT DETECTED Final    Haemophilus influenzae NOT DETECTED NOT DETECTED Final   Neisseria meningitidis NOT DETECTED NOT DETECTED Final   Pseudomonas aeruginosa NOT DETECTED NOT DETECTED Final   Stenotrophomonas maltophilia NOT DETECTED NOT DETECTED Final   Candida albicans  NOT DETECTED NOT DETECTED Final   Candida auris NOT DETECTED NOT DETECTED Final   Candida glabrata NOT DETECTED NOT DETECTED Final   Candida krusei NOT DETECTED NOT DETECTED Final   Candida parapsilosis NOT DETECTED NOT DETECTED Final   Candida tropicalis NOT DETECTED NOT DETECTED Final   Cryptococcus neoformans/gattii NOT DETECTED NOT DETECTED Final   Methicillin resistance mecA/C NOT DETECTED NOT DETECTED Final    Comment: Performed at [facility], [place] [address] [place], [place] [address]  Urine Culture     Status: Abnormal   Collection Time: 12/17/20  3:47 PM   Specimen: Urine, Catheterized  Result Value Ref Range Status   Specimen Description   Final    URINE, CATHETERIZED Performed at [facility], [address], [place], [place] [address]    Special Requests   Final    NONE Performed at [facility], [address], [place], [place] [address]    Culture >=100,000 COLONIES/mL STAPHYLOCOCCUS EPIDERMIDIS (A)  Final   Report Status 12/20/2020 FINAL  Final   Organism ID, Bacteria STAPHYLOCOCCUS EPIDERMIDIS (A)  Final      Susceptibility   Staphylococcus epidermidis - MIC*    CIPROFLOXACIN >=8 RESISTANT Resistant     GENTAMICIN <=0.5 SENSITIVE Sensitive     NITROFURANTOIN <=16 SENSITIVE Sensitive     OXACILLIN <=0.25 SENSITIVE Sensitive     TETRACYCLINE >=16 RESISTANT Resistant     VANCOMYCIN 1 SENSITIVE Sensitive     TRIMETH/SULFA 20 SENSITIVE Sensitive     CLINDAMYCIN >=8 RESISTANT Resistant     RIFAMPIN <=0.5 SENSITIVE Sensitive     Inducible Clindamycin NEGATIVE Sensitive     * >=100,000 COLONIES/mL STAPHYLOCOCCUS EPIDERMIDIS  Resp Panel by RT-PCR (Flu A&B, Covid) Nasopharyngeal Swab     Status: None   Collection  Time: 12/17/20  5:37 PM   Specimen: Nasopharyngeal Swab; Nasopharyngeal(NP) swabs in vial transport medium  Result Value Ref Range Status   SARS Coronavirus 2 by RT PCR NEGATIVE NEGATIVE Final    Comment: (NOTE) SARS-CoV-2 target nucleic acids are NOT DETECTED.  The SARS-CoV-2 RNA is generally detectable in upper respiratory specimens during the acute phase of infection. The lowest concentration of SARS-CoV-2 viral copies this assay can detect is 138 copies/mL. A negative result does not preclude SARS-Cov-2 infection and should not be used as the sole basis for treatment or other patient management decisions. A negative result may occur with  improper specimen collection/handling, submission of specimen other than nasopharyngeal swab, presence of viral mutation(s) within the areas targeted by this assay, and inadequate number of viral copies(<138 copies/mL). A negative result must be combined with clinical observations, patient history, and epidemiological information. The expected result is Negative.  Fact Sheet for Patients:  EntrepreneurPulse.com.au  Fact Sheet for Healthcare Providers:  IncredibleEmployment.be  This test is no t yet approved or cleared by the [place] FDA and  has been authorized for detection and/or diagnosis of SARS-CoV-2 by FDA under an Emergency Use Authorization (EUA). This EUA will remain  in effect (meaning this test can be used) for the duration of the COVID-19 declaration under Section 564(b)(1) of the Act, 21 U.S.C.section 360bbb-3(b)(1), unless the authorization is terminated  or revoked sooner.       Influenza A by PCR NEGATIVE NEGATIVE Final   Influenza B by PCR NEGATIVE NEGATIVE Final    Comment: (NOTE) The Xpert Xpress SARS-CoV-2/FLU/RSV plus assay is intended as an aid in the diagnosis of influenza from Nasopharyngeal swab specimens and should not  be used as a sole basis for treatment. Nasal washings  and aspirates are unacceptable for Xpert Xpress SARS-CoV-2/FLU/RSV testing.  Fact Sheet for Patients: EntrepreneurPulse.com.au  Fact Sheet for Healthcare Providers: IncredibleEmployment.be  This test is not yet approved or cleared by the [place] FDA and has been authorized for detection and/or diagnosis of SARS-CoV-2 by FDA under an Emergency Use Authorization (EUA). This EUA will remain in effect (meaning this test can be used) for the duration of the COVID-19 declaration under Section 564(b)(1) of the Act, 21 U.S.C. section 360bbb-3(b)(1), unless the authorization is terminated or revoked.  Performed at [facility], [address], [place], [place] [address]     Radiology Studies: No results found.  Scheduled Meds:  enoxaparin (LOVENOX) injection  90 mg Subcutaneous Q24H   ferrous gluconate  324 mg Oral Q breakfast   fluticasone  1 spray Each Nare Daily   folic acid  1 mg Oral Daily   nystatin   Topical BID   tamsulosin  0.4 mg Oral QHS     LOS: 9 days    Time spent: 30 minutes    Barton Dubois, MD [facility]  If 7PM-7AM, please contact night-coverage [email or web address] 12/26/2020, 3:24 PM

## 2020-12-27 DIAGNOSIS — G9341 Metabolic encephalopathy: Secondary | ICD-10-CM | POA: Diagnosis not present

## 2020-12-27 DIAGNOSIS — N179 Acute kidney failure, unspecified: Secondary | ICD-10-CM | POA: Diagnosis not present

## 2020-12-27 DIAGNOSIS — N390 Urinary tract infection, site not specified: Secondary | ICD-10-CM | POA: Diagnosis not present

## 2020-12-27 DIAGNOSIS — G4730 Sleep apnea, unspecified: Secondary | ICD-10-CM | POA: Diagnosis not present

## 2020-12-27 MED ORDER — POLYETHYLENE GLYCOL 3350 17 G PO PACK: 17.0000 g | PACK | Freq: Every day | ORAL | 1 refills | Status: AC

## 2020-12-27 MED ORDER — NYSTATIN 100000 UNIT/GM EX POWD: Freq: Two times a day (BID) | CUTANEOUS | 0 refills | Status: AC

## 2020-12-27 MED ORDER — DIPHENHYDRAMINE HCL 25 MG PO CAPS: 25.0000 mg | ORAL_CAPSULE | Freq: Four times a day (QID) | ORAL | 0 refills | Status: AC | PRN

## 2020-12-27 MED ORDER — TRAMADOL HCL 50 MG PO TABS: 50.0000 mg | ORAL_TABLET | Freq: Three times a day (TID) | ORAL | 0 refills | Status: AC | PRN

## 2020-12-27 NOTE — Discharge Summary (Addendum)
Physician Discharge Summary  Eddie Fletcher [id number] DOB: [birth year] DOA: 12/17/2020  PCP: Curly Rim, MD  Admit date: 12/17/2020 Discharge date: 12/28/2020  Time spent: 35 minutes  Recommendations for Outpatient Follow-up:  Repeat basic metabolic panel to follow electrolytes and renal function Outpatient CT myelogram and referral for neurosurgery consultation. Continue assisting with chronic pain management.    Discharge Diagnoses:  Principal Problem:   Acute metabolic encephalopathy Active Problems:   Apnea, sleep   Acute kidney injury superimposed on CKD ([place])   Acute lower UTI   Morbid obesity with BMI of 60.0-69.9, adult ([place])   Multiple falls   Discharge Condition: Stable.  Discharge home with instruction to follow-up with PCP in 10 days.  Code status: full code.  Diet recommendation: Heart healthy/low calorie diet.  Filed Weights   12/17/20 1341  Weight: (!) 182 kg    History of present illness:  As per H&P written by Dr. Denton Brick on 12/17/2020 Eddie Fletcher is a 65 y.o. male with medical history significant for HTN, morbid obesity, sleep apnea. Patient was brought to the ED via EMS reports of altered mental status.  At the time of my evaluation, spouse at bedside, patient is sleeping but easily arousable, answers questions appropriately, but easily drifts back to sleep.  He denies pain.  No difficulty breathing.  No cough.  No chest pain.  No headaches.  No dysuria.  He denies taking any pain medications today.    Patient lives in [place], but he has been residing at [facility] since he was discharged from [facility] ED 8/8-after boarding here for 3 days with plans for SNF placement which was unsuccessful because of multiple denied bed offers.  Spouse reports that patient is unable to move around in the house. (Per notes- Hoarding) Patient had an appointment scheduled with his primary care provider for today, his transportation arrived, and he was found to be  unresponsive, and they called EMS.   Patient spouse reports multiple falls.  He fell this morning at about 8 AM and hit his head on the nightstand.  She believes he was confused/altered at that time.  She is unaware of how many pain medications patient took today, but says he took it last night.  Patient has barely ambulated since he was discharged from the ER, at baseline he uses a wheelchair Rollator and walker.  Spouse reports patient has at least 2 bottles of pain medications currently.    Patient drove to [place] last week for his father's funeral.  Patient has had 8 ER visits in different/multiple facilities since 28 July-  From [place] to here in [place], complaining of back pain.  Please see detailed hospitalist consult note from 12/12/2020.  Hospital Course:  Acute metabolic encephalopathy -multifactorial-resolved at time of discharge. -Likely a combination of UTI as well as narcotic medications in the setting of AKI on chronic kidney disease. -Patient has completed antibiotic therapy for UTI. -Will continue minimizing the use of narcotics and will be judicious with psychotropic medications. -Outpatient follow-up with neurosurgery/PCP; planning for CT myelogram as an outpatient (see below for details). -Home health services arranged at time of discharge. -Hemodynamically stable and in no major distress.   UTI -Patient has completed antibiotics course of treatment with Rocephin -Urine culture with staph epidermidis  -Advised to maintain adequate hydration.   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   Chronic low back pain -MRI could not be performed due to patient's body habitus -EDP spoke with neurosurgery who suggested  CT myelogram as an outpatient -Patient currently having some low back pain for which tylenol and tramadol will be used.   Multiple falls -PT evaluation recommending SNF -Patient in agreement to pursuit rehabilitation and conditioning at skilled nursing facility; prior to admission he was able to ambulate, transfer position and perform some of his activities of daily living with minimal assistance.  After this whole hospitalization and acute illness patient requiring moderate to max assistance as per physical therapy evaluation and is severely weak and deconditioned. [facility] declined patient's placement, and patient did not have the funds to pay out-of-pocket. -Will arrange for home health services and patient will follow-up with his PCP. -Social support and overall living situation not ideal currently.   Morbid obesity -Body mass index is 62.84 kg/m. -Low calorie diet, portion control and increase physical activity discussed with patient.  Procedures: See below for x-ray reports.  Consultations: None  Discharge Exam: Vitals:   12/26/20 2046 12/27/20 0507  BP: (!) 159/66 132/60  Pulse: 92 96  Resp: 18 19  Temp: 99 F (37.2 C) 99.2 F (37.3 C)  SpO2: 92% (!) 86%    General exam: Alert, awake, oriented x 3, in no distress. Afebrile, weak and deconditioned.  Respiratory system: Clear to auscultation. Respiratory effort normal. No using accessory muscles.  Cardiovascular system:RRR. No rubs, no gallops, positive SEM. Gastrointestinal system: Abdomen is obese, nondistended, soft and nontender. No organomegaly or masses felt. Normal bowel sounds heard. Central nervous system: Alert and oriented. No focal neurological deficits. Extremities: No cyanosis or clubbing. Skin: No petechiae. Psychiatry: Judgement and insight appear normal. Mood & affect appropriate.   Discharge Instructions   Discharge Instructions     Diet - low sodium heart healthy   Complete by: As  directed    Discharge instructions   Complete by: As directed    Arrange follow-up with PCP in 10 days Take medications as prescribed Maintain adequate hydration      Allergies as of 12/27/2020       Reactions   Latex Other (See Comments)   Latex cath caused burning and irritation Latex cath caused burning and irritation   Levaquin [levofloxacin In D5w] Other (See Comments)   Muscle aches   Keflex [cephalexin] Rash   Lisinopril Cough   Sulfa Antibiotics Rash        Medication List     STOP taking these medications    cyclobenzaprine 10 MG tablet Commonly known as: FLEXERIL   doxycycline 100 MG tablet Commonly known as: VIBRA-TABS   HYDROcodone-acetaminophen 10-325 MG tablet Commonly known as: NORCO   oxyCODONE-acetaminophen 5-325 MG tablet Commonly known as: Roxicet       TAKE these medications    acetaminophen 500 MG tablet Commonly known as: TYLENOL Take 1,000 mg by mouth every 6 (six) hours as needed for moderate pain.   albuterol 108 (90 Base) MCG/ACT inhaler Commonly known as: VENTOLIN HFA Inhale 1-2 puffs into the lungs every 6 (six) hours as needed for wheezing or shortness of breath.   cholecalciferol 1000 units tablet Commonly known as: VITAMIN D Take 1,000 Units by mouth daily.   Cholecalciferol 50 MCG (2000 UT) Tabs Take by mouth.   clotrimazole 1 % cream Commonly known as: LOTRIMIN Apply 1  application topically 2 (two) times daily.   diphenhydrAMINE 25 mg capsule Commonly known as: BENADRYL Take 1 capsule (25 mg total) by mouth every 6 (six) hours as needed for itching or allergies.   ferrous gluconate 324 MG tablet Commonly known as: FERGON Take by mouth.   fluticasone 50 MCG/ACT nasal spray Commonly known as: FLONASE Place 1 spray into both nostrils daily.   folic acid 1 MG tablet Commonly known as: FOLVITE Take 1 mg by mouth daily.   gabapentin 100 MG capsule Commonly known as: NEURONTIN Take 100 mg by mouth 2 (two)  times daily.   nystatin powder Commonly known as: MYCOSTATIN/NYSTOP Apply topically 2 (two) times daily.   omeprazole 20 MG capsule Commonly known as: PRILOSEC Take 20 mg by mouth daily.   polyethylene glycol 17 g packet Commonly known as: MIRALAX / GLYCOLAX Take 17 g by mouth daily.   tamsulosin 0.4 MG Caps capsule Commonly known as: FLOMAX Take 0.4 mg by mouth at bedtime.   traMADol 50 MG tablet Commonly known as: Ultram Take 1 tablet (50 mg total) by mouth every 8 (eight) hours as needed for severe pain.       Allergies  Allergen Reactions   Latex Other (See Comments)    Latex cath caused burning and irritation Latex cath caused burning and irritation    Levaquin [Levofloxacin In D5w] Other (See Comments)    Muscle aches   Keflex [Cephalexin] Rash   Lisinopril Cough   Sulfa Antibiotics Rash    Contact information for follow-up providers     [name], Kip A, MD. Schedule an appointment as soon as possible for a visit in 10 day(s).   Specialty: Family Medicine Contact information: [phone] [place] [phone]              Contact information for after-discharge care     [facility] .   Service: Skilled Nursing Contact information: [address] [place] [phone]                     The results of significant diagnostics from this hospitalization (including imaging, microbiology, ancillary and laboratory) are listed below for reference.    Significant Diagnostic Studies: CT Head Wo Contrast  Result Date: 12/17/2020 CLINICAL DATA:  Altered mental status EXAM: CT HEAD WITHOUT CONTRAST TECHNIQUE: Contiguous axial images were obtained from the base of the skull through the vertex without intravenous contrast. COMPARISON:  CT head 12/03/2003 FINDINGS: Brain: There is no acute intracranial hemorrhage, extra-axial fluid collection,  or infarct. No mass lesion is identified. The ventricles are not enlarged. There is no midline shift. Vascular: No hyperdense vessel or unexpected calcification. Skull: Normal. Negative for fracture or focal lesion. Sinuses/Orbits: There is minimal mucosal thickening in the paranasal sinuses. Bilateral lens implants are noted. The orbits and globes are otherwise unremarkable. Other: There is trace fluid in the left mastoid air cells. Fluid is also seen in the left middle ear cavity. IMPRESSION: 1. No acute intracranial pathology. 2. Small left mastoid and middle ear effusions. Correlate with any symptoms. Electronically Signed   By: Valetta Mole MD   On: 12/17/2020 15:35   CT Cervical Spine Wo Contrast  Result Date: 12/17/2020 CLINICAL DATA:  Poly trauma EXAM: CT CERVICAL SPINE WITHOUT CONTRAST TECHNIQUE: Multidetector CT imaging of the cervical spine was performed without intravenous contrast. Multiplanar CT image reconstructions were also  generated. COMPARISON:  Cervical spine radiographs 11/30/2003 FINDINGS: Alignment: There is straightening of the normal cervical spine lordosis. There is no anterior or retrolisthesis. Skull base and vertebrae: Vertebral body heights are preserved. There is no evidence of acute fracture. There is no suspicious osseous lesion. Soft tissues and spinal canal: No prevertebral fluid or swelling. No visible canal hematoma. Disc levels: There is intervertebral disc space narrowing with associated uncovertebral arthropathy at C5-C6 and C6-C7. The osseous spinal canal is patent. There is moderate to severe right neural foraminal stenosis at C5-C6. Upper chest: The lung apices are not imaged. Other: None. IMPRESSION: 1. No acute fracture or traumatic malalignment of the cervical spine. 2. Degenerative changes most advanced at C5-C6 where there is moderate to severe right neural foraminal stenosis. Electronically Signed   By: Valetta Mole MD   On: 12/17/2020 15:39   CT Lumbar Spine Wo  Contrast  Result Date: 12/12/2020 CLINICAL DATA:  Fall with back pain EXAM: CT LUMBAR SPINE WITHOUT CONTRAST TECHNIQUE: Multidetector CT imaging of the lumbar spine was performed without intravenous contrast administration. Multiplanar CT image reconstructions were also generated. COMPARISON:  MRI 04/22/2014, CT 12/09/2020 FINDINGS: Segmentation: 5 lumbar type vertebrae. Alignment: Mild scoliosis.  Sagittal alignment within normal limits. Vertebrae: No acute fracture or focal pathologic process. Paraspinal and other soft tissues: No paravertebral or paraspinal soft tissue abnormality. Left kidney stone or intrarenal vascular calcification. Disc levels: At L1-L2, maintained disc space. Degenerative osteophyte. No significant canal stenosis or foraminal narrowing. At L2-L3, maintained disc space. No significant canal stenosis. The foramen are patent bilaterally. At L3-L4, disc space narrowing and osteophyte. Right foraminal disc protrusion and spur spurring. Mild to moderate right foraminal narrowing. At L4-L5, mild disc space narrowing and osteophyte. Right foraminal disc protrusion and spurring with moderate foraminal narrowing. At L5 S1, moderate disc space narrowing and spurring. No significant canal stenosis. Facet degenerative changes. Severe bilateral foraminal narrowing IMPRESSION: 1. No acute osseous abnormality. MRI follow-up as indicated given history. 2. Multilevel degenerative change with severe bilateral foraminal stenosis at L5-S1. Electronically Signed   By: Donavan Foil M.D.   On: 12/12/2020 19:02   DG Chest Port 1 View  Result Date: 12/17/2020 CLINICAL DATA:  Altered mental status EXAM: PORTABLE CHEST 1 VIEW COMPARISON:  Chest radiograph 01/09/2015 FINDINGS: The heart is markedly enlarged. The mediastinal contours are otherwise within normal limits. There is pulmonary vascular congestion with likely mild pulmonary interstitial edema. There is probably a left pleural effusion. There is no  definite right effusion. There is no pneumothorax. Retrocardiac opacity may reflect atelectasis or pneumonia. There is no acute osseous abnormality. IMPRESSION: 1. Marked cardiomegaly (progressed since 2016) with vascular congestion and probable mild pulmonary interstitial edema and a left pleural effusion. 2. Retrocardiac opacity may reflect atelectasis or pneumonia in the correct clinical setting. Electronically Signed   By: Valetta Mole MD   On: 12/17/2020 15:30    Microbiology: Recent Results (from the past 240 hour(s))  Culture, blood (Routine X 2) w Reflex to ID Panel     Status: None   Collection Time: 12/17/20  3:29 PM   Specimen: BLOOD LEFT ARM  Result Value Ref Range Status   Specimen Description BLOOD LEFT ARM  Final   Special Requests   Final    BOTTLES DRAWN AEROBIC AND ANAEROBIC Blood Culture results may not be optimal due to an excessive volume of blood received in culture bottles   Culture   Final    NO GROWTH 5 DAYS Performed  at [facility], [address] [place], [place] [address]    Report Status 12/22/2020 FINAL  Final  Culture, blood (Routine X 2) w Reflex to ID Panel     Status: Abnormal   Collection Time: 12/17/20  3:29 PM   Specimen: Right Antecubital; Blood  Result Value Ref Range Status   Specimen Description   Final    RIGHT ANTECUBITAL Performed at [facility], [address] [place], [place] [address]    Special Requests   Final    BOTTLES DRAWN AEROBIC AND ANAEROBIC Blood Culture adequate volume Performed at [facility], [address] [place], [place] [address]    Culture  Setup Time   Final    GRAM POSITIVE COCCI IN CLUSTERS AEROBIC BOTTLE ONLY Gram Stain Report Called to,Read Back By and Verified With: DEAN,P AT [id number] ON 12/18/20 BY HUFFINES,S Performed at [place] ID to follow CRITICAL RESULT CALLED TO, READ BACK BY AND VERIFIED WITHHinton Dyer RN 2141 12/18/20 A BROWNING    Culture (A)  Final    STAPHYLOCOCCUS  EPIDERMIDIS THE SIGNIFICANCE OF ISOLATING THIS ORGANISM FROM A SINGLE VENIPUNCTURE CANNOT BE PREDICTED WITHOUT FURTHER CLINICAL AND CULTURE CORRELATION. SUSCEPTIBILITIES AVAILABLE ONLY ON REQUEST. Performed at [facility], [place] [address] [place], [place] [address]    Report Status 12/20/2020 FINAL  Final  Blood Culture ID Panel (Reflexed)     Status: Abnormal   Collection Time: 12/17/20  3:29 PM  Result Value Ref Range Status   Enterococcus faecalis NOT DETECTED NOT DETECTED Final   Enterococcus Faecium NOT DETECTED NOT DETECTED Final   Listeria monocytogenes NOT DETECTED NOT DETECTED Final   Staphylococcus species DETECTED (A) NOT DETECTED Final    Comment: CRITICAL RESULT CALLED TO, READ BACK BY AND VERIFIED WITHHinton Dyer RN 2141 12/18/20 A BROWNING    Staphylococcus aureus (BCID) NOT DETECTED NOT DETECTED Final   Staphylococcus epidermidis DETECTED (A) NOT DETECTED Final    Comment: CRITICAL RESULT CALLED TO, READ BACK BY AND VERIFIED WITHHinton Dyer RN 2141 12/18/20 A BROWNING    Staphylococcus lugdunensis NOT DETECTED NOT DETECTED Final   Streptococcus species NOT DETECTED NOT DETECTED Final   Streptococcus agalactiae NOT DETECTED NOT DETECTED Final   Streptococcus pneumoniae NOT DETECTED NOT DETECTED Final   Streptococcus pyogenes NOT DETECTED NOT DETECTED Final   A.calcoaceticus-baumannii NOT DETECTED NOT DETECTED Final   Bacteroides fragilis NOT DETECTED NOT DETECTED Final   Enterobacterales NOT DETECTED NOT DETECTED Final   Enterobacter cloacae complex NOT DETECTED NOT DETECTED Final   Escherichia coli NOT DETECTED NOT DETECTED Final   Klebsiella aerogenes NOT DETECTED NOT DETECTED Final   Klebsiella oxytoca NOT DETECTED NOT DETECTED Final   Klebsiella pneumoniae NOT DETECTED NOT DETECTED Final   Proteus species NOT DETECTED NOT DETECTED Final   Salmonella species NOT DETECTED NOT DETECTED Final   Serratia marcescens NOT DETECTED NOT DETECTED Final   Haemophilus  influenzae NOT DETECTED NOT DETECTED Final   Neisseria meningitidis NOT DETECTED NOT DETECTED Final   Pseudomonas aeruginosa NOT DETECTED NOT DETECTED Final   Stenotrophomonas maltophilia NOT DETECTED NOT DETECTED Final   Candida albicans NOT DETECTED NOT DETECTED Final   Candida auris NOT DETECTED NOT DETECTED Final   Candida glabrata NOT DETECTED NOT DETECTED Final   Candida krusei NOT DETECTED NOT DETECTED Final   Candida parapsilosis NOT DETECTED NOT DETECTED Final   Candida tropicalis NOT DETECTED NOT DETECTED Final   Cryptococcus neoformans/gattii NOT DETECTED NOT DETECTED  Final   Methicillin resistance mecA/C NOT DETECTED NOT DETECTED Final    Comment: Performed at [facility], [place] [address] [place], [place] [address]  Urine Culture     Status: Abnormal   Collection Time: 12/17/20  3:47 PM   Specimen: Urine, Catheterized  Result Value Ref Range Status   Specimen Description   Final    URINE, CATHETERIZED Performed at [facility], [address] [place], [place] [address]    Special Requests   Final    NONE Performed at [facility], [address] [place], [place] [address]    Culture >=100,000 COLONIES/mL STAPHYLOCOCCUS EPIDERMIDIS (A)  Final   Report Status 12/20/2020 FINAL  Final   Organism ID, Bacteria STAPHYLOCOCCUS EPIDERMIDIS (A)  Final      Susceptibility   Staphylococcus epidermidis - MIC*    CIPROFLOXACIN >=8 RESISTANT Resistant     GENTAMICIN <=0.5 SENSITIVE Sensitive     NITROFURANTOIN <=16 SENSITIVE Sensitive     OXACILLIN <=0.25 SENSITIVE Sensitive     TETRACYCLINE >=16 RESISTANT Resistant     VANCOMYCIN 1 SENSITIVE Sensitive     TRIMETH/SULFA 20 SENSITIVE Sensitive     CLINDAMYCIN >=8 RESISTANT Resistant     RIFAMPIN <=0.5 SENSITIVE Sensitive     Inducible Clindamycin NEGATIVE Sensitive     * >=100,000 COLONIES/mL STAPHYLOCOCCUS EPIDERMIDIS  Resp Panel by RT-PCR (Flu A&B, Covid) Nasopharyngeal Swab     Status: None   Collection Time: 12/17/20   5:37 PM   Specimen: Nasopharyngeal Swab; Nasopharyngeal(NP) swabs in vial transport medium  Result Value Ref Range Status   SARS Coronavirus 2 by RT PCR NEGATIVE NEGATIVE Final    Comment: (NOTE) SARS-CoV-2 target nucleic acids are NOT DETECTED.  The SARS-CoV-2 RNA is generally detectable in upper respiratory specimens during the acute phase of infection. The lowest concentration of SARS-CoV-2 viral copies this assay can detect is 138 copies/mL. A negative result does not preclude SARS-Cov-2 infection and should not be used as the sole basis for treatment or other patient management decisions. A negative result may occur with  improper specimen collection/handling, submission of specimen other than nasopharyngeal swab, presence of viral mutation(s) within the areas targeted by this assay, and inadequate number of viral copies(<138 copies/mL). A negative result must be combined with clinical observations, patient history, and epidemiological information. The expected result is Negative.  Fact Sheet for Patients:  EntrepreneurPulse.com.au  Fact Sheet for Healthcare Providers:  IncredibleEmployment.be  This test is no t yet approved or cleared by the [place] FDA and  has been authorized for detection and/or diagnosis of SARS-CoV-2 by FDA under an Emergency Use Authorization (EUA). This EUA will remain  in effect (meaning this test can be used) for the duration of the COVID-19 declaration under Section 564(b)(1) of the Act, 21 U.S.C.section 360bbb-3(b)(1), unless the authorization is terminated  or revoked sooner.       Influenza A by PCR NEGATIVE NEGATIVE Final   Influenza B by PCR NEGATIVE NEGATIVE Final    Comment: (NOTE) The Xpert Xpress SARS-CoV-2/FLU/RSV plus assay is intended as an aid in the diagnosis of influenza from Nasopharyngeal swab specimens and should not be used as a sole basis for treatment. Nasal washings and aspirates  are unacceptable for Xpert Xpress SARS-CoV-2/FLU/RSV testing.  Fact Sheet for Patients: EntrepreneurPulse.com.au  Fact Sheet for Healthcare Providers: IncredibleEmployment.be  This test is not yet approved or cleared by the [place] FDA and has been authorized for detection and/or diagnosis of SARS-CoV-2 by  FDA under an Emergency Use Authorization (EUA). This EUA will remain in effect (meaning this test can be used) for the duration of the COVID-19 declaration under Section 564(b)(1) of the Act, 21 U.S.C. section 360bbb-3(b)(1), unless the authorization is terminated or revoked.  Performed at [facility], [address], [place], [place] [address]      Labs: Basic Metabolic Panel: Recent Labs  Lab 12/21/20 0445 12/22/20 0733 12/26/20 0619  NA 138 138  --   K 4.3 4.9  --   CL 105 107  --   CO2 26 29  --   GLUCOSE 116* 110*  --   BUN 43* 41*  --   CREATININE 2.53* 2.63* 2.42*  CALCIUM 8.1* 8.1*  --     BNP (last 3 results) Recent Labs    12/17/20 1532  BNP 1,204.0*    CBG: Recent Labs  Lab 12/23/20 0723 12/26/20 0620 12/26/20 0709 12/26/20 1058 12/26/20 1605  GLUCAP 99 89 95 110* 125*   Signed:  Barton Dubois MD.  [facility] 12/27/2020, 12:18 PM

## 2020-12-27 NOTE — TOC Transition Note (Addendum)
Transition of Care [facility]) - CM/SW Discharge Note   Patient Details  Name: Eddie Fletcher MRN: [id number] Date of Birth: [birth year]  Transition of Care [facility]) CM/SW Contact:  Natasha Bence, LCSW Phone Number: 12/27/2020, 1:31 PM   Clinical Narrative:    CSW notified of patient's readiness for discharge. CSW referred patient to [facility]. Sarah with [facility] agreeable to take patient. CSW referred patient to [employer] for wheelchair. Jasmine with [employer] agreeable to provide wheelchair. CSW completed med necessity and called EMS. TOC signing off.   Final next level of care: Home/Self Care Barriers to Discharge: Barriers Resolved   Patient Goals and CMS Choice Patient states their goals for this hospitalization and ongoing recovery are:: Return home with [facility] CMS Medicare.gov Compare Post Acute Care list provided to:: Patient Choice offered to / list presented to : Patient  Discharge Placement                Patient to be transferred to facility by: [facility] Name of family member notified: Natter,Alicia Patient and family notified of of transfer: 12/27/20  Discharge Plan and Services                          HH Arranged: RN, PT, Social Work, Nurse's Aide [place] Agency: [place] Date [place]: 12/27/20 Time [place]: 1330 Representative spoke with at [place]: [place] Determinants of Health ([place]) Interventions     Readmission Risk Interventions No flowsheet data found.

## 2020-12-27 NOTE — Progress Notes (Signed)
EMS arrived to pick him up for transport and patient notified them that he does not have a room secured at the [facility] in [place] where he will be returning. He has sent his wife to obtain his room. EMS states nursing can call them once he has secured a room and they will return for transport. CSW and provider notified.

## 2020-12-28 LAB — GLUCOSE, CAPILLARY: Glucose-Capillary: 104 mg/dL — ABNORMAL HIGH (ref 70–99)

## 2020-12-28 NOTE — Progress Notes (Signed)
Discharge instructions called and given to spouse Guiseppe Lummis,  instructions given on medications and follow up visits,family verbalized understanding.  [facility] [place] transported patient to [facility].Marland Kitchen

## 2020-12-28 NOTE — Progress Notes (Signed)
Nsg Discharge Note  Admit Date:  12/17/2020 Discharge date: 12/28/2020   Eddie Fletcher to be D/C'd  HOTEL  per MD order.  AVS completed.  Copy for chart, and copy for patient signed, and dated. Patient/caregiver able to verbalize understanding.  Discharge Medication: Allergies as of 12/28/2020       Reactions   Latex Other (See Comments)   Latex cath caused burning and irritation Latex cath caused burning and irritation   Levaquin [levofloxacin In D5w] Other (See Comments)   Muscle aches   Keflex [cephalexin] Rash   Lisinopril Cough   Sulfa Antibiotics Rash        Medication List     STOP taking these medications    cyclobenzaprine 10 MG tablet Commonly known as: FLEXERIL   doxycycline 100 MG tablet Commonly known as: VIBRA-TABS   HYDROcodone-acetaminophen 10-325 MG tablet Commonly known as: NORCO   oxyCODONE-acetaminophen 5-325 MG tablet Commonly known as: Roxicet       TAKE these medications    acetaminophen 500 MG tablet Commonly known as: TYLENOL Take 1,000 mg by mouth every 6 (six) hours as needed for moderate pain.   albuterol 108 (90 Base) MCG/ACT inhaler Commonly known as: VENTOLIN HFA Inhale 1-2 puffs into the lungs every 6 (six) hours as needed for wheezing or shortness of breath.   cholecalciferol 1000 units tablet Commonly known as: VITAMIN D Take 1,000 Units by mouth daily.   Cholecalciferol 50 MCG (2000 UT) Tabs Take by mouth.   clotrimazole 1 % cream Commonly known as: LOTRIMIN Apply 1 application topically 2 (two) times daily.   diphenhydrAMINE 25 mg capsule Commonly known as: BENADRYL Take 1 capsule (25 mg total) by mouth every 6 (six) hours as needed for itching or allergies.   ferrous gluconate 324 MG tablet Commonly known as: FERGON Take by mouth.   fluticasone 50 MCG/ACT nasal spray Commonly known as: FLONASE Place 1 spray into both nostrils daily.   folic acid 1 MG tablet Commonly known as: FOLVITE Take 1 mg by mouth  daily.   gabapentin 100 MG capsule Commonly known as: NEURONTIN Take 100 mg by mouth 2 (two) times daily.   nystatin powder Commonly known as: MYCOSTATIN/NYSTOP Apply topically 2 (two) times daily.   omeprazole 20 MG capsule Commonly known as: PRILOSEC Take 20 mg by mouth daily.   polyethylene glycol 17 g packet Commonly known as: MIRALAX / GLYCOLAX Take 17 g by mouth daily.   tamsulosin 0.4 MG Caps capsule Commonly known as: FLOMAX Take 0.4 mg by mouth at bedtime.   traMADol 50 MG tablet Commonly known as: Ultram Take 1 tablet (50 mg total) by mouth every 8 (eight) hours as needed for severe pain.               Durable Medical Equipment  (From admission, onward)           Start     Ordered   12/27/20 1543  For home use only DME standard manual wheelchair with seat cushion  Once       Comments: Patient suffers from back pain and obesity which impairs their ability to perform daily activities like long distance ambulation in the home.  A cane or crutch will not resolve issue with performing activities of daily living. A wheelchair will allow patient to safely perform daily activities. Patient can safely propel the wheelchair in the home or has a caregiver who can provide assistance. Length of need 12 months . Accessories: elevating leg  rests (ELRs), wheel locks, extensions and anti-tippers.   12/27/20 1544            Discharge Assessment: Vitals:   12/27/20 2143 12/28/20 0544  BP: (!) 144/47 (!) 166/57  Pulse: 89 90  Resp: 18 20  Temp: 98 F (36.7 C) 98.4 F (36.9 C)  SpO2: 98% 93%   Skin clean, dry and intact without evidence of skin break down, no evidence of skin tears noted. IV catheter discontinued intact. Site without signs and symptoms of complications - no redness or edema noted at insertion site, patient denies c/o pain - only slight tenderness at site.  Dressing with slight pressure applied.  D/c Instructions-Education: Discharge  instructions given to patient/family with verbalized understanding. D/c education completed with patient/family including follow up instructions, medication list, d/c activities limitations if indicated, with other d/c instructions as indicated by MD - patient able to verbalize understanding, all questions fully answered. Patient instructed to return to ED, call 911, or call MD for any changes in condition.  Patient escorted via [place], and D/C home via private auto.  Dorcas Mcmurray, LPN [id number] [phone] AM

## 2020-12-28 NOTE — Progress Notes (Signed)
Patient experienced difficulty securing motel room for discharge purposes as previously discussed on 12/27/2020.  Has remained hemodynamically stable, no overnight events.  Chart and discharge summary instructions from 12/27/2020 has been reviewed and no changes needed.  Patient remains stable for discharge once social barrier situation resolved.  Please refer to discharge summary from 12/27/2020 for further info/details.  Barton Dubois MD [phone]

## 2021-01-30 ENCOUNTER — Inpatient Hospital Stay (HOSPITAL_COMMUNITY): Payer: Medicare HMO

## 2021-01-30 ENCOUNTER — Inpatient Hospital Stay (HOSPITAL_COMMUNITY)
Admission: EM | Admit: 2021-01-30 | Discharge: 2021-03-10 | DRG: 871 | Disposition: E | Payer: Medicare HMO | Attending: Family Medicine | Admitting: Family Medicine

## 2021-01-30 ENCOUNTER — Encounter (HOSPITAL_COMMUNITY): Payer: Self-pay

## 2021-01-30 ENCOUNTER — Emergency Department (HOSPITAL_COMMUNITY): Payer: Medicare HMO

## 2021-01-30 ENCOUNTER — Other Ambulatory Visit: Payer: Self-pay

## 2021-01-30 DIAGNOSIS — N189 Chronic kidney disease, unspecified: Secondary | ICD-10-CM | POA: Diagnosis not present

## 2021-01-30 DIAGNOSIS — D696 Thrombocytopenia, unspecified: Secondary | ICD-10-CM | POA: Diagnosis present

## 2021-01-30 DIAGNOSIS — L03313 Cellulitis of chest wall: Secondary | ICD-10-CM | POA: Diagnosis present

## 2021-01-30 DIAGNOSIS — I5032 Chronic diastolic (congestive) heart failure: Secondary | ICD-10-CM | POA: Diagnosis present

## 2021-01-30 DIAGNOSIS — R161 Splenomegaly, not elsewhere classified: Secondary | ICD-10-CM | POA: Diagnosis not present

## 2021-01-30 DIAGNOSIS — Z515 Encounter for palliative care: Secondary | ICD-10-CM

## 2021-01-30 DIAGNOSIS — R296 Repeated falls: Secondary | ICD-10-CM | POA: Diagnosis present

## 2021-01-30 DIAGNOSIS — R7989 Other specified abnormal findings of blood chemistry: Secondary | ICD-10-CM

## 2021-01-30 DIAGNOSIS — T8453XA Infection and inflammatory reaction due to internal right knee prosthesis, initial encounter: Secondary | ICD-10-CM | POA: Diagnosis present

## 2021-01-30 DIAGNOSIS — Z23 Encounter for immunization: Secondary | ICD-10-CM | POA: Diagnosis not present

## 2021-01-30 DIAGNOSIS — J9621 Acute and chronic respiratory failure with hypoxia: Secondary | ICD-10-CM | POA: Diagnosis not present

## 2021-01-30 DIAGNOSIS — K766 Portal hypertension: Secondary | ICD-10-CM | POA: Diagnosis present

## 2021-01-30 DIAGNOSIS — Z96651 Presence of right artificial knee joint: Secondary | ICD-10-CM | POA: Diagnosis present

## 2021-01-30 DIAGNOSIS — F419 Anxiety disorder, unspecified: Secondary | ICD-10-CM | POA: Diagnosis present

## 2021-01-30 DIAGNOSIS — J9601 Acute respiratory failure with hypoxia: Secondary | ICD-10-CM | POA: Diagnosis present

## 2021-01-30 DIAGNOSIS — Z7189 Other specified counseling: Secondary | ICD-10-CM | POA: Diagnosis not present

## 2021-01-30 DIAGNOSIS — I509 Heart failure, unspecified: Secondary | ICD-10-CM | POA: Diagnosis not present

## 2021-01-30 DIAGNOSIS — Z66 Do not resuscitate: Secondary | ICD-10-CM | POA: Diagnosis present

## 2021-01-30 DIAGNOSIS — R7881 Bacteremia: Secondary | ICD-10-CM | POA: Clinically undetermined

## 2021-01-30 DIAGNOSIS — N179 Acute kidney failure, unspecified: Secondary | ICD-10-CM | POA: Diagnosis present

## 2021-01-30 DIAGNOSIS — J811 Chronic pulmonary edema: Secondary | ICD-10-CM

## 2021-01-30 DIAGNOSIS — G928 Other toxic encephalopathy: Secondary | ICD-10-CM | POA: Diagnosis present

## 2021-01-30 DIAGNOSIS — G4733 Obstructive sleep apnea (adult) (pediatric): Secondary | ICD-10-CM | POA: Diagnosis present

## 2021-01-30 DIAGNOSIS — E871 Hypo-osmolality and hyponatremia: Secondary | ICD-10-CM | POA: Diagnosis present

## 2021-01-30 DIAGNOSIS — I130 Hypertensive heart and chronic kidney disease with heart failure and stage 1 through stage 4 chronic kidney disease, or unspecified chronic kidney disease: Secondary | ICD-10-CM | POA: Diagnosis present

## 2021-01-30 DIAGNOSIS — I83028 Varicose veins of left lower extremity with ulcer other part of lower leg: Secondary | ICD-10-CM | POA: Diagnosis present

## 2021-01-30 DIAGNOSIS — Z792 Long term (current) use of antibiotics: Secondary | ICD-10-CM

## 2021-01-30 DIAGNOSIS — A4152 Sepsis due to Pseudomonas: Principal | ICD-10-CM | POA: Diagnosis present

## 2021-01-30 DIAGNOSIS — R509 Fever, unspecified: Secondary | ICD-10-CM

## 2021-01-30 DIAGNOSIS — B965 Pseudomonas (aeruginosa) (mallei) (pseudomallei) as the cause of diseases classified elsewhere: Secondary | ICD-10-CM | POA: Diagnosis not present

## 2021-01-30 DIAGNOSIS — J810 Acute pulmonary edema: Secondary | ICD-10-CM | POA: Diagnosis not present

## 2021-01-30 DIAGNOSIS — G8929 Other chronic pain: Secondary | ICD-10-CM | POA: Diagnosis present

## 2021-01-30 DIAGNOSIS — J9602 Acute respiratory failure with hypercapnia: Secondary | ICD-10-CM | POA: Diagnosis not present

## 2021-01-30 DIAGNOSIS — R4182 Altered mental status, unspecified: Secondary | ICD-10-CM | POA: Diagnosis not present

## 2021-01-30 DIAGNOSIS — E876 Hypokalemia: Secondary | ICD-10-CM | POA: Diagnosis not present

## 2021-01-30 DIAGNOSIS — N184 Chronic kidney disease, stage 4 (severe): Secondary | ICD-10-CM | POA: Diagnosis present

## 2021-01-30 DIAGNOSIS — J189 Pneumonia, unspecified organism: Secondary | ICD-10-CM | POA: Diagnosis present

## 2021-01-30 DIAGNOSIS — R188 Other ascites: Secondary | ICD-10-CM | POA: Diagnosis present

## 2021-01-30 DIAGNOSIS — E872 Acidosis, unspecified: Secondary | ICD-10-CM | POA: Diagnosis present

## 2021-01-30 DIAGNOSIS — R0602 Shortness of breath: Secondary | ICD-10-CM

## 2021-01-30 DIAGNOSIS — N492 Inflammatory disorders of scrotum: Secondary | ICD-10-CM | POA: Diagnosis not present

## 2021-01-30 DIAGNOSIS — R600 Localized edema: Secondary | ICD-10-CM

## 2021-01-30 DIAGNOSIS — I83018 Varicose veins of right lower extremity with ulcer other part of lower leg: Secondary | ICD-10-CM | POA: Diagnosis present

## 2021-01-30 DIAGNOSIS — N400 Enlarged prostate without lower urinary tract symptoms: Secondary | ICD-10-CM | POA: Diagnosis present

## 2021-01-30 DIAGNOSIS — R0600 Dyspnea, unspecified: Secondary | ICD-10-CM

## 2021-01-30 DIAGNOSIS — M1990 Unspecified osteoarthritis, unspecified site: Secondary | ICD-10-CM | POA: Diagnosis present

## 2021-01-30 DIAGNOSIS — Z8249 Family history of ischemic heart disease and other diseases of the circulatory system: Secondary | ICD-10-CM

## 2021-01-30 DIAGNOSIS — Z888 Allergy status to other drugs, medicaments and biological substances status: Secondary | ICD-10-CM

## 2021-01-30 DIAGNOSIS — Z882 Allergy status to sulfonamides status: Secondary | ICD-10-CM

## 2021-01-30 DIAGNOSIS — J302 Other seasonal allergic rhinitis: Secondary | ICD-10-CM | POA: Diagnosis present

## 2021-01-30 DIAGNOSIS — R578 Other shock: Secondary | ICD-10-CM | POA: Diagnosis not present

## 2021-01-30 DIAGNOSIS — Z811 Family history of alcohol abuse and dependence: Secondary | ICD-10-CM

## 2021-01-30 DIAGNOSIS — K7460 Unspecified cirrhosis of liver: Secondary | ICD-10-CM | POA: Diagnosis present

## 2021-01-30 DIAGNOSIS — M545 Low back pain, unspecified: Secondary | ICD-10-CM

## 2021-01-30 DIAGNOSIS — Z79899 Other long term (current) drug therapy: Secondary | ICD-10-CM

## 2021-01-30 DIAGNOSIS — D631 Anemia in chronic kidney disease: Secondary | ICD-10-CM | POA: Diagnosis present

## 2021-01-30 DIAGNOSIS — R5381 Other malaise: Secondary | ICD-10-CM | POA: Diagnosis present

## 2021-01-30 DIAGNOSIS — Z79891 Long term (current) use of opiate analgesic: Secondary | ICD-10-CM

## 2021-01-30 DIAGNOSIS — Y831 Surgical operation with implant of artificial internal device as the cause of abnormal reaction of the patient, or of later complication, without mention of misadventure at the time of the procedure: Secondary | ICD-10-CM | POA: Diagnosis present

## 2021-01-30 DIAGNOSIS — Z6841 Body Mass Index (BMI) 40.0 and over, adult: Secondary | ICD-10-CM

## 2021-01-30 DIAGNOSIS — Z20822 Contact with and (suspected) exposure to covid-19: Secondary | ICD-10-CM | POA: Diagnosis present

## 2021-01-30 DIAGNOSIS — N5089 Other specified disorders of the male genital organs: Secondary | ICD-10-CM | POA: Diagnosis present

## 2021-01-30 DIAGNOSIS — Z91040 Latex allergy status: Secondary | ICD-10-CM

## 2021-01-30 DIAGNOSIS — I872 Venous insufficiency (chronic) (peripheral): Secondary | ICD-10-CM | POA: Diagnosis present

## 2021-01-30 DIAGNOSIS — Z881 Allergy status to other antibiotic agents status: Secondary | ICD-10-CM

## 2021-01-30 DIAGNOSIS — Z9114 Patient's other noncompliance with medication regimen: Secondary | ICD-10-CM

## 2021-01-30 DIAGNOSIS — K219 Gastro-esophageal reflux disease without esophagitis: Secondary | ICD-10-CM | POA: Diagnosis present

## 2021-01-30 LAB — CBC WITH DIFFERENTIAL/PLATELET
Abs Immature Granulocytes: 0.16 10*3/uL — ABNORMAL HIGH (ref 0.00–0.07)
Basophils Absolute: 0 10*3/uL (ref 0.0–0.1)
Basophils Relative: 0 %
Eosinophils Absolute: 0 10*3/uL (ref 0.0–0.5)
Eosinophils Relative: 0 %
HCT: 28.8 % — ABNORMAL LOW (ref 39.0–52.0)
Hemoglobin: 8.9 g/dL — ABNORMAL LOW (ref 13.0–17.0)
Immature Granulocytes: 1 %
Lymphocytes Relative: 6 %
Lymphs Abs: 0.6 10*3/uL — ABNORMAL LOW (ref 0.7–4.0)
MCH: 28.8 pg (ref 26.0–34.0)
MCHC: 30.9 g/dL (ref 30.0–36.0)
MCV: 93.2 fL (ref 80.0–100.0)
Monocytes Absolute: 1.6 10*3/uL — ABNORMAL HIGH (ref 0.1–1.0)
Monocytes Relative: 14 %
Neutro Abs: 9 10*3/uL — ABNORMAL HIGH (ref 1.7–7.7)
Neutrophils Relative %: 79 %
Platelets: 114 10*3/uL — ABNORMAL LOW (ref 150–400)
RBC: 3.09 MIL/uL — ABNORMAL LOW (ref 4.22–5.81)
RDW: 16 % — ABNORMAL HIGH (ref 11.5–15.5)
WBC: 11.4 10*3/uL — ABNORMAL HIGH (ref 4.0–10.5)
nRBC: 0 % (ref 0.0–0.2)

## 2021-01-30 LAB — URINALYSIS, ROUTINE W REFLEX MICROSCOPIC
Bacteria, UA: NONE SEEN
Bilirubin Urine: NEGATIVE
Glucose, UA: >=500 mg/dL — AB
Hgb urine dipstick: NEGATIVE
Ketones, ur: NEGATIVE mg/dL
Leukocytes,Ua: NEGATIVE
Nitrite: NEGATIVE
Protein, ur: NEGATIVE mg/dL
Specific Gravity, Urine: 1.029 (ref 1.005–1.030)
pH: 7 (ref 5.0–8.0)

## 2021-01-30 LAB — BASIC METABOLIC PANEL
Anion gap: 6 (ref 5–15)
BUN: 22 mg/dL (ref 8–23)
CO2: 26 mmol/L (ref 22–32)
Calcium: 7.6 mg/dL — ABNORMAL LOW (ref 8.9–10.3)
Chloride: 104 mmol/L (ref 98–111)
Creatinine, Ser: 2.63 mg/dL — ABNORMAL HIGH (ref 0.61–1.24)
GFR, Estimated: 26 mL/min — ABNORMAL LOW (ref >60)
Glucose, Bld: 111 mg/dL — ABNORMAL HIGH (ref 70–99)
Potassium: 3.6 mmol/L (ref 3.5–5.1)
Sodium: 136 mmol/L (ref 135–145)

## 2021-01-30 LAB — VANCOMYCIN, RANDOM: Vancomycin Rm: 9

## 2021-01-30 LAB — RESP PANEL BY RT-PCR (FLU A&B, COVID) ARPGX2
Influenza A by PCR: NEGATIVE
Influenza B by PCR: NEGATIVE
SARS Coronavirus 2 by RT PCR: NEGATIVE

## 2021-01-30 LAB — BRAIN NATRIURETIC PEPTIDE: B Natriuretic Peptide: 722 pg/mL — ABNORMAL HIGH (ref 0.0–100.0)

## 2021-01-30 MED ORDER — SODIUM CHLORIDE 0.9 % IV SOLN
2.0000 g | Freq: Once | INTRAVENOUS | Status: AC
  Administered 2021-01-30: 2 g via INTRAVENOUS
  Filled 2021-01-30: qty 2

## 2021-01-30 MED ORDER — FUROSEMIDE 10 MG/ML IJ SOLN: 20.0000 mg | Freq: Two times a day (BID) | INTRAMUSCULAR | Status: DC

## 2021-01-30 MED ORDER — LINEZOLID 600 MG PO TABS
600.0000 mg | ORAL_TABLET | Freq: Two times a day (BID) | ORAL | Status: DC
  Administered 2021-01-31 – 2021-02-01 (×5): 600 mg via ORAL
  Filled 2021-01-30 (×7): qty 1

## 2021-01-30 MED ORDER — ACETAMINOPHEN 325 MG PO TABS: 650.0000 mg | ORAL_TABLET | Freq: Four times a day (QID) | ORAL | Status: DC | PRN

## 2021-01-30 MED ORDER — VANCOMYCIN HCL IN DEXTROSE 1-5 GM/200ML-% IV SOLN: 1000.0000 mg | Freq: Once | INTRAVENOUS | Status: DC

## 2021-01-30 MED ORDER — ACETAMINOPHEN 650 MG RE SUPP: 650.0000 mg | Freq: Four times a day (QID) | RECTAL | Status: DC | PRN

## 2021-01-30 MED ORDER — SODIUM CHLORIDE 0.9 % IV BOLUS
500.0000 mL | Freq: Once | INTRAVENOUS | Status: AC
  Administered 2021-01-30: 500 mL via INTRAVENOUS

## 2021-01-30 MED ORDER — ENOXAPARIN SODIUM 40 MG/0.4ML IJ SOSY
40.0000 mg | PREFILLED_SYRINGE | Freq: Every day | INTRAMUSCULAR | Status: DC
  Administered 2021-01-31 (×2): 40 mg via SUBCUTANEOUS
  Filled 2021-01-30 (×2): qty 0.4

## 2021-01-30 MED ORDER — VANCOMYCIN HCL 1500 MG/300ML IV SOLN: 1500.0000 mg | Freq: Once | INTRAVENOUS | Status: DC

## 2021-01-30 MED ORDER — SODIUM CHLORIDE 0.9 % IV SOLN: 2.0000 g | Freq: Once | INTRAVENOUS | Status: DC

## 2021-01-30 MED ORDER — SODIUM CHLORIDE 0.9 % IV SOLN
2.0000 g | Freq: Two times a day (BID) | INTRAVENOUS | Status: DC
Start: 2021-01-31 — End: 2021-02-02
  Administered 2021-01-31 – 2021-02-02 (×5): 2 g via INTRAVENOUS
  Filled 2021-01-30 (×5): qty 2

## 2021-01-30 MED ORDER — INFLUENZA VAC SPLIT QUAD 0.5 ML IM SUSY: 0.5000 mL | PREFILLED_SYRINGE | INTRAMUSCULAR | Status: DC

## 2021-01-30 MED ORDER — ACETAMINOPHEN 325 MG PO TABS
650.0000 mg | ORAL_TABLET | Freq: Four times a day (QID) | ORAL | Status: DC | PRN
  Administered 2021-01-31 – 2021-02-06 (×3): 650 mg via ORAL
  Filled 2021-01-30 (×4): qty 2

## 2021-01-30 MED ORDER — ACETAMINOPHEN 500 MG PO TABS
1000.0000 mg | ORAL_TABLET | Freq: Once | ORAL | Status: AC
  Administered 2021-01-30: 1000 mg via ORAL
  Filled 2021-01-30: qty 2

## 2021-01-30 NOTE — ED Triage Notes (Signed)
Pt to er room number 11 via ems, per ems pt is here for fever, scrotal pain and cellulitis, states that he is from [facility].

## 2021-01-30 NOTE — Progress Notes (Signed)
Vanc ordered per Rx for scrotal cellulitis. Due to his CKD, d/w Dr. Josephine Cables and we will change his vanc to linezolid for 7 days instead. His random vanc level today is 9 from SNF.   Onnie Boer, PharmD, BCIDP, AAHIVP, CPP Infectious Disease Pharmacist 01/15/2021 11:46 PM

## 2021-01-30 NOTE — ED Notes (Signed)
Pt is a pt at [facility] and has been getting IV vancomycin for cellulitis of his legs and scrotum; Katelyn, the nurse at [facility] states pt has been running a fever today with increased heart rate and had to be placed on O2 @ 4L for O2 sats in mid 70's; Katelyn reports pt is non-compliant with his medications

## 2021-01-30 NOTE — ED Triage Notes (Signed)
Pt 88% on room air, pt placed on 2L via [place].  Pt now satting in the 90s

## 2021-01-30 NOTE — ED Provider Notes (Signed)
[facility] EMERGENCY DEPARTMENT Provider Note   CSN: [id number] Arrival date & time: 02/06/2021  1350     History Chief Complaint  Patient presents with   Fever    Eddie Fletcher is a 65 y.o. male.  Patient with multiple medical problems including congestive heart failure and recent admission for UTI.  Patient sent over to the emergency department for fever  The history is provided by the patient and medical records. No language interpreter was used.  Fever Temp source:  Subjective Severity:  Moderate Onset quality:  Sudden Timing:  Constant Progression:  Worsening Chronicity:  New Worsened by:  Nothing Ineffective treatments:  None tried Associated symptoms: no chest pain, no congestion, no cough, no diarrhea, no headaches and no rash       Past Medical History:  Diagnosis Date   Arthritis    Cardiomegaly    Cellulitis    Chest discomfort    15 years ago; had negative testing and told it was related to stress   Colon polyps    Complication of anesthesia    blood pressure - low post op   Diaphoresis    Edema    Leg   GERD (gastroesophageal reflux disease)    Hemorrhoids    History of kidney stones    Hypertension    Obesity    Seasonal allergies    Sleep apnea    cpap 3 yrs-not used for 3-4 months   SOB (shortness of breath)    occ with exersion   Wears glasses     Patient Active Problem List   Diagnosis Date Noted   Acute metabolic encephalopathy 99991111   Acute kidney injury superimposed on CKD ([place]) 12/17/2020   Acute lower UTI 12/17/2020   Morbid obesity with BMI of 60.0-69.9, adult ([place]) 12/17/2020   Multiple falls 12/17/2020   Chronic low back pain with left-sided sciatica 12/12/2020   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   Sepsis due to Staphylococcus aureus ([place]) 01/09/2015   Elevated LFTs    Acute renal failure (HCC)    AKI (acute kidney injury) ([place]) 01/08/2015   Hypotension 01/08/2015   Septic joint of right knee joint  ([place]) 01/08/2015   Septic joint of left knee joint ([place]) 01/03/2015   Besnier-Boeck disease 02/05/2014   Apnea, sleep 02/05/2014   Avitaminosis D 02/05/2014   BP (high blood pressure) 02/05/2014   Colon polyp 02/05/2014   Osteoarthritis of right knee 02/05/2014   Total knee replacement status 01/30/2014    Past Surgical History:  Procedure Laterality Date   APPLICATION OF WOUND VAC Right 01/03/2015   Procedure: APPLICATION OF INCISIONAL  WOUND VAC;  Surgeon: Newt Minion, MD;  Location: [place];  Service: Orthopedics;  Laterality: Right;   CARPAL TUNNEL RELEASE Bilateral 04   COLONOSCOPY W/ BIOPSIES AND POLYPECTOMY     KNEE ARTHROSCOPY Right 12   TOTAL KNEE ARTHROPLASTY Right 01/30/2014   Procedure: Right Total Knee Arthroplasty;  Surgeon: Newt Minion, MD;  Location: [place];  Service: Orthopedics;  Laterality: Right;   TOTAL KNEE REVISION Right 01/03/2015   Procedure: Revision Right Total Knee Arthroplasty, Poly Exchange, Place Antibiotic Beads;  Surgeon: Newt Minion, MD;  Location: [place];  Service: Orthopedics;  Laterality: Right;   TRIGGER FINGER RELEASE Right 12   rt thumb       Family History  Problem Relation Age of Onset   Peripheral vascular disease Father    Alcoholism Mother    Hypertension  Maternal Grandfather    Leukemia Maternal Aunt    Breast cancer Maternal Aunt     Social History   Tobacco Use   Smoking status: Never   Smokeless tobacco: Never  Vaping Use   Vaping Use: Never used  Substance Use Topics   Alcohol use: No    Comment: quit 15 yrs   Drug use: No    Home Medications Prior to Admission medications   Medication Sig Start Date End Date Taking? Authorizing Provider  acetaminophen (TYLENOL) 500 MG tablet Take 1,000 mg by mouth every 6 (six) hours as needed for moderate pain.    [provider]  albuterol (PROVENTIL HFA;VENTOLIN HFA) 108 (90 BASE) MCG/ACT inhaler Inhale 1-2 puffs into the lungs every 6 (six) hours as needed for  wheezing or shortness of breath.    [provider]  cholecalciferol (VITAMIN D) 1000 UNITS tablet Take 1,000 Units by mouth daily.    [provider]  Cholecalciferol 50 MCG (2000 UT) TABS Take by mouth. Patient not taking: No sig reported 01/25/18   [provider]  clotrimazole (LOTRIMIN) 1 % cream Apply 1 application topically 2 (two) times daily. 02/27/20 02/26/21  [provider]  diphenhydrAMINE (BENADRYL) 25 mg capsule Take 1 capsule (25 mg total) by mouth every 6 (six) hours as needed for itching or allergies. 12/27/20   Barton Dubois, MD  ferrous gluconate (FERGON) 324 MG tablet Take by mouth. 11/12/19   [provider]  fluticasone (FLONASE) 50 MCG/ACT nasal spray Place 1 spray into both nostrils daily.    [provider]  folic acid (FOLVITE) 1 MG tablet Take 1 mg by mouth daily. 11/21/17   [provider]  gabapentin (NEURONTIN) 100 MG capsule Take 100 mg by mouth 2 (two) times daily. 12/06/20   [provider]  nystatin (MYCOSTATIN/NYSTOP) powder Apply topically 2 (two) times daily. 12/27/20   Barton Dubois, MD  omeprazole (PRILOSEC) 20 MG capsule Take 20 mg by mouth daily. Patient not taking: No sig reported    [provider]  polyethylene glycol (MIRALAX / GLYCOLAX) 17 g packet Take 17 g by mouth daily. 12/27/20   Barton Dubois, MD  tamsulosin (FLOMAX) 0.4 MG CAPS capsule Take 0.4 mg by mouth at bedtime.    [provider]  traMADol (ULTRAM) 50 MG tablet Take 1 tablet (50 mg total) by mouth every 8 (eight) hours as needed for severe pain. 12/27/20 12/27/21  Barton Dubois, MD    Allergies    Latex, Levaquin [levofloxacin in d5w], Keflex [cephalexin], Lisinopril, and Sulfa antibiotics  Review of Systems   Review of Systems  Constitutional:  Positive for fever. Negative for appetite change and fatigue.  HENT:  Negative for congestion, ear discharge and sinus pressure.   Eyes:  Negative for  discharge.  Respiratory:  Negative for cough.   Cardiovascular:  Negative for chest pain.  Gastrointestinal:  Negative for abdominal pain and diarrhea.  Genitourinary:  Negative for frequency and hematuria.  Musculoskeletal:  Negative for back pain.  Skin:  Negative for rash.  Neurological:  Negative for seizures and headaches.  Psychiatric/Behavioral:  Negative for hallucinations.    Physical Exam Updated Vital Signs BP (!) 111/51   Pulse 94   Temp 99.5 F (37.5 C) (Oral)   Resp 20   Ht '5\' 6"'$  (1.676 m)   Wt (!) 172.4 kg   SpO2 95%   BMI 61.33 kg/m   Physical Exam Vitals and nursing note reviewed.  Constitutional:  Appearance: He is well-developed.  HENT:     Head: Normocephalic.     Nose: Nose normal.  Eyes:     General: No scleral icterus.    Conjunctiva/sclera: Conjunctivae normal.  Neck:     Thyroid: No thyromegaly.  Cardiovascular:     Rate and Rhythm: Normal rate and regular rhythm.     Heart sounds: No murmur heard.   No friction rub. No gallop.  Pulmonary:     Breath sounds: No stridor. No wheezing or rales.  Chest:     Chest wall: No tenderness.  Abdominal:     General: There is no distension.     Tenderness: There is no abdominal tenderness. There is no rebound.  Musculoskeletal:        General: Normal range of motion.     Cervical back: Neck supple.  Lymphadenopathy:     Cervical: No cervical adenopathy.  Skin:    Findings: No erythema or rash.  Neurological:     Mental Status: He is alert and oriented to person, place, and time.     Motor: No abnormal muscle tone.     Coordination: Coordination normal.  Psychiatric:        Behavior: Behavior normal.    ED Results / Procedures / Treatments   Labs (all labs ordered are listed, but only abnormal results are displayed) Labs Reviewed  CBC WITH DIFFERENTIAL/PLATELET - Abnormal; Notable for the following components:      Result Value   WBC 11.4 (*)    RBC 3.09 (*)    Hemoglobin 8.9 (*)     HCT 28.8 (*)    RDW 16.0 (*)    Platelets 114 (*)    Neutro Abs 9.0 (*)    Lymphs Abs 0.6 (*)    Monocytes Absolute 1.6 (*)    Abs Immature Granulocytes 0.16 (*)    All other components within normal limits  BASIC METABOLIC PANEL - Abnormal; Notable for the following components:   Glucose, Bld 111 (*)    Creatinine, Ser 2.63 (*)    Calcium 7.6 (*)    GFR, Estimated 26 (*)    All other components within normal limits  URINALYSIS, ROUTINE W REFLEX MICROSCOPIC - Abnormal; Notable for the following components:   Glucose, UA >=500 (*)    All other components within normal limits  BRAIN NATRIURETIC PEPTIDE - Abnormal; Notable for the following components:   B Natriuretic Peptide 722.0 (*)    All other components within normal limits  CULTURE, BLOOD (SINGLE)  RESP PANEL BY RT-PCR (FLU A&B, COVID) [id number]  URINE CULTURE    EKG None  Radiology CT Chest Wo Contrast  Result Date: 01/29/2021 CLINICAL DATA:  Shortness of breath. EXAM: CT CHEST WITHOUT CONTRAST TECHNIQUE: Multidetector CT imaging of the chest was performed following the standard protocol without IV contrast. COMPARISON:  None. FINDINGS: Cardiovascular: A left-sided venous catheter is in place. No significant vascular findings. Normal heart size. No pericardial effusion. Mediastinum/Nodes: No enlarged mediastinal or axillary lymph nodes. Thyroid gland, trachea, and esophagus demonstrate no significant findings. Lungs/Pleura: Mild linear scarring and/or atelectasis is seen within the posteromedial aspect of the left upper lobe and lateral aspect of the left lung base. There is no evidence of acute infiltrate, pleural effusion or pneumothorax. Upper Abdomen: Subcentimeter gallstones are seen within the lumen of a moderately distended gallbladder. There is moderate to marked severity splenomegaly. A mild amount of ascites is present. Musculoskeletal: Marked severity subcutaneous and para muscular inflammatory fat stranding  is seen along  the lateral aspect of the mid to lower left chest wall. This extends along the visualized portion of the lateral left abdominal wall. Degenerative changes are seen throughout the thoracic spine. Other: It should be noted that the study is limited secondary to patient motion. IMPRESSION: 1. Marked severity cellulitis along the lateral aspect of the mid and lower left chest wall. 2. Moderate to marked severity splenomegaly. 3. Cholelithiasis. 4. Mild amount of ascites. Electronically Signed   By: Virgina Norfolk M.D.   On: 01/18/2021 19:24   DG Chest Port 1 View  Result Date: 01/31/2021 CLINICAL DATA:  Shortness of breath EXAM: PORTABLE CHEST 1 VIEW COMPARISON:  12/17/2020 FINDINGS: Cardiomegaly. Pulmonary vascular congestion. Diffuse interstitial prominence. Left lung base is largely obscured. A left-sided pleural effusion is not excluded. No pneumothorax. A left-sided PICC line terminates at the level of the SVC. IMPRESSION: Cardiomegaly with findings suggestive of CHF with pulmonary edema. A superimposed infectious process would be difficult to exclude. Electronically Signed   By: Davina Poke D.O.   On: 01/23/2021 17:29    Procedures Procedures   Medications Ordered in ED Medications  acetaminophen (TYLENOL) tablet 1,000 mg (1,000 mg Oral Given 02/04/2021 1727)  sodium chloride 0.9 % bolus 500 mL (0 mLs Intravenous Stopped 01/24/2021 1952)  aztreonam (AZACTAM) 2 g in sodium chloride 0.9 % 100 mL IVPB (2 g Intravenous New Bag/Given 01/20/2021 1951)    ED Course  I have reviewed the triage vital signs and the nursing notes.  Pertinent labs & imaging results that were available during my care of the patient were reviewed by me and considered in my medical decision making (see chart for details).    MDM Rules/Calculators/A&P                           Patient with hypoxia possibly related to congestive heart failure.  Patient with fever and CT of the chest suggesting cellulitis.  Hospitalist will  see the patient and most likely admit Final Clinical Impression(s) / ED Diagnoses Final diagnoses:  Febrile illness    Rx / DC Orders ED Discharge Orders     None        Milton Ferguson, MD 02/02/21 1142

## 2021-01-30 NOTE — ED Notes (Signed)
Blood and blood cultures drawn during iv start, pt has picc line in L arm, attempted second iv placement unsuccessful times two.

## 2021-01-30 NOTE — Progress Notes (Addendum)
Pharmacy Antibiotic Note  Eddie Fletcher is a 65 y.o. male from [facility] admitted on 01/31/2021 with fever, scrotal pain and cellulis.  Per Forestine Na RN, pt has been receiving IV vancomycin there;  however, pt has refused some of the doses (last dose time unknown). Pharmacy has been consulted for vancomycin and cefepime dosing for cellulitis, with 7 day duration of therapy specified in consult.  WBC 11.4, Tmax 101 F; Scr 2.63, CrCl 43 ml/min (renal function appears to be at baseline, per Epic values from last month)  Pt rec'd aztreonam 2 gm IV X 1 at 1951 this evening  Plan: Obtain stat vancomycin level; pharmacist will evaluate vancomycin level and determine vancomycin dosing regimen Cefepime 2 gm IV Q 12 hrs Monitor WBC, temp, clinical course, cultures, renal function, vancomycin levels as indicated  Height: '5\' 6"'$  (167.6 cm) Weight: (!) 172.4 kg (380 lb) IBW/kg (Calculated) : 63.8  Temp (24hrs), Avg:99.3 F (37.4 C), Min:98.3 F (36.8 C), Max:101 F (38.3 C)  Recent Labs  Lab 01/23/2021 1715  WBC 11.4*  CREATININE 2.63*    Estimated Creatinine Clearance: 43 mL/min (A) (by C-G formula based on SCr of 2.63 mg/dL (H)).    Allergies  Allergen Reactions   Latex Other (See Comments)    Latex cath caused burning and irritation Latex cath caused burning and irritation    Levaquin [Levofloxacin In D5w] Other (See Comments)    Muscle aches   Keflex [Cephalexin] Rash   Lisinopril Cough   Sulfa Antibiotics Rash    Antimicrobials this admission: Aztreonam X 1 9/23 Cefepime 9/23 >> Vancomycin 9/23 >>  Microbiology results: 9/23 COVID, flu A, flu B: negative 9/23 Bld cx X 2: pending  Thank you for allowing pharmacy to be a part of this patient's care.  Gillermina Hu, PharmD, BCPS, [facility] Clinical Pharmacist 01/25/2021 9:57 PM

## 2021-01-30 NOTE — H&P (Signed)
History and Physical  Eddie Fletcher [id number] DOB: 17-Jul-1955 DOA: 01/11/2021  Referring physician: Milton Ferguson, MD PCP: Curly Rim, MD  Patient coming from: [facility]  Chief Complaint: Fever  HPI: Eddie Fletcher is a 65 y.o. male with medical history significant for hypertension, chronic back pain, multiple falls, sleep apnea, morbid obesity, BPH who presents to the emergency department from [facility] via EMS due to 1 day onset of fever.  Patient was somnolent, though he was easily arousable, but quickly goes back to sleep and was unable to provide much history, history was obtained from ED physician and ED medical record.  Patient was being treated with IV vancomycin due to cellulitis of scrotum and legs, was noted to have fever today and was noted to be hypoxic with O2 sat in the 70s and required supplemental oxygen via [place] at 4 LPM, so EMS was activated and patient was sent to the ED for further evaluation.  Patient was reported to be noncompliant with his medications. Patient was admitted from 8/10-8/21 due to acute metabolic encephalopathy presumed to be due to UTI as well as narcotic medications in the setting of acute kidney injury on CKD.  IV antibiotics were provided for the treatment of UTI prior to discharge.  ED Course:  In the emergency department, was tachycardic and febrile with a temperature of 101F, BP was 118/39 and O2 sat was 97% on supplemental oxygen via [place] at 2 LPM.  Work-up in the ED showed mild leukocytosis, normocytic anemia, BMP 7/22 (this was 1204 about 1 month ago, BUN to creatinine 22/2.63 (baseline creatinine at 1.8-2.5).  Influenza A, B, SARS coronavirus 2 was negative. CT of chest without contrast showed 1. Marked severity cellulitis along the lateral aspect of the mid and lower left chest wall. 2. Moderate to marked severity splenomegaly Chest x-ray showed cardiomegaly with findings suggestive of CHF with pulmonary edema. A superimposed infectious  process would be difficult to exclude. IV Azactam was given, Tylenol was given due to fever and IV hydration of 500 mL was provided.  Hospitalist was asked to admit patient for further evaluation and management.  Review of Systems: Constitutional: Positive for fever and negative for chills  HENT: Negative for ear pain and sore throat.   Eyes: Negative for pain and visual disturbance.  Respiratory: Negative for cough, chest tightness and shortness of breath.   Cardiovascular: Negative for chest pain and palpitations.  Gastrointestinal: Negative for abdominal pain and vomiting.  Endocrine: Negative for polyphagia and polyuria.  Genitourinary: Negative for decreased urine volume, dysuria, enuresis Musculoskeletal: Negative for arthralgias and back pain.  Skin: Negative for color change and rash.  Allergic/Immunologic: Negative for immunocompromised state.  Neurological: Negative for tremors, syncope, speech difficulty Hematological: Does not bruise/bleed easily.  All other systems reviewed and are negative   Past Medical History:  Diagnosis Date   Arthritis    Cardiomegaly    Cellulitis    Chest discomfort    15 years ago; had negative testing and told it was related to stress   Colon polyps    Complication of anesthesia    blood pressure - low post op   Diaphoresis    Edema    Leg   GERD (gastroesophageal reflux disease)    Hemorrhoids    History of kidney stones    Hypertension    Obesity    Seasonal allergies    Sleep apnea    cpap 3 yrs-not used for 3-4 months   SOB (  shortness of breath)    occ with exersion   Wears glasses    Past Surgical History:  Procedure Laterality Date   APPLICATION OF WOUND VAC Right 01/03/2015   Procedure: APPLICATION OF INCISIONAL  WOUND VAC;  Surgeon: Newt Minion, MD;  Location: [facility];  Service: Orthopedics;  Laterality: Right;   CARPAL TUNNEL RELEASE Bilateral 04   COLONOSCOPY W/ BIOPSIES AND POLYPECTOMY     KNEE ARTHROSCOPY Right 12    TOTAL KNEE ARTHROPLASTY Right 01/30/2014   Procedure: Right Total Knee Arthroplasty;  Surgeon: Newt Minion, MD;  Location: [facility];  Service: Orthopedics;  Laterality: Right;   TOTAL KNEE REVISION Right 01/03/2015   Procedure: Revision Right Total Knee Arthroplasty, Poly Exchange, Place Antibiotic Beads;  Surgeon: Newt Minion, MD;  Location: [facility];  Service: Orthopedics;  Laterality: Right;   TRIGGER FINGER RELEASE Right 12   rt thumb    Social History:  reports that he has never smoked. He has never used smokeless tobacco. He reports that he does not drink alcohol and does not use drugs.   Allergies  Allergen Reactions   Latex Other (See Comments)    Latex cath caused burning and irritation Latex cath caused burning and irritation    Levaquin [Levofloxacin In D5w] Other (See Comments)    Muscle aches   Keflex [Cephalexin] Rash   Lisinopril Cough   Sulfa Antibiotics Rash    Family History  Problem Relation Age of Onset   Peripheral vascular disease Father    Alcoholism Mother    Hypertension Maternal Grandfather    Leukemia Maternal Aunt    Breast cancer Maternal Aunt      Prior to Admission medications   Medication Sig Start Date End Date Taking? Authorizing Provider  acetaminophen (TYLENOL) 500 MG tablet Take 1,000 mg by mouth every 6 (six) hours as needed for moderate pain.    [provider]  albuterol (PROVENTIL HFA;VENTOLIN HFA) 108 (90 BASE) MCG/ACT inhaler Inhale 1-2 puffs into the lungs every 6 (six) hours as needed for wheezing or shortness of breath.    [provider]  cholecalciferol (VITAMIN D) 1000 UNITS tablet Take 1,000 Units by mouth daily.    [provider]  Cholecalciferol 50 MCG (2000 UT) TABS Take by mouth. Patient not taking: No sig reported 01/25/18   [provider]  clotrimazole (LOTRIMIN) 1 % cream Apply 1 application topically 2 (two) times daily. 02/27/20 02/26/21  [provider]  diphenhydrAMINE  (BENADRYL) 25 mg capsule Take 1 capsule (25 mg total) by mouth every 6 (six) hours as needed for itching or allergies. 12/27/20   Barton Dubois, MD  ferrous gluconate (FERGON) 324 MG tablet Take by mouth. 11/12/19   [provider]  fluticasone (FLONASE) 50 MCG/ACT nasal spray Place 1 spray into both nostrils daily.    [provider]  folic acid (FOLVITE) 1 MG tablet Take 1 mg by mouth daily. 11/21/17   [provider]  gabapentin (NEURONTIN) 100 MG capsule Take 100 mg by mouth 2 (two) times daily. 12/06/20   [provider]  nystatin (MYCOSTATIN/NYSTOP) powder Apply topically 2 (two) times daily. 12/27/20   Barton Dubois, MD  omeprazole (PRILOSEC) 20 MG capsule Take 20 mg by mouth daily. Patient not taking: No sig reported    [provider]  polyethylene glycol (MIRALAX / GLYCOLAX) 17 g packet Take 17 g by mouth daily. 12/27/20   Barton Dubois, MD  tamsulosin (FLOMAX) 0.4 MG CAPS capsule  Take 0.4 mg by mouth at bedtime.    [provider]  traMADol (ULTRAM) 50 MG tablet Take 1 tablet (50 mg total) by mouth every 8 (eight) hours as needed for severe pain. 12/27/20 12/27/21  Barton Dubois, MD    Physical Exam: BP (!) 130/56 (BP Location: Right Arm)   Pulse 99   Temp 99.3 F (37.4 C) (Oral)   Resp (!) 22   Ht '5\' 6"'$  (1.676 m)   Wt (!) 175.3 kg   SpO2 96%   BMI 62.38 kg/m   General: 65 y.o. year-old morbidly obese male, ill appearing but in no acute distress.  Somnolent but easily arousable  HEENT: NCAT, EOMI Neck: Supple, trachea medial Cardiovascular: Tachycardia. Regular rate and rhythm with no rubs or gallops.  No thyromegaly or JVD noted.   2/4 pulses in all 4 extremities. Respiratory: Bilateral Rales in lower lobes on auscultation.  No wheezes  Abdomen: Soft, obese, erythema, warmth to touch in left lateral abdominal wall. Normal bowel sounds x4 quadrants. Muskuloskeletal: Erythema and warmth to touch in left lateral thoracic wall.   No cyanosis or clubbing  noted bilaterally Neuro: CN II-XII intact, sensation, reflexes intact.  No focal neurologic deficit Skin: No ulcerative lesions noted or rashes Psychiatry: Mood is appropriate for condition and setting          Labs on Admission:  Basic Metabolic Panel: Recent Labs  Lab 02/04/2021 1715  NA 136  K 3.6  CL 104  CO2 26  GLUCOSE 111*  BUN 22  CREATININE 2.63*  CALCIUM 7.6*   Liver Function Tests: No results for input(s): AST, ALT, ALKPHOS, BILITOT, PROT, ALBUMIN in the last 168 hours. No results for input(s): LIPASE, AMYLASE in the last 168 hours. No results for input(s): AMMONIA in the last 168 hours. CBC: Recent Labs  Lab 01/24/2021 1715  WBC 11.4*  NEUTROABS 9.0*  HGB 8.9*  HCT 28.8*  MCV 93.2  PLT 114*   Cardiac Enzymes: No results for input(s): CKTOTAL, CKMB, CKMBINDEX, TROPONINI in the last 168 hours.  BNP (last 3 results) Recent Labs    12/17/20 1532 01/27/2021 1715  BNP 1,204.0* 722.0*    ProBNP (last 3 results) No results for input(s): PROBNP in the last 8760 hours.  CBG: No results for input(s): GLUCAP in the last 168 hours.  Radiological Exams on Admission: CT Chest Wo Contrast  Result Date: 01/23/2021 CLINICAL DATA:  Shortness of breath. EXAM: CT CHEST WITHOUT CONTRAST TECHNIQUE: Multidetector CT imaging of the chest was performed following the standard protocol without IV contrast. COMPARISON:  None. FINDINGS: Cardiovascular: A left-sided venous catheter is in place. No significant vascular findings. Normal heart size. No pericardial effusion. Mediastinum/Nodes: No enlarged mediastinal or axillary lymph nodes. Thyroid gland, trachea, and esophagus demonstrate no significant findings. Lungs/Pleura: Mild linear scarring and/or atelectasis is seen within the posteromedial aspect of the left upper lobe and lateral aspect of the left lung base. There is no evidence of acute infiltrate, pleural effusion or pneumothorax. Upper Abdomen:  Subcentimeter gallstones are seen within the lumen of a moderately distended gallbladder. There is moderate to marked severity splenomegaly. A mild amount of ascites is present. Musculoskeletal: Marked severity subcutaneous and para muscular inflammatory fat stranding is seen along the lateral aspect of the mid to lower left chest wall. This extends along the visualized portion of the lateral left abdominal wall. Degenerative changes are seen throughout the thoracic spine. Other: It should be noted that the study is limited secondary to patient  motion. IMPRESSION: 1. Marked severity cellulitis along the lateral aspect of the mid and lower left chest wall. 2. Moderate to marked severity splenomegaly. 3. Cholelithiasis. 4. Mild amount of ascites. Electronically Signed   By: Virgina Norfolk M.D.   On: 02/06/2021 19:24   DG Chest Port 1 View  Result Date: 02/01/2021 CLINICAL DATA:  Shortness of breath EXAM: PORTABLE CHEST 1 VIEW COMPARISON:  12/17/2020 FINDINGS: Cardiomegaly. Pulmonary vascular congestion. Diffuse interstitial prominence. Left lung base is largely obscured. A left-sided pleural effusion is not excluded. No pneumothorax. A left-sided PICC line terminates at the level of the SVC. IMPRESSION: Cardiomegaly with findings suggestive of CHF with pulmonary edema. A superimposed infectious process would be difficult to exclude. Electronically Signed   By: Davina Poke D.O.   On: 01/11/2021 17:29    EKG: I independently viewed the EKG done and my findings are as followed: Normal sinus rhythm at rate of 91 bpm with RBBB  Assessment/Plan Present on Admission:  Cellulitis of chest wall  Principal Problem:   Cellulitis of chest wall Active Problems:   Obstructive sleep apnea   Chronic low back pain   Morbid obesity with BMI of 60.0-69.9, adult (HCC)   Multiple falls   Elevated brain natriuretic peptide (BNP) level   Pulmonary edema   Splenomegaly, not elsewhere classified   Ascites    Chronic venous stasis dermatitis of both lower extremities   Chronic kidney disease   Acute febrile illness   Acute respiratory failure with hypoxia (HCC)   Cellulitis of scrotum  Acute febrile illness possibly secondary to left mid and lower chest wall cellulitis Cellulitis of the scrotum CT of chest without contrast showed marked severity cellulitis along the lateral aspect of the mid and lower left chest wall. Patient has a PICC line, he was reported to be on IV vancomycin due to cellulitis of the scrotum and legs We shall continue with linezolid and cefepime Blood culture pending  Acute respiratory failure with hypoxia possibly secondary to pulmonary edema, rule out CHF Elevated BNP Chest x-ray showed cardiomegaly with findings suggestive of CHF with pulmonary edema BNP 722 (this was 1204 about 1 month ago) Continue total input/output, daily weights and fluid restriction Continue IV Lasix 20 twice daily (if BP permits) Continue Cardiac diet  Echocardiogram done on 01/09/2015 showed LVEF of 60 to 70% with no RWMA and grade 1 diastolic dysfunction.  Echocardiogram will be done in the morning  Continue supplemental oxygen via [place] with plan to wean patient off supplemental oxygen as tolerated  Questionable superimposed lung infection in the setting of above Chest x-ray suggested a superimposed infection in the setting of above Patient was already started on IV vancomycin and cefepime Procalcitonin will be checked Blood culture pending  Splenomegaly and ascites CT of chest showed moderate to marked severity splenomegaly and mild amount of ascites The cause of splenomegaly is currently unknown, differential diagnosis wide including cirrhosis, CHF, infection (bacterial, viral, fungal, parasitic) and malignancy CT abdomen and pelvis will be done  Chronic kidney disease stage IV BUN to creatinine 22/2.63 (baseline creatinine at 1.8-2.5) Renally adjust medications, avoid nephrotoxic  agents/dehydration/hypotension  Chronic venous stasis dermatitis Stable  Obstructive sleep apnea Patient does not wear CPAP at home, and refuses here in the hopsital Importance of compliance provided.   Chronic low back pain Continue Tylenol as needed Continue fall precaution and neurochecks  Multiple falls Continue fall precaution and neurochecks Continue PT/OT eval and treat  Morbid obesity (BMI 61.33) Patient will be  counseled on diet and lifestyle modification when stable Patient will need to follow-up with outpatient PCP for weight loss program  DVT prophylaxis: Lovenox  Code Status: Full code  Family Communication: None at bedside  Disposition Plan:  Patient is from:                        home Anticipated DC to:                   SNF or family members home Anticipated DC date:               2-3 days Anticipated DC barriers:          Patient requires inpatient management for cellulitis and acute respiratory failure with hypoxia due to possible new onset CHF     Consults called: None  Admission status: Inpatient    Bernadette Hoit MD [facility]  02/06/2021, 11:37 PM

## 2021-01-31 ENCOUNTER — Inpatient Hospital Stay (HOSPITAL_COMMUNITY): Payer: Medicare HMO

## 2021-01-31 DIAGNOSIS — J9602 Acute respiratory failure with hypercapnia: Secondary | ICD-10-CM

## 2021-01-31 DIAGNOSIS — I509 Heart failure, unspecified: Secondary | ICD-10-CM | POA: Diagnosis not present

## 2021-01-31 DIAGNOSIS — J9621 Acute and chronic respiratory failure with hypoxia: Secondary | ICD-10-CM

## 2021-01-31 DIAGNOSIS — L03313 Cellulitis of chest wall: Secondary | ICD-10-CM | POA: Diagnosis not present

## 2021-01-31 LAB — COMPREHENSIVE METABOLIC PANEL
ALT: 21 U/L (ref 0–44)
AST: 33 U/L (ref 15–41)
Albumin: 1.8 g/dL — ABNORMAL LOW (ref 3.5–5.0)
Alkaline Phosphatase: 91 U/L (ref 38–126)
Anion gap: 7 (ref 5–15)
BUN: 24 mg/dL — ABNORMAL HIGH (ref 8–23)
CO2: 25 mmol/L (ref 22–32)
Calcium: 7.5 mg/dL — ABNORMAL LOW (ref 8.9–10.3)
Chloride: 103 mmol/L (ref 98–111)
Creatinine, Ser: 2.73 mg/dL — ABNORMAL HIGH (ref 0.61–1.24)
GFR, Estimated: 25 mL/min — ABNORMAL LOW (ref >60)
Glucose, Bld: 100 mg/dL — ABNORMAL HIGH (ref 70–99)
Potassium: 3.9 mmol/L (ref 3.5–5.1)
Sodium: 135 mmol/L (ref 135–145)
Total Bilirubin: 1.2 mg/dL (ref 0.3–1.2)
Total Protein: 6.1 g/dL — ABNORMAL LOW (ref 6.5–8.1)

## 2021-01-31 LAB — URINALYSIS, ROUTINE W REFLEX MICROSCOPIC
Bilirubin Urine: NEGATIVE
Glucose, UA: NEGATIVE mg/dL
Ketones, ur: NEGATIVE mg/dL
Leukocytes,Ua: NEGATIVE
Nitrite: NEGATIVE
Protein, ur: 30 mg/dL — AB
Specific Gravity, Urine: 1.014 (ref 1.005–1.030)
pH: 5 (ref 5.0–8.0)

## 2021-01-31 LAB — MRSA NEXT GEN BY PCR, NASAL: MRSA by PCR Next Gen: NOT DETECTED

## 2021-01-31 LAB — ECHOCARDIOGRAM COMPLETE
AR max vel: 2.72 cm2
AV Area VTI: 3.97 cm2
AV Area mean vel: 3.08 cm2
AV Mean grad: 7 mmHg
AV Peak grad: 14.7 mmHg
Ao pk vel: 1.92 m/s
Area-P 1/2: 4.52 cm2
Height: 66 in
S' Lateral: 2.7 cm
Weight: 6215.21 oz

## 2021-01-31 LAB — BLOOD GAS, ARTERIAL
Acid-base deficit: 1.5 mmol/L (ref 0.0–2.0)
Bicarbonate: 22.5 mmol/L (ref 20.0–28.0)
FIO2: 52
O2 Saturation: 98.4 %
Patient temperature: 38.9
pCO2 arterial: 65.1 mmHg (ref 32.0–48.0)
pH, Arterial: 7.22 — ABNORMAL LOW (ref 7.350–7.450)
pO2, Arterial: 137 mmHg — ABNORMAL HIGH (ref 83.0–108.0)

## 2021-01-31 LAB — CBC
HCT: 26 % — ABNORMAL LOW (ref 39.0–52.0)
Hemoglobin: 8.2 g/dL — ABNORMAL LOW (ref 13.0–17.0)
MCH: 29.4 pg (ref 26.0–34.0)
MCHC: 31.5 g/dL (ref 30.0–36.0)
MCV: 93.2 fL (ref 80.0–100.0)
Platelets: 97 10*3/uL — ABNORMAL LOW (ref 150–400)
RBC: 2.79 MIL/uL — ABNORMAL LOW (ref 4.22–5.81)
RDW: 16.2 % — ABNORMAL HIGH (ref 11.5–15.5)
WBC: 9.6 10*3/uL (ref 4.0–10.5)
nRBC: 0 % (ref 0.0–0.2)

## 2021-01-31 LAB — APTT: aPTT: 38 seconds — ABNORMAL HIGH (ref 24–36)

## 2021-01-31 LAB — PHOSPHORUS: Phosphorus: 3.8 mg/dL (ref 2.5–4.6)

## 2021-01-31 LAB — PROTIME-INR
INR: 1.7 — ABNORMAL HIGH (ref 0.8–1.2)
Prothrombin Time: 19.8 seconds — ABNORMAL HIGH (ref 11.4–15.2)

## 2021-01-31 LAB — GLUCOSE, CAPILLARY: Glucose-Capillary: 89 mg/dL (ref 70–99)

## 2021-01-31 LAB — LACTIC ACID, PLASMA: Lactic Acid, Venous: 1.7 mmol/L (ref 0.5–1.9)

## 2021-01-31 LAB — MAGNESIUM: Magnesium: 1.7 mg/dL (ref 1.7–2.4)

## 2021-01-31 LAB — PROCALCITONIN: Procalcitonin: 33.06 ng/mL

## 2021-01-31 MED ORDER — INFLUENZA VAC SPLIT QUAD 0.5 ML IM SUSY: 0.5000 mL | PREFILLED_SYRINGE | INTRAMUSCULAR | Status: DC | PRN

## 2021-01-31 MED ORDER — GABAPENTIN 100 MG PO CAPS
100.0000 mg | ORAL_CAPSULE | Freq: Two times a day (BID) | ORAL | Status: DC
  Administered 2021-02-01 – 2021-02-14 (×28): 100 mg via ORAL
  Filled 2021-01-31 (×28): qty 1

## 2021-01-31 MED ORDER — HYDROCODONE-ACETAMINOPHEN 5-325 MG PO TABS
1.0000 | ORAL_TABLET | ORAL | Status: DC | PRN
Start: 2021-01-31 — End: 2021-02-19
  Administered 2021-01-31 – 2021-02-14 (×10): 1 via ORAL
  Filled 2021-01-31 (×11): qty 1

## 2021-01-31 MED ORDER — IPRATROPIUM-ALBUTEROL 0.5-2.5 (3) MG/3ML IN SOLN
3.0000 mL | Freq: Four times a day (QID) | RESPIRATORY_TRACT | Status: DC
  Administered 2021-01-31 (×4): 3 mL via RESPIRATORY_TRACT
  Filled 2021-01-31 (×3): qty 3

## 2021-01-31 MED ORDER — PERFLUTREN LIPID MICROSPHERE
1.0000 mL | INTRAVENOUS | Status: AC | PRN
  Administered 2021-01-31: 2 mL via INTRAVENOUS
  Filled 2021-01-31: qty 10

## 2021-01-31 MED ORDER — NOREPINEPHRINE 4 MG/250ML-% IV SOLN
0.0000 ug/min | INTRAVENOUS | Status: DC
  Filled 2021-01-31: qty 250

## 2021-01-31 MED ORDER — GUAIFENESIN-DM 100-10 MG/5ML PO SYRP
5.0000 mL | ORAL_SOLUTION | ORAL | Status: DC | PRN
  Administered 2021-02-05 (×2): 5 mL via ORAL
  Filled 2021-01-31 (×2): qty 5

## 2021-01-31 MED ORDER — IPRATROPIUM-ALBUTEROL 0.5-2.5 (3) MG/3ML IN SOLN
3.0000 mL | Freq: Two times a day (BID) | RESPIRATORY_TRACT | Status: DC
  Administered 2021-02-01 – 2021-02-02 (×3): 3 mL via RESPIRATORY_TRACT
  Filled 2021-01-31 (×4): qty 3

## 2021-01-31 MED ORDER — CHLORHEXIDINE GLUCONATE CLOTH 2 % EX PADS
6.0000 | MEDICATED_PAD | Freq: Every day | CUTANEOUS | Status: DC
  Administered 2021-01-31 – 2021-02-06 (×7): 6 via TOPICAL

## 2021-01-31 MED ORDER — FUROSEMIDE 10 MG/ML IJ SOLN
20.0000 mg | Freq: Once | INTRAMUSCULAR | Status: AC
  Administered 2021-01-31: 20 mg via INTRAVENOUS
  Filled 2021-01-31: qty 2

## 2021-01-31 MED ORDER — NYSTATIN 100000 UNIT/GM EX POWD
Freq: Two times a day (BID) | CUTANEOUS | Status: DC
  Administered 2021-02-18: 1 via TOPICAL
  Filled 2021-01-31 (×10): qty 15

## 2021-01-31 MED ORDER — ONDANSETRON HCL 4 MG/2ML IJ SOLN: 4.0000 mg | Freq: Four times a day (QID) | INTRAMUSCULAR | Status: DC | PRN

## 2021-01-31 NOTE — Progress Notes (Addendum)
   01/31/21 0229  Vitals  Temp (!) 101.7 F (38.7 C)  Temp Source Oral  BP (!) 108/42  MAP (mmHg) (!) 58  BP Location Right Arm  BP Method Automatic  Patient Position (if appropriate) Lying  Pulse Rate (!) 109  Pulse Rate Source Monitor  Resp (!) 24  MEWS COLOR  MEWS Score Color Red  Oxygen Therapy  SpO2 96 %  O2 Device Nasal Cannula  O2 Flow Rate (L/min) 2 L/min  MEWS Score  MEWS Temp 2  MEWS Systolic 0  MEWS Pulse 1  MEWS RR 1  MEWS LOC 0  MEWS Score 4  Provider Notification  Provider Name/Title Adefeso  Date Provider Notified 01/31/21  Time Provider Notified 0230  Notification Type Page

## 2021-01-31 NOTE — Progress Notes (Signed)
RN called RT to assess patient after she started noticing a decline in his condition. RT assessed patient and observed shallow breathing and his BS are very diminished. Patient was given HHN of [place] and has been assessed for Q6 HHN. RT also obtained and ABG and followed up with placing patient on BIPAP. He was then transferred without incident to ICU on BIPAP. He will remain on BIPAP until MD follows up with patient and his status.

## 2021-01-31 NOTE — Progress Notes (Signed)
Rapid response was called around 2 AM due to shallow breaths and hypoxia inpatient.  At bedside, patient was somnolent, but was easily arousable, though he quickly goes back to sleep.  ABG was done and showed hypercapnic respiratory failure with PCO2 of 65.1, patient was placed on BiPAP (patient has history of OSA, but was uncooperative with CPAP at home). Vital signs showed respiratory rate of 21 bpm, pulse 96 bpm, BP 120/78 and O2 sat of 97% while on BiPAP.  Patient was taken to ICU for closer monitoring.

## 2021-01-31 NOTE — Progress Notes (Signed)
   01/31/21 0247  Vitals  Temp (!) 101 F (38.3 C)  Temp Source Oral  BP (!) 93/49  MAP (mmHg) (!) 61  BP Location Right Arm  BP Method Automatic  Patient Position (if appropriate) Lying  Pulse Rate (!) 105  Resp (!) 22  Level of Consciousness  Level of Consciousness Responds to Voice  MEWS COLOR  MEWS Score Color Red  Oxygen Therapy  SpO2 97 %  O2 Device Nasal Cannula  O2 Flow Rate (L/min) 2 L/min  MEWS Score  MEWS Temp 1  MEWS Systolic 1  MEWS Pulse 1  MEWS RR 1  MEWS LOC 1  MEWS Score 5   Rapid Response Called. Respiratory at bedside. EKG obtained.

## 2021-01-31 NOTE — Progress Notes (Signed)
Order placed to transfer patient to ICU. Report given to [facility] ICU RN. Attempt to call patients wife Eddie Fletcher with no answer. Patient transferred to ICU Bed 6.

## 2021-01-31 NOTE — Progress Notes (Signed)
Patient has bilateral expiratory wheezing, nonproductive cough. Patient 92% on 2L Nasal Cannula. Order for Lasix '20mg'$  IV given. Tylenol 650 mg PO given for patients temp of 102.1.

## 2021-01-31 NOTE — Progress Notes (Signed)
PROGRESS NOTE    Eddie Fletcher  [id number] DOB: 23-Feb-1956 DOA: 01/21/2021 PCP: Curly Rim, MD   Brief Narrative:   Eddie Fletcher is a 65 y.o. male with medical history significant for hypertension, chronic back pain, multiple falls, sleep apnea, morbid obesity, BPH who presents to the emergency department from [facility] via EMS due to 1 day onset of fever and was also noted to be somewhat more somnolent.  He is being actively treated with IV vancomycin for cellulitis of the scrotum and legs and the concern was his febrile episode as well as some hypoxemia.  Assessment & Plan:   Principal Problem:   Cellulitis of chest wall Active Problems:   Obstructive sleep apnea   Chronic low back pain   Morbid obesity with BMI of 60.0-69.9, adult (HCC)   Multiple falls   Elevated brain natriuretic peptide (BNP) level   Pulmonary edema   Splenomegaly, not elsewhere classified   Ascites   Chronic venous stasis dermatitis of both lower extremities   Chronic kidney disease   Acute febrile illness   Acute respiratory failure with hypoxia (HCC)   Cellulitis of scrotum   Acute fever with hypoxemia likely related to new pneumonia -Chest x-ray with possible infectious etiology and procalcitonin elevation noted -In the setting of current scrotal cellulitis, but does not appear to have significant extension to his chest wall -Continues currently on linezolid and cefepime and was on vancomycin outpatient for ongoing cellulitis treatment -Patient has PICC line  Acute hypoxemic respiratory failure secondary to pulmonary edema -We will have to hold Lasix due to hypotension -Repeat 2D echocardiogram pending with prior in 2016 -Wean oxygen as tolerated  Hypotension -Currently improving, continue to monitor -Holding diuretics  Splenomegaly and ascites -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 and moderate splenomegaly noted on CT scan -Plans for diuresis once able per  blood pressure  CKD stage IV -Baseline creatinine 1.8-2.5 -Continue to monitor  Chronic venous stasis dermatitis -Stable  OSA -Refuses CPAP  Chronic low back pain/multiple falls -Pain medications as needed -Fall precautions   Morbid obesity -Lifestyle changes outpatient   DVT prophylaxis:Lovenox Code Status: Full Family Communication: Wife at bedside 9/24 Disposition Plan:  Status is: Inpatient  Remains inpatient appropriate because:IV treatments appropriate due to intensity of illness or inability to take PO and Inpatient level of care appropriate due to severity of illness  Dispo: The patient is from: SNF              Anticipated d/c is to: SNF              Patient currently is not medically stable to d/c.   Difficult to place patient No   Consultants:  None  Procedures:  See below  Antimicrobials:  Anti-infectives (From admission, onward)    Start     Dose/Rate Route Frequency Ordered Stop   01/31/21 0330  ceFEPIme (MAXIPIME) 2 g in sodium chloride 0.9 % 100 mL IVPB        2 g 200 mL/hr over 30 Minutes Intravenous Every 12 hours 02/03/2021 2214 02/07/21 0329   01/31/21 0030  linezolid (ZYVOX) tablet 600 mg        600 mg Oral Every 12 hours 01/21/2021 2344 02/06/21 2159   01/25/2021 2330  vancomycin (VANCOREADY) IVPB 1500 mg/300 mL  Status:  Discontinued       See Hyperspace for full Linked Orders Report.   1,500 mg 150 mL/hr over 120 Minutes Intravenous  Once 02/06/2021 2148  01/25/2021 2151   01/25/2021 2230  vancomycin (VANCOCIN) IVPB 1000 mg/200 mL premix  Status:  Discontinued       See Hyperspace for full Linked Orders Report.   1,000 mg 200 mL/hr over 60 Minutes Intravenous  Once 01/11/2021 2148 02/03/2021 2151   01/17/2021 2145  vancomycin (VANCOCIN) IVPB 1000 mg/200 mL premix  Status:  Discontinued        1,000 mg 200 mL/hr over 60 Minutes Intravenous  Once 01/08/2021 2140 01/26/2021 2145   01/24/2021 2145  ceFEPIme (MAXIPIME) 2 g in sodium chloride 0.9 % 100 mL IVPB   Status:  Discontinued        2 g 200 mL/hr over 30 Minutes Intravenous  Once 01/20/2021 2140 02/01/2021 2212   02/04/2021 1830  aztreonam (AZACTAM) 2 g in sodium chloride 0.9 % 100 mL IVPB        2 g 200 mL/hr over 30 Minutes Intravenous  Once 01/26/2021 1821 01/09/2021 2034      Subjective: Patient seen and evaluated today with no new acute complaints or concerns. No acute concerns or events noted overnight.  Objective: Vitals:   01/31/21 0744 01/31/21 0800 01/31/21 0900 01/31/21 1148  BP:  (!) 99/16 91/74   Pulse: 88 93 98 83  Resp: (!) '22 20 20 19  '$ Temp: 99.7 F (37.6 C)   99 F (37.2 C)  TempSrc: Oral   Oral  SpO2: 96% 97% 100% 97%  Weight:      Height:        Intake/Output Summary (Last 24 hours) at 01/31/2021 1221 Last data filed at 01/31/2021 0301 Gross per 24 hour  Intake 340 ml  Output 100 ml  Net 240 ml   Filed Weights   01/28/2021 1439 01/10/2021 2223 01/31/21 0512  Weight: (!) 172.4 kg (!) 175.3 kg (!) 176.2 kg    Examination:  General exam: Appears calm and comfortable, morbidly obese Respiratory system: Clear to auscultation. Respiratory effort normal.  Currently on nasal cannula Cardiovascular system: S1 & S2 heard, RRR.  Gastrointestinal system: Abdomen is soft Central nervous system: Alert and awake Extremities: Venous stasis ulcerations to bilateral lower extremities Skin: Scrotal erythema noted Psychiatry: Flat affect.    Data Reviewed: I have personally reviewed following labs and imaging studies  CBC: Recent Labs  Lab 01/10/2021 1715 01/31/21 0327  WBC 11.4* 9.6  NEUTROABS 9.0*  --   HGB 8.9* 8.2*  HCT 28.8* 26.0*  MCV 93.2 93.2  PLT 114* 97*   Basic Metabolic Panel: Recent Labs  Lab 01/12/2021 1715 01/31/21 0327  NA 136 135  K 3.6 3.9  CL 104 103  CO2 26 25  GLUCOSE 111* 100*  BUN 22 24*  CREATININE 2.63* 2.73*  CALCIUM 7.6* 7.5*  MG  --  1.7  PHOS  --  3.8   GFR: Estimated Creatinine Clearance: 42.1 mL/min (A) (by C-G formula based  on SCr of 2.73 mg/dL (H)). Liver Function Tests: Recent Labs  Lab 01/31/21 0327  AST 33  ALT 21  ALKPHOS 91  BILITOT 1.2  PROT 6.1*  ALBUMIN 1.8*   No results for input(s): LIPASE, AMYLASE in the last 168 hours. No results for input(s): AMMONIA in the last 168 hours. Coagulation Profile: Recent Labs  Lab 01/31/21 0327  INR 1.7*   Cardiac Enzymes: No results for input(s): CKTOTAL, CKMB, CKMBINDEX, TROPONINI in the last 168 hours. BNP (last 3 results) No results for input(s): PROBNP in the last 8760 hours. HbA1C: No results for input(s): HGBA1C  in the last 72 hours. CBG: Recent Labs  Lab 01/31/21 0234  GLUCAP 89   Lipid Profile: No results for input(s): CHOL, HDL, LDLCALC, TRIG, CHOLHDL, LDLDIRECT in the last 72 hours. Thyroid Function Tests: No results for input(s): TSH, T4TOTAL, FREET4, T3FREE, THYROIDAB in the last 72 hours. Anemia Panel: No results for input(s): VITAMINB12, FOLATE, FERRITIN, TIBC, IRON, RETICCTPCT in the last 72 hours. Sepsis Labs: Recent Labs  Lab 01/31/21 0327  PROCALCITON 33.06  LATICACIDVEN 1.7    Recent Results (from the past 240 hour(s))  Culture, blood (single) w Reflex to ID Panel     Status: None (Preliminary result)   Collection Time: 01/20/2021  5:15 PM   Specimen: BLOOD LEFT HAND  Result Value Ref Range Status   Specimen Description BLOOD LEFT HAND  Final   Special Requests   Final    BOTTLES DRAWN AEROBIC AND ANAEROBIC Blood Culture results may not be optimal due to an excessive volume of blood received in culture bottles   Culture   Final    NO GROWTH < 24 HOURS Performed at [facility], [address] [place], [place] [address]    Report Status PENDING  Incomplete  Resp Panel by RT-PCR (Flu A&B, Covid) Nasopharyngeal Swab     Status: None   Collection Time: 01/18/2021  5:45 PM   Specimen: Nasopharyngeal Swab; Nasopharyngeal(NP) swabs in vial transport medium  Result Value Ref Range Status   SARS Coronavirus 2 by RT PCR  NEGATIVE NEGATIVE Final    Comment: (NOTE) SARS-CoV-2 target nucleic acids are NOT DETECTED.  The SARS-CoV-2 RNA is generally detectable in upper respiratory specimens during the acute phase of infection. The lowest concentration of SARS-CoV-2 viral copies this assay can detect is 138 copies/mL. A negative result does not preclude SARS-Cov-2 infection and should not be used as the sole basis for treatment or other patient management decisions. A negative result may occur with  improper specimen collection/handling, submission of specimen other than nasopharyngeal swab, presence of viral mutation(s) within the areas targeted by this assay, and inadequate number of viral copies(<138 copies/mL). A negative result must be combined with clinical observations, patient history, and epidemiological information. The expected result is Negative.  Fact Sheet for Patients:  EntrepreneurPulse.com.au  Fact Sheet for Healthcare Providers:  IncredibleEmployment.be  This test is no t yet approved or cleared by the [place] FDA and  has been authorized for detection and/or diagnosis of SARS-CoV-2 by FDA under an Emergency Use Authorization (EUA). This EUA will remain  in effect (meaning this test can be used) for the duration of the COVID-19 declaration under Section 564(b)(1) of the Act, 21 U.S.C.section 360bbb-3(b)(1), unless the authorization is terminated  or revoked sooner.       Influenza A by PCR NEGATIVE NEGATIVE Final   Influenza B by PCR NEGATIVE NEGATIVE Final    Comment: (NOTE) The Xpert Xpress SARS-CoV-2/FLU/RSV plus assay is intended as an aid in the diagnosis of influenza from Nasopharyngeal swab specimens and should not be used as a sole basis for treatment. Nasal washings and aspirates are unacceptable for Xpert Xpress SARS-CoV-2/FLU/RSV testing.  Fact Sheet for Patients: EntrepreneurPulse.com.au  Fact Sheet for  Healthcare Providers: IncredibleEmployment.be  This test is not yet approved or cleared by the [place] FDA and has been authorized for detection and/or diagnosis of SARS-CoV-2 by FDA under an Emergency Use Authorization (EUA). This EUA will remain in effect (meaning this test can be used) for the duration of the COVID-19 declaration under  Section 564(b)(1) of the Act, 21 U.S.C. section 360bbb-3(b)(1), unless the authorization is terminated or revoked.  Performed at [facility], [address] [place], [place] [address]   MRSA Next Gen by PCR, Nasal     Status: None   Collection Time: 02/04/2021 11:44 PM   Specimen: Nasal Mucosa; Nasal Swab  Result Value Ref Range Status   MRSA by PCR Next Gen NOT DETECTED NOT DETECTED Final    Comment: (NOTE) The GeneXpert MRSA Assay (FDA approved for NASAL specimens only), is one component of a comprehensive MRSA colonization surveillance program. It is not intended to diagnose MRSA infection nor to guide or monitor treatment for MRSA infections. Test performance is not FDA approved in patients less than 63 years old. Performed at [facility], [address], [place], [place] [address]          Radiology Studies: CT ABDOMEN PELVIS WO CONTRAST  Result Date: 01/31/2021 CLINICAL DATA:  Splenomegaly EXAM: CT ABDOMEN AND PELVIS WITHOUT CONTRAST TECHNIQUE: Multidetector CT imaging of the abdomen and pelvis was performed following the standard protocol without IV contrast. COMPARISON:  05/01/2004 FINDINGS: Imaging is slightly limited by respiratory motion artifact. Lower chest: No acute abnormality. Hepatobiliary: The liver is slightly small in size and demonstrates subtle contour nodularity best appreciated on coronal imaging possibly reflecting changes of underlying cirrhosis. No definite focal intrahepatic mass identified on this noncontrast examination. No intra or extrahepatic biliary ductal dilation. Cholelithiasis noted  without pericholecystic inflammatory change. Pancreas: Unremarkable Spleen: There is moderate splenomegaly with the spleen measuring at least 17.9 cm in greatest dimension. No definite intrasplenic lesion identified on this noncontrast examination. Small splenule noted within the splenic hilum. Adrenals/Urinary Tract: Moderate left asymmetric cortical atrophy. Staghorn calculus extends from the left renal pelvis into the a lower pole calices of the left kidney. There is no hydronephrosis. Right kidney is unremarkable. No ureteral calculi. Bladder is unremarkable. Stomach/Bowel: There is mild ascites present. Duodenal malrotation noted. Long segment bowel wall thickening involving the ascending and proximal transverse colon may reflect changes of portal colopathy given additional findings. No significant pericolonic inflammatory stranding identified. No evidence of obstruction or focal inflammation. The stomach, small bowel, and large bowel are otherwise unremarkable. The appendix is not clearly identified, though this may relate to motion artifact. Vascular/Lymphatic: There is recanalization of the umbilical vein as well as a splenorenal shunt. Mild aortoiliac atherosclerotic calcification. No aortic aneurysm. No pathologic adenopathy within the abdomen and pelvis. Reproductive: Prostate is unremarkable. Other: Mild diffuse subcutaneous body wall edema. No abdominal wall hernia. Rectum unremarkable. Musculoskeletal: No acute bone abnormality. Degenerative changes are seen within the lumbar spine. IMPRESSION: Findings in keeping with cirrhosis with portal venous hypertension with portosystemic collateral formation, moderate splenomegaly, and mild ascites. This could be confirmed with hepatic elastography or tissue sampling. Left renal staghorn calculus with moderate asymmetric left renal cortical atrophy. No hydronephrosis. 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 Long segment circumferential bowel wall  thickening involving the ascending and proximal transverse colon without significant pericolonic inflammatory stranding most in keeping with changes of portal colopathy. Mild infectious or inflammatory colitis, however, could appear similarly. Aortic Atherosclerosis (ICD10-I70.0). Electronically Signed   By: Fidela Salisbury M.D.   On: 01/31/2021 01:24   CT Chest Wo Contrast  Result Date: 01/25/2021 CLINICAL DATA:  Shortness of breath. EXAM: CT CHEST WITHOUT CONTRAST TECHNIQUE: Multidetector CT imaging of the chest was performed following the standard protocol without IV contrast. COMPARISON:  None. FINDINGS: Cardiovascular: A left-sided venous catheter is  in place. No significant vascular findings. Normal heart size. No pericardial effusion. Mediastinum/Nodes: No enlarged mediastinal or axillary lymph nodes. Thyroid gland, trachea, and esophagus demonstrate no significant findings. Lungs/Pleura: Mild linear scarring and/or atelectasis is seen within the posteromedial aspect of the left upper lobe and lateral aspect of the left lung base. There is no evidence of acute infiltrate, pleural effusion or pneumothorax. Upper Abdomen: Subcentimeter gallstones are seen within the lumen of a moderately distended gallbladder. There is moderate to marked severity splenomegaly. A mild amount of ascites is present. Musculoskeletal: Marked severity subcutaneous and para muscular inflammatory fat stranding is seen along the lateral aspect of the mid to lower left chest wall. This extends along the visualized portion of the lateral left abdominal wall. Degenerative changes are seen throughout the thoracic spine. Other: It should be noted that the study is limited secondary to patient motion. IMPRESSION: 1. Marked severity cellulitis along the lateral aspect of the mid and lower left chest wall. 2. Moderate to marked severity splenomegaly. 3. Cholelithiasis. 4. Mild amount of ascites. Electronically Signed   By: Virgina Norfolk  M.D.   On: 01/25/2021 19:24   DG Chest Port 1 View  Result Date: 01/14/2021 CLINICAL DATA:  Shortness of breath EXAM: PORTABLE CHEST 1 VIEW COMPARISON:  12/17/2020 FINDINGS: Cardiomegaly. Pulmonary vascular congestion. Diffuse interstitial prominence. Left lung base is largely obscured. A left-sided pleural effusion is not excluded. No pneumothorax. A left-sided PICC line terminates at the level of the SVC. IMPRESSION: Cardiomegaly with findings suggestive of CHF with pulmonary edema. A superimposed infectious process would be difficult to exclude. Electronically Signed   By: Davina Poke D.O.   On: 01/27/2021 17:29        Scheduled Meds:  Chlorhexidine Gluconate Cloth  6 each Topical Daily   enoxaparin (LOVENOX) injection  40 mg Subcutaneous QHS   ipratropium-albuterol  3 mL Nebulization Q6H   linezolid  600 mg Oral Q12H   nystatin   Topical BID   Continuous Infusions:  ceFEPime (MAXIPIME) IV 2 g (01/31/21 0230)   norepinephrine (LEVOPHED) Adult infusion       LOS: 1 day    Time spent: 35 minutes    Lamees Gable Darleen Crocker, DO [facility]  If 7PM-7AM, please contact night-coverage [email or web address] 01/31/2021, 12:21 PM

## 2021-01-31 NOTE — Progress Notes (Signed)
PCO2 65.1, Bipap placed on patient. Dr. Josephine Cables at bedside.

## 2021-01-31 NOTE — Progress Notes (Signed)
  Echocardiogram 2D Echocardiogram has been performed.  Eddie Fletcher 01/31/2021, 8:17 AM

## 2021-01-31 NOTE — Progress Notes (Signed)
   01/31/21 0134  Vitals  Temp (!) 102.1 F (38.9 C)  Temp Source Oral  BP (!) 129/59  MAP (mmHg) 78  BP Location Right Arm  BP Method Automatic  Patient Position (if appropriate) Lying  Pulse Rate (!) 118  Pulse Rate Source Monitor  Resp (!) 22  MEWS COLOR  MEWS Score Color Red  Oxygen Therapy  SpO2 92 %  O2 Device Nasal Cannula  O2 Flow Rate (L/min) 2 L/min  MEWS Score  MEWS Temp 2  MEWS Systolic 0  MEWS Pulse 2  MEWS RR 1  MEWS LOC 0  MEWS Score 5  Provider Notification  Provider Name/Title Adefeso  Date Provider Notified 01/31/21  Time Provider Notified [phone]  Notification Type Page  Notification Reason Other (Comment) (MEWS Red 5)

## 2021-02-01 ENCOUNTER — Encounter (HOSPITAL_COMMUNITY): Payer: Self-pay | Admitting: Internal Medicine

## 2021-02-01 DIAGNOSIS — L03313 Cellulitis of chest wall: Secondary | ICD-10-CM | POA: Diagnosis not present

## 2021-02-01 LAB — CBC
HCT: 26.9 % — ABNORMAL LOW (ref 39.0–52.0)
Hemoglobin: 8.4 g/dL — ABNORMAL LOW (ref 13.0–17.0)
MCH: 29.4 pg (ref 26.0–34.0)
MCHC: 31.2 g/dL (ref 30.0–36.0)
MCV: 94.1 fL (ref 80.0–100.0)
Platelets: 90 10*3/uL — ABNORMAL LOW (ref 150–400)
RBC: 2.86 MIL/uL — ABNORMAL LOW (ref 4.22–5.81)
RDW: 16.4 % — ABNORMAL HIGH (ref 11.5–15.5)
WBC: 8.6 10*3/uL (ref 4.0–10.5)
nRBC: 0 % (ref 0.0–0.2)

## 2021-02-01 LAB — BASIC METABOLIC PANEL
Anion gap: 4 — ABNORMAL LOW (ref 5–15)
BUN: 27 mg/dL — ABNORMAL HIGH (ref 8–23)
CO2: 27 mmol/L (ref 22–32)
Calcium: 7.2 mg/dL — ABNORMAL LOW (ref 8.9–10.3)
Chloride: 101 mmol/L (ref 98–111)
Creatinine, Ser: 2.77 mg/dL — ABNORMAL HIGH (ref 0.61–1.24)
GFR, Estimated: 25 mL/min — ABNORMAL LOW (ref >60)
Glucose, Bld: 155 mg/dL — ABNORMAL HIGH (ref 70–99)
Potassium: 3.6 mmol/L (ref 3.5–5.1)
Sodium: 132 mmol/L — ABNORMAL LOW (ref 135–145)

## 2021-02-01 LAB — URINE CULTURE: Culture: NO GROWTH

## 2021-02-01 LAB — MAGNESIUM: Magnesium: 1.9 mg/dL (ref 1.7–2.4)

## 2021-02-01 MED ORDER — FUROSEMIDE 10 MG/ML IJ SOLN
40.0000 mg | Freq: Two times a day (BID) | INTRAMUSCULAR | Status: DC
  Administered 2021-02-01 (×2): 40 mg via INTRAVENOUS
  Filled 2021-02-01 (×2): qty 4

## 2021-02-01 MED ORDER — ENOXAPARIN SODIUM 80 MG/0.8ML IJ SOSY
80.0000 mg | PREFILLED_SYRINGE | Freq: Every day | INTRAMUSCULAR | Status: DC
  Administered 2021-02-01 – 2021-02-02 (×2): 80 mg via SUBCUTANEOUS
  Filled 2021-02-01 (×2): qty 0.8

## 2021-02-01 NOTE — Progress Notes (Signed)
PROGRESS NOTE    KRIMSON [facility]  [id number] DOB: 1955-10-30 DOA: 01/11/2021 PCP: Curly Rim, MD   Brief Narrative:   Eddie Fletcher is a 65 y.o. male with medical history significant for hypertension, chronic back pain, multiple falls, sleep apnea, morbid obesity, BPH who presents to the emergency department from [facility] via EMS due to 1 day onset of fever and was also noted to be somewhat more somnolent.  He is being actively treated with IV vancomycin for cellulitis of the scrotum and legs and the concern was his febrile episode as well as some hypoxemia.  Assessment & Plan:   Principal Problem:   Cellulitis of chest wall Active Problems:   Obstructive sleep apnea   Chronic low back pain   Morbid obesity with BMI of 60.0-69.9, adult (HCC)   Multiple falls   Elevated brain natriuretic peptide (BNP) level   Pulmonary edema   Splenomegaly, not elsewhere classified   Ascites   Chronic venous stasis dermatitis of both lower extremities   Chronic kidney disease   Acute febrile illness   Acute respiratory failure with hypoxia (HCC)   Cellulitis of scrotum   Acute fever with hypoxemia likely related to new pneumonia -Chest x-ray with possible infectious etiology and procalcitonin elevation noted -In the setting of current scrotal cellulitis, but does not appear to have significant extension to his chest wall -Continues currently on linezolid and cefepime and was on vancomycin outpatient for ongoing cellulitis treatment for total 6 weeks -Patient has PICC line   Acute hypoxemic respiratory failure secondary to pulmonary edema -Started IV Lasix 9/25 due to improvement in blood pressure readings -Repeat 2D echocardiogram on 9/24 with LVEF 65-70% -Wean oxygen as tolerated   Splenomegaly and ascites -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 and moderate splenomegaly noted on CT scan -Plans for diuresis starting 9/25   CKD stage IV -Baseline creatinine  1.8-2.5 -Continue to monitor   Chronic venous stasis dermatitis -Stable   OSA -Refuses CPAP   Chronic low back pain/multiple falls -Pain medications as needed -Fall precautions    Morbid obesity -Lifestyle changes outpatient     DVT prophylaxis:Lovenox Code Status: Full Family Communication: Wife at bedside 9/24 Disposition Plan:  Status is: Inpatient   Remains inpatient appropriate because:IV treatments appropriate due to intensity of illness or inability to take PO and Inpatient level of care appropriate due to severity of illness   Dispo: The patient is from: SNF              Anticipated d/c is to: SNF at a new location              Patient currently is not medically stable to d/c.              Difficult to place patient No     Consultants:  None   Procedures:  See below  Antimicrobials:  Anti-infectives (From admission, onward)    Start     Dose/Rate Route Frequency Ordered Stop   01/31/21 0330  ceFEPIme (MAXIPIME) 2 g in sodium chloride 0.9 % 100 mL IVPB        2 g 200 mL/hr over 30 Minutes Intravenous Every 12 hours 01/22/2021 2214 02/07/21 0329   01/31/21 0030  linezolid (ZYVOX) tablet 600 mg        600 mg Oral Every 12 hours 01/18/2021 2344 02/06/21 2159   01/21/2021 2330  vancomycin (VANCOREADY) IVPB 1500 mg/300 mL  Status:  Discontinued  See Hyperspace for full Linked Orders Report.   1,500 mg 150 mL/hr over 120 Minutes Intravenous  Once 01/20/2021 2148 01/09/2021 2151   01/08/2021 2230  vancomycin (VANCOCIN) IVPB 1000 mg/200 mL premix  Status:  Discontinued       See Hyperspace for full Linked Orders Report.   1,000 mg 200 mL/hr over 60 Minutes Intravenous  Once 02/03/2021 2148 01/10/2021 2151   01/26/2021 2145  vancomycin (VANCOCIN) IVPB 1000 mg/200 mL premix  Status:  Discontinued        1,000 mg 200 mL/hr over 60 Minutes Intravenous  Once 01/19/2021 2140 01/15/2021 2145   01/12/2021 2145  ceFEPIme (MAXIPIME) 2 g in sodium chloride 0.9 % 100 mL IVPB  Status:   Discontinued        2 g 200 mL/hr over 30 Minutes Intravenous  Once 01/29/2021 2140 01/18/2021 2212   01/28/2021 1830  aztreonam (AZACTAM) 2 g in sodium chloride 0.9 % 100 mL IVPB        2 g 200 mL/hr over 30 Minutes Intravenous  Once 02/01/2021 1821 01/09/2021 2034      Subjective: Patient seen and evaluated today with no new acute complaints or concerns. No acute concerns or events noted overnight.  His blood pressure readings have improved.  He states that he does not want to go back to [place].  Objective: Vitals:   02/01/21 0749 02/01/21 0800 02/01/21 0801 02/01/21 0900  BP:  (!) 132/45  (!) 161/41  Pulse: 82 86  87  Resp: 18 20  (!) 21  Temp: 98 F (36.7 C)     TempSrc: Oral     SpO2: 91% (!) 84% 94% 90%  Weight:      Height:        Intake/Output Summary (Last 24 hours) at 02/01/2021 1015 Last data filed at 02/01/2021 0900 Gross per 24 hour  Intake 430.89 ml  Output 1100 ml  Net -669.11 ml   Filed Weights   01/20/2021 2223 01/31/21 0512 02/01/21 0600  Weight: (!) 175.3 kg (!) 176.2 kg (!) 179.7 kg    Examination:  General exam: Appears calm and comfortable, morbidly obese Respiratory system: Clear to auscultation. Respiratory effort normal.  On 2 L nasal cannula Cardiovascular system: S1 & S2 heard, RRR.  Gastrointestinal system: Abdomen is soft Central nervous system: Alert and awake Extremities: Chronic venous stasis edema Skin: No significant lesions noted Psychiatry: Flat affect.    Data Reviewed: I have personally reviewed following labs and imaging studies  CBC: Recent Labs  Lab 01/24/2021 1715 01/31/21 0327 02/01/21 0401  WBC 11.4* 9.6 8.6  NEUTROABS 9.0*  --   --   HGB 8.9* 8.2* 8.4*  HCT 28.8* 26.0* 26.9*  MCV 93.2 93.2 94.1  PLT 114* 97* 90*   Basic Metabolic Panel: Recent Labs  Lab 01/29/2021 1715 01/31/21 0327 02/01/21 0401  NA 136 135 132*  K 3.6 3.9 3.6  CL 104 103 101  CO2 '26 25 27  '$ GLUCOSE 111* 100* 155*  BUN 22 24* 27*  CREATININE  2.63* 2.73* 2.77*  CALCIUM 7.6* 7.5* 7.2*  MG  --  1.7 1.9  PHOS  --  3.8  --    GFR: Estimated Creatinine Clearance: 42 mL/min (A) (by C-G formula based on SCr of 2.77 mg/dL (H)). Liver Function Tests: Recent Labs  Lab 01/31/21 0327  AST 33  ALT 21  ALKPHOS 91  BILITOT 1.2  PROT 6.1*  ALBUMIN 1.8*   No results for input(s): LIPASE, AMYLASE  in the last 168 hours. No results for input(s): AMMONIA in the last 168 hours. Coagulation Profile: Recent Labs  Lab 01/31/21 0327  INR 1.7*   Cardiac Enzymes: No results for input(s): CKTOTAL, CKMB, CKMBINDEX, TROPONINI in the last 168 hours. BNP (last 3 results) No results for input(s): PROBNP in the last 8760 hours. HbA1C: No results for input(s): HGBA1C in the last 72 hours. CBG: Recent Labs  Lab 01/31/21 0234  GLUCAP 89   Lipid Profile: No results for input(s): CHOL, HDL, LDLCALC, TRIG, CHOLHDL, LDLDIRECT in the last 72 hours. Thyroid Function Tests: No results for input(s): TSH, T4TOTAL, FREET4, T3FREE, THYROIDAB in the last 72 hours. Anemia Panel: No results for input(s): VITAMINB12, FOLATE, FERRITIN, TIBC, IRON, RETICCTPCT in the last 72 hours. Sepsis Labs: Recent Labs  Lab 01/31/21 0327  PROCALCITON 33.06  LATICACIDVEN 1.7    Recent Results (from the past 240 hour(s))  Culture, blood (single) w Reflex to ID Panel     Status: None (Preliminary result)   Collection Time: 01/24/2021  5:15 PM   Specimen: BLOOD LEFT HAND  Result Value Ref Range Status   Specimen Description BLOOD LEFT HAND  Final   Special Requests   Final    BOTTLES DRAWN AEROBIC AND ANAEROBIC Blood Culture results may not be optimal due to an excessive volume of blood received in culture bottles   Culture   Final    NO GROWTH 2 DAYS Performed at [facility], [address], [place], [place] [address]    Report Status PENDING  Incomplete  Resp Panel by RT-PCR (Flu A&B, Covid) Nasopharyngeal Swab     Status: None   Collection Time: 02/02/2021  5:45  PM   Specimen: Nasopharyngeal Swab; Nasopharyngeal(NP) swabs in vial transport medium  Result Value Ref Range Status   SARS Coronavirus 2 by RT PCR NEGATIVE NEGATIVE Final    Comment: (NOTE) SARS-CoV-2 target nucleic acids are NOT DETECTED.  The SARS-CoV-2 RNA is generally detectable in upper respiratory specimens during the acute phase of infection. The lowest concentration of SARS-CoV-2 viral copies this assay can detect is 138 copies/mL. A negative result does not preclude SARS-Cov-2 infection and should not be used as the sole basis for treatment or other patient management decisions. A negative result may occur with  improper specimen collection/handling, submission of specimen other than nasopharyngeal swab, presence of viral mutation(s) within the areas targeted by this assay, and inadequate number of viral copies(<138 copies/mL). A negative result must be combined with clinical observations, patient history, and epidemiological information. The expected result is Negative.  Fact Sheet for Patients:  EntrepreneurPulse.com.au  Fact Sheet for Healthcare Providers:  IncredibleEmployment.be  This test is no t yet approved or cleared by the [place] FDA and  has been authorized for detection and/or diagnosis of SARS-CoV-2 by FDA under an Emergency Use Authorization (EUA). This EUA will remain  in effect (meaning this test can be used) for the duration of the COVID-19 declaration under Section 564(b)(1) of the Act, 21 U.S.C.section 360bbb-3(b)(1), unless the authorization is terminated  or revoked sooner.       Influenza A by PCR NEGATIVE NEGATIVE Final   Influenza B by PCR NEGATIVE NEGATIVE Final    Comment: (NOTE) The Xpert Xpress SARS-CoV-2/FLU/RSV plus assay is intended as an aid in the diagnosis of influenza from Nasopharyngeal swab specimens and should not be used as a sole basis for treatment. Nasal washings and aspirates are  unacceptable for Xpert Xpress SARS-CoV-2/FLU/RSV testing.  Fact Sheet  for Patients: EntrepreneurPulse.com.au  Fact Sheet for Healthcare Providers: IncredibleEmployment.be  This test is not yet approved or cleared by the [place] FDA and has been authorized for detection and/or diagnosis of SARS-CoV-2 by FDA under an Emergency Use Authorization (EUA). This EUA will remain in effect (meaning this test can be used) for the duration of the COVID-19 declaration under Section 564(b)(1) of the Act, 21 U.S.C. section 360bbb-3(b)(1), unless the authorization is terminated or revoked.  Performed at [facility], [address], [place], [place] [address]   MRSA Next Gen by PCR, Nasal     Status: None   Collection Time: 01/10/2021 11:44 PM   Specimen: Nasal Mucosa; Nasal Swab  Result Value Ref Range Status   MRSA by PCR Next Gen NOT DETECTED NOT DETECTED Final    Comment: (NOTE) The GeneXpert MRSA Assay (FDA approved for NASAL specimens only), is one component of a comprehensive MRSA colonization surveillance program. It is not intended to diagnose MRSA infection nor to guide or monitor treatment for MRSA infections. Test performance is not FDA approved in patients less than 82 years old. Performed at [facility], [address] [place], [place] [address]          Radiology Studies: CT ABDOMEN PELVIS WO CONTRAST  Result Date: 01/31/2021 CLINICAL DATA:  Splenomegaly EXAM: CT ABDOMEN AND PELVIS WITHOUT CONTRAST TECHNIQUE: Multidetector CT imaging of the abdomen and pelvis was performed following the standard protocol without IV contrast. COMPARISON:  05/01/2004 FINDINGS: Imaging is slightly limited by respiratory motion artifact. Lower chest: No acute abnormality. Hepatobiliary: The liver is slightly small in size and demonstrates subtle contour nodularity best appreciated on coronal imaging possibly reflecting changes of underlying cirrhosis. No  definite focal intrahepatic mass identified on this noncontrast examination. No intra or extrahepatic biliary ductal dilation. Cholelithiasis noted without pericholecystic inflammatory change. Pancreas: Unremarkable Spleen: There is moderate splenomegaly with the spleen measuring at least 17.9 cm in greatest dimension. No definite intrasplenic lesion identified on this noncontrast examination. Small splenule noted within the splenic hilum. Adrenals/Urinary Tract: Moderate left asymmetric cortical atrophy. Staghorn calculus extends from the left renal pelvis into the a lower pole calices of the left kidney. There is no hydronephrosis. Right kidney is unremarkable. No ureteral calculi. Bladder is unremarkable. Stomach/Bowel: There is mild ascites present. Duodenal malrotation noted. Long segment bowel wall thickening involving the ascending and proximal transverse colon may reflect changes of portal colopathy given additional findings. No significant pericolonic inflammatory stranding identified. No evidence of obstruction or focal inflammation. The stomach, small bowel, and large bowel are otherwise unremarkable. The appendix is not clearly identified, though this may relate to motion artifact. Vascular/Lymphatic: There is recanalization of the umbilical vein as well as a splenorenal shunt. Mild aortoiliac atherosclerotic calcification. No aortic aneurysm. No pathologic adenopathy within the abdomen and pelvis. Reproductive: Prostate is unremarkable. Other: Mild diffuse subcutaneous body wall edema. No abdominal wall hernia. Rectum unremarkable. Musculoskeletal: No acute bone abnormality. Degenerative changes are seen within the lumbar spine. IMPRESSION: Findings in keeping with cirrhosis with portal venous hypertension with portosystemic collateral formation, moderate splenomegaly, and mild ascites. This could be confirmed with hepatic elastography or tissue sampling. Left renal staghorn calculus with moderate  asymmetric left renal cortical atrophy. No hydronephrosis. 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 Long segment circumferential bowel wall thickening involving the ascending and proximal transverse colon without significant pericolonic inflammatory stranding most in keeping with changes of portal colopathy. Mild infectious or inflammatory colitis, however, could appear similarly. Aortic Atherosclerosis (  ICD10-I70.0). Electronically Signed   By: Fidela Salisbury M.D.   On: 01/31/2021 01:24   CT Chest Wo Contrast  Result Date: 01/18/2021 CLINICAL DATA:  Shortness of breath. EXAM: CT CHEST WITHOUT CONTRAST TECHNIQUE: Multidetector CT imaging of the chest was performed following the standard protocol without IV contrast. COMPARISON:  None. FINDINGS: Cardiovascular: A left-sided venous catheter is in place. No significant vascular findings. Normal heart size. No pericardial effusion. Mediastinum/Nodes: No enlarged mediastinal or axillary lymph nodes. Thyroid gland, trachea, and esophagus demonstrate no significant findings. Lungs/Pleura: Mild linear scarring and/or atelectasis is seen within the posteromedial aspect of the left upper lobe and lateral aspect of the left lung base. There is no evidence of acute infiltrate, pleural effusion or pneumothorax. Upper Abdomen: Subcentimeter gallstones are seen within the lumen of a moderately distended gallbladder. There is moderate to marked severity splenomegaly. A mild amount of ascites is present. Musculoskeletal: Marked severity subcutaneous and para muscular inflammatory fat stranding is seen along the lateral aspect of the mid to lower left chest wall. This extends along the visualized portion of the lateral left abdominal wall. Degenerative changes are seen throughout the thoracic spine. Other: It should be noted that the study is limited secondary to patient motion. IMPRESSION: 1. Marked severity cellulitis along the lateral aspect of the mid and lower  left chest wall. 2. Moderate to marked severity splenomegaly. 3. Cholelithiasis. 4. Mild amount of ascites. Electronically Signed   By: Virgina Norfolk M.D.   On: 01/13/2021 19:24   DG Chest Port 1 View  Result Date: 01/29/2021 CLINICAL DATA:  Shortness of breath EXAM: PORTABLE CHEST 1 VIEW COMPARISON:  12/17/2020 FINDINGS: Cardiomegaly. Pulmonary vascular congestion. Diffuse interstitial prominence. Left lung base is largely obscured. A left-sided pleural effusion is not excluded. No pneumothorax. A left-sided PICC line terminates at the level of the SVC. IMPRESSION: Cardiomegaly with findings suggestive of CHF with pulmonary edema. A superimposed infectious process would be difficult to exclude. Electronically Signed   By: Davina Poke D.O.   On: 01/10/2021 17:29   ECHOCARDIOGRAM COMPLETE  Result Date: 01/31/2021    ECHOCARDIOGRAM REPORT   Patient Name:   Eddie Fletcher [facility] Date of Exam: 01/31/2021 Medical Rec #:  [id number]        Height:       66.0 in Accession #:    [id number]       Weight:       388.4 lb Date of Birth:  [birth year]        BSA:          2.652 m Patient Age:    80 years         BP:           96/47 mmHg Patient Gender: M                HR:           88 bpm. Exam Location:  [facility] Procedure: 2D Echo, Cardiac Doppler, Color Doppler and Intracardiac            Opacification Agent Indications:    CHF  History:        Patient has prior history of Echocardiogram examinations, most                 recent 01/09/2015. Cardiomegaly, Signs/Symptoms:Shortness of                 Breath, Fever and Pul. edema; Risk Factors:Hypertension, Sleep  Apnea and Morbid obesity.  Sonographer:    Dustin Flock RDCS Referring Phys: [id number] OLADAPO ADEFESO IMPRESSIONS  1. Left ventricular ejection fraction, by estimation, is 65 to 70%. The left ventricle has normal function. The left ventricle has no regional wall motion abnormalities. There is mild left ventricular hypertrophy. Left  ventricular diastolic parameters are indeterminate.  2. Right ventricular systolic function was not well visualized. The right ventricular size is not well visualized. Tricuspid regurgitation signal is inadequate for assessing PA pressure.  3. The mitral valve was not well visualized. No evidence of mitral valve regurgitation. No evidence of mitral stenosis.  4. The aortic valve has an indeterminant number of cusps. Aortic valve regurgitation is not visualized. No aortic stenosis is present. FINDINGS  Left Ventricle: Left ventricular ejection fraction, by estimation, is 65 to 70%. The left ventricle has normal function. The left ventricle has no regional wall motion abnormalities. Definity contrast agent was given IV to delineate the left ventricular  endocardial borders. The left ventricular internal cavity size was normal in size. There is mild left ventricular hypertrophy. Left ventricular diastolic parameters are indeterminate. Right Ventricle: RV poorly visualized. Grossly may be mildly enlarged, function appears likely normal. The right ventricular size is not well visualized. Right vetricular wall thickness was not well visualized. Right ventricular systolic function was not  well visualized. Tricuspid regurgitation signal is inadequate for assessing PA pressure. Left Atrium: Left atrial size was normal in size. Right Atrium: Right atrial size was not well visualized. Pericardium: There is no evidence of pericardial effusion. Mitral Valve: The mitral valve was not well visualized. No evidence of mitral valve regurgitation. No evidence of mitral valve stenosis. Tricuspid Valve: The tricuspid valve is not well visualized. Tricuspid valve regurgitation is trivial. No evidence of tricuspid stenosis. Aortic Valve: The aortic valve has an indeterminant number of cusps. Aortic valve regurgitation is not visualized. No aortic stenosis is present. Aortic valve mean gradient measures 7.0 mmHg. Aortic valve peak  gradient measures 14.7 mmHg. Aortic valve area, by VTI measures 3.97 cm. Pulmonic Valve: The pulmonic valve was not well visualized. Pulmonic valve regurgitation is trivial. No evidence of pulmonic stenosis. Aorta: The aortic root is normal in size and structure. IAS/Shunts: The interatrial septum was not well visualized.  LEFT VENTRICLE PLAX 2D LVIDd:         4.20 cm  Diastology LVIDs:         2.70 cm  LV e' medial:    7.94 cm/s LV PW:         1.20 cm  LV E/e' medial:  16.4 LV IVS:        1.10 cm  LV e' lateral:   7.07 cm/s LVOT diam:     2.10 cm  LV E/e' lateral: 18.4 LV SV:         106 LV SV Index:   40 LVOT Area:     3.46 cm  RIGHT VENTRICLE RV Basal diam:  3.90 cm RV S prime:     14.70 cm/s TAPSE (M-mode): 2.5 cm LEFT ATRIUM             Index       RIGHT ATRIUM           Index LA diam:        4.20 cm 1.58 cm/m  RA Area:     13.20 cm LA Vol (A2C):   44.7 ml 16.86 ml/m RA Volume:   26.80 ml  10.11 ml/m LA Vol (A4C):  41.2 ml 15.54 ml/m LA Biplane Vol: 44.6 ml 16.82 ml/m  AORTIC VALVE AV Area (Vmax):    2.72 cm AV Area (Vmean):   3.08 cm AV Area (VTI):     3.97 cm AV Vmax:           192.00 cm/s AV Vmean:          116.000 cm/s AV VTI:            0.267 m AV Peak Grad:      14.7 mmHg AV Mean Grad:      7.0 mmHg LVOT Vmax:         151.00 cm/s LVOT Vmean:        103.000 cm/s LVOT VTI:          0.306 m LVOT/AV VTI ratio: 1.15  AORTA Ao Root diam: 2.80 cm MITRAL VALVE MV Area (PHT): 4.52 cm     SHUNTS MV Decel Time: 168 msec     Systemic VTI:  0.31 m MV E velocity: 130.00 cm/s  Systemic Diam: 2.10 cm MV A velocity: 114.00 cm/s MV E/A ratio:  1.14 Carlyle Dolly MD Electronically signed by Carlyle Dolly MD Signature Date/Time: 01/31/2021/1:28:51 PM    Final         Scheduled Meds:  Chlorhexidine Gluconate Cloth  6 each Topical Daily   enoxaparin (LOVENOX) injection  80 mg Subcutaneous QHS   furosemide  40 mg Intravenous Q12H   gabapentin  100 mg Oral BID   ipratropium-albuterol  3 mL Nebulization  BID   linezolid  600 mg Oral Q12H   nystatin   Topical BID   Continuous Infusions:  ceFEPime (MAXIPIME) IV Stopped (02/01/21 0325)   norepinephrine (LEVOPHED) Adult infusion Stopped (01/31/21 1224)     LOS: 2 days    Time spent: 35 minutes    Penni Penado Darleen Crocker, DO [facility]  If 7PM-7AM, please contact night-coverage [email or web address] 02/01/2021, 10:15 AM

## 2021-02-01 NOTE — Evaluation (Signed)
Physical Therapy Evaluation Patient Details Name: Eddie Fletcher MRN: [id number] DOB: 1955-07-13 Today's Date: 02/01/2021  History of Present Illness  Eddie Fletcher is a 65 y.o. male with medical history significant for hypertension, chronic back pain, multiple falls, sleep apnea, morbid obesity, BPH who presents to the emergency department from [facility] via EMS due to 1 day onset of fever and was also noted to be somewhat more somnolent.  He is being actively treated with IV vancomycin for cellulitis of the scrotum and legs and the concern was his febrile episode as well as some hypoxemia.  Clinical Impression  PT limited at this point due to respiratory not physical issues.        Recommendations for follow up therapy are one component of a multi-disciplinary discharge planning process, led by the attending physician.  Recommendations may be updated based on patient status, additional functional criteria and insurance authorization.  Follow Up Recommendations SNF    Equipment Recommendations  None recommended by PT    Recommendations for Other Services       Precautions / Restrictions Precautions Precautions: Fall Restrictions Weight Bearing Restrictions: No      Mobility  Bed Mobility Overal bed mobility: Modified Independent             General bed mobility comments: PT sat bedside with O2 dropping to 50, recovered with diaphragmic breathing.  This is without O2.  Each time therapist engaged conversation or attempted motion O2 would drop, therefore treatment was suspended.                       Pertinent Vitals/Pain Pain Assessment: 0-10 Pain Score: 4  Pain Location: scrotal Pain Descriptors / Indicators: Aching Pain Intervention(s): Monitored during session    Home Living    From SNF                    Prior Function    Ambulating with PT at SNF with RW             Hand Dominance        Extremity/Trunk Assessment         Lower Extremity Assessment Lower Extremity Assessment: Generalized weakness       Communication      Cognition Arousal/Alertness: Awake/alert Behavior During Therapy: WFL for tasks assessed/performed Overall Cognitive Status: Within Functional Limits for tasks assessed                                               Assessment/Plan    PT Assessment Patient needs continued PT services  PT Problem List Decreased activity tolerance;Obesity;Decreased mobility       PT Treatment Interventions Gait training;Therapeutic exercise    PT Goals (Current goals can be found in the Care Plan section)  Acute Rehab PT Goals Potential to Achieve Goals: Good    Frequency Min 3X/week   Barriers to discharge           AM-PAC PT "6 Clicks" Mobility  Outcome Measure Help needed turning from your back to your side while in a flat bed without using bedrails?: A Little Help needed moving from lying on your back to sitting on the side of a flat bed without using bedrails?: A Little Help needed moving to and from a bed to a chair (including a wheelchair)?: A  Lot Help needed standing up from a chair using your arms (e.g., wheelchair or bedside chair)?: A Lot Help needed to walk in hospital room?: A Lot Help needed climbing 3-5 steps with a railing? : A Lot 6 Click Score: 14    End of Session   Activity Tolerance: Treatment limited secondary to medical complications (Comment) Patient left: in bed;with call bell/phone within reach Nurse Communication: Other (comment) (notified about O2 drop) PT Visit Diagnosis: Difficulty in walking, not elsewhere classified (R26.2);Muscle weakness (generalized) (M62.81)    Time: [id number] PT Time Calculation (min) (ACUTE ONLY): 25 min   Charges:   PT Evaluation $PT Eval Moderate Complexity: [place], PT CLT [phone]  02/01/2021, 11:56 AM

## 2021-02-01 NOTE — Plan of Care (Signed)
  Problem: Acute Rehab PT Goals(only PT should resolve) Goal: Pt Will Go Supine/Side To Sit Flowsheets (Taken 02/01/2021 1154) Pt will go Supine/Side to Sit: with modified independence Goal: Pt Will Go Sit To Supine/Side Flowsheets (Taken 02/01/2021 1154) Pt will go Sit to Supine/Side: with modified independence Goal: Patient Will Transfer Sit To/From Stand Flowsheets (Taken 02/01/2021 1154) Patient will transfer sit to/from stand: with modified independence Goal: Pt Will Ambulate Flowsheets (Taken 02/01/2021 1154) Pt will Ambulate:  25 feet  with min guard assist  with rolling walker

## 2021-02-02 DIAGNOSIS — L03313 Cellulitis of chest wall: Secondary | ICD-10-CM | POA: Diagnosis not present

## 2021-02-02 LAB — CBC
HCT: 27.4 % — ABNORMAL LOW (ref 39.0–52.0)
Hemoglobin: 8.4 g/dL — ABNORMAL LOW (ref 13.0–17.0)
MCH: 28.4 pg (ref 26.0–34.0)
MCHC: 30.7 g/dL (ref 30.0–36.0)
MCV: 92.6 fL (ref 80.0–100.0)
Platelets: 82 10*3/uL — ABNORMAL LOW (ref 150–400)
RBC: 2.96 MIL/uL — ABNORMAL LOW (ref 4.22–5.81)
RDW: 16 % — ABNORMAL HIGH (ref 11.5–15.5)
WBC: 7.5 10*3/uL (ref 4.0–10.5)
nRBC: 0 % (ref 0.0–0.2)

## 2021-02-02 LAB — BASIC METABOLIC PANEL
Anion gap: 6 (ref 5–15)
BUN: 31 mg/dL — ABNORMAL HIGH (ref 8–23)
CO2: 25 mmol/L (ref 22–32)
Calcium: 7.4 mg/dL — ABNORMAL LOW (ref 8.9–10.3)
Chloride: 102 mmol/L (ref 98–111)
Creatinine, Ser: 3.08 mg/dL — ABNORMAL HIGH (ref 0.61–1.24)
GFR, Estimated: 22 mL/min — ABNORMAL LOW (ref >60)
Glucose, Bld: 118 mg/dL — ABNORMAL HIGH (ref 70–99)
Potassium: 3.5 mmol/L (ref 3.5–5.1)
Sodium: 133 mmol/L — ABNORMAL LOW (ref 135–145)

## 2021-02-02 LAB — MAGNESIUM: Magnesium: 1.9 mg/dL (ref 1.7–2.4)

## 2021-02-02 MED ORDER — RIFAMPIN 300 MG PO CAPS
450.0000 mg | ORAL_CAPSULE | Freq: Two times a day (BID) | ORAL | Status: DC
  Filled 2021-02-02 (×5): qty 1

## 2021-02-02 MED ORDER — IPRATROPIUM-ALBUTEROL 0.5-2.5 (3) MG/3ML IN SOLN
3.0000 mL | Freq: Two times a day (BID) | RESPIRATORY_TRACT | Status: DC | PRN
  Administered 2021-02-05 – 2021-02-06 (×2): 3 mL via RESPIRATORY_TRACT
  Filled 2021-02-02 (×3): qty 3

## 2021-02-02 MED ORDER — FINASTERIDE 5 MG PO TABS
5.0000 mg | ORAL_TABLET | Freq: Every day | ORAL | Status: DC
  Administered 2021-02-02 – 2021-02-14 (×13): 5 mg via ORAL
  Filled 2021-02-02 (×14): qty 1

## 2021-02-02 MED ORDER — FUROSEMIDE 20 MG PO TABS
20.0000 mg | ORAL_TABLET | Freq: Every day | ORAL | Status: DC
  Administered 2021-02-02: 20 mg via ORAL
  Filled 2021-02-02: qty 1

## 2021-02-02 MED ORDER — TAMSULOSIN HCL 0.4 MG PO CAPS
0.4000 mg | ORAL_CAPSULE | Freq: Every day | ORAL | Status: DC
  Administered 2021-02-02 – 2021-02-13 (×12): 0.4 mg via ORAL
  Filled 2021-02-02 (×12): qty 1

## 2021-02-02 MED ORDER — SODIUM CHLORIDE 0.9 % IV SOLN: INTRAVENOUS | Status: AC

## 2021-02-02 MED ORDER — FLUTICASONE PROPIONATE 50 MCG/ACT NA SUSP
1.0000 | Freq: Every day | NASAL | Status: DC
  Administered 2021-02-02 – 2021-02-18 (×16): 1 via NASAL
  Filled 2021-02-02 (×2): qty 16

## 2021-02-02 MED ORDER — AMOXICILLIN-POT CLAVULANATE 875-125 MG PO TABS
1.0000 | ORAL_TABLET | Freq: Two times a day (BID) | ORAL | Status: AC
  Administered 2021-02-02 – 2021-02-06 (×10): 1 via ORAL
  Filled 2021-02-02 (×10): qty 1

## 2021-02-02 MED ORDER — VANCOMYCIN HCL 2000 MG/400ML IV SOLN
2000.0000 mg | Freq: Once | INTRAVENOUS | Status: AC
  Administered 2021-02-02: 2000 mg via INTRAVENOUS
  Filled 2021-02-02: qty 400

## 2021-02-02 MED ORDER — TOPIRAMATE 100 MG PO TABS
100.0000 mg | ORAL_TABLET | Freq: Every day | ORAL | Status: DC
  Administered 2021-02-02 – 2021-02-14 (×13): 100 mg via ORAL
  Filled 2021-02-02 (×13): qty 1

## 2021-02-02 MED ORDER — RIFAMPIN 300 MG PO CAPS
300.0000 mg | ORAL_CAPSULE | Freq: Three times a day (TID) | ORAL | Status: DC
  Administered 2021-02-02 – 2021-02-11 (×25): 300 mg via ORAL
  Filled 2021-02-02 (×34): qty 1

## 2021-02-02 MED ORDER — FOLIC ACID 1 MG PO TABS
1.0000 mg | ORAL_TABLET | Freq: Every day | ORAL | Status: DC
  Administered 2021-02-02 – 2021-02-14 (×13): 1 mg via ORAL
  Filled 2021-02-02 (×13): qty 1

## 2021-02-02 MED ORDER — VANCOMYCIN VARIABLE DOSE PER UNSTABLE RENAL FUNCTION (PHARMACIST DOSING): Status: DC

## 2021-02-02 MED ORDER — TRAMADOL HCL 50 MG PO TABS
50.0000 mg | ORAL_TABLET | Freq: Three times a day (TID) | ORAL | Status: DC | PRN
  Administered 2021-02-06 – 2021-02-08 (×3): 50 mg via ORAL
  Filled 2021-02-02 (×3): qty 1

## 2021-02-02 NOTE — Progress Notes (Addendum)
Pharmacy Antibiotic Note  Eddie Fletcher is a 65 y.o. male admitted on 01/21/2021 with cellulitis.  Pharmacy has been consulted for Daptomycin dosing.  Plan: Will transition from vancomycin to daptomycin due to weight/renal function as long as SNF agreeable to continue as outpatient. Will dose vancomycin based on levels for now. Start daptomycin 900 mg IV every 24 hours when changing. Plan is for 6 weeks of antibiotics from 9/7 Monitor labs, c/s, and patient improvement.  Height: '5\' 6"'$  (167.6 cm) Weight: (!) 178.8 kg (394 lb 2.9 oz) IBW/kg (Calculated) : 63.8  Temp (24hrs), Avg:98.5 F (36.9 C), Min:98.3 F (36.8 C), Max:98.9 F (37.2 C)  Recent Labs  Lab 01/15/2021 1715 02/01/2021 2228 01/31/21 0327 02/01/21 0401 02/02/21 0455  WBC 11.4*  --  9.6 8.6 7.5  CREATININE 2.63*  --  2.73* 2.77* 3.08*  LATICACIDVEN  --   --  1.7  --   --   VANCORANDOM  --  9  --   --   --     Estimated Creatinine Clearance: 37.6 mL/min (A) (by C-G formula based on SCr of 3.08 mg/dL (H)).    Allergies  Allergen Reactions   Latex Other (See Comments)    Latex cath caused burning and irritation Latex cath caused burning and irritation    Levaquin [Levofloxacin In D5w] Other (See Comments)    Muscle aches   Keflex [Cephalexin] Rash   Lisinopril Cough   Sulfa Antibiotics Rash    Antimicrobials this admission: Dapto 9/27 >>  Vanco 9/7>> 9/23 Linezolid 9/24 >> 9/6 Cefepime 9/23 >> 9/26 Rifampin 9/7 >>   Microbiology results: 9/23 BCx: ngtd 9/23 UCx: ng  9/23 MRSA PCR: neg  Thank you for allowing pharmacy to be a part of this patient's care.  Margot Ables, PharmD Clinical Pharmacist 02/02/2021 2:01 PM

## 2021-02-02 NOTE — NC FL2 (Signed)
[place] LEVEL OF CARE SCREENING TOOL     IDENTIFICATION  Patient Name: Eddie Fletcher Birthdate: Oct 01, 1955 Sex: male Admission Date (Current Location): 01/27/2021  [facility] and [place] Number:  [employer] and Address:  [place] [address], [place]      Provider Number: [id number]  Attending Physician Name and Address:  Rodena Goldmann, DO  Relative Name and Phone Number:       Current Level of Care: Hospital Recommended Level of Care: [place] Prior Approval Number:    Date Approved/Denied:   PASRR Number: [id number]  Discharge Plan: SNF    Current Diagnoses: Patient Active Problem List   Diagnosis Date Noted   Cellulitis of chest wall 01/12/2021   Elevated brain natriuretic peptide (BNP) level 01/18/2021   Pulmonary edema 01/23/2021   Splenomegaly, not elsewhere classified 01/24/2021   Ascites 01/10/2021   Chronic venous stasis dermatitis of both lower extremities 01/10/2021   Chronic kidney disease 01/20/2021   Acute febrile illness 01/27/2021   Acute respiratory failure with hypoxia ([place]) 01/26/2021   Cellulitis of scrotum [id number]   Acute metabolic encephalopathy [id number]   Acute kidney injury superimposed on CKD ([place]) 12/17/2020   Acute lower UTI 12/17/2020   Morbid obesity with BMI of 60.0-69.9, adult ([place]) 12/17/2020   Multiple falls 12/17/2020   Chronic low back pain 12/12/2020   Infected prosthetic knee joint ([place]) 01/09/2015   Sepsis due to Staphylococcus aureus ([place]) 01/09/2015   Elevated LFTs    Acute renal failure (HCC)    AKI (acute kidney injury) ([place]) 01/08/2015   Hypotension 01/08/2015   Septic joint of right knee joint ([place]) 01/08/2015   Septic joint of left knee joint ([place]) 01/03/2015   Besnier-Boeck disease 02/05/2014   Obstructive sleep apnea 02/05/2014   Avitaminosis D 02/05/2014   BP (high blood pressure) 02/05/2014   Colon polyp 02/05/2014    Osteoarthritis of right knee 02/05/2014   Total knee replacement status 01/30/2014    Orientation RESPIRATION BLADDER Height & Weight     Self, Time, Situation, Place  O2 (see dc summary) Continent Weight: (!) 394 lb 2.9 oz (178.8 kg) Height:  '5\' 6"'$  (167.6 cm)  BEHAVIORAL SYMPTOMS/MOOD NEUROLOGICAL BOWEL NUTRITION STATUS      Continent Diet (see dc summary)  AMBULATORY STATUS COMMUNICATION OF NEEDS Skin   Extensive Assist Verbally Normal                       Personal Care Assistance Level of Assistance  Bathing, Feeding, Dressing Bathing Assistance: Limited assistance Feeding assistance: Independent Dressing Assistance: Limited assistance     Functional Limitations Info  Sight, Hearing, Speech Sight Info: Adequate Hearing Info: Adequate Speech Info: Adequate    SPECIAL CARE FACTORS FREQUENCY  PT (By licensed PT), OT (By licensed OT)     PT Frequency: 5x week OT Frequency: 3x week            Contractures      Additional Factors Info  Code Status, Allergies Code Status Info: [place] Allergies Info: Latex, Levaquin, Keflex, Lisinopril, Sulfa Antibiotics           Current Medications (02/02/2021):  This is the current hospital active medication list Current Facility-Administered Medications  Medication Dose Route Frequency Provider Last Rate Last Admin   acetaminophen (TYLENOL) tablet 650 mg  650 mg Oral Q6H PRN Adefeso, Oladapo, DO       Or   acetaminophen (  TYLENOL) suppository 650 mg  650 mg Rectal Q6H PRN Adefeso, Oladapo, DO       acetaminophen (TYLENOL) tablet 650 mg  650 mg Oral Q6H PRN Adefeso, Oladapo, DO   650 mg at 02/01/21 1104   Chlorhexidine Gluconate Cloth 2 % PADS 6 each  6 each Topical Daily Adefeso, Oladapo, DO   6 each at 02/01/21 1355   enoxaparin (LOVENOX) injection 80 mg  80 mg Subcutaneous QHS Manuella Ghazi, Pratik D, DO   80 mg at 02/01/21 2249   finasteride (PROSCAR) tablet 5 mg  5 mg Oral Daily Manuella Ghazi, Pratik D, DO   5 mg at 02/02/21 0933    fluticasone (FLONASE) 50 MCG/ACT nasal spray 1 spray  1 spray Each Nare Daily Manuella Ghazi, Pratik D, DO   1 spray at 0000000 [id number]   folic acid (FOLVITE) tablet 1 mg  1 mg Oral Daily Manuella Ghazi, Pratik D, DO   1 mg at 02/02/21 1011   furosemide (LASIX) tablet 20 mg  20 mg Oral Daily Manuella Ghazi, Pratik D, DO   20 mg at 02/02/21 0932   gabapentin (NEURONTIN) capsule 100 mg  100 mg Oral BID Adefeso, Oladapo, DO   100 mg at 02/02/21 0931   guaiFENesin-dextromethorphan (ROBITUSSIN DM) 100-10 MG/5ML syrup 5 mL  5 mL Oral Q4H PRN Adefeso, Oladapo, DO       HYDROcodone-acetaminophen (NORCO/VICODIN) 5-325 MG per tablet 1 tablet  1 tablet Oral Q4H PRN Manuella Ghazi, Pratik D, DO   1 tablet at 02/02/21 1010   influenza vac split quadrivalent PF (FLUARIX) injection 0.5 mL  0.5 mL Intramuscular Prior to discharge Manuella Ghazi, Cowley D, DO       ipratropium-albuterol (DUONEB) 0.5-2.5 (3) MG/3ML nebulizer solution 3 mL  3 mL Nebulization BID Manuella Ghazi, Pratik D, DO   3 mL at 02/02/21 [id number]   norepinephrine (LEVOPHED) '4mg'$  in 218m premix infusion  0-40 mcg/min Intravenous Titrated Adefeso, Oladapo, DO   Held at 01/31/21 1224   nystatin (MYCOSTATIN/NYSTOP) topical powder   Topical BID ABernadette Hoit DO   Given at 02/02/21 0937   ondansetron (ZOFRAN) injection 4 mg  4 mg Intravenous Q6H PRN Adefeso, Oladapo, DO       rifampin (RIFADIN) capsule 300 mg  300 mg Oral TID SManuella Ghazi Pratik D, DO       tamsulosin (FLOMAX) capsule 0.4 mg  0.4 mg Oral QHS Shah, Pratik D, DO       topiramate (TOPAMAX) tablet 100 mg  100 mg Oral Daily SManuella Ghazi Pratik D, DO   100 mg at 02/02/21 0932   traMADol (ULTRAM) tablet 50 mg  50 mg Oral Q8H PRN SManuella Ghazi Pratik D, DO       vancomycin (VANCOREADY) IVPB 2000 mg/400 mL  2,000 mg Intravenous Once SHeath LarkD, DO         Discharge Medications: Please see discharge summary for a list of discharge medications.  Relevant Imaging Results:  Relevant Lab Results:   Additional Information SSN: [id number]  [place]

## 2021-02-02 NOTE — Plan of Care (Signed)
  Problem: Acute Rehab OT Goals (only OT should resolve) Goal: Pt. Will Perform Grooming Flowsheets (Taken 02/02/2021 1454) Pt Will Perform Grooming:  with set-up  sitting  with adaptive equipment Goal: Pt. Will Perform Lower Body Dressing Flowsheets (Taken 02/02/2021 1454) Pt Will Perform Lower Body Dressing:  with min assist  with mod assist  sitting/lateral leans  with adaptive equipment Goal: Pt. Will Transfer To Toilet Flowsheets (Taken 02/02/2021 1454) Pt Will Transfer to Toilet:  with mod assist  stand pivot transfer  with max assist  bedside commode Goal: Pt/Caregiver Will Perform Home Exercise Program Flowsheets (Taken 02/02/2021 1454) Pt/caregiver will Perform Home Exercise Program:  Increased strength  Both right and left upper extremity  With Supervision  Kenzley Ke OT, MOT

## 2021-02-02 NOTE — TOC Initial Note (Signed)
Transition of Care [facility]) - Initial/Assessment Note    Patient Details  Name: Eddie Fletcher MRN: [id number] Date of Birth: 05/04/1956  [facility] CM/SW Contact:    Shade Flood, LCSW Phone Number: 02/02/2021, 2:17 PM  Clinical Narrative:                  Pt admitted from [facility]. He has been there for rehab and IV anbx. Pt known to TOC from previous admissions. At the time of pt's last dc from [facility] in August, he went to [employer] as his home was not safe for him to return. Pt's wife was attempting to provide care at the motel.   Per pt, he went to his PCP almost right after that dc and the PCP sent him to [facility]. Pt was placed at [facility] from there. Pt reporting at this time that he does not want to return to [facility] at dc. He requests referrals to SNFs in [place] and [place] counties. Explained to pt that referrals could be made to the facilities within the Epic hub and pt was asked if he had a specific facility in [place] he was interested in but he stated there was not.  Referrals sent out. Pt will need insurance auth prior to dc. Updated MD.  Expected Discharge Plan: Skilled Nursing Facility Barriers to Discharge: Insurance Authorization   Patient Goals and CMS Choice Patient states their goals for this hospitalization and ongoing recovery are:: new SNF CMS Medicare.gov Compare Post Acute Care list provided to:: Patient Choice offered to / list presented to : Patient  Expected Discharge Plan and Services Expected Discharge Plan: [place] In-house Referral: Clinical Social Work   Post Acute Care Choice: [place] Living arrangements for the past 2 months: [place]                                      Prior Living Arrangements/Services Living arrangements for the past 2 months: [place] Lives with:: Self, Facility Resident Patient language and need for  interpreter reviewed:: Yes Do you feel safe going back to the place where you live?: No   new snf  Need for Family Participation in Patient Care: Yes (Comment) Care giver support system in place?: Yes (comment)   Criminal Activity/Legal Involvement Pertinent to Current Situation/Hospitalization: No - Comment as needed  Activities of Daily Living Home Assistive Devices/Equipment: Wheelchair, Environmental consultant (specify type) ADL Screening (condition at time of admission) Patient's cognitive ability adequate to safely complete daily activities?: No Is the patient deaf or have difficulty hearing?: No Does the patient have difficulty seeing, even when wearing glasses/contacts?: No Does the patient have difficulty concentrating, remembering, or making decisions?: No Patient able to express need for assistance with ADLs?: No Does the patient have difficulty dressing or bathing?: Yes Independently performs ADLs?: No Communication: Independent Dressing (OT): Dependent Is this a change from baseline?: Change from baseline, expected to last >3 days Grooming: Dependent Is this a change from baseline?: Change from baseline, expected to last >3 days Feeding: Dependent Is this a change from baseline?: Change from baseline, expected to last >3 days Bathing: Dependent Is this a change from baseline?: Change from baseline, expected to last >3 days Toileting: Dependent Is this a change from baseline?: Change from baseline, expected to last >3days In/Out Bed: Dependent Is this a change from baseline?: Change  from baseline, expected to last >3 days Walks in Home: Dependent Is this a change from baseline?: Change from baseline, expected to last >3 days Does the patient have difficulty walking or climbing stairs?: Yes Weakness of Legs: Both Weakness of Arms/Hands: Left  Permission Sought/Granted Permission sought to share information with : Facility Art therapist granted to share information  with : Yes, Verbal Permission Granted     Permission granted to share info w AGENCY: snfs        Emotional Assessment   Attitude/Demeanor/Rapport: Engaged Affect (typically observed): Frustrated Orientation: : Oriented to Self, Oriented to Place, Oriented to  Time, Oriented to Situation Alcohol / Substance Use: Not Applicable Psych Involvement: No (comment)  Admission diagnosis:  Cellulitis of chest wall [L03.313] Febrile illness [R50.9] Patient Active Problem List   Diagnosis Date Noted   Cellulitis of chest wall 01/22/2021   Elevated brain natriuretic peptide (BNP) level 02/05/2021   Pulmonary edema 01/26/2021   Splenomegaly, not elsewhere classified 01/25/2021   Ascites 01/29/2021   Chronic venous stasis dermatitis of both lower extremities 01/23/2021   Chronic kidney disease 01/31/2021   Acute febrile illness 02/03/2021   Acute respiratory failure with hypoxia ([place]) 01/11/2021   Cellulitis of scrotum [id number]   Acute metabolic encephalopathy [id number]   Acute kidney injury superimposed on CKD ([place]) 12/17/2020   Acute lower UTI 12/17/2020   Morbid obesity with BMI of 60.0-69.9, adult ([place]) 12/17/2020   Multiple falls 12/17/2020   Chronic low back pain 12/12/2020   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/09/2015   Sepsis due to Staphylococcus aureus ([place]) 01/09/2015   Elevated LFTs    Acute renal failure ([place])    AKI (acute kidney injury) ([place]) 01/08/2015   Hypotension 01/08/2015   Septic joint of right knee joint ([place]) 01/08/2015   Septic joint of left knee joint ([place]) 01/03/2015   Besnier-Boeck disease 02/05/2014   Obstructive sleep apnea 02/05/2014   Avitaminosis D 02/05/2014   BP (high blood pressure) 02/05/2014   Colon polyp 02/05/2014   Osteoarthritis of right knee 02/05/2014   Total knee replacement status 01/30/2014   PCP:  Curly Rim, MD Pharmacy:   [id number] [place] [address]Phone: [phone]: [phone]    Social Determinants of Health (SDOH) Interventions    Readmission Risk Interventions No flowsheet data found.

## 2021-02-02 NOTE — Evaluation (Signed)
Occupational Therapy Evaluation Patient Details Name: Eddie Fletcher MRN: [id number] DOB: 09-28-1955 Today's Date: 02/02/2021   History of Present Illness Eddie Fletcher is a 65 y.o. male with medical history significant for hypertension, chronic back pain, multiple falls, sleep apnea, morbid obesity, BPH who presents to the emergency department from [facility] via EMS due to 1 day onset of fever and was also noted to be somewhat more somnolent.  He is being actively treated with IV vancomycin for cellulitis of the scrotum and legs and the concern was his febrile episode as well as some hypoxemia.   Clinical Impression   Pt lethargic and sleeping at start of session. Pt did not fully wake till attempting to sit up at EOB. Once at EOB pt was able to maintain sitting balance without physical assist. Pt demonstrates WFL A/ROM of B UE with generalized weakness. Pt required mod A for supin to sit mobility and min to mod A for sit to supine mobility. Nurse assisting this therapist for scooting pt to [facility] with pt actively pulling and pushing assisting with scooting. Pt O2 stayed above 90% for duration of session while donning 2 L O2. Pt will benefit from continued OT in the hospital and recommended venue below to increase strength, balance, and endurance for safe ADL's.        Recommendations for follow up therapy are one component of a multi-disciplinary discharge planning process, led by the attending physician.  Recommendations may be updated based on patient status, additional functional criteria and insurance authorization.   Follow Up Recommendations  SNF    Equipment Recommendations  None recommended by OT           Precautions / Restrictions Precautions Precautions: Fall Restrictions Weight Bearing Restrictions: No      Mobility Bed Mobility Overal bed mobility: Needs Assistance Bed Mobility: Supine to Sit;Sit to Supine     Supine to sit: Mod assist;HOB elevated Sit to supine:  Min assist;Mod assist   General bed mobility comments: Pt was able to sit at besdie with O3 dropping below 90%. Pt demonstrates slow labored movement.                          Balance Overall balance assessment: Needs assistance Sitting-balance support: No upper extremity supported;Feet supported Sitting balance-Leahy Scale: Fair Sitting balance - Comments: fair to good seated at EOB                                   ADL either performed or assessed with clinical judgement   ADL Overall ADL's : Needs assistance/impaired                                       General ADL Comments: Per pt difficulty with bed mobility pt likely requires assist for ADL's. Pt likely requires Max to total assist for lower body dressing and bathing. Pt demosntrates WFL A/ROM and likely requires s/u to min A for upper body mobility and bathing.     Vision Baseline Vision/History: 0 No visual deficits Ability to See in Adequate Light: 0 Adequate Patient Visual Report: No change from baseline                  Pertinent Vitals/Pain Pain Assessment: Faces Faces Pain Scale: Hurts little more  Pain Location: back Pain Descriptors / Indicators: Moaning;Grimacing Pain Intervention(s): Limited activity within patient's tolerance;Repositioned;Monitored during session     Hand Dominance     Extremity/Trunk Assessment Upper Extremity Assessment Upper Extremity Assessment: Generalized weakness   Lower Extremity Assessment Lower Extremity Assessment: Defer to PT evaluation       Communication Communication Communication: No difficulties   Cognition Arousal/Alertness: Lethargic Behavior During Therapy: WFL for tasks assessed/performed Overall Cognitive Status: Within Functional Limits for tasks assessed                                 General Comments: Pt was very lethargic initally but was able to wake and follow commands once sitting up at  EOB.                    Home Living Family/patient expects to be discharged to:: Skilled nursing facility                                        Prior Functioning/Environment Level of Independence: Independent with assistive device(s);Needs assistance        Comments: Per document review. Pt came from [place], likely assisted for ADL's and IADL's        OT Problem List: Decreased strength;Decreased activity tolerance;Impaired balance (sitting and/or standing);Obesity      OT Treatment/Interventions: Self-care/ADL training;Therapeutic exercise;Therapeutic activities;Balance training;Patient/family education    OT Goals(Current goals can be found in the care plan section) Acute Rehab OT Goals Patient Stated Goal: Pt open to rehab but not [facility]. OT Goal Formulation: With patient Time For Goal Achievement: 02/16/21 Potential to Achieve Goals: Fair  OT Frequency: Min 2X/week                     End of Session Equipment Utilized During Treatment: Oxygen (2L) Nurse Communication:  (Nurse assisted with pt scooting up to [facility] at end of session.)  Activity Tolerance: Patient tolerated treatment well Patient left: in bed;with call bell/phone within reach;with nursing/sitter in room  OT Visit Diagnosis: Unsteadiness on feet (R26.81);Muscle weakness (generalized) (M62.81)                Time: [id number] OT Time Calculation (min): 24 min Charges:  OT General Charges $OT Visit: 1 Visit OT Evaluation $OT Eval Moderate Complexity: 1 Mod  Neel Buffone OT, MOT  Larey Seat 02/02/2021, 2:52 PM

## 2021-02-02 NOTE — Progress Notes (Signed)
PROGRESS NOTE    Eddie Fletcher  [id number] DOB: 1955/05/13 DOA: 02/04/2021 PCP: Curly Rim, MD   Brief Narrative:   Eddie Fletcher is a 65 y.o. male with medical history significant for hypertension, chronic back pain, multiple falls, sleep apnea, morbid obesity, BPH who presents to the emergency department from [facility] via EMS due to 1 day onset of fever and was also noted to be somewhat more somnolent.  He is being actively treated with IV vancomycin for cellulitis of the scrotum and legs and the concern was his febrile episode as well as some hypoxemia.  Assessment & Plan:   Principal Problem:   Cellulitis of chest wall Active Problems:   Obstructive sleep apnea   Chronic low back pain   Morbid obesity with BMI of 60.0-69.9, adult (HCC)   Multiple falls   Elevated brain natriuretic peptide (BNP) level   Pulmonary edema   Splenomegaly, not elsewhere classified   Ascites   Chronic venous stasis dermatitis of both lower extremities   Chronic kidney disease   Acute febrile illness   Acute respiratory failure with hypoxia (HCC)   Cellulitis of scrotum   Acute fever with hypoxemia likely related to new pneumonia -Chest x-ray with possible infectious etiology and procalcitonin elevation noted -In the setting of current scrotal cellulitis, but does not appear to have significant extension to his chest wall -Continues currently on linezolid and cefepime and was on vancomycin outpatient for ongoing cellulitis treatment for total 6 weeks, changed back to vancomycin as well as rifampin as recommended for 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 for total 6 weeks which began on 9/7 -Started Augmentin for pneumonia for total 7-day course of treatment, currently day 3 -Patient has PICC line   Acute hypoxemic respiratory failure secondary to pulmonary edema -Started IV Lasix 9/25 due to improvement in blood pressure readings -Repeat 2D echocardiogram on 9/24  with LVEF 65-70% -Wean oxygen as tolerated   Splenomegaly and ascites -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 and moderate splenomegaly noted on CT scan -Plans for diuresis starting 9/25   AKI on CKD stage IV -Hold further diuretics -Gentle, time-limited normal saline -Baseline creatinine 1.8-2.5 -Continue to monitor   Chronic venous stasis dermatitis -Stable   OSA -Refuses CPAP   Chronic low back pain/multiple falls -Pain medications as needed -Fall precautions    Morbid obesity -Lifestyle changes outpatient     DVT prophylaxis:Lovenox Code Status: Full Family Communication: Wife on phone 9/25 Disposition Plan:  Status is: Inpatient   Remains inpatient appropriate because:IV treatments appropriate due to intensity of illness or inability to take PO and Inpatient level of care appropriate due to severity of illness   Dispo: The patient is from: SNF              Anticipated d/c is to: SNF at a new location              Patient currently is not medically stable to d/c.              Difficult to place patient No     Consultants:  None   Procedures:  See below  Antimicrobials:  Anti-infectives (From admission, onward)    Start     Dose/Rate Route Frequency Ordered Stop   02/02/21 1400  rifampin (RIFADIN) capsule 300 mg        300 mg Oral 3 times daily 02/02/21 1106     02/02/21 1000  rifampin (RIFADIN) capsule 450 mg  Status:  Discontinued        450 mg Oral Every 12 hours 02/02/21 0701 02/02/21 1106   02/02/21 0830  vancomycin (VANCOREADY) IVPB 2000 mg/400 mL        2,000 mg 200 mL/hr over 120 Minutes Intravenous  Once 02/02/21 0803     01/31/21 0330  ceFEPIme (MAXIPIME) 2 g in sodium chloride 0.9 % 100 mL IVPB  Status:  Discontinued        2 g 200 mL/hr over 30 Minutes Intravenous Every 12 hours 01/21/2021 2214 02/02/21 0701   01/31/21 0030  linezolid (ZYVOX) tablet 600 mg  Status:  Discontinued        600 mg Oral Every 12 hours 01/26/2021 2344  02/02/21 0701   01/09/2021 2330  vancomycin (VANCOREADY) IVPB 1500 mg/300 mL  Status:  Discontinued       See Hyperspace for full Linked Orders Report.   1,500 mg 150 mL/hr over 120 Minutes Intravenous  Once 01/09/2021 2148 01/14/2021 2151   01/08/2021 2230  vancomycin (VANCOCIN) IVPB 1000 mg/200 mL premix  Status:  Discontinued       See Hyperspace for full Linked Orders Report.   1,000 mg 200 mL/hr over 60 Minutes Intravenous  Once 02/03/2021 2148 01/31/2021 2151   01/29/2021 2145  vancomycin (VANCOCIN) IVPB 1000 mg/200 mL premix  Status:  Discontinued        1,000 mg 200 mL/hr over 60 Minutes Intravenous  Once 01/08/2021 2140 01/31/2021 2145   01/31/2021 2145  ceFEPIme (MAXIPIME) 2 g in sodium chloride 0.9 % 100 mL IVPB  Status:  Discontinued        2 g 200 mL/hr over 30 Minutes Intravenous  Once 01/19/2021 2140 01/20/2021 2212   01/21/2021 1830  aztreonam (AZACTAM) 2 g in sodium chloride 0.9 % 100 mL IVPB        2 g 200 mL/hr over 30 Minutes Intravenous  Once 01/14/2021 1821 02/04/2021 2034       Subjective: Patient seen and evaluated today with no new acute complaints or concerns. No acute concerns or events noted overnight.  Objective: Vitals:   02/01/21 1623 02/01/21 1955 02/02/21 0531 02/02/21 0713  BP: 118/61 (!) 99/54 (!) 98/57   Pulse: 83 79 78   Resp:  18 20   Temp: 98.4 F (36.9 C) 98.3 F (36.8 C) 98.9 F (37.2 C)   TempSrc: Oral Oral Oral   SpO2: 92% 98% 98% 99%  Weight:   (!) 178.8 kg   Height:   '5\' 6"'$  (1.676 m)     Intake/Output Summary (Last 24 hours) at 02/02/2021 1234 Last data filed at 02/02/2021 0900 Gross per 24 hour  Intake 1260 ml  Output 500 ml  Net 760 ml   Filed Weights   01/31/21 0512 02/01/21 0600 02/02/21 0531  Weight: (!) 176.2 kg (!) 179.7 kg (!) 178.8 kg    Examination:  General exam: Appears calm and comfortable, morbid obesity Respiratory system: Clear to auscultation. Respiratory effort normal. Cardiovascular system: S1 & S2 heard, RRR.  Gastrointestinal  system: Abdomen is soft Central nervous system: Alert and awake Extremities: Chronic venous skin changes Skin: No significant lesions noted Psychiatry: Flat affect.    Data Reviewed: I have personally reviewed following labs and imaging studies  CBC: Recent Labs  Lab 02/06/2021 1715 01/31/21 0327 02/01/21 0401 02/02/21 0455  WBC 11.4* 9.6 8.6 7.5  NEUTROABS 9.0*  --   --   --   HGB 8.9* 8.2* 8.4* 8.4*  HCT  28.8* 26.0* 26.9* 27.4*  MCV 93.2 93.2 94.1 92.6  PLT 114* 97* 90* 82*   Basic Metabolic Panel: Recent Labs  Lab 01/24/2021 1715 01/31/21 0327 02/01/21 0401 02/02/21 0455  NA 136 135 132* 133*  K 3.6 3.9 3.6 3.5  CL 104 103 101 102  CO2 '26 25 27 25  '$ GLUCOSE 111* 100* 155* 118*  BUN 22 24* 27* 31*  CREATININE 2.63* 2.73* 2.77* 3.08*  CALCIUM 7.6* 7.5* 7.2* 7.4*  MG  --  1.7 1.9 1.9  PHOS  --  3.8  --   --    GFR: Estimated Creatinine Clearance: 37.6 mL/min (A) (by C-G formula based on SCr of 3.08 mg/dL (H)). Liver Function Tests: Recent Labs  Lab 01/31/21 0327  AST 33  ALT 21  ALKPHOS 91  BILITOT 1.2  PROT 6.1*  ALBUMIN 1.8*   No results for input(s): LIPASE, AMYLASE in the last 168 hours. No results for input(s): AMMONIA in the last 168 hours. Coagulation Profile: Recent Labs  Lab 01/31/21 0327  INR 1.7*   Cardiac Enzymes: No results for input(s): CKTOTAL, CKMB, CKMBINDEX, TROPONINI in the last 168 hours. BNP (last 3 results) No results for input(s): PROBNP in the last 8760 hours. HbA1C: No results for input(s): HGBA1C in the last 72 hours. CBG: Recent Labs  Lab 01/31/21 0234  GLUCAP 89   Lipid Profile: No results for input(s): CHOL, HDL, LDLCALC, TRIG, CHOLHDL, LDLDIRECT in the last 72 hours. Thyroid Function Tests: No results for input(s): TSH, T4TOTAL, FREET4, T3FREE, THYROIDAB in the last 72 hours. Anemia Panel: No results for input(s): VITAMINB12, FOLATE, FERRITIN, TIBC, IRON, RETICCTPCT in the last 72 hours. Sepsis Labs: Recent  Labs  Lab 01/31/21 0327  PROCALCITON 33.06  LATICACIDVEN 1.7    Recent Results (from the past 240 hour(s))  Culture, blood (single) w Reflex to ID Panel     Status: None (Preliminary result)   Collection Time: 01/16/2021  5:15 PM   Specimen: BLOOD LEFT HAND  Result Value Ref Range Status   Specimen Description BLOOD LEFT HAND  Final   Special Requests   Final    BOTTLES DRAWN AEROBIC AND ANAEROBIC Blood Culture results may not be optimal due to an excessive volume of blood received in culture bottles   Culture   Final    NO GROWTH 3 DAYS Performed at [facility], [address] [place], [place] [address]    Report Status PENDING  Incomplete  Urine Culture     Status: None   Collection Time: 01/19/2021  5:40 PM   Specimen: In/Out Cath Urine  Result Value Ref Range Status   Specimen Description   Final    IN/OUT CATH URINE Performed at [facility], [address], [place], [place] [address]    Special Requests   Final    NONE Performed at [facility], [address] [place], [place] [address]    Culture   Final    NO GROWTH Performed at [facility], [place] [address], [place], [place] [address]    Report Status 02/01/2021 FINAL  Final  Resp Panel by RT-PCR (Flu A&B, Covid) Nasopharyngeal Swab     Status: None   Collection Time: 01/22/2021  5:45 PM   Specimen: Nasopharyngeal Swab; Nasopharyngeal(NP) swabs in vial transport medium  Result Value Ref Range Status   SARS Coronavirus 2 by RT PCR NEGATIVE NEGATIVE Final    Comment: (NOTE) SARS-CoV-2 target nucleic acids are NOT DETECTED.  The SARS-CoV-2 RNA  is generally detectable in upper respiratory specimens during the acute phase of infection. The lowest concentration of SARS-CoV-2 viral copies this assay can detect is 138 copies/mL. A negative result does not preclude SARS-Cov-2 infection and should not be used as the sole basis for treatment or other patient management decisions. A negative result may occur with   improper specimen collection/handling, submission of specimen other than nasopharyngeal swab, presence of viral mutation(s) within the areas targeted by this assay, and inadequate number of viral copies(<138 copies/mL). A negative result must be combined with clinical observations, patient history, and epidemiological information. The expected result is Negative.  Fact Sheet for Patients:  EntrepreneurPulse.com.au  Fact Sheet for Healthcare Providers:  IncredibleEmployment.be  This test is no t yet approved or cleared by the [place] FDA and  has been authorized for detection and/or diagnosis of SARS-CoV-2 by FDA under an Emergency Use Authorization (EUA). This EUA will remain  in effect (meaning this test can be used) for the duration of the COVID-19 declaration under Section 564(b)(1) of the Act, 21 U.S.C.section 360bbb-3(b)(1), unless the authorization is terminated  or revoked sooner.       Influenza A by PCR NEGATIVE NEGATIVE Final   Influenza B by PCR NEGATIVE NEGATIVE Final    Comment: (NOTE) The Xpert Xpress SARS-CoV-2/FLU/RSV plus assay is intended as an aid in the diagnosis of influenza from Nasopharyngeal swab specimens and should not be used as a sole basis for treatment. Nasal washings and aspirates are unacceptable for Xpert Xpress SARS-CoV-2/FLU/RSV testing.  Fact Sheet for Patients: EntrepreneurPulse.com.au  Fact Sheet for Healthcare Providers: IncredibleEmployment.be  This test is not yet approved or cleared by the [place] FDA and has been authorized for detection and/or diagnosis of SARS-CoV-2 by FDA under an Emergency Use Authorization (EUA). This EUA will remain in effect (meaning this test can be used) for the duration of the COVID-19 declaration under Section 564(b)(1) of the Act, 21 U.S.C. section 360bbb-3(b)(1), unless the authorization is terminated  or revoked.  Performed at [facility], [address] [place], [place] [address]   MRSA Next Gen by PCR, Nasal     Status: None   Collection Time: 01/23/2021 11:44 PM   Specimen: Nasal Mucosa; Nasal Swab  Result Value Ref Range Status   MRSA by PCR Next Gen NOT DETECTED NOT DETECTED Final    Comment: (NOTE) The GeneXpert MRSA Assay (FDA approved for NASAL specimens only), is one component of a comprehensive MRSA colonization surveillance program. It is not intended to diagnose MRSA infection nor to guide or monitor treatment for MRSA infections. Test performance is not FDA approved in patients less than 80 years old. Performed at [facility], [address], [place], [place] [address]          Radiology Studies: No results found.      Scheduled Meds:  Chlorhexidine Gluconate Cloth  6 each Topical Daily   enoxaparin (LOVENOX) injection  80 mg Subcutaneous QHS   finasteride  5 mg Oral Daily   fluticasone  1 spray Each Nare Daily   folic acid  1 mg Oral Daily   gabapentin  100 mg Oral BID   ipratropium-albuterol  3 mL Nebulization BID   nystatin   Topical BID   rifampin  300 mg Oral TID   tamsulosin  0.4 mg Oral QHS   topiramate  100 mg Oral Daily   Continuous Infusions:  vancomycin 2,000 mg (02/02/21 1226)     LOS: 3 days  Time spent: 35 minutes    Yalda Herd Darleen Crocker, DO [facility]  If 7PM-7AM, please contact night-coverage [email or web address] 02/02/2021, 12:34 PM

## 2021-02-03 DIAGNOSIS — L03313 Cellulitis of chest wall: Secondary | ICD-10-CM | POA: Diagnosis not present

## 2021-02-03 LAB — CBC
HCT: 27.6 % — ABNORMAL LOW (ref 39.0–52.0)
Hemoglobin: 8.7 g/dL — ABNORMAL LOW (ref 13.0–17.0)
MCH: 29.7 pg (ref 26.0–34.0)
MCHC: 31.5 g/dL (ref 30.0–36.0)
MCV: 94.2 fL (ref 80.0–100.0)
Platelets: 71 10*3/uL — ABNORMAL LOW (ref 150–400)
RBC: 2.93 MIL/uL — ABNORMAL LOW (ref 4.22–5.81)
RDW: 16.2 % — ABNORMAL HIGH (ref 11.5–15.5)
WBC: 5.6 10*3/uL (ref 4.0–10.5)
nRBC: 0 % (ref 0.0–0.2)

## 2021-02-03 LAB — BASIC METABOLIC PANEL
Anion gap: 7 (ref 5–15)
BUN: 32 mg/dL — ABNORMAL HIGH (ref 8–23)
CO2: 25 mmol/L (ref 22–32)
Calcium: 7.4 mg/dL — ABNORMAL LOW (ref 8.9–10.3)
Chloride: 102 mmol/L (ref 98–111)
Creatinine, Ser: 3.06 mg/dL — ABNORMAL HIGH (ref 0.61–1.24)
GFR, Estimated: 22 mL/min — ABNORMAL LOW (ref >60)
Glucose, Bld: 111 mg/dL — ABNORMAL HIGH (ref 70–99)
Potassium: 3.5 mmol/L (ref 3.5–5.1)
Sodium: 134 mmol/L — ABNORMAL LOW (ref 135–145)

## 2021-02-03 LAB — MAGNESIUM: Magnesium: 2.1 mg/dL (ref 1.7–2.4)

## 2021-02-03 LAB — VANCOMYCIN, RANDOM: Vancomycin Rm: 20

## 2021-02-03 LAB — CK: Total CK: 32 U/L — ABNORMAL LOW (ref 49–397)

## 2021-02-03 MED ORDER — SODIUM CHLORIDE 0.9% FLUSH
3.0000 mL | Freq: Three times a day (TID) | INTRAVENOUS | Status: DC
  Administered 2021-02-03 – 2021-02-20 (×41): 3 mL via INTRAVENOUS

## 2021-02-03 MED ORDER — SODIUM CHLORIDE 0.9 % IV SOLN
900.0000 mg | Freq: Every day | INTRAVENOUS | Status: DC
  Administered 2021-02-03 – 2021-02-10 (×8): 900 mg via INTRAVENOUS
  Filled 2021-02-03 (×9): qty 18

## 2021-02-03 MED ORDER — MELATONIN 3 MG PO TABS
3.0000 mg | ORAL_TABLET | Freq: Every evening | ORAL | Status: DC | PRN
  Administered 2021-02-13 (×2): 3 mg via ORAL
  Filled 2021-02-03: qty 1

## 2021-02-03 NOTE — Progress Notes (Signed)
PROGRESS NOTE    Eddie Fletcher  [id number] DOB: November 21, 1955 DOA: 01/31/2021 PCP: Curly Rim, MD   Brief Narrative:   Eddie Fletcher is a 65 y.o. male with medical history significant for hypertension, chronic back pain, multiple falls, sleep apnea, morbid obesity, BPH who presents to the emergency department from [facility] via EMS due to 1 day onset of fever and was also noted to be somewhat more somnolent.  He is being actively treated with IV vancomycin for cellulitis of the scrotum and legs and the concern was his febrile episode as well as some hypoxemia which is related to pneumonia.  He is actively being treated with antibiotics as noted below and is awaiting placement to new SNF if possible.  Insurance authorization pending as well.  Assessment & Plan:   Principal Problem:   Cellulitis of chest wall Active Problems:   Obstructive sleep apnea   Chronic low back pain   Morbid obesity with BMI of 60.0-69.9, adult (HCC)   Multiple falls   Elevated brain natriuretic peptide (BNP) level   Pulmonary edema   Splenomegaly, not elsewhere classified   Ascites   Chronic venous stasis dermatitis of both lower extremities   Chronic kidney disease   Acute febrile illness   Acute respiratory failure with hypoxia (HCC)   Cellulitis of scrotum   Acute fever with hypoxemia likely related to new pneumonia-improved -In the setting of current scrotal cellulitis with right knee prosthetic infection -Continues currently on vancomycin as well as rifampin as recommended for 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 for total 6 weeks which began on 9/7; being followed by ID Dr. 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 and moderate splenomegaly noted on CT scan -Continue to monitor and hold diuretics for now   AKI on CKD stage IV -Hold further diuretics -Discontinue further IV fluid and monitor creatinine trend -Baseline creatinine 1.8-2.5 -Continue to monitor  Mild hyponatremia -Currently stable, continue to monitor -Consider further testing as needed   Chronic venous stasis dermatitis -Stable   OSA -Refuses CPAP   Chronic low back pain/multiple falls -Pain medications as needed -Fall precautions   Thrombocytopenia -Likely related to splenomegaly -Hold further Lovenox and maintain on SCDs -Monitor repeat CBC to trend   Morbid obesity -Lifestyle changes outpatient     DVT prophylaxis:Lovenox to SCDs today due to thrombocytopenia Code Status: Full Family Communication: Wife on phone 9/25 Disposition Plan:  Status is: Inpatient   Remains inpatient appropriate because:IV treatments appropriate due to intensity of illness or inability to take PO and Inpatient level of care appropriate due to severity of illness   Dispo: The patient is from: SNF              Anticipated d/c is to: SNF at a new location              Patient currently is not medically stable to d/c.  Difficult to place patient No     Consultants:  None   Procedures:  See below  Antimicrobials:  Anti-infectives (From admission, onward)    Start     Dose/Rate Route Frequency Ordered Stop   02/02/21 1410  vancomycin variable dose per unstable renal function (pharmacist dosing)         Does not apply See admin instructions 02/02/21 1410     02/02/21 1400  rifampin (RIFADIN) capsule  300 mg        300 mg Oral 3 times daily 02/02/21 1106     02/02/21 1330  amoxicillin-clavulanate (AUGMENTIN) 875-125 MG per tablet 1 tablet        1 tablet Oral Every 12 hours 02/02/21 1238     02/02/21 1000  rifampin (RIFADIN) capsule 450 mg  Status:  Discontinued        450 mg Oral Every 12 hours 02/02/21 0701 02/02/21 1106   02/02/21 0830  vancomycin (VANCOREADY) IVPB 2000 mg/400 mL        2,000 mg 200 mL/hr over 120 Minutes Intravenous  Once 02/02/21 0803 02/02/21 1547   01/31/21 0330  ceFEPIme (MAXIPIME) 2 g in sodium chloride 0.9 % 100 mL IVPB  Status:  Discontinued        2 g 200 mL/hr over 30 Minutes Intravenous Every 12 hours 01/29/2021 2214 02/02/21 0701   01/31/21 0030  linezolid (ZYVOX) tablet 600 mg  Status:  Discontinued        600 mg Oral Every 12 hours 02/04/2021 2344 02/02/21 0701   01/28/2021 2330  vancomycin (VANCOREADY) IVPB 1500 mg/300 mL  Status:  Discontinued       See Hyperspace for full Linked Orders Report.   1,500 mg 150 mL/hr over 120 Minutes Intravenous  Once 01/27/2021 2148 01/18/2021 2151   01/11/2021 2230  vancomycin (VANCOCIN) IVPB 1000 mg/200 mL premix  Status:  Discontinued       See Hyperspace for full Linked Orders Report.   1,000 mg 200 mL/hr over 60 Minutes Intravenous  Once 01/23/2021 2148 01/26/2021 2151   01/22/2021 2145  vancomycin (VANCOCIN) IVPB 1000 mg/200 mL premix  Status:  Discontinued        1,000 mg 200 mL/hr over 60 Minutes Intravenous  Once 02/01/2021 2140 01/20/2021 2145   01/12/2021 2145  ceFEPIme (MAXIPIME) 2 g in sodium chloride 0.9 % 100 mL IVPB  Status:  Discontinued        2 g 200 mL/hr over 30 Minutes Intravenous  Once 01/20/2021 2140 01/16/2021 2212   01/16/2021 1830  aztreonam (AZACTAM) 2 g in sodium chloride 0.9 % 100 mL IVPB        2 g 200 mL/hr over 30 Minutes Intravenous  Once 01/31/2021 1821 01/29/2021 2034       Subjective: Patient seen and evaluated today with no new acute complaints or concerns. No acute concerns or events noted  overnight.  Objective: Vitals:   02/02/21 2016 02/03/21 0543 02/03/21 0713 02/03/21 0828  BP: (!) 107/59 106/63  (!) 121/58  Pulse: 84 84  86  Resp: '20 20  20  '$ Temp: 98.8 F (37.1 C) 97.8 F (36.6 C)  98.9 F (37.2 C)  TempSrc: Oral Oral  Oral  SpO2: 92% 94%  91%  Weight:   109.3 kg   Height:        Intake/Output Summary (Last 24 hours) at 02/03/2021 1018 Last data filed at 02/03/2021 0900 Gross per 24 hour  Intake 1063.16 ml  Output 1000 ml  Net 63.16 ml   Filed Weights   02/01/21 0600 02/02/21 0531 02/03/21 0713  Weight: (!) 179.7 kg (!) 178.8 kg 109.3 kg    Examination:  General exam: Appears calm and comfortable, morbidly obese Respiratory system: Clear to auscultation. Respiratory effort normal.  Currently on room air. Cardiovascular system: S1 & S2 heard, RRR.  Gastrointestinal system: Abdomen is soft Central nervous system: Alert and awake Extremities: Chronic lower extremity changes Skin: No significant lesions noted Psychiatry: Flat affect.    Data Reviewed: I have personally reviewed following labs and imaging studies  CBC: Recent Labs  Lab 01/22/2021 1715 01/31/21 0327 02/01/21 0401 02/02/21 0455 02/03/21 0536  WBC 11.4* 9.6 8.6 7.5 5.6  NEUTROABS 9.0*  --   --   --   --   HGB 8.9* 8.2* 8.4* 8.4* 8.7*  HCT 28.8* 26.0* 26.9* 27.4* 27.6*  MCV 93.2 93.2 94.1 92.6 94.2  PLT 114* 97* 90* 82* 71*   Basic Metabolic Panel: Recent Labs  Lab 01/15/2021 1715 01/31/21 0327 02/01/21 0401 02/02/21 0455 02/03/21 0536  NA 136 135 132* 133* 134*  K 3.6 3.9 3.6 3.5 3.5  CL 104 103 101 102 102  CO2 '26 25 27 25 25  '$ GLUCOSE 111* 100* 155* 118* 111*  BUN 22 24* 27* 31* 32*  CREATININE 2.63* 2.73* 2.77* 3.08* 3.06*  CALCIUM 7.6* 7.5* 7.2* 7.4* 7.4*  MG  --  1.7 1.9 1.9 2.1  PHOS  --  3.8  --   --   --    GFR: Estimated Creatinine Clearance: 28.3 mL/min (A) (by C-G formula based on SCr of 3.06 mg/dL (H)). Liver Function Tests: Recent Labs  Lab  01/31/21 0327  AST 33  ALT 21  ALKPHOS 91  BILITOT 1.2  PROT 6.1*  ALBUMIN 1.8*   No results for input(s): LIPASE, AMYLASE in the last 168 hours. No results for input(s): AMMONIA in the last 168 hours. Coagulation Profile: Recent Labs  Lab 01/31/21 0327  INR 1.7*   Cardiac Enzymes: Recent Labs  Lab 02/03/21 0536  CKTOTAL 32*   BNP (last 3 results) No results for input(s): PROBNP in the last 8760 hours. HbA1C: No results for input(s): HGBA1C in the last 72 hours. CBG: Recent Labs  Lab 01/31/21 0234  GLUCAP 89   Lipid Profile: No results for input(s): CHOL, HDL, LDLCALC, TRIG, CHOLHDL, LDLDIRECT in the last 72 hours. Thyroid Function Tests: No results for input(s): TSH, T4TOTAL, FREET4, T3FREE, THYROIDAB in the last 72 hours. Anemia Panel: No results for input(s): VITAMINB12, FOLATE, FERRITIN, TIBC, IRON, RETICCTPCT in the last 72 hours. Sepsis Labs: Recent Labs  Lab 01/31/21 0327  PROCALCITON 33.06  LATICACIDVEN 1.7    Recent Results (from the past 240 hour(s))  Culture, blood (single) w Reflex to ID Panel     Status: None (Preliminary result)   Collection Time: 01/25/2021  5:15 PM   Specimen: BLOOD LEFT HAND  Result Value Ref Range Status   Specimen Description BLOOD LEFT HAND  Final   Special Requests   Final    BOTTLES DRAWN AEROBIC AND ANAEROBIC Blood Culture results may not be optimal due to an excessive volume of blood received in culture bottles   Culture   Final    NO GROWTH 4 DAYS Performed at [facility], [address], [place], [place] [address]    Report Status PENDING  Incomplete  Urine Culture     Status: None   Collection Time: 02/03/2021  5:40 PM  Specimen: In/Out Cath Urine  Result Value Ref Range Status   Specimen Description   Final    IN/OUT CATH URINE Performed at [facility], [address] [place], [place] [address]    Special Requests   Final    NONE Performed at [facility], [address], [place], [place] [address]     Culture   Final    NO GROWTH Performed at [facility], [place] [address] [place], [place] [address]    Report Status 02/01/2021 FINAL  Final  Resp Panel by RT-PCR (Flu A&B, Covid) Nasopharyngeal Swab     Status: None   Collection Time: 01/08/2021  5:45 PM   Specimen: Nasopharyngeal Swab; Nasopharyngeal(NP) swabs in vial transport medium  Result Value Ref Range Status   SARS Coronavirus 2 by RT PCR NEGATIVE NEGATIVE Final    Comment: (NOTE) SARS-CoV-2 target nucleic acids are NOT DETECTED.  The SARS-CoV-2 RNA is generally detectable in upper respiratory specimens during the acute phase of infection. The lowest concentration of SARS-CoV-2 viral copies this assay can detect is 138 copies/mL. A negative result does not preclude SARS-Cov-2 infection and should not be used as the sole basis for treatment or other patient management decisions. A negative result may occur with  improper specimen collection/handling, submission of specimen other than nasopharyngeal swab, presence of viral mutation(s) within the areas targeted by this assay, and inadequate number of viral copies(<138 copies/mL). A negative result must be combined with clinical observations, patient history, and epidemiological information. The expected result is Negative.  Fact Sheet for Patients:  EntrepreneurPulse.com.au  Fact Sheet for Healthcare Providers:  IncredibleEmployment.be  This test is no t yet approved or cleared by the [place] FDA and  has been authorized for detection and/or diagnosis of SARS-CoV-2 by FDA under an Emergency Use Authorization (EUA). This EUA will remain  in effect (meaning this test can be used) for the duration of the COVID-19 declaration under Section 564(b)(1) of the Act, 21 U.S.C.section 360bbb-3(b)(1), unless the authorization is terminated  or revoked sooner.       Influenza A by PCR NEGATIVE NEGATIVE Final   Influenza B by PCR NEGATIVE  NEGATIVE Final    Comment: (NOTE) The Xpert Xpress SARS-CoV-2/FLU/RSV plus assay is intended as an aid in the diagnosis of influenza from Nasopharyngeal swab specimens and should not be used as a sole basis for treatment. Nasal washings and aspirates are unacceptable for Xpert Xpress SARS-CoV-2/FLU/RSV testing.  Fact Sheet for Patients: EntrepreneurPulse.com.au  Fact Sheet for Healthcare Providers: IncredibleEmployment.be  This test is not yet approved or cleared by the [place] FDA and has been authorized for detection and/or diagnosis of SARS-CoV-2 by FDA under an Emergency Use Authorization (EUA). This EUA will remain in effect (meaning this test can be used) for the duration of the COVID-19 declaration under Section 564(b)(1) of the Act, 21 U.S.C. section 360bbb-3(b)(1), unless the authorization is terminated or revoked.  Performed at [facility], [address] [place], [place] [address]   MRSA Next Gen by PCR, Nasal     Status: None   Collection Time: 01/26/2021 11:44 PM   Specimen: Nasal Mucosa; Nasal Swab  Result Value Ref Range Status   MRSA by PCR Next Gen NOT DETECTED NOT DETECTED Final    Comment: (NOTE) The GeneXpert MRSA Assay (FDA approved for NASAL specimens only), is one component of a comprehensive MRSA colonization surveillance program. It is not intended to diagnose MRSA infection nor to guide or monitor treatment  for MRSA infections. Test performance is not FDA approved in patients less than 38 years old. Performed at [facility], [address], [place],  [address]          Radiology Studies: No results found.      Scheduled Meds:  amoxicillin-clavulanate  1 tablet Oral Q12H   Chlorhexidine Gluconate Cloth  6 each Topical Daily   finasteride  5 mg Oral Daily   fluticasone  1 spray Each Nare Daily   folic acid  1 mg Oral Daily   gabapentin  100 mg Oral BID   nystatin   Topical BID   rifampin   300 mg Oral TID   tamsulosin  0.4 mg Oral QHS   topiramate  100 mg Oral Daily   vancomycin variable dose per unstable renal function (pharmacist dosing)   Does not apply See admin instructions     LOS: 4 days    Time spent: 35 minutes    Jakeem Grape Darleen Crocker, DO [facility]  If 7PM-7AM, please contact night-coverage [email or web address] 02/03/2021, 10:18 AM

## 2021-02-03 NOTE — TOC Progression Note (Signed)
Transition of Care [facility]) - Progression Note    Patient Details  Name: Eddie Fletcher MRN: [id number] Date of Birth: Aug 14, 1955  Transition of Care [facility]) CM/SW Contact  Eddie Flood, LCSW Phone Number: 02/03/2021, 1:24 PM  Clinical Narrative:     TOC following. Pt updated on bed offer from [place] and he accepts. Updated Eddie Fletcher in admissions at [facility]. Inquired if they can accept pt on Daptomycin and Eddie Fletcher will check with DON and let TOC know. SNF will start auth. Once auth obtained, pt can dc.  TOC will follow.  Expected Discharge Plan: Skilled Nursing Facility Barriers to Discharge: Ship broker  Expected Discharge Plan and Services Expected Discharge Plan: [place] In-house Referral: Clinical Social Work   Post Acute Care Choice: [place] Living arrangements for the past 2 months: [place]                                       Social Determinants of Health (SDOH) Interventions    Readmission Risk Interventions No flowsheet data found.

## 2021-02-04 DIAGNOSIS — L03313 Cellulitis of chest wall: Secondary | ICD-10-CM | POA: Diagnosis not present

## 2021-02-04 LAB — BASIC METABOLIC PANEL
Anion gap: 8 (ref 5–15)
BUN: 31 mg/dL — ABNORMAL HIGH (ref 8–23)
CO2: 23 mmol/L (ref 22–32)
Calcium: 7.7 mg/dL — ABNORMAL LOW (ref 8.9–10.3)
Chloride: 103 mmol/L (ref 98–111)
Creatinine, Ser: 2.71 mg/dL — ABNORMAL HIGH (ref 0.61–1.24)
GFR, Estimated: 25 mL/min — ABNORMAL LOW (ref >60)
Glucose, Bld: 130 mg/dL — ABNORMAL HIGH (ref 70–99)
Potassium: 3.5 mmol/L (ref 3.5–5.1)
Sodium: 134 mmol/L — ABNORMAL LOW (ref 135–145)

## 2021-02-04 LAB — CBC
HCT: 30.3 % — ABNORMAL LOW (ref 39.0–52.0)
Hemoglobin: 9.2 g/dL — ABNORMAL LOW (ref 13.0–17.0)
MCH: 27.9 pg (ref 26.0–34.0)
MCHC: 30.4 g/dL (ref 30.0–36.0)
MCV: 91.8 fL (ref 80.0–100.0)
Platelets: 87 10*3/uL — ABNORMAL LOW (ref 150–400)
RBC: 3.3 MIL/uL — ABNORMAL LOW (ref 4.22–5.81)
RDW: 16 % — ABNORMAL HIGH (ref 11.5–15.5)
WBC: 7.3 10*3/uL (ref 4.0–10.5)
nRBC: 0 % (ref 0.0–0.2)

## 2021-02-04 LAB — CULTURE, BLOOD (SINGLE): Culture: NO GROWTH

## 2021-02-04 LAB — MAGNESIUM: Magnesium: 2.1 mg/dL (ref 1.7–2.4)

## 2021-02-04 MED ORDER — SACCHAROMYCES BOULARDII 250 MG PO CAPS
250.0000 mg | ORAL_CAPSULE | Freq: Two times a day (BID) | ORAL | Status: DC
  Administered 2021-02-04 – 2021-02-14 (×20): 250 mg via ORAL
  Filled 2021-02-04 (×21): qty 1

## 2021-02-04 NOTE — Progress Notes (Signed)
PROGRESS NOTE    Eddie Fletcher  [id number] DOB: 09-27-1955 DOA: 01/29/2021 PCP: Curly Rim, MD   Brief Narrative:   Eddie Fletcher is a 65 y.o. male with medical history significant for hypertension, chronic back pain, multiple falls, sleep apnea, morbid obesity, BPH who presents to the emergency department from [facility] via EMS due to 1 day onset of fever and was also noted to be somewhat more somnolent.  He is being actively treated with IV vancomycin for cellulitis of the scrotum and legs and the concern was his febrile episode as well as some hypoxemia which is related to pneumonia.  He is actively being treated with antibiotics as noted below and is awaiting placement to new SNF if possible.  Insurance authorization pending as well.  Assessment & Plan:   Principal Problem:   Cellulitis of chest wall Active Problems:   Obstructive sleep apnea   Chronic low back pain   Morbid obesity with BMI of 60.0-69.9, adult (HCC)   Multiple falls   Elevated brain natriuretic peptide (BNP) level   Pulmonary edema   Splenomegaly, not elsewhere classified   Ascites   Chronic venous stasis dermatitis of both lower extremities   Chronic kidney disease   Acute febrile illness   Acute respiratory failure with hypoxia (HCC)   Cellulitis of scrotum   Acute fever with hypoxemia likely related to new pneumonia-improved -In the setting of current scrotal cellulitis with right knee prosthetic infection -Continues currently on vancomycin as well as rifampin as recommended for 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 for total 6 weeks which began on 9/7; being followed by ID Dr. Layne Benton at [facility] for pneumonia for total 7-day course of treatment, currently day 5 -Patient has PICC line   Acute hypoxemic respiratory failure secondary to pulmonary edema-resolved -Not currently hypoxemic or tachypneic and edema appears to be near baseline -Started IV Lasix  9/25 due to improvement in blood pressure readings, but no further need for diuretics noted -Continue to monitor carefully in light of creatinine elevation -Repeat 2D echocardiogram on 9/24 with LVEF 65-70%   Splenomegaly and ascites -Appears stable and near baseline -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 and moderate splenomegaly noted on CT scan -Continue to monitor and hold diuretics for now   AKI on CKD stage IV -Hold further diuretics -Discontinue further IV fluid and monitor creatinine trend -Baseline creatinine 1.8-2.5 -Continue to monitor  Mild hyponatremia -Currently stable, continue to monitor -Consider further testing as needed   Chronic venous stasis dermatitis -Stable   OSA -Refuses CPAP   Chronic low back pain/multiple falls -Pain medications as needed -Fall precautions   Thrombocytopenia -Likely related to splenomegaly -Hold further Lovenox and maintain on SCDs -Monitor repeat CBC to trend   Morbid obesity -Lifestyle changes outpatient     DVT prophylaxis:SCDs Code Status: Full Family Communication: Wife on phone 9/25 Disposition Plan: awaiting for insurance authorization for SNF placement Status is: Inpatient   Remains inpatient appropriate because:IV treatments appropriate due to intensity of illness or inability to take PO and Inpatient level of care appropriate due to severity of illness   Dispo: The patient is from: SNF              Anticipated d/c is to: SNF               Patient currently is medically stable to d/c.              Difficult to place  patient No     Consultants:  None   Procedures:  See below  Antimicrobials:  Anti-infectives (From admission, onward)    Start     Dose/Rate Route Frequency Ordered Stop   02/03/21 2000  DAPTOmycin (CUBICIN) 900 mg in sodium chloride 0.9 % IVPB        900 mg 136 mL/hr over 30 Minutes Intravenous Daily 02/03/21 1516     02/02/21 1410  vancomycin variable dose per unstable  renal function (pharmacist dosing)  Status:  Discontinued         Does not apply See admin instructions 02/02/21 1410 02/03/21 1516   02/02/21 1400  rifampin (RIFADIN) capsule 300 mg        300 mg Oral 3 times daily 02/02/21 1106     02/02/21 1330  amoxicillin-clavulanate (AUGMENTIN) 875-125 MG per tablet 1 tablet        1 tablet Oral Every 12 hours 02/02/21 1238     02/02/21 1000  rifampin (RIFADIN) capsule 450 mg  Status:  Discontinued        450 mg Oral Every 12 hours 02/02/21 0701 02/02/21 1106   02/02/21 0830  vancomycin (VANCOREADY) IVPB 2000 mg/400 mL        2,000 mg 200 mL/hr over 120 Minutes Intravenous  Once 02/02/21 0803 02/02/21 1547   01/31/21 0330  ceFEPIme (MAXIPIME) 2 g in sodium chloride 0.9 % 100 mL IVPB  Status:  Discontinued        2 g 200 mL/hr over 30 Minutes Intravenous Every 12 hours 01/10/2021 2214 02/02/21 0701   01/31/21 0030  linezolid (ZYVOX) tablet 600 mg  Status:  Discontinued        600 mg Oral Every 12 hours 01/16/2021 2344 02/02/21 0701   01/13/2021 2330  vancomycin (VANCOREADY) IVPB 1500 mg/300 mL  Status:  Discontinued       See Hyperspace for full Linked Orders Report.   1,500 mg 150 mL/hr over 120 Minutes Intravenous  Once 01/27/2021 2148 01/31/2021 2151   02/04/2021 2230  vancomycin (VANCOCIN) IVPB 1000 mg/200 mL premix  Status:  Discontinued       See Hyperspace for full Linked Orders Report.   1,000 mg 200 mL/hr over 60 Minutes Intravenous  Once 01/31/2021 2148 01/12/2021 2151   01/11/2021 2145  vancomycin (VANCOCIN) IVPB 1000 mg/200 mL premix  Status:  Discontinued        1,000 mg 200 mL/hr over 60 Minutes Intravenous  Once 01/11/2021 2140 01/12/2021 2145   01/23/2021 2145  ceFEPIme (MAXIPIME) 2 g in sodium chloride 0.9 % 100 mL IVPB  Status:  Discontinued        2 g 200 mL/hr over 30 Minutes Intravenous  Once 02/04/2021 2140 02/06/2021 2212   01/21/2021 1830  aztreonam (AZACTAM) 2 g in sodium chloride 0.9 % 100 mL IVPB        2 g 200 mL/hr over 30 Minutes Intravenous   Once 01/28/2021 1821 01/23/2021 2034       Subjective: He reports that scrotal edema might be a little improved today.   Objective: Vitals:   02/03/21 1400 02/03/21 2111 02/04/21 0535 02/04/21 0700  BP:  120/60 (!) 88/74 (!) 109/51  Pulse:  85 88   Resp:  19 19   Temp:  98.6 F (37 C) 98.7 F (37.1 C)   TempSrc:  Oral Oral   SpO2:  92% 96%   Weight: (!) 163.9 kg     Height:  Intake/Output Summary (Last 24 hours) at 02/04/2021 1124 Last data filed at 02/04/2021 0838 Gross per 24 hour  Intake 788 ml  Output 1150 ml  Net -362 ml   Filed Weights   02/02/21 0531 02/03/21 0713 02/03/21 1400  Weight: (!) 178.8 kg 109.3 kg (!) 163.9 kg   Examination:  General exam: NAD. Cooperative.  Appears calm and comfortable, morbidly obese Respiratory system: Clear to auscultation. Respiratory effort normal.  Currently on room air. Cardiovascular system: S1 & S2 heard, RRR.  Gastrointestinal system: Abdomen is obese, normal BS, no HSM, soft GU: edematous scrotum, erythematous skin, no weeping seen.  Central nervous system: Alert and awake Extremities: Chronic venous stasis changes, with scattered erythema. Scarring of right knee. Skin: No significant lesions noted Psychiatry: normal affect.   Data Reviewed: I have personally reviewed following labs and imaging studies  CBC: Recent Labs  Lab 01/12/2021 1715 01/31/21 0327 02/01/21 0401 02/02/21 0455 02/03/21 0536 02/04/21 0500  WBC 11.4* 9.6 8.6 7.5 5.6 7.3  NEUTROABS 9.0*  --   --   --   --   --   HGB 8.9* 8.2* 8.4* 8.4* 8.7* 9.2*  HCT 28.8* 26.0* 26.9* 27.4* 27.6* 30.3*  MCV 93.2 93.2 94.1 92.6 94.2 91.8  PLT 114* 97* 90* 82* 71* 87*   Basic Metabolic Panel: Recent Labs  Lab 01/31/21 0327 02/01/21 0401 02/02/21 0455 02/03/21 0536 02/04/21 0500  NA 135 132* 133* 134* 134*  K 3.9 3.6 3.5 3.5 3.5  CL 103 101 102 102 103  CO2 '25 27 25 25 23  '$ GLUCOSE 100* 155* 118* 111* 130*  BUN 24* 27* 31* 32* 31*  CREATININE  2.73* 2.77* 3.08* 3.06* 2.71*  CALCIUM 7.5* 7.2* 7.4* 7.4* 7.7*  MG 1.7 1.9 1.9 2.1 2.1  PHOS 3.8  --   --   --   --    GFR: Estimated Creatinine Clearance: 40.4 mL/min (A) (by C-G formula based on SCr of 2.71 mg/dL (H)). Liver Function Tests: Recent Labs  Lab 01/31/21 0327  AST 33  ALT 21  ALKPHOS 91  BILITOT 1.2  PROT 6.1*  ALBUMIN 1.8*   No results for input(s): LIPASE, AMYLASE in the last 168 hours. No results for input(s): AMMONIA in the last 168 hours. Coagulation Profile: Recent Labs  Lab 01/31/21 0327  INR 1.7*   Cardiac Enzymes: Recent Labs  Lab 02/03/21 0536  CKTOTAL 32*   BNP (last 3 results) No results for input(s): PROBNP in the last 8760 hours. HbA1C: No results for input(s): HGBA1C in the last 72 hours. CBG: Recent Labs  Lab 01/31/21 0234  GLUCAP 89   Lipid Profile: No results for input(s): CHOL, HDL, LDLCALC, TRIG, CHOLHDL, LDLDIRECT in the last 72 hours. Thyroid Function Tests: No results for input(s): TSH, T4TOTAL, FREET4, T3FREE, THYROIDAB in the last 72 hours. Anemia Panel: No results for input(s): VITAMINB12, FOLATE, FERRITIN, TIBC, IRON, RETICCTPCT in the last 72 hours. Sepsis Labs: Recent Labs  Lab 01/31/21 0327  PROCALCITON 33.06  LATICACIDVEN 1.7    Recent Results (from the past 240 hour(s))  Culture, blood (single) w Reflex to ID Panel     Status: None   Collection Time: 01/19/2021  5:15 PM   Specimen: BLOOD LEFT HAND  Result Value Ref Range Status   Specimen Description BLOOD LEFT HAND  Final   Special Requests   Final    BOTTLES DRAWN AEROBIC AND ANAEROBIC Blood Culture results may not be optimal due to an excessive volume of blood  received in culture bottles   Culture   Final    NO GROWTH 5 DAYS Performed at [facility], [address] [place], [place] [address]    Report Status 02/04/2021 FINAL  Final  Urine Culture     Status: None   Collection Time: 01/21/2021  5:40 PM   Specimen: In/Out Cath Urine  Result Value Ref  Range Status   Specimen Description   Final    IN/OUT CATH URINE Performed at [facility], [address] [place], [place] [address]    Special Requests   Final    NONE Performed at [facility], [address], [place], [place] [address]    Culture   Final    NO GROWTH Performed at [facility], [place] [address] [place], [place] [address]    Report Status 02/01/2021 FINAL  Final  Resp Panel by RT-PCR (Flu A&B, Covid) Nasopharyngeal Swab     Status: None   Collection Time: 01/20/2021  5:45 PM   Specimen: Nasopharyngeal Swab; Nasopharyngeal(NP) swabs in vial transport medium  Result Value Ref Range Status   SARS Coronavirus 2 by RT PCR NEGATIVE NEGATIVE Final    Comment: (NOTE) SARS-CoV-2 target nucleic acids are NOT DETECTED.  The SARS-CoV-2 RNA is generally detectable in upper respiratory specimens during the acute phase of infection. The lowest concentration of SARS-CoV-2 viral copies this assay can detect is 138 copies/mL. A negative result does not preclude SARS-Cov-2 infection and should not be used as the sole basis for treatment or other patient management decisions. A negative result may occur with  improper specimen collection/handling, submission of specimen other than nasopharyngeal swab, presence of viral mutation(s) within the areas targeted by this assay, and inadequate number of viral copies(<138 copies/mL). A negative result must be combined with clinical observations, patient history, and epidemiological information. The expected result is Negative.  Fact Sheet for Patients:  EntrepreneurPulse.com.au  Fact Sheet for Healthcare Providers:  IncredibleEmployment.be  This test is no t yet approved or cleared by the [place] FDA and  has been authorized for detection and/or diagnosis of SARS-CoV-2 by FDA under an Emergency Use Authorization (EUA). This EUA will remain  in effect (meaning this test can be used) for the  duration of the COVID-19 declaration under Section 564(b)(1) of the Act, 21 U.S.C.section 360bbb-3(b)(1), unless the authorization is terminated  or revoked sooner.       Influenza A by PCR NEGATIVE NEGATIVE Final   Influenza B by PCR NEGATIVE NEGATIVE Final    Comment: (NOTE) The Xpert Xpress SARS-CoV-2/FLU/RSV plus assay is intended as an aid in the diagnosis of influenza from Nasopharyngeal swab specimens and should not be used as a sole basis for treatment. Nasal washings and aspirates are unacceptable for Xpert Xpress SARS-CoV-2/FLU/RSV testing.  Fact Sheet for Patients: EntrepreneurPulse.com.au  Fact Sheet for Healthcare Providers: IncredibleEmployment.be  This test is not yet approved or cleared by the [place] FDA and has been authorized for detection and/or diagnosis of SARS-CoV-2 by FDA under an Emergency Use Authorization (EUA). This EUA will remain in effect (meaning this test can be used) for the duration of the COVID-19 declaration under Section 564(b)(1) of the Act, 21 U.S.C. section 360bbb-3(b)(1), unless the authorization is terminated or revoked.  Performed at [facility], [address] [place], [place] [address]   MRSA Next Gen by PCR, Nasal     Status: None   Collection Time: 01/09/2021 11:44 PM   Specimen: Nasal Mucosa; Nasal Swab  Result Value Ref Range Status   MRSA by PCR Next Gen NOT DETECTED NOT DETECTED Final    Comment: (NOTE) The GeneXpert MRSA Assay (FDA approved for NASAL specimens only), is one component of a comprehensive MRSA colonization surveillance program. It is not intended to diagnose MRSA infection nor to guide or monitor treatment for MRSA infections. Test performance is not FDA approved in patients less than 22 years old. Performed at [facility], [address] [place], [place] [address]    Radiology Studies: No results found.   Scheduled Meds:  amoxicillin-clavulanate  1 tablet  Oral Q12H   Chlorhexidine Gluconate Cloth  6 each Topical Daily   finasteride  5 mg Oral Daily   fluticasone  1 spray Each Nare Daily   folic acid  1 mg Oral Daily   gabapentin  100 mg Oral BID   nystatin   Topical BID   rifampin  300 mg Oral TID   sodium chloride flush  3 mL Intravenous Q8H   tamsulosin  0.4 mg Oral QHS   topiramate  100 mg Oral Daily     LOS: 5 days   Time spent: 35 minutes Deniz Eskridge Wynetta Emery, MD [facility]  If 7PM-7AM, please contact night-coverage [email or web address] 02/04/2021, 11:24 AM

## 2021-02-04 NOTE — Plan of Care (Signed)
  Problem: Education: Goal: Knowledge of General Education information will improve Description Including pain rating scale, medication(s)/side effects and non-pharmacologic comfort measures Outcome: Progressing   

## 2021-02-04 NOTE — Progress Notes (Signed)
Physical Therapy Treatment Patient Details Name: Eddie Fletcher MRN: [id number] DOB: January 09, 1956 Today's Date: 02/04/2021   History of Present Illness Eddie Fletcher is a 65 y.o. male with medical history significant for hypertension, chronic back pain, multiple falls, sleep apnea, morbid obesity, BPH who presents to the emergency department from [facility] via EMS due to 1 day onset of fever and was also noted to be somewhat more somnolent.  He is being actively treated with IV vancomycin for cellulitis of the scrotum and legs and the concern was his febrile episode as well as some hypoxemia.    PT Comments    Patient demonstrates slow labored movement for sitting up at bedside requiring multiple attempts due to generalized weakness.  Patient attempts to launch self forward by kicking legs and required [facility] raised for safety and repeated verbal/tactile cueing to use bed rail with fair carryover.  Patient unable to complete sit to stands due to BLE weakness after multiple attempts and required Mod assist to move legs when getting back into bed.  Patient will benefit from continued physical therapy in hospital and recommended venue below to increase strength, balance, endurance for safe ADLs and gait.     Recommendations for follow up therapy are one component of a multi-disciplinary discharge planning process, led by the attending physician.  Recommendations may be updated based on patient status, additional functional criteria and insurance authorization.  Follow Up Recommendations  SNF     Equipment Recommendations  None recommended by PT    Recommendations for Other Services       Precautions / Restrictions Precautions Precautions: Fall Restrictions Weight Bearing Restrictions: No     Mobility  Bed Mobility Overal bed mobility: Needs Assistance Bed Mobility: Supine to Sit;Sit to Supine     Supine to sit: Mod assist;HOB elevated Sit to supine: Mod assist   General bed  mobility comments: had difficulty sitting up at bedside requiring repeated attempts and use of bed rail with HOB rasied    Transfers Overall transfer level: Needs assistance Equipment used: Rolling walker (2 wheeled) Transfers: Sit to/from Stand Sit to Stand: Min assist;Mod assist         General transfer comment: Min to mod A for x3 reps of sit to stand from elevated bed surface using RW. With reps pt assist level improved to more Min A. Pt stood once without RW to scoot to head of bed with Min to mod A from therapist providing support anteriorly.  Ambulation/Gait                 Stairs             Wheelchair Mobility    Modified Rankin (Stroke Patients Only)       Balance Overall balance assessment: Needs assistance Sitting-balance support: Feet supported;No upper extremity supported Sitting balance-Leahy Scale: Fair Sitting balance - Comments: fair/good seated at EOB   Standing balance support: Bilateral upper extremity supported;During functional activity Standing balance-Leahy Scale: Poor Standing balance comment: using RW                            Cognition Arousal/Alertness: Awake/alert Behavior During Therapy: WFL for tasks assessed/performed Overall Cognitive Status: Within Functional Limits for tasks assessed  Exercises General Exercises - Lower Extremity Long Arc Quad: Seated;AROM;Strengthening;Both;10 reps Hip Flexion/Marching: Seated;AROM;Strengthening;Both;5 reps Toe Raises: Seated;AROM;Strengthening;Both;10 reps Heel Raises: Seated;AROM;Strengthening;Both;10 reps    General Comments        Pertinent Vitals/Pain Pain Assessment: No/denies pain Pain Score: 4  Pain Location: back Pain Descriptors / Indicators: Sharp Pain Intervention(s): Limited activity within patient's tolerance;Monitored during session;Repositioned    Home Living                       Prior Function            PT Goals (current goals can now be found in the care plan section) Acute Rehab PT Goals Patient Stated Goal: return home after rehab Potential to Achieve Goals: Good Progress towards PT goals: Progressing toward goals    Frequency    Min 3X/week      PT Plan      Co-evaluation              AM-PAC PT "6 Clicks" Mobility   Outcome Measure  Help needed turning from your back to your side while in a flat bed without using bedrails?: A Little Help needed moving from lying on your back to sitting on the side of a flat bed without using bedrails?: A Lot Help needed moving to and from a bed to a chair (including a wheelchair)?: A Lot Help needed standing up from a chair using your arms (e.g., wheelchair or bedside chair)?: A Lot Help needed to walk in hospital room?: Total Help needed climbing 3-5 steps with a railing? : Total 6 Click Score: 11    End of Session   Activity Tolerance: Patient tolerated treatment well;Patient limited by fatigue Patient left: in bed;with call bell/phone within reach;with bed alarm set Nurse Communication: Mobility status PT Visit Diagnosis: Difficulty in walking, not elsewhere classified (R26.2);Muscle weakness (generalized) (M62.81);Unsteadiness on feet (R26.81)     Time: 1435-1501 PT Time Calculation (min) (ACUTE ONLY): 26 min  Charges:  $Therapeutic Exercise: 8-22 mins $Therapeutic Activity: 8-22 mins                     3:29 PM, 02/04/21 Eddie Fletcher, MPT Physical Therapist with [facility] [phone] office [phone] mobile phone

## 2021-02-04 NOTE — Progress Notes (Signed)
Occupational Therapy Treatment Patient Details Name: Eddie Fletcher MRN: [id number] DOB: April 19, 1956 Today's Date: 02/04/2021   History of present illness Eddie Fletcher is a 65 y.o. male with medical history significant for hypertension, chronic back pain, multiple falls, sleep apnea, morbid obesity, BPH who presents to the emergency department from [facility] via EMS due to 1 day onset of fever and was also noted to be somewhat more somnolent.  He is being actively treated with IV vancomycin for cellulitis of the scrotum and legs and the concern was his febrile episode as well as some hypoxemia.   OT comments  Pt agreeable to OT treatment this date. Pt demonstrates much improved arousal level today. Pt was able to complete bed mobility with HOB raised supine to sit with Min A. Mod A needed for sit to supine. Pt was able to complete x3 reps of sit to stand with use of RW and Min to mod A from therapist. Pt stood once without RW needing min to mod A to stand and scoot to head of bed. Pt left in bed with bed alarm set and call bell within reach. Pt will benefit from continued OT in the hospital and recommended venue below to increase strength, balance, and endurance for safe ADL's.    Recommendations for follow up therapy are one component of a multi-disciplinary discharge planning process, led by the attending physician.  Recommendations may be updated based on patient status, additional functional criteria and insurance authorization.    Follow Up Recommendations  SNF    Equipment Recommendations  None recommended by OT          Precautions / Restrictions Precautions Precautions: Fall Restrictions Weight Bearing Restrictions: No       Mobility Bed Mobility Overal bed mobility: Needs Assistance Bed Mobility: Supine to Sit;Sit to Supine     Supine to sit: Min assist;HOB elevated Sit to supine: Mod assist   General bed mobility comments: Pt demonstrated improved bed mobility;  labored movement still, but less assist needed for supine to sit.    Transfers Overall transfer level: Needs assistance Equipment used: Rolling walker (2 wheeled) Transfers: Sit to/from Stand Sit to Stand: Min assist;Mod assist         General transfer comment: Min to mod A for x3 reps of sit to stand from elevated bed surface using RW. With reps pt assist level improved to more Min A. Pt stood once without RW to scoot to head of bed with Min to mod A from therapist providing support anteriorly.    Balance Overall balance assessment: Needs assistance Sitting-balance support: No upper extremity supported;Feet supported Sitting balance-Leahy Scale: Good Sitting balance - Comments: seated at EOB   Standing balance support: Bilateral upper extremity supported;During functional activity Standing balance-Leahy Scale: Poor Standing balance comment: using RW                           ADL either performed or assessed with clinical judgement   ADL Overall ADL's : Needs assistance/impaired                     Lower Body Dressing: Total assistance;Bed level Lower Body Dressing Details (indicate cue type and reason): Total assist to dof and don socks at bed level.  Cognition Arousal/Alertness: Awake/alert Behavior During Therapy: WFL for tasks assessed/performed Overall Cognitive Status: Within Functional Limits for tasks assessed                                                            Pertinent Vitals/ Pain       Pain Assessment: 0-10 Pain Score: 4  Pain Location: back Pain Descriptors / Indicators: Sharp Pain Intervention(s): Limited activity within patient's tolerance;Monitored during session;Repositioned                                                          Frequency  Min 2X/week        Progress Toward Goals  OT Goals(current goals can now  be found in the care plan section)  Progress towards OT goals: Progressing toward goals  Acute Rehab OT Goals Patient Stated Goal: Pt open to rehab but not [place]. OT Goal Formulation: With patient Time For Goal Achievement: 02/16/21 Potential to Achieve Goals: Fair ADL Goals Pt Will Perform Grooming: with set-up;sitting;with adaptive equipment Pt Will Perform Lower Body Dressing: with min assist;with mod assist;sitting/lateral leans;with adaptive equipment Pt Will Transfer to Toilet: with mod assist;stand pivot transfer;with max assist;bedside commode Pt/caregiver will Perform Home Exercise Program: Increased strength;Both right and left upper extremity;With Supervision  Plan Discharge plan remains appropriate                                    End of Session Equipment Utilized During Treatment: Rolling walker  OT Visit Diagnosis: Unsteadiness on feet (R26.81);Muscle weakness (generalized) (M62.81)   Activity Tolerance Patient tolerated treatment well   Patient Left in bed;with call bell/phone within reach;with nursing/sitter in room;with bed alarm set             Time: [id number] OT Time Calculation (min): 25 min  Charges: OT General Charges $OT Visit: 1 Visit OT Treatments $Self Care/Home Management : 23-37 mins  Eddie Fletcher OT, MOT  Larey Seat 02/04/2021, 12:43 PM

## 2021-02-05 DIAGNOSIS — J9601 Acute respiratory failure with hypoxia: Secondary | ICD-10-CM | POA: Diagnosis not present

## 2021-02-05 DIAGNOSIS — R509 Fever, unspecified: Secondary | ICD-10-CM | POA: Diagnosis not present

## 2021-02-05 DIAGNOSIS — L03313 Cellulitis of chest wall: Secondary | ICD-10-CM | POA: Diagnosis not present

## 2021-02-05 MED ORDER — FUROSEMIDE 40 MG PO TABS
40.0000 mg | ORAL_TABLET | Freq: Every day | ORAL | Status: DC
  Administered 2021-02-05 – 2021-02-11 (×7): 40 mg via ORAL
  Filled 2021-02-05 (×8): qty 1

## 2021-02-05 NOTE — Care Management Important Message (Signed)
Important Message  Patient Details  Name: Eddie Fletcher MRN: [id number] Date of Birth: 05/07/1956   Medicare Important Message Given:  Yes     Tommy Medal 02/05/2021, 1:39 PM

## 2021-02-05 NOTE — TOC Progression Note (Signed)
Transition of Care [facility]) - Progression Note    Patient Details  Name: Eddie Fletcher MRN: [id number] Date of Birth: [birth year]  [facility] of [facility]) CM/SW Contact  Natasha Bence, LCSW Phone Number: 02/05/2021, 3:54 PM  Clinical Narrative:    CSW spoke with Angela Nevin from [place] to inquire if Josem Kaufmann has been received. Angela Nevin reported that updated clinicals needed to be submitted.CSW notified Angela Nevin that patient has updated PT notes. Angela Nevin agreeable to provide updated clinicals for auth. TOC to follow.   Expected Discharge Plan: Skilled Nursing Facility Barriers to Discharge: Ship broker  Expected Discharge Plan and Services Expected Discharge Plan: [place] In-house Referral: Clinical Social Work   Post Acute Care Choice: [place] Living arrangements for the past 2 months: [place]                                       Social Determinants of Health (SDOH) Interventions    Readmission Risk Interventions No flowsheet data found.

## 2021-02-05 NOTE — Progress Notes (Signed)
PROGRESS NOTE    MICHAEAL Fletcher  [id number] DOB: 1956-04-25 DOA: 01/23/2021 PCP: Curly Rim, MD   Brief Narrative:   Eddie Fletcher is a 65 y.o. male with medical history significant for hypertension, chronic back pain, multiple falls, sleep apnea, morbid obesity, BPH who presents to the emergency department from [facility] via EMS due to 1 day onset of fever and was also noted to be somewhat more somnolent.  He is being actively treated with IV vancomycin for cellulitis of the scrotum and legs and the concern was his febrile episode as well as some hypoxemia which is related to pneumonia.  He is actively being treated with antibiotics as noted below and is awaiting placement to new SNF if possible.  Insurance authorization pending as well.  Assessment & Plan:   Principal Problem:   Cellulitis of chest wall Active Problems:   Obstructive sleep apnea   Chronic low back pain   Morbid obesity with BMI of 60.0-69.9, adult (HCC)   Multiple falls   Elevated brain natriuretic peptide (BNP) level   Pulmonary edema   Splenomegaly, not elsewhere classified   Ascites   Chronic venous stasis dermatitis of both lower extremities   Chronic kidney disease   Acute febrile illness   Acute respiratory failure with hypoxia (HCC)   Cellulitis of scrotum   Acute fever with hypoxemia likely related to new pneumonia-improved -In the setting of current scrotal cellulitis with right knee prosthetic infection -Continues currently on vancomycin as well as rifampin as recommended for 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 for total 6 weeks which began on 9/7; being followed by ID Dr. 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 and moderate splenomegaly noted on CT scan -Continue to monitor   AKI on CKD stage IV -Hold further diuretics -Discontinue further IV fluid and monitor  -Baseline creatinine 1.8-2.5 -Continue to monitor  Mild hyponatremia -Currently stable, continue to monitor -Consider further testing as needed   Chronic venous stasis dermatitis -Stable   OSA -Refuses CPAP   Chronic low back pain/multiple falls -Pain medications as needed -Fall precautions   Thrombocytopenia -Likely related to splenomegaly -Hold further Lovenox and maintain on SCDs -Monitor repeat CBC to trend   Morbid obesity -Lifestyle changes outpatient     DVT prophylaxis:SCDs Code Status: Full Family Communication: Wife on phone 9/25 Disposition Plan: awaiting for insurance authorization for SNF placement Status is: Inpatient   Remains inpatient appropriate because:IV treatments appropriate due to intensity of illness or inability to take PO and Inpatient level of care appropriate due to severity of illness   Dispo: The patient is from: SNF              Anticipated d/c is to: SNF               Patient currently is medically stable to d/c.              Difficult to place patient No  Consultants:  None   Procedures:  See below  Antimicrobials:  Anti-infectives (From admission, onward)    Start     Dose/Rate Route Frequency Ordered Stop   02/03/21 2000  DAPTOmycin (CUBICIN) 900 mg in sodium chloride 0.9 % IVPB        900 mg 136 mL/hr over 30 Minutes Intravenous Daily 02/03/21 1516     02/02/21 1410  vancomycin variable dose per unstable renal function (pharmacist dosing)  Status:   Discontinued         Does not apply See admin instructions 02/02/21 1410 02/03/21 1516   02/02/21 1400  rifampin (RIFADIN) capsule 300 mg        300 mg Oral 3 times daily 02/02/21 1106     02/02/21 1330  amoxicillin-clavulanate (AUGMENTIN) 875-125 MG per tablet 1 tablet        1 tablet Oral Every 12 hours 02/02/21 1238     02/02/21 1000  rifampin (RIFADIN) capsule 450 mg  Status:  Discontinued        450 mg Oral Every 12 hours 02/02/21 0701 02/02/21 1106   02/02/21 0830  vancomycin (VANCOREADY) IVPB 2000 mg/400 mL        2,000 mg 200 mL/hr over 120 Minutes Intravenous  Once 02/02/21 0803 02/02/21 1547   01/31/21 0330  ceFEPIme (MAXIPIME) 2 g in sodium chloride 0.9 % 100 mL IVPB  Status:  Discontinued        2 g 200 mL/hr over 30 Minutes Intravenous Every 12 hours 01/13/2021 2214 02/02/21 0701   01/31/21 0030  linezolid (ZYVOX) tablet 600 mg  Status:  Discontinued        600 mg Oral Every 12 hours 01/09/2021 2344 02/02/21 0701   01/29/2021 2330  vancomycin (VANCOREADY) IVPB 1500 mg/300 mL  Status:  Discontinued       See Hyperspace for full Linked Orders Report.   1,500 mg 150 mL/hr over 120 Minutes Intravenous  Once 02/02/2021 2148 01/12/2021 2151   01/12/2021 2230  vancomycin (VANCOCIN) IVPB 1000 mg/200 mL premix  Status:  Discontinued       See Hyperspace for full Linked Orders Report.   1,000 mg 200 mL/hr over 60 Minutes Intravenous  Once 01/22/2021 2148 01/27/2021 2151   01/09/2021 2145  vancomycin (VANCOCIN) IVPB 1000 mg/200 mL premix  Status:  Discontinued        1,000 mg 200 mL/hr over 60 Minutes Intravenous  Once 01/10/2021 2140 01/09/2021 2145   01/29/2021 2145  ceFEPIme (MAXIPIME) 2 g in sodium chloride 0.9 % 100 mL IVPB  Status:  Discontinued        2 g 200 mL/hr over 30 Minutes Intravenous  Once 01/24/2021 2140 01/22/2021 2212   01/17/2021 1830  aztreonam (AZACTAM) 2 g in sodium chloride 0.9 % 100 mL IVPB        2 g 200 mL/hr over 30 Minutes Intravenous  Once 01/27/2021 1821 01/23/2021 2034        Subjective: He reports that he wants to restart lasix.    Objective: Vitals:   02/04/21 2111 02/05/21 0547 02/05/21 0920 02/05/21 1245  BP: (!) 134/55 (!) 148/66  135/63  Pulse: 87 94  87  Resp: '19 17  20  '$ Temp: 98.4 F (36.9 C) 98.2 F (36.8 C)  98.8 F (37.1 C)  TempSrc: Oral   Oral  SpO2: 99% 97% 97% 96%  Weight:      Height:        Intake/Output Summary (Last 24 hours) at  02/05/2021 1350 Last data filed at 02/05/2021 1300 Gross per 24 hour  Intake 400 ml  Output 800 ml  Net -400 ml   Filed Weights   02/02/21 0531 02/03/21 0713 02/03/21 1400  Weight: (!) 178.8 kg 109.3 kg (!) 163.9 kg   Examination:  General exam: NAD. Cooperative.  Appears calm and comfortable, morbidly obese Respiratory system: Clear to auscultation. Respiratory effort normal.  Currently on room air. Cardiovascular system: S1 & S2 heard, RRR.  Gastrointestinal system: Abdomen is obese, normal BS, no HSM, soft GU: edematous scrotum, erythematous skin, no weeping seen.  Central nervous system: Alert and awake Extremities: Chronic venous stasis changes, with scattered erythema. Scarring of right knee. Skin: No significant lesions noted Psychiatry: normal affect.   Data Reviewed: I have personally reviewed following labs and imaging studies  CBC: Recent Labs  Lab 02/02/2021 1715 01/31/21 0327 02/01/21 0401 02/02/21 0455 02/03/21 0536 02/04/21 0500  WBC 11.4* 9.6 8.6 7.5 5.6 7.3  NEUTROABS 9.0*  --   --   --   --   --   HGB 8.9* 8.2* 8.4* 8.4* 8.7* 9.2*  HCT 28.8* 26.0* 26.9* 27.4* 27.6* 30.3*  MCV 93.2 93.2 94.1 92.6 94.2 91.8  PLT 114* 97* 90* 82* 71* 87*   Basic Metabolic Panel: Recent Labs  Lab 01/31/21 0327 02/01/21 0401 02/02/21 0455 02/03/21 0536 02/04/21 0500  NA 135 132* 133* 134* 134*  K 3.9 3.6 3.5 3.5 3.5  CL 103 101 102 102 103  CO2 '25 27 25 25 23  '$ GLUCOSE 100* 155* 118* 111* 130*  BUN 24* 27* 31* 32* 31*  CREATININE 2.73* 2.77* 3.08* 3.06* 2.71*  CALCIUM 7.5*  7.2* 7.4* 7.4* 7.7*  MG 1.7 1.9 1.9 2.1 2.1  PHOS 3.8  --   --   --   --    GFR: Estimated Creatinine Clearance: 40.4 mL/min (A) (by C-G formula based on SCr of 2.71 mg/dL (H)). Liver Function Tests: Recent Labs  Lab 01/31/21 0327  AST 33  ALT 21  ALKPHOS 91  BILITOT 1.2  PROT 6.1*  ALBUMIN 1.8*   No results for input(s): LIPASE, AMYLASE in the last 168 hours. No results for input(s): AMMONIA in the last 168 hours. Coagulation Profile: Recent Labs  Lab 01/31/21 0327  INR 1.7*   Cardiac Enzymes: Recent Labs  Lab 02/03/21 0536  CKTOTAL 32*   BNP (last 3 results) No results for input(s): PROBNP in the last 8760 hours. HbA1C: No results for input(s): HGBA1C in the last 72 hours. CBG: Recent Labs  Lab 01/31/21 0234  GLUCAP 89   Lipid Profile: No results for input(s): CHOL, HDL, LDLCALC, TRIG, CHOLHDL, LDLDIRECT in the last 72 hours. Thyroid Function Tests: No results for input(s): TSH, T4TOTAL, FREET4, T3FREE, THYROIDAB in the last 72 hours. Anemia Panel: No results for input(s): VITAMINB12, FOLATE, FERRITIN, TIBC, IRON, RETICCTPCT in the last 72 hours. Sepsis Labs: Recent Labs  Lab 01/31/21 0327  PROCALCITON 33.06  LATICACIDVEN 1.7    Recent Results (from the past 240 hour(s))  Culture, blood (single) w Reflex to ID Panel     Status: None   Collection Time: 01/31/2021  5:15 PM   Specimen: BLOOD LEFT HAND  Result Value Ref Range Status   Specimen Description BLOOD LEFT HAND  Final   Special Requests   Final    BOTTLES DRAWN AEROBIC AND ANAEROBIC Blood Culture results may not be optimal due to an excessive volume of blood received in culture bottles  Culture   Final    NO GROWTH 5 DAYS Performed at [facility], [address] [place], [place] [address]    Report Status 02/04/2021 FINAL  Final  Urine Culture     Status: None   Collection Time: 01/18/2021  5:40 PM   Specimen: In/Out Cath Urine  Result Value Ref Range Status   Specimen Description   Final     IN/OUT CATH URINE Performed at [facility], [address] [place], [place] [address]    Special Requests   Final    NONE Performed at [facility], [address], [place], [place] [address]    Culture   Final    NO GROWTH Performed at [facility], [place] [address] [place], [place] [address]    Report Status 02/01/2021 FINAL  Final  Resp Panel by RT-PCR (Flu A&B, Covid) Nasopharyngeal Swab     Status: None   Collection Time: 01/24/2021  5:45 PM   Specimen: Nasopharyngeal Swab; Nasopharyngeal(NP) swabs in vial transport medium  Result Value Ref Range Status   SARS Coronavirus 2 by RT PCR NEGATIVE NEGATIVE Final    Comment: (NOTE) SARS-CoV-2 target nucleic acids are NOT DETECTED.  The SARS-CoV-2 RNA is generally detectable in upper respiratory specimens during the acute phase of infection. The lowest concentration of SARS-CoV-2 viral copies this assay can detect is 138 copies/mL. A negative result does not preclude SARS-Cov-2 infection and should not be used as the sole basis for treatment or other patient management decisions. A negative result may occur with  improper specimen collection/handling, submission of specimen other than nasopharyngeal swab, presence of viral mutation(s) within the areas targeted by this assay, and inadequate number of viral copies(<138 copies/mL). A negative result must be combined with clinical observations, patient history, and epidemiological information. The expected result is Negative.  Fact Sheet for Patients:  EntrepreneurPulse.com.au  Fact Sheet for Healthcare Providers:  IncredibleEmployment.be  This test is no t yet approved or cleared by the [place] FDA and  has been authorized for detection and/or diagnosis of SARS-CoV-2 by FDA under an Emergency Use Authorization (EUA). This EUA will remain  in effect (meaning this test can be used) for the duration of the COVID-19 declaration under  Section 564(b)(1) of the Act, 21 U.S.C.section 360bbb-3(b)(1), unless the authorization is terminated  or revoked sooner.       Influenza A by PCR NEGATIVE NEGATIVE Final   Influenza B by PCR NEGATIVE NEGATIVE Final    Comment: (NOTE) The Xpert Xpress SARS-CoV-2/FLU/RSV plus assay is intended as an aid in the diagnosis of influenza from Nasopharyngeal swab specimens and should not be used as a sole basis for treatment. Nasal washings and aspirates are unacceptable for Xpert Xpress SARS-CoV-2/FLU/RSV testing.  Fact Sheet for Patients: EntrepreneurPulse.com.au  Fact Sheet for Healthcare Providers: IncredibleEmployment.be  This test is not yet approved or cleared by the [place] FDA and has been authorized for detection and/or diagnosis of SARS-CoV-2 by FDA under an Emergency Use Authorization (EUA). This EUA will remain in effect (meaning this test can be used) for the duration of the COVID-19 declaration under Section 564(b)(1) of the Act, 21 U.S.C. section 360bbb-3(b)(1), unless the authorization is terminated or revoked.  Performed at [facility], [address] [place], [place] [address]   MRSA Next Gen by PCR, Nasal     Status: None   Collection Time: 01/27/2021 11:44 PM   Specimen: Nasal Mucosa; Nasal Swab  Result Value Ref Range Status  MRSA by PCR Next Gen NOT DETECTED NOT DETECTED Final    Comment: (NOTE) The GeneXpert MRSA Assay (FDA approved for NASAL specimens only), is one component of a comprehensive MRSA colonization surveillance program. It is not intended to diagnose MRSA infection nor to guide or monitor treatment for MRSA infections. Test performance is not FDA approved in patients less than 54 years old. Performed at [facility], [address] [place], [place] [address]    Radiology Studies: No results found.   Scheduled Meds:  amoxicillin-clavulanate  1 tablet Oral Q12H   Chlorhexidine Gluconate Cloth  6  each Topical Daily   finasteride  5 mg Oral Daily   fluticasone  1 spray Each Nare Daily   folic acid  1 mg Oral Daily   furosemide  40 mg Oral Daily   gabapentin  100 mg Oral BID   nystatin   Topical BID   rifampin  300 mg Oral TID   saccharomyces boulardii  250 mg Oral BID   sodium chloride flush  3 mL Intravenous Q8H   tamsulosin  0.4 mg Oral QHS   topiramate  100 mg Oral Daily     LOS: 6 days   Time spent: 35 minutes Imogen Maddalena Wynetta Emery, MD [facility]  If 7PM-7AM, please contact night-coverage [email or web address] 02/05/2021, 1:50 PM

## 2021-02-06 ENCOUNTER — Inpatient Hospital Stay (HOSPITAL_COMMUNITY): Payer: Medicare HMO

## 2021-02-06 DIAGNOSIS — L03313 Cellulitis of chest wall: Secondary | ICD-10-CM | POA: Diagnosis not present

## 2021-02-06 LAB — CBC WITH DIFFERENTIAL/PLATELET
Abs Immature Granulocytes: 0.1 10*3/uL — ABNORMAL HIGH (ref 0.00–0.07)
Basophils Absolute: 0.1 10*3/uL (ref 0.0–0.1)
Basophils Relative: 1 %
Eosinophils Absolute: 0.1 10*3/uL (ref 0.0–0.5)
Eosinophils Relative: 1 %
HCT: 33.5 % — ABNORMAL LOW (ref 39.0–52.0)
Hemoglobin: 10.3 g/dL — ABNORMAL LOW (ref 13.0–17.0)
Immature Granulocytes: 1 %
Lymphocytes Relative: 6 %
Lymphs Abs: 0.6 10*3/uL — ABNORMAL LOW (ref 0.7–4.0)
MCH: 28.3 pg (ref 26.0–34.0)
MCHC: 30.7 g/dL (ref 30.0–36.0)
MCV: 92 fL (ref 80.0–100.0)
Monocytes Absolute: 0.6 10*3/uL (ref 0.1–1.0)
Monocytes Relative: 6 %
Neutro Abs: 7.9 10*3/uL — ABNORMAL HIGH (ref 1.7–7.7)
Neutrophils Relative %: 85 %
Platelets: 110 10*3/uL — ABNORMAL LOW (ref 150–400)
RBC: 3.64 MIL/uL — ABNORMAL LOW (ref 4.22–5.81)
RDW: 16.2 % — ABNORMAL HIGH (ref 11.5–15.5)
WBC: 9.3 10*3/uL (ref 4.0–10.5)
nRBC: 0 % (ref 0.0–0.2)

## 2021-02-06 LAB — URINALYSIS, COMPLETE (UACMP) WITH MICROSCOPIC
Bilirubin Urine: NEGATIVE
Glucose, UA: NEGATIVE mg/dL
Ketones, ur: NEGATIVE mg/dL
Leukocytes,Ua: NEGATIVE
Nitrite: NEGATIVE
Protein, ur: 30 mg/dL — AB
Specific Gravity, Urine: 1.01 (ref 1.005–1.030)
pH: 5 (ref 5.0–8.0)

## 2021-02-06 LAB — PROCALCITONIN: Procalcitonin: 5.28 ng/mL

## 2021-02-06 MED ORDER — ACETAMINOPHEN 325 MG PO TABS
650.0000 mg | ORAL_TABLET | Freq: Four times a day (QID) | ORAL | Status: DC
  Administered 2021-02-06 – 2021-02-14 (×25): 650 mg via ORAL
  Filled 2021-02-06 (×24): qty 2

## 2021-02-06 MED ORDER — ACETAMINOPHEN 650 MG RE SUPP: 650.0000 mg | Freq: Four times a day (QID) | RECTAL | Status: DC

## 2021-02-06 MED ORDER — ACETAMINOPHEN 325 MG PO TABS
325.0000 mg | ORAL_TABLET | Freq: Once | ORAL | Status: AC
  Administered 2021-02-06: 325 mg via ORAL
  Filled 2021-02-06: qty 1

## 2021-02-06 NOTE — Progress Notes (Signed)
   02/06/21 1705  Assess: MEWS Score  Temp (!) 101.6 F (38.7 C)  BP (!) 94/44  Pulse Rate (!) 109  SpO2 92 %  O2 Device Nasal Cannula  Assess: MEWS Score  MEWS Temp 2  MEWS Systolic 1  MEWS Pulse 1  MEWS RR 0  MEWS LOC 0  MEWS Score 4  MEWS Score Color Red  Assess: if the MEWS score is Yellow or Red  Were vital signs taken at a resting state? Yes  Focused Assessment No change from prior assessment  Treat  Pain Score 0  Take Vital Signs  Increase Vital Sign Frequency  Red: Q 1hr X 4 then Q 4hr X 4, if remains red, continue Q 4hrs  Escalate  MEWS: Escalate Red: discuss with charge nurse/RN and provider, consider discussing with RRT  Notify: Charge Nurse/RN  Name of Charge Nurse/RN Notified Frazier Butt  Date Charge Nurse/RN Notified 02/06/21  Time Charge Nurse/RN Notified 69  Notify: Provider  Provider Name/Title Dr. Wynetta Emery  Date Provider Notified 02/06/21  Time Provider Notified 1710  Notification Type Page  Date of Provider Response 02/06/21  Time of Provider Response 1710  Document  Patient Outcome Not stable and remains on department  Progress note created (see row info) Yes

## 2021-02-06 NOTE — Plan of Care (Signed)

## 2021-02-06 NOTE — Progress Notes (Signed)
   02/06/21 1435  Assess: MEWS Score  Temp (!) 103 F (39.4 C)  BP (!) 107/42  ECG Heart Rate (!) 112  Level of Consciousness Alert  SpO2 95 %  O2 Device Nasal Cannula  O2 Flow Rate (L/min) 3 L/min  Assess: MEWS Score  MEWS Temp 2  MEWS Systolic 0  MEWS Pulse 2  MEWS RR 0  MEWS LOC 0  MEWS Score 4  MEWS Score Color Red  Assess: if the MEWS score is Yellow or Red  Were vital signs taken at a resting state? Yes  Focused Assessment No change from prior assessment  Treat  MEWS Interventions Administered prn meds/treatments  Pain Scale 0-10  Pain Score 0  Take Vital Signs  Increase Vital Sign Frequency  Red: Q 1hr X 4 then Q 4hr X 4, if remains red, continue Q 4hrs  Escalate  MEWS: Escalate Red: discuss with charge nurse/RN and provider, consider discussing with RRT  Notify: Charge Nurse/RN  Name of Charge Nurse/RN Notified Frazier Butt RN/ [facility]  Date Charge Nurse/RN Notified 02/06/21  Time Charge Nurse/RN Notified 1449  Notify: Provider  Provider Name/Title Dr. Wynetta Emery  Date Provider Notified 02/06/21  Time Provider Notified 1444  Notification Type Page  Notification Reason Other (Comment) (temp elevated to 103)  Provider response See new orders  Date of Provider Response 02/06/21  Time of Provider Response 1448

## 2021-02-06 NOTE — Progress Notes (Signed)
   02/06/21 1200  Assess: if the MEWS score is Yellow or Red  Were vital signs taken at a resting state? Yes  Focused Assessment No change from prior assessment  Treat  MEWS Interventions Administered prn meds/treatments  Pain Scale 0-10  Pain Score 9  Faces Pain Scale 2  Pain Type Chronic pain  Pain Location Back  Pain Orientation Lower  Pain Descriptors / Indicators Discomfort  Pain Frequency Intermittent  Pain Onset With Activity  Patients Stated Pain Goal 2  Pain Intervention(s) Medication (See eMAR)  Take Vital Signs  Increase Vital Sign Frequency  Yellow: Q 2hr X 2 then Q 4hr X 2, if remains yellow, continue Q 4hrs  Escalate  MEWS: Escalate Yellow: discuss with charge nurse/RN and consider discussing with provider and RRT  Notify: Provider  Provider Name/Title Dr. Wynetta Emery  Date Provider Notified 02/06/21  Time Provider Notified 1205  Notification Type Page  Notification Reason Other (Comment) (elevated temp)

## 2021-02-06 NOTE — Progress Notes (Signed)
   02/06/21 1613  Assess: MEWS Score  Temp (!) 102.8 F (39.3 C)  BP 96/60  Pulse Rate (!) 111  SpO2 96 %  O2 Device Nasal Cannula  Assess: MEWS Score  MEWS Temp 2  MEWS Systolic 1  MEWS Pulse 2  MEWS RR 0  MEWS LOC 0  MEWS Score 5  MEWS Score Color Red  Assess: if the MEWS score is Yellow or Red  Were vital signs taken at a resting state? Yes  Focused Assessment No change from prior assessment  Treat  MEWS Interventions Other (Comment) (contacted Dr. Wynetta Emery)  Pain Scale 0-10  Pain Score 0  Take Vital Signs  Increase Vital Sign Frequency  Red: Q 1hr X 4 then Q 4hr X 4, if remains red, continue Q 4hrs  Escalate  MEWS: Escalate Red: discuss with charge nurse/RN and provider, consider discussing with RRT  Notify: Charge Nurse/RN  Name of Charge Nurse/RN Notified Stanton Kidney Ann/Ann Geoffry Paradise  Date Charge Nurse/RN Notified 02/06/21  Time Charge Nurse/RN Notified 85  Notify: Provider  Provider Name/Title Dr. Wynetta Emery  Date Provider Notified 02/06/21  Time Provider Notified 1630  Notification Type Page  Notification Reason Other (Comment)  Provider response Other (Comment) (Recheck in hour and consider moving to higher level of care)  Date of Provider Response 02/06/21  Time of Provider Response 1630  Document  Patient Outcome Not stable and remains on department  Progress note created (see row info) Yes

## 2021-02-06 NOTE — Progress Notes (Signed)
PROGRESS NOTE    Eddie Fletcher  [id number] DOB: [birth year] DOA: 01/22/2021 PCP: Curly Rim, MD   Brief Narrative:   Eddie Fletcher is a 65 y.o. male with medical history significant for hypertension, chronic back pain, multiple falls, sleep apnea, morbid obesity, BPH who presents to the emergency department from [facility] via EMS due to 1 day onset of fever and was also noted to be somewhat more somnolent.  He is being actively treated with IV vancomycin for cellulitis of the scrotum and legs and the concern was his febrile episode as well as some hypoxemia which is related to pneumonia.  He is actively being treated with antibiotics as noted below and is awaiting placement to new SNF if possible.  Insurance authorization pending as well.  Assessment & Plan:   Principal Problem:   Cellulitis of chest wall Active Problems:   Obstructive sleep apnea   Chronic low back pain   Morbid obesity with BMI of 60.0-69.9, adult (HCC)   Multiple falls   Elevated brain natriuretic peptide (BNP) level   Pulmonary edema   Splenomegaly, not elsewhere classified   Ascites   Chronic venous stasis dermatitis of both lower extremities   Chronic kidney disease   Acute febrile illness   Acute respiratory failure with hypoxia (HCC)   Cellulitis of scrotum   Acute fever with hypoxemia likely related to new pneumonia-improved -In the setting of current scrotal cellulitis with right knee prosthetic infection -Continues currently on vancomycin as well as rifampin as recommended for 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 for total 6 weeks which began on 9/7; being followed by ID Dr. Layne Benton at [facility] for pneumonia for total 7-day course of treatment, currently day 7/7 -Patient has PICC line  Fever 101 - check blood cultures x 2, chest x ray, CBC diff.   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 and moderate splenomegaly noted on CT scan -Continue to monitor   AKI on CKD stage IV -Hold further diuretics -Discontinue further IV fluid and monitor  -Baseline creatinine 1.8-2.5 -Continue to monitor  Mild hyponatremia -Currently stable, continue to monitor -Consider further testing as needed   Chronic venous stasis dermatitis -Stable   OSA -Refuses CPAP   Chronic low back pain/multiple falls -Pain medications as needed -Fall precautions   Thrombocytopenia -Likely related to splenomegaly -Hold further Lovenox and maintain on SCDs -Monitor repeat CBC to trend   Morbid obesity -Lifestyle changes outpatient     DVT prophylaxis:SCDs Code Status: Full Family Communication: Wife on phone 9/25, 9/29 Disposition Plan: awaiting for insurance authorization for SNF placement Status is: Inpatient   Remains inpatient appropriate because:IV treatments appropriate due to intensity of illness or inability to take PO and Inpatient level of care appropriate due to severity of illness   Dispo: The patient is from: SNF              Anticipated d/c is to: SNF               Patient currently is medically stable to d/c.  Difficult to place patient YES      Consultants:  None   Procedures:  See below  Antimicrobials:  Anti-infectives (From admission, onward)    Start     Dose/Rate Route Frequency Ordered Stop   02/03/21 2000  DAPTOmycin (CUBICIN) 900 mg in sodium chloride 0.9 % IVPB        900 mg 136 mL/hr over 30 Minutes Intravenous  Daily 02/03/21 1516     02/02/21 1410  vancomycin variable dose per unstable renal function (pharmacist dosing)  Status:  Discontinued         Does not apply See admin instructions 02/02/21 1410 02/03/21 1516   02/02/21 1400  rifampin (RIFADIN) capsule 300 mg        300 mg Oral 3 times daily 02/02/21 1106     02/02/21 1330  amoxicillin-clavulanate (AUGMENTIN) 875-125 MG per tablet 1 tablet        1 tablet Oral Every 12 hours 02/02/21 1238 02/07/21 0959   02/02/21 1000  rifampin (RIFADIN) capsule 450 mg  Status:  Discontinued        450 mg Oral Every 12 hours 02/02/21 0701 02/02/21 1106   02/02/21 0830  vancomycin (VANCOREADY) IVPB 2000 mg/400 mL        2,000 mg 200 mL/hr over 120 Minutes Intravenous  Once 02/02/21 0803 02/02/21 1547   01/31/21 0330  ceFEPIme (MAXIPIME) 2 g in sodium chloride 0.9 % 100 mL IVPB  Status:  Discontinued        2 g 200 mL/hr over 30 Minutes Intravenous Every 12 hours 01/17/2021 2214 02/02/21 0701   01/31/21 0030  linezolid (ZYVOX) tablet 600 mg  Status:  Discontinued        600 mg Oral Every 12 hours 01/20/2021 2344 02/02/21 0701   01/24/2021 2330  vancomycin (VANCOREADY) IVPB 1500 mg/300 mL  Status:  Discontinued       See Hyperspace for full Linked Orders Report.   1,500 mg 150 mL/hr over 120 Minutes Intravenous  Once 01/15/2021 2148 02/04/2021 2151   02/02/2021 2230  vancomycin (VANCOCIN) IVPB 1000 mg/200 mL premix  Status:  Discontinued       See Hyperspace for full Linked Orders Report.   1,000 mg 200 mL/hr over 60 Minutes Intravenous  Once 01/20/2021 2148 01/17/2021 2151   01/10/2021 2145  vancomycin (VANCOCIN) IVPB 1000 mg/200 mL premix  Status:  Discontinued        1,000 mg 200 mL/hr over 60 Minutes Intravenous  Once 01/24/2021 2140 02/04/2021 2145   01/15/2021 2145  ceFEPIme (MAXIPIME) 2 g in sodium chloride 0.9 % 100 mL IVPB  Status:  Discontinued        2 g 200 mL/hr over 30 Minutes Intravenous  Once 01/26/2021 2140 01/12/2021 2212   02/02/2021 1830  aztreonam (AZACTAM) 2 g in  sodium chloride 0.9 % 100 mL IVPB        2 g 200 mL/hr over 30 Minutes Intravenous  Once 02/02/2021 1821 01/25/2021 2034      Subjective: He didn't sleep well but otherwise no complaints.    Objective: Vitals:   02/06/21 0500 02/06/21 0535 02/06/21 1153 02/06/21 1232  BP:  135/65    Pulse:  89    Resp:  18    Temp:  98.5 F (36.9 C) (!) 101.7 F (38.7 C)   TempSrc:  Oral Oral   SpO2:  98% 96% 94%  Weight: (!) 181.7 kg     Height:  Intake/Output Summary (Last 24 hours) at 02/06/2021 1327 Last data filed at 02/06/2021 0900 Gross per 24 hour  Intake 440 ml  Output 2050 ml  Net -1610 ml   Filed Weights   02/03/21 0713 02/03/21 1400 02/06/21 0500  Weight: 109.3 kg (!) 163.9 kg (!) 181.7 kg   Examination:  General exam: NAD. Cooperative.  Appears calm and comfortable, morbidly obese Respiratory system: Clear to auscultation. Respiratory effort normal.  Currently on room air. Cardiovascular system: normal S1 & S2 heard.  Gastrointestinal system: Abdomen is obese, normal BS, no HSM, soft GU: edematous scrotum, erythematous skin, no weeping seen.  Central nervous system: Alert and awake Extremities: Chronic venous stasis changes, with scattered erythema. Scarring of right knee. Skin: No significant lesions noted Psychiatry: normal affect.   Data Reviewed: I have personally reviewed following labs and imaging studies  CBC: Recent Labs  Lab 01/27/2021 1715 01/31/21 0327 02/01/21 0401 02/02/21 0455 02/03/21 0536 02/04/21 0500 02/06/21 1227  WBC 11.4*   < > 8.6 7.5 5.6 7.3 9.3  NEUTROABS 9.0*  --   --   --   --   --  7.9*  HGB 8.9*   < > 8.4* 8.4* 8.7* 9.2* 10.3*  HCT 28.8*   < > 26.9* 27.4* 27.6* 30.3* 33.5*  MCV 93.2   < > 94.1 92.6 94.2 91.8 92.0  PLT 114*   < > 90* 82* 71* 87* 110*   < > = values in this interval not displayed.   Basic Metabolic Panel: Recent Labs  Lab 01/31/21 0327 02/01/21 0401 02/02/21 0455 02/03/21 0536 02/04/21 0500  NA 135 132*  133* 134* 134*  K 3.9 3.6 3.5 3.5 3.5  CL 103 101 102 102 103  CO2 '25 27 25 25 23  '$ GLUCOSE 100* 155* 118* 111* 130*  BUN 24* 27* 31* 32* 31*  CREATININE 2.73* 2.77* 3.08* 3.06* 2.71*  CALCIUM 7.5* 7.2* 7.4* 7.4* 7.7*  MG 1.7 1.9 1.9 2.1 2.1  PHOS 3.8  --   --   --   --    GFR: Estimated Creatinine Clearance: 43.2 mL/min (A) (by C-G formula based on SCr of 2.71 mg/dL (H)). Liver Function Tests: Recent Labs  Lab 01/31/21 0327  AST 33  ALT 21  ALKPHOS 91  BILITOT 1.2  PROT 6.1*  ALBUMIN 1.8*   No results for input(s): LIPASE, AMYLASE in the last 168 hours. No results for input(s): AMMONIA in the last 168 hours. Coagulation Profile: Recent Labs  Lab 01/31/21 0327  INR 1.7*   Cardiac Enzymes: Recent Labs  Lab 02/03/21 0536  CKTOTAL 32*   BNP (last 3 results) No results for input(s): PROBNP in the last 8760 hours. HbA1C: No results for input(s): HGBA1C in the last 72 hours. CBG: Recent Labs  Lab 01/31/21 0234  GLUCAP 89   Lipid Profile: No results for input(s): CHOL, HDL, LDLCALC, TRIG, CHOLHDL, LDLDIRECT in the last 72 hours. Thyroid Function Tests: No results for input(s): TSH, T4TOTAL, FREET4, T3FREE, THYROIDAB in the last 72 hours. Anemia Panel: No results for input(s): VITAMINB12, FOLATE, FERRITIN, TIBC, IRON, RETICCTPCT in the last 72 hours. Sepsis Labs: Recent Labs  Lab 01/31/21 0327  PROCALCITON 33.06  LATICACIDVEN 1.7    Recent Results (from the past 240 hour(s))  Culture, blood (single) w Reflex to ID Panel     Status: None   Collection Time: 01/11/2021  5:15 PM   Specimen: BLOOD LEFT HAND  Result Value Ref Range Status   Specimen Description  BLOOD LEFT HAND  Final   Special Requests   Final    BOTTLES DRAWN AEROBIC AND ANAEROBIC Blood Culture results may not be optimal due to an excessive volume of blood received in culture bottles   Culture   Final    NO GROWTH 5 DAYS Performed at [facility], [address], [place], [place] [address]     Report Status 02/04/2021 FINAL  Final  Urine Culture     Status: None   Collection Time: 01/09/2021  5:40 PM   Specimen: In/Out Cath Urine  Result Value Ref Range Status   Specimen Description   Final    IN/OUT CATH URINE Performed at [facility], [address], [place], [place] [address]    Special Requests   Final    NONE Performed at [facility], [address], [place], [place] [address]    Culture   Final    NO GROWTH Performed at [facility], [place] [address] [place], [place] [address]    Report Status 02/01/2021 FINAL  Final  Resp Panel by RT-PCR (Flu A&B, Covid) Nasopharyngeal Swab     Status: None   Collection Time: 01/12/2021  5:45 PM   Specimen: Nasopharyngeal Swab; Nasopharyngeal(NP) swabs in vial transport medium  Result Value Ref Range Status   SARS Coronavirus 2 by RT PCR NEGATIVE NEGATIVE Final    Comment: (NOTE) SARS-CoV-2 target nucleic acids are NOT DETECTED.  The SARS-CoV-2 RNA is generally detectable in upper respiratory specimens during the acute phase of infection. The lowest concentration of SARS-CoV-2 viral copies this assay can detect is 138 copies/mL. A negative result does not preclude SARS-Cov-2 infection and should not be used as the sole basis for treatment or other patient management decisions. A negative result may occur with  improper specimen collection/handling, submission of specimen other than nasopharyngeal swab, presence of viral mutation(s) within the areas targeted by this assay, and inadequate number of viral copies(<138 copies/mL). A negative result must be combined with clinical observations, patient history, and epidemiological information. The expected result is Negative.  Fact Sheet for Patients:  EntrepreneurPulse.com.au  Fact Sheet for Healthcare Providers:  IncredibleEmployment.be  This test is no t yet approved or cleared by the [place] FDA and  has been authorized for  detection and/or diagnosis of SARS-CoV-2 by FDA under an Emergency Use Authorization (EUA). This EUA will remain  in effect (meaning this test can be used) for the duration of the COVID-19 declaration under Section 564(b)(1) of the Act, 21 U.S.C.section 360bbb-3(b)(1), unless the authorization is terminated  or revoked sooner.       Influenza A by PCR NEGATIVE NEGATIVE Final   Influenza B by PCR NEGATIVE NEGATIVE Final    Comment: (NOTE) The Xpert Xpress SARS-CoV-2/FLU/RSV plus assay is intended as an aid in the diagnosis of influenza from Nasopharyngeal swab specimens and should not be used as a sole basis for treatment. Nasal washings and aspirates are unacceptable for Xpert Xpress SARS-CoV-2/FLU/RSV testing.  Fact Sheet for Patients: EntrepreneurPulse.com.au  Fact Sheet for Healthcare Providers: IncredibleEmployment.be  This test is not yet approved or cleared by the [place] FDA and has been authorized for detection and/or diagnosis of SARS-CoV-2 by FDA under an Emergency Use Authorization (EUA). This EUA will remain in effect (meaning this test can be used) for the duration of the COVID-19 declaration under Section 564(b)(1) of the Act, 21 U.S.C. section 360bbb-3(b)(1), unless the authorization is terminated or revoked.  Performed at [facility], [phone]  [address], [place], [place] [address]   MRSA Next Gen by PCR, Nasal     Status: None   Collection Time: 02/05/2021 11:44 PM   Specimen: Nasal Mucosa; Nasal Swab  Result Value Ref Range Status   MRSA by PCR Next Gen NOT DETECTED NOT DETECTED Final    Comment: (NOTE) The GeneXpert MRSA Assay (FDA approved for NASAL specimens only), is one component of a comprehensive MRSA colonization surveillance program. It is not intended to diagnose MRSA infection nor to guide or monitor treatment for MRSA infections. Test performance is not FDA approved in patients less than 60  years old. Performed at [facility], [address], [place], [place] [address]   Culture, blood (routine x 2)     Status: None (Preliminary result)   Collection Time: 02/06/21 12:27 PM   Specimen: Right Antecubital; Blood  Result Value Ref Range Status   Specimen Description   Final    RIGHT ANTECUBITAL BOTTLES DRAWN AEROBIC AND ANAEROBIC   Special Requests   Final    Blood Culture adequate volume Performed at [facility], [address] [place], [place] [address]    Culture PENDING  Incomplete   Report Status PENDING  Incomplete  Culture, blood (routine x 2)     Status: None (Preliminary result)   Collection Time: 02/06/21 12:27 PM   Specimen: BLOOD RIGHT HAND  Result Value Ref Range Status   Specimen Description BLOOD RIGHT HAND BOTTLES DRAWN AEROBIC ONLY  Final   Special Requests   Final    Blood Culture adequate volume Performed at [facility], [address] [place], [place] [address]    Culture PENDING  Incomplete   Report Status PENDING  Incomplete   Radiology Studies: DG CHEST PORT 1 VIEW  Result Date: 02/06/2021 CLINICAL DATA:  Fever, productive cough EXAM: PORTABLE CHEST 1 VIEW COMPARISON:  Portable exam 1214 hours compared to 01/10/2021 FINDINGS: LEFT arm PICC line tip projects over SVC. Enlargement of cardiac silhouette with pulmonary vascular congestion. Improved pulmonary edema since previous exam. No definite segmental consolidation, pleural effusion, or pneumothorax. Bones demineralized. IMPRESSION: Enlargement of cardiac silhouette with pulmonary vascular congestion. Improved pulmonary edema. No definite acute abnormalities. Electronically Signed   By: Lavonia Dana M.D.   On: 02/06/2021 12:44     Scheduled Meds:  amoxicillin-clavulanate  1 tablet Oral Q12H   Chlorhexidine Gluconate Cloth  6 each Topical Daily   finasteride  5 mg Oral Daily   fluticasone  1 spray Each Nare Daily   folic acid  1 mg Oral Daily   furosemide  40 mg Oral Daily   gabapentin  100  mg Oral BID   nystatin   Topical BID   rifampin  300 mg Oral TID   saccharomyces boulardii  250 mg Oral BID   sodium chloride flush  3 mL Intravenous Q8H   tamsulosin  0.4 mg Oral QHS   topiramate  100 mg Oral Daily    LOS: 7 days   Time spent: 35 minutes Bradie Sangiovanni Wynetta Emery, MD [facility]  If 7PM-7AM, please contact night-coverage [email or web address] 02/06/2021, 1:27 PM

## 2021-02-06 NOTE — Progress Notes (Signed)
Physical Therapy Treatment Patient Details Name: Eddie Fletcher MRN: [id number] DOB: [birth year] Today's Date: 02/06/2021   History of Present Illness Eddie Fletcher is a 65 y.o. male with medical history significant for hypertension, chronic back pain, multiple falls, sleep apnea, morbid obesity, BPH who presents to the emergency department from [facility] via EMS due to 1 day onset of fever and was also noted to be somewhat more somnolent.  He is being actively treated with IV vancomycin for cellulitis of the scrotum and legs and the concern was his febrile episode as well as some hypoxemia.    PT Comments    Patient requires min/mod assist for mobility today secondary to weakness. He competes seated exercises demonstrating good sitting balance and tolerance. Patient does demonstrate ability to stand and take several small lateral steps at bedside today with use of RW and assist. Patient limited by fatigue and is assisted back to bed at end of session.     Recommendations for follow up therapy are one component of a multi-disciplinary discharge planning process, led by the attending physician.  Recommendations may be updated based on patient status, additional functional criteria and insurance authorization.  Follow Up Recommendations  SNF     Equipment Recommendations  None recommended by PT    Recommendations for Other Services       Precautions / Restrictions Precautions Precautions: Fall Restrictions Weight Bearing Restrictions: No     Mobility  Bed Mobility Overal bed mobility: Needs Assistance Bed Mobility: Supine to Sit;Sit to Supine     Supine to sit: Mod assist;HOB elevated Sit to supine: Mod assist   General bed mobility comments: had difficulty sitting up at bedside requiring repeated attempts and use of bed rail with HOB rasied    Transfers Overall transfer level: Needs assistance Equipment used: Rolling walker (2 wheeled) Transfers: Sit to/from Stand Sit  to Stand: Min assist;Mod assist         General transfer comment: min/mod assist to power up with RW  Ambulation/Gait Ambulation/Gait assistance: Min assist;Mod assist Gait Distance (Feet): 1 Feet Assistive device: Rolling walker (2 wheeled) Gait Pattern/deviations: Shuffle Gait velocity: decreased   General Gait Details: limited to small shuffled lateral steps at bedside   Stairs             Wheelchair Mobility    Modified Rankin (Stroke Patients Only)       Balance Overall balance assessment: Needs assistance Sitting-balance support: No upper extremity supported;Feet supported Sitting balance-Leahy Scale: Good Sitting balance - Comments: seated at EOB   Standing balance support: Bilateral upper extremity supported;During functional activity Standing balance-Leahy Scale: Poor Standing balance comment: using RW                            Cognition Arousal/Alertness: Awake/alert Behavior During Therapy: WFL for tasks assessed/performed Overall Cognitive Status: Within Functional Limits for tasks assessed                                        Exercises General Exercises - Lower Extremity Long Arc Quad: AROM;Both;20 reps;Seated Toe Raises: AROM;Both;20 reps;Seated Heel Raises: AROM;Both;20 reps;Seated    General Comments        Pertinent Vitals/Pain Faces Pain Scale: Hurts little more Pain Location: back Pain Descriptors / Indicators: Sharp Pain Intervention(s): Limited activity within patient's tolerance;Monitored during session;Repositioned  Home Living                      Prior Function            PT Goals (current goals can now be found in the care plan section) Acute Rehab PT Goals Patient Stated Goal: return home after rehab Potential to Achieve Goals: Good Progress towards PT goals: Progressing toward goals    Frequency    Min 3X/week      PT Plan Current plan remains appropriate     Co-evaluation              AM-PAC PT "6 Clicks" Mobility   Outcome Measure  Help needed turning from your back to your side while in a flat bed without using bedrails?: A Little Help needed moving from lying on your back to sitting on the side of a flat bed without using bedrails?: A Lot Help needed moving to and from a bed to a chair (including a wheelchair)?: A Lot Help needed standing up from a chair using your arms (e.g., wheelchair or bedside chair)?: A Lot Help needed to walk in hospital room?: Total Help needed climbing 3-5 steps with a railing? : Total 6 Click Score: 11    End of Session Equipment Utilized During Treatment: Gait belt Activity Tolerance: Patient tolerated treatment well;Patient limited by fatigue Patient left: in bed;with call bell/phone within reach;with bed alarm set Nurse Communication: Mobility status PT Visit Diagnosis: Difficulty in walking, not elsewhere classified (R26.2);Muscle weakness (generalized) (M62.81);Unsteadiness on feet (R26.81)     Time: 1131-1150 PT Time Calculation (min) (ACUTE ONLY): 19 min  Charges:  $Therapeutic Exercise: 8-22 mins                     12:30 PM, 02/06/21 Mearl Latin PT, DPT Physical Therapist at [facility]

## 2021-02-06 NOTE — Progress Notes (Signed)
   02/06/21 1833  Assess: MEWS Score  Temp 99.3 F (37.4 C)  BP (!) 95/41  Pulse Rate 99  Resp 18  SpO2 95 %  O2 Device Room Air  Assess: MEWS Score  MEWS Temp 0  MEWS Systolic 1  MEWS Pulse 0  MEWS RR 0  MEWS LOC 0  MEWS Score 1  MEWS Score Color Green  Assess: if the MEWS score is Yellow or Red  Were vital signs taken at a resting state? Yes  Focused Assessment Change from prior assessment (see assessment flowsheet) (improved)  Treat  Pain Score 0  Notify: Charge Nurse/RN  Name of Charge Nurse/RN Notified Frazier Butt  Date Charge Nurse/RN Notified 02/06/21  Time Charge Nurse/RN Notified [id number]  Notify: Provider  Provider Name/Title Dr. Wynetta Emery  Date Provider Notified 02/06/21  Time Provider Notified [phone]  Notification Type Page  Document  Patient Outcome Stabilized after interventions  Progress note created (see row info) Yes

## 2021-02-06 NOTE — Progress Notes (Signed)
RN called RT in regards to pt with low sats.  Pt was on RA with sats of 88%, O2 turned on to 3L with sats 93-95%.  Pt noted to have a fever.  RN at bedside obtaining vitals.  RT will continue to monitor.

## 2021-02-07 DIAGNOSIS — L03313 Cellulitis of chest wall: Secondary | ICD-10-CM | POA: Diagnosis not present

## 2021-02-07 LAB — C-REACTIVE PROTEIN: CRP: 9.2 mg/dL — ABNORMAL HIGH (ref <1.0)

## 2021-02-07 LAB — COMPREHENSIVE METABOLIC PANEL
ALT: 17 U/L (ref 0–44)
AST: 22 U/L (ref 15–41)
Albumin: 1.7 g/dL — ABNORMAL LOW (ref 3.5–5.0)
Alkaline Phosphatase: 99 U/L (ref 38–126)
Anion gap: 6 (ref 5–15)
BUN: 30 mg/dL — ABNORMAL HIGH (ref 8–23)
CO2: 25 mmol/L (ref 22–32)
Calcium: 7.5 mg/dL — ABNORMAL LOW (ref 8.9–10.3)
Chloride: 103 mmol/L (ref 98–111)
Creatinine, Ser: 2.57 mg/dL — ABNORMAL HIGH (ref 0.61–1.24)
GFR, Estimated: 27 mL/min — ABNORMAL LOW (ref >60)
Glucose, Bld: 108 mg/dL — ABNORMAL HIGH (ref 70–99)
Potassium: 3.8 mmol/L (ref 3.5–5.1)
Sodium: 134 mmol/L — ABNORMAL LOW (ref 135–145)
Total Bilirubin: 1.5 mg/dL — ABNORMAL HIGH (ref 0.3–1.2)
Total Protein: 6.3 g/dL — ABNORMAL LOW (ref 6.5–8.1)

## 2021-02-07 LAB — CBC WITH DIFFERENTIAL/PLATELET
Abs Immature Granulocytes: 0.12 10*3/uL — ABNORMAL HIGH (ref 0.00–0.07)
Basophils Absolute: 0 10*3/uL (ref 0.0–0.1)
Basophils Relative: 0 %
Eosinophils Absolute: 0 10*3/uL (ref 0.0–0.5)
Eosinophils Relative: 0 %
HCT: 27.1 % — ABNORMAL LOW (ref 39.0–52.0)
Hemoglobin: 8.3 g/dL — ABNORMAL LOW (ref 13.0–17.0)
Immature Granulocytes: 1 %
Lymphocytes Relative: 5 %
Lymphs Abs: 0.5 10*3/uL — ABNORMAL LOW (ref 0.7–4.0)
MCH: 28.6 pg (ref 26.0–34.0)
MCHC: 30.6 g/dL (ref 30.0–36.0)
MCV: 93.4 fL (ref 80.0–100.0)
Monocytes Absolute: 1.4 10*3/uL — ABNORMAL HIGH (ref 0.1–1.0)
Monocytes Relative: 14 %
Neutro Abs: 8 10*3/uL — ABNORMAL HIGH (ref 1.7–7.7)
Neutrophils Relative %: 80 %
Platelets: 73 10*3/uL — ABNORMAL LOW (ref 150–400)
RBC: 2.9 MIL/uL — ABNORMAL LOW (ref 4.22–5.81)
RDW: 16.4 % — ABNORMAL HIGH (ref 11.5–15.5)
WBC: 10 10*3/uL (ref 4.0–10.5)
nRBC: 0 % (ref 0.0–0.2)

## 2021-02-07 LAB — PROCALCITONIN: Procalcitonin: 7.39 ng/mL

## 2021-02-07 MED ORDER — CHLORHEXIDINE GLUCONATE CLOTH 2 % EX PADS
6.0000 | MEDICATED_PAD | Freq: Every day | CUTANEOUS | Status: DC
  Administered 2021-02-07: 2 via TOPICAL
  Administered 2021-02-08 – 2021-02-18 (×10): 6 via TOPICAL

## 2021-02-07 NOTE — Progress Notes (Signed)
Pt alert and oriented x4, very talkative. Pt currently on 2 lpm [place]. Breath sounds diminished bilaterally. Pt explaining to this nurse why he can't walk (due to nerve damage in his back). Pt then states, "Oh, I can't breathe!", clutching chest and coughing. Pt able to slide self up in bed, SaO reads 98%, resp 18/min, HR 104. Another staff member in to room (due to bed alarm sounding when pt moved self in bed), and pt immediately began smiling and talking with staff. No further c/o SOB. Breakfast tray delivered and pt set his own tray up and began eating.

## 2021-02-07 NOTE — Plan of Care (Signed)
Pt has developed new skin abrasions to left inner thigh and left medial buttocks.  Problem: Education: Goal: Knowledge of General Education information will improve Description: Including pain rating scale, medication(s)/side effects and non-pharmacologic comfort measures 02/07/2021 1857 by Angelyn Punt, RN Outcome: Progressing 02/07/2021 1857 by Angelyn Punt, RN Outcome: Progressing   Problem: Health Behavior/Discharge Planning: Goal: Ability to manage health-related needs will improve 02/07/2021 1857 by Angelyn Punt, RN Outcome: Progressing 02/07/2021 1857 by Angelyn Punt, RN Outcome: Progressing   Problem: Clinical Measurements: Goal: Ability to maintain clinical measurements within normal limits will improve 02/07/2021 1857 by Angelyn Punt, RN Outcome: Progressing 02/07/2021 1857 by Angelyn Punt, RN Outcome: Progressing Goal: Will remain free from infection 02/07/2021 1857 by Angelyn Punt, RN Outcome: Progressing 02/07/2021 1857 by Angelyn Punt, RN Outcome: Progressing Goal: Diagnostic test results will improve 02/07/2021 1857 by Angelyn Punt, RN Outcome: Progressing 02/07/2021 1857 by Angelyn Punt, RN Outcome: Progressing Goal: Respiratory complications will improve 02/07/2021 1857 by Angelyn Punt, RN Outcome: Progressing 02/07/2021 1857 by Angelyn Punt, RN Outcome: Progressing Goal: Cardiovascular complication will be avoided 02/07/2021 1857 by Angelyn Punt, RN Outcome: Progressing 02/07/2021 1857 by Angelyn Punt, RN Outcome: Progressing   Problem: Activity: Goal: Risk for activity intolerance will decrease 02/07/2021 1857 by Angelyn Punt, RN Outcome: Progressing 02/07/2021 1857 by Angelyn Punt, RN Outcome: Progressing   Problem: Nutrition: Goal: Adequate nutrition will be maintained 02/07/2021 1857 by Angelyn Punt, RN Outcome: Progressing 02/07/2021 1857  by Angelyn Punt, RN Outcome: Progressing   Problem: Coping: Goal: Level of anxiety will decrease 02/07/2021 1857 by Angelyn Punt, RN Outcome: Progressing 02/07/2021 1857 by Angelyn Punt, RN Outcome: Progressing   Problem: Elimination: Goal: Will not experience complications related to bowel motility 02/07/2021 1857 by Angelyn Punt, RN Outcome: Progressing 02/07/2021 1857 by Angelyn Punt, RN Outcome: Progressing Goal: Will not experience complications related to urinary retention 02/07/2021 1857 by Angelyn Punt, RN Outcome: Progressing 02/07/2021 1857 by Angelyn Punt, RN Outcome: Progressing   Problem: Pain Managment: Goal: General experience of comfort will improve 02/07/2021 1857 by Angelyn Punt, RN Outcome: Progressing 02/07/2021 1857 by Angelyn Punt, RN Outcome: Progressing   Problem: Safety: Goal: Ability to remain free from injury will improve 02/07/2021 1857 by Angelyn Punt, RN Outcome: Progressing 02/07/2021 1857 by Angelyn Punt, RN Outcome: Progressing

## 2021-02-07 NOTE — Progress Notes (Signed)
PROGRESS NOTE    Eddie Fletcher DOB: [birth year] DOA: 01/29/2021 PCP: Curly Rim, MD  Brief Narrative:   Eddie Fletcher is a 65 y.o. Fletcher with medical history significant for hypertension, chronic back pain, multiple falls, sleep apnea, morbid obesity, BPH who presents to the emergency department from [facility] via EMS due to 1 day onset of fever and was also noted to be somewhat more somnolent.  He is being actively treated with IV vancomycin for cellulitis of the scrotum and legs and the concern was his febrile episode as well as some hypoxemia which is related to pneumonia.  He is actively being treated with antibiotics as noted below and is awaiting placement to new SNF if possible.  Insurance authorization pending as well.  Assessment & Plan:   Principal Problem:   Cellulitis of chest wall Active Problems:   Obstructive sleep apnea   Chronic low back pain   Morbid obesity with BMI of 60.0-69.9, adult (HCC)   Multiple falls   Elevated brain natriuretic peptide (BNP) level   Pulmonary edema   Splenomegaly, not elsewhere classified   Ascites   Chronic venous stasis dermatitis of both lower extremities   Chronic kidney disease   Acute febrile illness   Acute respiratory failure with hypoxia (HCC)   Cellulitis of scrotum   Acute fever with hypoxemia likely related to new pneumonia-improved -In the setting of current scrotal cellulitis with right knee prosthetic infection -Continues currently on vancomycin as well as rifampin as recommended for 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 for total 6 weeks which began on 9/7; being followed by ID Dr. Layne Benton at [facility] for pneumonia for total 7-day course of treatment, currently day 7/7 -Patient has PICC line  Scrotal cellulitis - Edema markedly improved - Erythema markedly improved  Fever 101 -  blood cultures x 2 NGTD, chest x ray no acute findings, CBC diff WNL, UA WNL    - continue current antibiotics therapy   Acute hypoxemic respiratory failure secondary to pulmonary edema-resolved -Not currently hypoxemic or tachypneic and edema appears to be near baseline -Started IV Lasix 9/25 due to improvement in blood pressure readings, but no further need for diuretics noted -Continue to monitor carefully in light of creatinine elevation -Repeat 2D echocardiogram on 9/24 with LVEF 65-70%   Splenomegaly and ascites -Appears stable and near baseline -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 and moderate splenomegaly noted on CT scan -Continue to monitor   AKI on CKD stage IV -resumeds home diuretics lasix 20 mg daily -Discontinue further IV fluid and monitor  -Baseline creatinine 1.8-2.5 -Continue to monitor  Mild hyponatremia - from diuresis and volume overload -Currently stable, continue to monitor -Consider further testing as needed   Chronic venous stasis dermatitis -Stable   OSA -Refuses CPAP   Chronic low back pain/multiple falls -Pain medications as needed -Fall precautions   Thrombocytopenia -Likely related to splenomegaly, possibly due to rifampin, following -Hold further Lovenox and maintain on SCDs -Monitor repeat CBC to trend   Morbid obesity -Lifestyle changes outpatient     DVT prophylaxis:SCDs Code Status: Full Family Communication: Wife on phone 9/25, 9/29 Disposition Plan: awaiting for insurance authorization for SNF placement Status is: Inpatient   Remains inpatient appropriate because:IV treatments appropriate due to intensity of illness or inability to take PO and Inpatient level of care appropriate due to severity of illness   Dispo: The patient is from: SNF  Anticipated d/c is to: SNF               Patient currently is medically stable to d/c.              Difficult to place patient YES      Consultants:  None   Procedures:  See below  Antimicrobials:  Anti-infectives (From  admission, onward)    Start     Dose/Rate Route Frequency Ordered Stop   02/03/21 2000  DAPTOmycin (CUBICIN) 900 mg in sodium chloride 0.9 % IVPB        900 mg 136 mL/hr over 30 Minutes Intravenous Daily 02/03/21 1516     02/02/21 1410  vancomycin variable dose per unstable renal function (pharmacist dosing)  Status:  Discontinued         Does not apply See admin instructions 02/02/21 1410 02/03/21 1516   02/02/21 1400  rifampin (RIFADIN) capsule 300 mg        300 mg Oral 3 times daily 02/02/21 1106     02/02/21 1330  amoxicillin-clavulanate (AUGMENTIN) 875-125 MG per tablet 1 tablet        1 tablet Oral Every 12 hours 02/02/21 1238 02/06/21 2026   02/02/21 1000  rifampin (RIFADIN) capsule 450 mg  Status:  Discontinued        450 mg Oral Every 12 hours 02/02/21 0701 02/02/21 1106   02/02/21 0830  vancomycin (VANCOREADY) IVPB 2000 mg/400 mL        2,000 mg 200 mL/hr over 120 Minutes Intravenous  Once 02/02/21 0803 02/02/21 1547   01/31/21 0330  ceFEPIme (MAXIPIME) 2 g in sodium chloride 0.9 % 100 mL IVPB  Status:  Discontinued        2 g 200 mL/hr over 30 Minutes Intravenous Every 12 hours 02/01/2021 2214 02/02/21 0701   01/31/21 0030  linezolid (ZYVOX) tablet 600 mg  Status:  Discontinued        600 mg Oral Every 12 hours 01/31/2021 2344 02/02/21 0701   01/15/2021 2330  vancomycin (VANCOREADY) IVPB 1500 mg/300 mL  Status:  Discontinued       See Hyperspace for full Linked Orders Report.   1,500 mg 150 mL/hr over 120 Minutes Intravenous  Once 01/18/2021 2148 02/05/2021 2151   01/17/2021 2230  vancomycin (VANCOCIN) IVPB 1000 mg/200 mL premix  Status:  Discontinued       See Hyperspace for full Linked Orders Report.   1,000 mg 200 mL/hr over 60 Minutes Intravenous  Once 01/10/2021 2148 01/31/2021 2151   01/14/2021 2145  vancomycin (VANCOCIN) IVPB 1000 mg/200 mL premix  Status:  Discontinued        1,000 mg 200 mL/hr over 60 Minutes Intravenous  Once 01/19/2021 2140 01/11/2021 2145   01/25/2021 2145  ceFEPIme  (MAXIPIME) 2 g in sodium chloride 0.9 % 100 mL IVPB  Status:  Discontinued        2 g 200 mL/hr over 30 Minutes Intravenous  Once 01/25/2021 2140 02/03/2021 2212   01/15/2021 1830  aztreonam (AZACTAM) 2 g in sodium chloride 0.9 % 100 mL IVPB        2 g 200 mL/hr over 30 Minutes Intravenous  Once 01/17/2021 1821 01/20/2021 2034      Subjective: Pt reports that he is feeling better today than yesterday.    Objective: Vitals:   02/07/21 0755 02/07/21 0849 02/07/21 1051 02/07/21 1312  BP:   (!) 116/55 (!) 120/46  Pulse: (!) 104  100 91  Resp: 20  20 20  Temp:   98.4 F (36.9 C) 99.2 F (37.3 C)  TempSrc:   Oral Oral  SpO2: 98% 96% 98% 97%  Weight:      Height:        Intake/Output Summary (Last 24 hours) at 02/07/2021 1504 Last data filed at 02/07/2021 1300 Gross per 24 hour  Intake 203 ml  Output --  Net 203 ml   Filed Weights   02/03/21 1400 02/06/21 0500 02/07/21 0500  Weight: (!) 163.9 kg (!) 181.7 kg (!) 183.8 kg   Examination: Full body exam completed today 10/1 General exam: NAD. Cooperative.  Appears calm and comfortable, morbidly obese Respiratory system: Clear to auscultation. Respiratory effort normal.  Currently on room air. Cardiovascular system: normal S1 & S2 heard.  Gastrointestinal system: Abdomen is obese, normal BS, no HSM, soft GU: edematous scrotum, erythematous skin, no weeping seen.  Central nervous system: Alert and awake Extremities: Chronic venous stasis changes, with scattered erythema. Scarring of right knee. Skin: No significant lesions noted Psychiatry: normal affect.   Data Reviewed: I have personally reviewed following labs and imaging studies  CBC: Recent Labs  Lab 02/02/21 0455 02/03/21 0536 02/04/21 0500 02/06/21 1227 02/07/21 0515  WBC 7.5 5.6 7.3 9.3 10.0  NEUTROABS  --   --   --  7.9* 8.0*  HGB 8.4* 8.7* 9.2* 10.3* 8.3*  HCT 27.4* 27.6* 30.3* 33.5* 27.1*  MCV 92.6 94.2 91.8 92.0 93.4  PLT 82* 71* 87* 110* 73*   Basic Metabolic  Panel: Recent Labs  Lab 02/01/21 0401 02/02/21 0455 02/03/21 0536 02/04/21 0500 02/07/21 0515  NA 132* 133* 134* 134* 134*  K 3.6 3.5 3.5 3.5 3.8  CL 101 102 102 103 103  CO2 '27 25 25 23 25  '$ GLUCOSE 155* 118* 111* 130* 108*  BUN 27* 31* 32* 31* 30*  CREATININE 2.77* 3.08* 3.06* 2.71* 2.57*  CALCIUM 7.2* 7.4* 7.4* 7.7* 7.5*  MG 1.9 1.9 2.1 2.1  --    GFR: Estimated Creatinine Clearance: 45.9 mL/min (A) (by C-G formula based on SCr of 2.57 mg/dL (H)). Liver Function Tests: Recent Labs  Lab 02/07/21 0515  AST 22  ALT 17  ALKPHOS 99  BILITOT 1.5*  PROT 6.3*  ALBUMIN 1.7*   No results for input(s): LIPASE, AMYLASE in the last 168 hours. No results for input(s): AMMONIA in the last 168 hours. Coagulation Profile: No results for input(s): INR, PROTIME in the last 168 hours.  Cardiac Enzymes: Recent Labs  Lab 02/03/21 0536  CKTOTAL 32*   BNP (last 3 results) No results for input(s): PROBNP in the last 8760 hours. HbA1C: No results for input(s): HGBA1C in the last 72 hours. CBG: No results for input(s): GLUCAP in the last 168 hours.  Lipid Profile: No results for input(s): CHOL, HDL, LDLCALC, TRIG, CHOLHDL, LDLDIRECT in the last 72 hours. Thyroid Function Tests: No results for input(s): TSH, T4TOTAL, FREET4, T3FREE, THYROIDAB in the last 72 hours. Anemia Panel: No results for input(s): VITAMINB12, FOLATE, FERRITIN, TIBC, IRON, RETICCTPCT in the last 72 hours. Sepsis Labs: Recent Labs  Lab 02/06/21 1756 02/07/21 0515  PROCALCITON 5.28 7.39    Recent Results (from the past 240 hour(s))  Culture, blood (single) w Reflex to ID Panel     Status: None   Collection Time: 01/31/2021  5:15 PM   Specimen: BLOOD LEFT HAND  Result Value Ref Range Status   Specimen Description BLOOD LEFT HAND  Final   Special Requests   Final  BOTTLES DRAWN AEROBIC AND ANAEROBIC Blood Culture results may not be optimal due to an excessive volume of blood received in culture bottles    Culture   Final    NO GROWTH 5 DAYS Performed at [facility], [address], [place], [place] [address]    Report Status 02/04/2021 FINAL  Final  Urine Culture     Status: None   Collection Time: 01/13/2021  5:40 PM   Specimen: In/Out Cath Urine  Result Value Ref Range Status   Specimen Description   Final    IN/OUT CATH URINE Performed at [facility], [address], [place], [place] [address]    Special Requests   Final    NONE Performed at [facility], [address], [place], [place] [address]    Culture   Final    NO GROWTH Performed at [facility], [place] [address] [place], [place] [address]    Report Status 02/01/2021 FINAL  Final  Resp Panel by RT-PCR (Flu A&B, Covid) Nasopharyngeal Swab     Status: None   Collection Time: 01/24/2021  5:45 PM   Specimen: Nasopharyngeal Swab; Nasopharyngeal(NP) swabs in vial transport medium  Result Value Ref Range Status   SARS Coronavirus 2 by RT PCR NEGATIVE NEGATIVE Final    Comment: (NOTE) SARS-CoV-2 target nucleic acids are NOT DETECTED.  The SARS-CoV-2 RNA is generally detectable in upper respiratory specimens during the acute phase of infection. The lowest concentration of SARS-CoV-2 viral copies this assay can detect is 138 copies/mL. A negative result does not preclude SARS-Cov-2 infection and should not be used as the sole basis for treatment or other patient management decisions. A negative result may occur with  improper specimen collection/handling, submission of specimen other than nasopharyngeal swab, presence of viral mutation(s) within the areas targeted by this assay, and inadequate number of viral copies(<138 copies/mL). A negative result must be combined with clinical observations, patient history, and epidemiological information. The expected result is Negative.  Fact Sheet for Patients:  EntrepreneurPulse.com.au  Fact Sheet for Healthcare Providers:   IncredibleEmployment.be  This test is no t yet approved or cleared by the [place] FDA and  has been authorized for detection and/or diagnosis of SARS-CoV-2 by FDA under an Emergency Use Authorization (EUA). This EUA will remain  in effect (meaning this test can be used) for the duration of the COVID-19 declaration under Section 564(b)(1) of the Act, 21 U.S.C.section 360bbb-3(b)(1), unless the authorization is terminated  or revoked sooner.       Influenza A by PCR NEGATIVE NEGATIVE Final   Influenza B by PCR NEGATIVE NEGATIVE Final    Comment: (NOTE) The Xpert Xpress SARS-CoV-2/FLU/RSV plus assay is intended as an aid in the diagnosis of influenza from Nasopharyngeal swab specimens and should not be used as a sole basis for treatment. Nasal washings and aspirates are unacceptable for Xpert Xpress SARS-CoV-2/FLU/RSV testing.  Fact Sheet for Patients: EntrepreneurPulse.com.au  Fact Sheet for Healthcare Providers: IncredibleEmployment.be  This test is not yet approved or cleared by the [place] FDA and has been authorized for detection and/or diagnosis of SARS-CoV-2 by FDA under an Emergency Use Authorization (EUA). This EUA will remain in effect (meaning this test can be used) for the duration of the COVID-19 declaration under Section 564(b)(1) of the Act, 21 U.S.C. section 360bbb-3(b)(1), unless the authorization is terminated or revoked.  Performed at [facility], [address] [place], [place] [address]   MRSA Next Gen by PCR, Nasal  Status: None   Collection Time: 01/20/2021 11:44 PM   Specimen: Nasal Mucosa; Nasal Swab  Result Value Ref Range Status   MRSA by PCR Next Gen NOT DETECTED NOT DETECTED Final    Comment: (NOTE) The GeneXpert MRSA Assay (FDA approved for NASAL specimens only), is one component of a comprehensive MRSA colonization surveillance program. It is not intended to diagnose MRSA  infection nor to guide or monitor treatment for MRSA infections. Test performance is not FDA approved in patients less than 22 years old. Performed at [facility], [address], [place], [place] [address]   Culture, blood (routine x 2)     Status: None (Preliminary result)   Collection Time: 02/06/21 12:27 PM   Specimen: Right Antecubital; Blood  Result Value Ref Range Status   Specimen Description   Final    RIGHT ANTECUBITAL BOTTLES DRAWN AEROBIC AND ANAEROBIC   Special Requests Blood Culture adequate volume  Final   Culture   Final    NO GROWTH < 24 HOURS Performed at [facility], [address] [place], [place] [address]    Report Status PENDING  Incomplete  Culture, blood (routine x 2)     Status: None (Preliminary result)   Collection Time: 02/06/21 12:27 PM   Specimen: BLOOD RIGHT HAND  Result Value Ref Range Status   Specimen Description BLOOD RIGHT HAND BOTTLES DRAWN AEROBIC ONLY  Final   Special Requests Blood Culture adequate volume  Final   Culture   Final    NO GROWTH < 24 HOURS Performed at [facility], [address], [place], [place] [address]    Report Status PENDING  Incomplete   Radiology Studies: DG CHEST PORT 1 VIEW  Result Date: 02/06/2021 CLINICAL DATA:  Fever, productive cough EXAM: PORTABLE CHEST 1 VIEW COMPARISON:  Portable exam 1214 hours compared to 02/04/2021 FINDINGS: LEFT arm PICC line tip projects over SVC. Enlargement of cardiac silhouette with pulmonary vascular congestion. Improved pulmonary edema since previous exam. No definite segmental consolidation, pleural effusion, or pneumothorax. Bones demineralized. IMPRESSION: Enlargement of cardiac silhouette with pulmonary vascular congestion. Improved pulmonary edema. No definite acute abnormalities. Electronically Signed   By: Lavonia Dana M.D.   On: 02/06/2021 12:44    Scheduled Meds:  acetaminophen  650 mg Oral Q6H   Or   acetaminophen  650 mg Rectal Q6H   Chlorhexidine Gluconate Cloth  6  each Topical Daily   finasteride  5 mg Oral Daily   fluticasone  1 spray Each Nare Daily   folic acid  1 mg Oral Daily   furosemide  40 mg Oral Daily   gabapentin  100 mg Oral BID   nystatin   Topical BID   rifampin  300 mg Oral TID   saccharomyces boulardii  250 mg Oral BID   sodium chloride flush  3 mL Intravenous Q8H   tamsulosin  0.4 mg Oral QHS   topiramate  100 mg Oral Daily    LOS: 8 days   Time spent: 35 minutes Swan Zayed Wynetta Emery, MD [facility]  If 7PM-7AM, please contact night-coverage [email or web address] 02/07/2021, 3:04 PM

## 2021-02-07 DEATH — deceased

## 2021-02-08 ENCOUNTER — Inpatient Hospital Stay (HOSPITAL_COMMUNITY): Payer: Medicare HMO

## 2021-02-08 ENCOUNTER — Encounter (HOSPITAL_COMMUNITY): Payer: Self-pay | Admitting: Internal Medicine

## 2021-02-08 DIAGNOSIS — B965 Pseudomonas (aeruginosa) (mallei) (pseudomallei) as the cause of diseases classified elsewhere: Secondary | ICD-10-CM

## 2021-02-08 DIAGNOSIS — J9601 Acute respiratory failure with hypoxia: Secondary | ICD-10-CM | POA: Diagnosis not present

## 2021-02-08 DIAGNOSIS — L03313 Cellulitis of chest wall: Secondary | ICD-10-CM | POA: Diagnosis not present

## 2021-02-08 DIAGNOSIS — R509 Fever, unspecified: Secondary | ICD-10-CM | POA: Diagnosis not present

## 2021-02-08 DIAGNOSIS — R7881 Bacteremia: Secondary | ICD-10-CM

## 2021-02-08 LAB — PROCALCITONIN: Procalcitonin: 6.62 ng/mL

## 2021-02-08 LAB — BLOOD CULTURE ID PANEL (REFLEXED) - BCID2

## 2021-02-08 LAB — COMPREHENSIVE METABOLIC PANEL
ALT: 18 U/L (ref 0–44)
AST: 25 U/L (ref 15–41)
Albumin: 2 g/dL — ABNORMAL LOW (ref 3.5–5.0)
Alkaline Phosphatase: 147 U/L — ABNORMAL HIGH (ref 38–126)
Anion gap: 7 (ref 5–15)
BUN: 32 mg/dL — ABNORMAL HIGH (ref 8–23)
CO2: 25 mmol/L (ref 22–32)
Calcium: 7.6 mg/dL — ABNORMAL LOW (ref 8.9–10.3)
Chloride: 101 mmol/L (ref 98–111)
Creatinine, Ser: 2.77 mg/dL — ABNORMAL HIGH (ref 0.61–1.24)
GFR, Estimated: 25 mL/min — ABNORMAL LOW (ref >60)
Glucose, Bld: 142 mg/dL — ABNORMAL HIGH (ref 70–99)
Potassium: 3.4 mmol/L — ABNORMAL LOW (ref 3.5–5.1)
Sodium: 133 mmol/L — ABNORMAL LOW (ref 135–145)
Total Bilirubin: 1.3 mg/dL — ABNORMAL HIGH (ref 0.3–1.2)
Total Protein: 7.1 g/dL (ref 6.5–8.1)

## 2021-02-08 LAB — GLUCOSE, CAPILLARY: Glucose-Capillary: 129 mg/dL — ABNORMAL HIGH (ref 70–99)

## 2021-02-08 LAB — D-DIMER, QUANTITATIVE: D-Dimer, Quant: 10.7 ug/mL-FEU — ABNORMAL HIGH (ref 0.00–0.50)

## 2021-02-08 MED ORDER — TECHNETIUM TO 99M ALBUMIN AGGREGATED
4.0000 | Freq: Once | INTRAVENOUS | Status: AC | PRN
  Administered 2021-02-08: 4.3 via INTRAVENOUS

## 2021-02-08 MED ORDER — SODIUM CHLORIDE 0.9 % IV SOLN
2.0000 g | Freq: Two times a day (BID) | INTRAVENOUS | Status: DC
  Administered 2021-02-08 – 2021-02-18 (×21): 2 g via INTRAVENOUS
  Filled 2021-02-08 (×24): qty 2

## 2021-02-08 MED ORDER — FUROSEMIDE 10 MG/ML IJ SOLN: 20.0000 mg | Freq: Once | INTRAMUSCULAR | Status: DC

## 2021-02-08 MED ORDER — POTASSIUM CHLORIDE CRYS ER 20 MEQ PO TBCR
40.0000 meq | EXTENDED_RELEASE_TABLET | Freq: Once | ORAL | Status: AC
  Administered 2021-02-08: 40 meq via ORAL
  Filled 2021-02-08: qty 2

## 2021-02-08 MED ORDER — PIPERACILLIN-TAZOBACTAM 3.375 G IVPB
3.3750 g | Freq: Three times a day (TID) | INTRAVENOUS | Status: DC
  Administered 2021-02-08: 3.375 g via INTRAVENOUS
  Filled 2021-02-08: qty 50

## 2021-02-08 MED ORDER — PIPERACILLIN-TAZOBACTAM 3.375 G IVPB 30 MIN
3.3750 g | INTRAVENOUS | Status: AC
  Administered 2021-02-08: 3.375 g via INTRAVENOUS
  Filled 2021-02-08 (×2): qty 50

## 2021-02-08 MED ORDER — FUROSEMIDE 10 MG/ML IJ SOLN
20.0000 mg | Freq: Once | INTRAMUSCULAR | Status: AC
  Administered 2021-02-08: 20 mg via INTRAVENOUS
  Filled 2021-02-08: qty 2

## 2021-02-08 NOTE — Progress Notes (Signed)
Patient yelled out, "I can't breathe, coughing". Patient HOH elevated. Oxygen sat 90-92% on RA, resp 20, HR 98. Placed patient on 2 liters via [place]. MD Wynetta Emery made aware.

## 2021-02-08 NOTE — Progress Notes (Addendum)
Pharmacy Antibiotic Note  Eddie Fletcher is a 65 y.o. male admitted on 01/19/2021 with fever, treated w/ Cubicin (initially vanc) and rifampin for cellulitis, prosthesis infection, and PNA, now w/ Pseudomonas bacteremia.  Pharmacy has been consulted for Zosyn dosing.  Plan: Zosyn 3.375g IV q8h (4 hour infusion).  Height: '5\' 6"'$  (167.6 cm) Weight: (!) 183.8 kg (405 lb 3.3 oz) IBW/kg (Calculated) : 63.8  Temp (24hrs), Avg:98.5 F (36.9 C), Min:98 F (36.7 C), Max:99.2 F (37.3 C)  Recent Labs  Lab 02/02/21 0455 02/03/21 0536 02/03/21 1107 02/04/21 0500 02/06/21 1227 02/07/21 0515  WBC 7.5 5.6  --  7.3 9.3 10.0  CREATININE 3.08* 3.06*  --  2.71*  --  2.57*  VANCORANDOM  --   --  20  --   --   --     Estimated Creatinine Clearance: 45.9 mL/min (A) (by C-G formula based on SCr of 2.57 mg/dL (H)).    Allergies  Allergen Reactions   Latex Other (See Comments)    Latex cath caused burning and irritation Latex cath caused burning and irritation    Levaquin [Levofloxacin In D5w] Other (See Comments)    Muscle aches   Keflex [Cephalexin] Rash   Lisinopril Cough   Sulfa Antibiotics Rash    Thank you for allowing pharmacy to be a part of this patient's care.  Wynona Neat, PharmD, BCPS  02/08/2021 5:58 AM

## 2021-02-08 NOTE — TOC Progression Note (Signed)
Transition of Care [facility]) - Progression Note    Patient Details  Name: TREVAN SQUILLANTE MRN: [id number] Date of Birth: 05-Jun-1955  Transition of Care [facility]) CM/SW [place], [place] Phone Number: 02/08/2021, 1:47 PM  Clinical Narrative:    CSW made call to [facility] admissions to inquire about status of pts insurance authorization. There was no answer, TOC to follow up tomorrow.   Expected Discharge Plan: Skilled Nursing Facility Barriers to Discharge: Ship broker  Expected Discharge Plan and Services Expected Discharge Plan: [place] In-house Referral: Clinical Social Work   Post Acute Care Choice: [place] Living arrangements for the past 2 months: [place]                                       Social Determinants of Health (SDOH) Interventions    Readmission Risk Interventions No flowsheet data found.

## 2021-02-08 NOTE — Plan of Care (Signed)
  Problem: Education: Goal: Knowledge of General Education information will improve Description Including pain rating scale, medication(s)/side effects and non-pharmacologic comfort measures Outcome: Progressing   

## 2021-02-08 NOTE — Progress Notes (Signed)
PROGRESS NOTE    Eddie Fletcher  [id number] DOB: 06-06-1955 DOA: 01/28/2021 PCP: Curly Rim, MD  Brief Narrative:   Eddie Fletcher is a 65 y.o. male with medical history significant for hypertension, chronic back pain, multiple falls, sleep apnea, morbid obesity, BPH who presents to the emergency department from [facility] via EMS due to 1 day onset of fever and was also noted to be somewhat more somnolent.  He is being actively treated with IV vancomycin for cellulitis of the scrotum and legs and the concern was his febrile episode as well as some hypoxemia which is related to pneumonia.  He is actively being treated with antibiotics as noted below and is awaiting placement to new SNF if possible.  Insurance authorization pending as well.  Assessment & Plan:   Principal Problem:   Cellulitis of chest wall Active Problems:   Obstructive sleep apnea   Chronic low back pain   Morbid obesity with BMI of 60.0-69.9, adult (HCC)   Multiple falls   Elevated brain natriuretic peptide (BNP) level   Pulmonary edema   Splenomegaly, not elsewhere classified   Ascites   Chronic venous stasis dermatitis of both lower extremities   Chronic kidney disease   Acute febrile illness   Acute respiratory failure with hypoxia (HCC)   Cellulitis of scrotum   Bacteremia due to Pseudomonas   Acute fever with hypoxemia likely related to new pneumonia-improved -In the setting of current scrotal cellulitis with right knee prosthetic infection -Continues currently on vancomycin as well as rifampin as recommended for 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 for total 6 weeks which began on 9/7; being followed by ID Dr. Layne Benton at [facility] for pneumonia for total 7-day course of treatment, completed 7/7 days of therapy -Patient has PICC line -BLOOD CULTURE POSITIVE FOR PSEUDOMONAS  PSEUDOMONAS BACTEREMIA - HE IS ON IV CEFEPIME STARTED 02/08/21 - will call and  discuss with ID physician on 02/09/21  Acute Dyspnea  - D dimer markedly elevated 10/2 - check V/Q scan - CXR with mild pulmonary edema, IV lasix given, recheck in AM - check US doppler of bilateral LEs to rule out DVT   Scrotal cellulitis - Edema markedly improved - Erythema markedly improved  Fever - RESOLVED now  -  blood cultures positive for pseudomonas, chest x ray no acute findings, CBC diff WNL, UA WNL   - continue current antibiotics therapy   Acute hypoxemic respiratory failure secondary to pulmonary edema-resolved -Not currently hypoxemic or tachypneic and edema appears to be near baseline -Started IV Lasix 9/25 due to improvement in blood pressure readings, but no further need for diuretics noted -Continue to monitor carefully in light of creatinine elevation -Repeat 2D echocardiogram on 9/24 with LVEF 65-70%   Splenomegaly and ascites -Appears stable and near baseline -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 and moderate splenomegaly noted on CT scan -Continue to monitor   AKI on CKD stage IV -resumeds home diuretics lasix 20 mg daily -Discontinue further IV fluid and monitor  -Baseline creatinine 1.8-2.5 -Continue to monitor  Mild hyponatremia - from diuresis and volume overload -Currently stable, continue to monitor -Consider further testing as needed   Chronic venous stasis dermatitis -Stable   OSA -Refuses CPAP   Chronic low back pain/multiple falls -Pain medications as needed -Fall precautions   Thrombocytopenia -Likely related to splenomegaly, possibly due to rifampin, following -Hold further Lovenox and maintain on SCDs -Monitor repeat CBC to trend   Morbid obesity -  Lifestyle changes outpatient     DVT prophylaxis:SCDs Code Status: Full Family Communication: Wife on phone 9/25, 9/29 Disposition Plan: awaiting for insurance authorization for SNF placement Status is: Inpatient   Remains inpatient appropriate because:IV  treatments appropriate due to intensity of illness or inability to take PO and Inpatient level of care appropriate due to severity of illness   Dispo: The patient is from: SNF              Anticipated d/c is to: SNF               Patient currently is medically stable to d/c.              Difficult to place patient YES     Consultants:  None   Procedures:  See below  Antimicrobials:  Anti-infectives (From admission, onward)    Start     Dose/Rate Route Frequency Ordered Stop   02/08/21 2000  ceFEPIme (MAXIPIME) 2 g in sodium chloride 0.9 % 100 mL IVPB        2 g 200 mL/hr over 30 Minutes Intravenous Every 12 hours 02/08/21 1302     02/08/21 1200  piperacillin-tazobactam (ZOSYN) IVPB 3.375 g  Status:  Discontinued        3.375 g 12.5 mL/hr over 240 Minutes Intravenous Every 8 hours 02/08/21 0558 02/08/21 1302   02/08/21 0600  piperacillin-tazobactam (ZOSYN) IVPB 3.375 g        3.375 g 100 mL/hr over 30 Minutes Intravenous STAT 02/08/21 0558 02/08/21 0716   02/03/21 2000  DAPTOmycin (CUBICIN) 900 mg in sodium chloride 0.9 % IVPB        900 mg 136 mL/hr over 30 Minutes Intravenous Daily 02/03/21 1516     02/02/21 1410  vancomycin variable dose per unstable renal function (pharmacist dosing)  Status:  Discontinued         Does not apply See admin instructions 02/02/21 1410 02/03/21 1516   02/02/21 1400  rifampin (RIFADIN) capsule 300 mg        300 mg Oral 3 times daily 02/02/21 1106     02/02/21 1330  amoxicillin-clavulanate (AUGMENTIN) 875-125 MG per tablet 1 tablet        1 tablet Oral Every 12 hours 02/02/21 1238 02/06/21 2026   02/02/21 1000  rifampin (RIFADIN) capsule 450 mg  Status:  Discontinued        450 mg Oral Every 12 hours 02/02/21 0701 02/02/21 1106   02/02/21 0830  vancomycin (VANCOREADY) IVPB 2000 mg/400 mL        2,000 mg 200 mL/hr over 120 Minutes Intravenous  Once 02/02/21 0803 02/02/21 1547   01/31/21 0330  ceFEPIme (MAXIPIME) 2 g in sodium chloride 0.9 % 100 mL  IVPB  Status:  Discontinued        2 g 200 mL/hr over 30 Minutes Intravenous Every 12 hours 02/04/2021 2214 02/02/21 0701   01/31/21 0030  linezolid (ZYVOX) tablet 600 mg  Status:  Discontinued        600 mg Oral Every 12 hours 01/10/2021 2344 02/02/21 0701   01/21/2021 2330  vancomycin (VANCOREADY) IVPB 1500 mg/300 mL  Status:  Discontinued       See Hyperspace for full Linked Orders Report.   1,500 mg 150 mL/hr over 120 Minutes Intravenous  Once 02/01/2021 2148 01/16/2021 2151   01/29/2021 2230  vancomycin (VANCOCIN) IVPB 1000 mg/200 mL premix  Status:  Discontinued       See Hyperspace for full  Linked Orders Report.   1,000 mg 200 mL/hr over 60 Minutes Intravenous  Once 01/13/2021 2148 02/05/2021 2151   01/26/2021 2145  vancomycin (VANCOCIN) IVPB 1000 mg/200 mL premix  Status:  Discontinued        1,000 mg 200 mL/hr over 60 Minutes Intravenous  Once 01/26/2021 2140 01/18/2021 2145   01/26/2021 2145  ceFEPIme (MAXIPIME) 2 g in sodium chloride 0.9 % 100 mL IVPB  Status:  Discontinued        2 g 200 mL/hr over 30 Minutes Intravenous  Once 01/23/2021 2140 02/04/2021 2212   01/27/2021 1830  aztreonam (AZACTAM) 2 g in sodium chloride 0.9 % 100 mL IVPB        2 g 200 mL/hr over 30 Minutes Intravenous  Once 01/27/2021 1821 01/08/2021 2034      Subjective: Pt reports feeling shortness of breath and had a "bad night" no chest pain and no palpitations.   Objective: Vitals:   02/07/21 2051 02/08/21 0500 02/08/21 0538 02/08/21 1501  BP: 130/66  130/63 (!) 119/50  Pulse: 90  92 91  Resp: '19  19 20  '$ Temp: 98 F (36.7 C)  98.5 F (36.9 C) 99.1 F (37.3 C)  TempSrc:   Oral Oral  SpO2: 93%  94% 94%  Weight:  (!) 186.1 kg    Height:        Intake/Output Summary (Last 24 hours) at 02/08/2021 1618 Last data filed at 02/08/2021 1516 Gross per 24 hour  Intake 1181.63 ml  Output 200 ml  Net 981.63 ml   Filed Weights   02/06/21 0500 02/07/21 0500 02/08/21 0500  Weight: (!) 181.7 kg (!) 183.8 kg (!) 186.1 kg    Examination:  General exam: NAD. Cooperative.  Appears calm and comfortable, morbidly obese Respiratory system: Clear to auscultation. Respiratory effort normal.  Currently on room air. Cardiovascular system: normal S1 & S2 heard.  Gastrointestinal system: Abdomen is obese, normal BS, no HSM, soft GU: edematous scrotum, erythematous skin, no weeping seen.  Central nervous system: Alert and awake Extremities: Chronic venous stasis changes, with scattered erythema. Scarring of right knee. Skin: No significant lesions noted.   Psychiatry: normal affect.   Data Reviewed: I have personally reviewed following labs and imaging studies  CBC: Recent Labs  Lab 02/02/21 0455 02/03/21 0536 02/04/21 0500 02/06/21 1227 02/07/21 0515  WBC 7.5 5.6 7.3 9.3 10.0  NEUTROABS  --   --   --  7.9* 8.0*  HGB 8.4* 8.7* 9.2* 10.3* 8.3*  HCT 27.4* 27.6* 30.3* 33.5* 27.1*  MCV 92.6 94.2 91.8 92.0 93.4  PLT 82* 71* 87* 110* 73*   Basic Metabolic Panel: Recent Labs  Lab 02/02/21 0455 02/03/21 0536 02/04/21 0500 02/07/21 0515 02/08/21 0421  NA 133* 134* 134* 134* 133*  K 3.5 3.5 3.5 3.8 3.4*  CL 102 102 103 103 101  CO2 '25 25 23 25 25  '$ GLUCOSE 118* 111* 130* 108* 142*  BUN 31* 32* 31* 30* 32*  CREATININE 3.08* 3.06* 2.71* 2.57* 2.77*  CALCIUM 7.4* 7.4* 7.7* 7.5* 7.6*  MG 1.9 2.1 2.1  --   --    GFR: Estimated Creatinine Clearance: 42.9 mL/min (A) (by C-G formula based on SCr of 2.77 mg/dL (H)). Liver Function Tests: Recent Labs  Lab 02/07/21 0515 02/08/21 0421  AST 22 25  ALT 17 18  ALKPHOS 99 147*  BILITOT 1.5* 1.3*  PROT 6.3* 7.1  ALBUMIN 1.7* 2.0*   No results for input(s): LIPASE, AMYLASE in the  last 168 hours. No results for input(s): AMMONIA in the last 168 hours. Coagulation Profile: No results for input(s): INR, PROTIME in the last 168 hours.  Cardiac Enzymes: Recent Labs  Lab 02/03/21 0536  CKTOTAL 32*   BNP (last 3 results) No results for input(s): PROBNP in  the last 8760 hours. HbA1C: No results for input(s): HGBA1C in the last 72 hours. CBG: Recent Labs  Lab 02/08/21 0834  GLUCAP 129*    Lipid Profile: No results for input(s): CHOL, HDL, LDLCALC, TRIG, CHOLHDL, LDLDIRECT in the last 72 hours. Thyroid Function Tests: No results for input(s): TSH, T4TOTAL, FREET4, T3FREE, THYROIDAB in the last 72 hours. Anemia Panel: No results for input(s): VITAMINB12, FOLATE, FERRITIN, TIBC, IRON, RETICCTPCT in the last 72 hours. Sepsis Labs: Recent Labs  Lab 02/06/21 1756 02/07/21 0515 02/08/21 0421  PROCALCITON 5.28 7.39 6.62    Recent Results (from the past 240 hour(s))  Culture, blood (single) w Reflex to ID Panel     Status: None   Collection Time: 02/03/2021  5:15 PM   Specimen: BLOOD LEFT HAND  Result Value Ref Range Status   Specimen Description BLOOD LEFT HAND  Final   Special Requests   Final    BOTTLES DRAWN AEROBIC AND ANAEROBIC Blood Culture results may not be optimal due to an excessive volume of blood received in culture bottles   Culture   Final    NO GROWTH 5 DAYS Performed at [facility], [address], [place], [place] [address]    Report Status 02/04/2021 FINAL  Final  Urine Culture     Status: None   Collection Time: 01/19/2021  5:40 PM   Specimen: In/Out Cath Urine  Result Value Ref Range Status   Specimen Description   Final    IN/OUT CATH URINE Performed at [facility], [address] [place], [place] [address]    Special Requests   Final    NONE Performed at [facility], [address] [place], [place] [address]    Culture   Final    NO GROWTH Performed at [facility], [place] [address] [place],  [address]    Report Status 02/01/2021 FINAL  Final  Resp Panel by RT-PCR (Flu A&B, Covid) Nasopharyngeal Swab     Status: None   Collection Time: 01/28/2021  5:45 PM   Specimen: Nasopharyngeal Swab; Nasopharyngeal(NP) swabs in vial transport medium  Result Value Ref Range Status   SARS Coronavirus 2  by RT PCR NEGATIVE NEGATIVE Final    Comment: (NOTE) SARS-CoV-2 target nucleic acids are NOT DETECTED.  The SARS-CoV-2 RNA is generally detectable in upper respiratory specimens during the acute phase of infection. The lowest concentration of SARS-CoV-2 viral copies this assay can detect is 138 copies/mL. A negative result does not preclude SARS-Cov-2 infection and should not be used as the sole basis for treatment or other patient management decisions. A negative result may occur with  improper specimen collection/handling, submission of specimen other than nasopharyngeal swab, presence of viral mutation(s) within the areas targeted by this assay, and inadequate number of viral copies(<138 copies/mL). A negative result must be combined with clinical observations, patient history, and epidemiological information. The expected result is Negative.  Fact Sheet for Patients:  EntrepreneurPulse.com.au  Fact Sheet for Healthcare Providers:  IncredibleEmployment.be  This test is no t yet approved or cleared by the [place] FDA and  has been authorized for detection and/or diagnosis of SARS-CoV-2 by FDA under an Emergency Use Authorization (EUA).  This EUA will remain  in effect (meaning this test can be used) for the duration of the COVID-19 declaration under Section 564(b)(1) of the Act, 21 U.S.C.section 360bbb-3(b)(1), unless the authorization is terminated  or revoked sooner.       Influenza A by PCR NEGATIVE NEGATIVE Final   Influenza B by PCR NEGATIVE NEGATIVE Final    Comment: (NOTE) The Xpert Xpress SARS-CoV-2/FLU/RSV plus assay is intended as an aid in the diagnosis of influenza from Nasopharyngeal swab specimens and should not be used as a sole basis for treatment. Nasal washings and aspirates are unacceptable for Xpert Xpress SARS-CoV-2/FLU/RSV testing.  Fact Sheet for Patients: EntrepreneurPulse.com.au  Fact  Sheet for Healthcare Providers: IncredibleEmployment.be  This test is not yet approved or cleared by the [place] FDA and has been authorized for detection and/or diagnosis of SARS-CoV-2 by FDA under an Emergency Use Authorization (EUA). This EUA will remain in effect (meaning this test can be used) for the duration of the COVID-19 declaration under Section 564(b)(1) of the Act, 21 U.S.C. section 360bbb-3(b)(1), unless the authorization is terminated or revoked.  Performed at [facility], [address], [place], [place] [address]   MRSA Next Gen by PCR, Nasal     Status: None   Collection Time: 01/15/2021 11:44 PM   Specimen: Nasal Mucosa; Nasal Swab  Result Value Ref Range Status   MRSA by PCR Next Gen NOT DETECTED NOT DETECTED Final    Comment: (NOTE) The GeneXpert MRSA Assay (FDA approved for NASAL specimens only), is one component of a comprehensive MRSA colonization surveillance program. It is not intended to diagnose MRSA infection nor to guide or monitor treatment for MRSA infections. Test performance is not FDA approved in patients less than 68 years old. Performed at [facility], [address], [place], [place] [address]   Culture, blood (routine x 2)     Status: None (Preliminary result)   Collection Time: 02/06/21 12:27 PM   Specimen: Right Antecubital; Blood  Result Value Ref Range Status   Specimen Description   Final    RIGHT ANTECUBITAL BOTTLES DRAWN AEROBIC AND ANAEROBIC Performed at [facility], [address] [place], [place] [address]    Special Requests   Final    Blood Culture adequate volume Performed at [facility], [address] [place], [place] [address]    Culture  Setup Time   Final    AEROBIC BOTTLE GRAM NEGATIVE RODS CRITICAL RESULT CALLED TO, READ BACK BY AND VERIFIED WITH: BROWN,S 2329 02/07/2021 [place],R CRITICAL RESULT CALLED TO, READ BACK BY AND VERIFIED WITH: S BROWN,RN'@0448'$  02/08/21 [place] Performed at [facility], [address] [place], [place] [address]    Culture GRAM NEGATIVE RODS  Final   Report Status PENDING  Incomplete  Culture, blood (routine x 2)     Status: None (Preliminary result)   Collection Time: 02/06/21 12:27 PM   Specimen: BLOOD RIGHT HAND  Result Value Ref Range Status   Specimen Description BLOOD RIGHT HAND BOTTLES DRAWN AEROBIC ONLY  Final   Special Requests Blood Culture adequate volume  Final   Culture   Final    NO GROWTH 2 DAYS Performed at [facility], [address], [place],  [address]    Report Status PENDING  Incomplete  Blood Culture ID Panel (Reflexed)     Status: Abnormal   Collection Time: 02/06/21 12:27 PM  Result Value Ref Range Status   Enterococcus faecalis NOT DETECTED NOT DETECTED Final  Enterococcus Faecium NOT DETECTED NOT DETECTED Final   Listeria monocytogenes NOT DETECTED NOT DETECTED Final   Staphylococcus species NOT DETECTED NOT DETECTED Final   Staphylococcus aureus (BCID) NOT DETECTED NOT DETECTED Final   Staphylococcus epidermidis NOT DETECTED NOT DETECTED Final   Staphylococcus lugdunensis NOT DETECTED NOT DETECTED Final   Streptococcus species NOT DETECTED NOT DETECTED Final   Streptococcus agalactiae NOT DETECTED NOT DETECTED Final   Streptococcus pneumoniae NOT DETECTED NOT DETECTED Final   Streptococcus pyogenes NOT DETECTED NOT DETECTED Final   A.calcoaceticus-baumannii NOT DETECTED NOT DETECTED Final   Bacteroides fragilis NOT DETECTED NOT DETECTED Final   Enterobacterales NOT DETECTED NOT DETECTED Final   Enterobacter cloacae complex NOT DETECTED NOT DETECTED Final   Escherichia coli NOT DETECTED NOT DETECTED Final   Klebsiella aerogenes NOT DETECTED NOT DETECTED Final   Klebsiella oxytoca NOT DETECTED NOT DETECTED Final   Klebsiella pneumoniae NOT DETECTED NOT DETECTED Final   Proteus species NOT DETECTED NOT DETECTED Final   Salmonella species NOT DETECTED NOT DETECTED Final   Serratia marcescens NOT DETECTED  NOT DETECTED Final   Haemophilus influenzae NOT DETECTED NOT DETECTED Final   Neisseria meningitidis NOT DETECTED NOT DETECTED Final   Pseudomonas aeruginosa DETECTED (A) NOT DETECTED Final    Comment: CRITICAL RESULT CALLED TO, READ BACK BY AND VERIFIED WITH: S BROWN,RN'@0449'$  02/08/21 [place]    Stenotrophomonas maltophilia NOT DETECTED NOT DETECTED Final   Candida albicans NOT DETECTED NOT DETECTED Final   Candida auris NOT DETECTED NOT DETECTED Final   Candida glabrata NOT DETECTED NOT DETECTED Final   Candida krusei NOT DETECTED NOT DETECTED Final   Candida parapsilosis NOT DETECTED NOT DETECTED Final   Candida tropicalis NOT DETECTED NOT DETECTED Final   Cryptococcus neoformans/gattii NOT DETECTED NOT DETECTED Final   CTX-M ESBL NOT DETECTED NOT DETECTED Final   Carbapenem resistance IMP NOT DETECTED NOT DETECTED Final   Carbapenem resistance KPC NOT DETECTED NOT DETECTED Final   Carbapenem resistance NDM NOT DETECTED NOT DETECTED Final   Carbapenem resistance VIM NOT DETECTED NOT DETECTED Final    Comment: Performed at [facility], [address] [place], [place] [address]   Radiology Studies: NM Pulmonary Perfusion  Result Date: 02/08/2021 CLINICAL DATA:  Suspected pulmonary embolus. Low-intermediate probability. EXAM: NUCLEAR MEDICINE PERFUSION LUNG SCAN TECHNIQUE: Perfusion images were obtained in multiple projections after intravenous injection of radiopharmaceutical. Ventilation scans intentionally deferred if perfusion scan and chest x-ray adequate for interpretation during COVID 19 epidemic. RADIOPHARMACEUTICALS:  4.3 mCi Tc-51mMAA IV COMPARISON:  Chest x-ray on 02/08/2021 FINDINGS: There is normal perfusion. No pleural based wedge-shaped defects to indicate presence of acute pulmonary embolus. IMPRESSION: Normal perfusion. Electronically Signed   By: ENolon NationsM.D.   On: 02/08/2021 15:16   DG CHEST PORT 1 VIEW  Result Date: 02/08/2021 CLINICAL DATA:  Patient  admitted for cellulitis to chest wall. EXAM: PORTABLE CHEST 1 VIEW COMPARISON:  February 06, 2021 FINDINGS: Stable cardiomegaly. The hila and mediastinum are unchanged. No pneumothorax. No nodules or masses. Mild interstitial prominence. IMPRESSION: Mild interstitial prominence suggests pulmonary venous congestion/mild pulmonary edema. No other abnormalities or changes. Stable left PICC line. Electronically Signed   By: DDorise BullionIII M.D.   On: 02/08/2021 10:52    Scheduled Meds:  acetaminophen  650 mg Oral Q6H   Or   acetaminophen  650 mg Rectal Q6H   Chlorhexidine Gluconate Cloth  6 each Topical Daily   finasteride  5 mg Oral Daily  fluticasone  1 spray Each Nare Daily   folic acid  1 mg Oral Daily   furosemide  40 mg Oral Daily   gabapentin  100 mg Oral BID   nystatin   Topical BID   rifampin  300 mg Oral TID   saccharomyces boulardii  250 mg Oral BID   sodium chloride flush  3 mL Intravenous Q8H   tamsulosin  0.4 mg Oral QHS   topiramate  100 mg Oral Daily    LOS: 9 days   Time spent: 35 minutes Tarnisha Kachmar Wynetta Emery, MD [facility]  If 7PM-7AM, please contact night-coverage [email or web address] 02/08/2021, 4:18 PM

## 2021-02-08 NOTE — Progress Notes (Signed)
Date and time results received: 02/08/21 5:33 AM  (use smartphrase ".now" to insert current time)  Test: Blood cultures Critical Value: positive for pseudomonas aeruginosa  Name of Provider Notified: Dr. Carlynn Purl  Orders Received? Or Actions Taken?:  awaiting orders

## 2021-02-09 ENCOUNTER — Inpatient Hospital Stay (HOSPITAL_COMMUNITY): Payer: Medicare HMO

## 2021-02-09 DIAGNOSIS — R0600 Dyspnea, unspecified: Secondary | ICD-10-CM

## 2021-02-09 DIAGNOSIS — R509 Fever, unspecified: Secondary | ICD-10-CM | POA: Diagnosis not present

## 2021-02-09 DIAGNOSIS — J9601 Acute respiratory failure with hypoxia: Secondary | ICD-10-CM | POA: Diagnosis not present

## 2021-02-09 DIAGNOSIS — L03313 Cellulitis of chest wall: Secondary | ICD-10-CM | POA: Diagnosis not present

## 2021-02-09 LAB — CBC WITH DIFFERENTIAL/PLATELET
Abs Immature Granulocytes: 0.1 10*3/uL — ABNORMAL HIGH (ref 0.00–0.07)
Basophils Absolute: 0 10*3/uL (ref 0.0–0.1)
Basophils Relative: 1 %
Eosinophils Absolute: 0.2 10*3/uL (ref 0.0–0.5)
Eosinophils Relative: 2 %
HCT: 26.8 % — ABNORMAL LOW (ref 39.0–52.0)
Hemoglobin: 8.2 g/dL — ABNORMAL LOW (ref 13.0–17.0)
Immature Granulocytes: 1 %
Lymphocytes Relative: 11 %
Lymphs Abs: 0.9 10*3/uL (ref 0.7–4.0)
MCH: 28.7 pg (ref 26.0–34.0)
MCHC: 30.6 g/dL (ref 30.0–36.0)
MCV: 93.7 fL (ref 80.0–100.0)
Monocytes Absolute: 1.6 10*3/uL — ABNORMAL HIGH (ref 0.1–1.0)
Monocytes Relative: 19 %
Neutro Abs: 5.6 10*3/uL (ref 1.7–7.7)
Neutrophils Relative %: 66 %
Platelets: 78 10*3/uL — ABNORMAL LOW (ref 150–400)
RBC: 2.86 MIL/uL — ABNORMAL LOW (ref 4.22–5.81)
RDW: 17 % — ABNORMAL HIGH (ref 11.5–15.5)
WBC: 8.4 10*3/uL (ref 4.0–10.5)
nRBC: 0 % (ref 0.0–0.2)

## 2021-02-09 LAB — BASIC METABOLIC PANEL
Anion gap: 5 (ref 5–15)
BUN: 35 mg/dL — ABNORMAL HIGH (ref 8–23)
CO2: 25 mmol/L (ref 22–32)
Calcium: 7.8 mg/dL — ABNORMAL LOW (ref 8.9–10.3)
Chloride: 103 mmol/L (ref 98–111)
Creatinine, Ser: 2.79 mg/dL — ABNORMAL HIGH (ref 0.61–1.24)
GFR, Estimated: 25 mL/min — ABNORMAL LOW (ref >60)
Glucose, Bld: 124 mg/dL — ABNORMAL HIGH (ref 70–99)
Potassium: 3.9 mmol/L (ref 3.5–5.1)
Sodium: 133 mmol/L — ABNORMAL LOW (ref 135–145)

## 2021-02-09 LAB — MAGNESIUM: Magnesium: 1.8 mg/dL (ref 1.7–2.4)

## 2021-02-09 NOTE — Progress Notes (Signed)
Per MD Wynetta Emery- PICC to be removed due to considering picc as source for bacteremia. Waiting on confirmation from [facility] regarding what type of PICC patient has. (Tunneling vs non tunneling).

## 2021-02-09 NOTE — TOC Progression Note (Signed)
Transition of Care [facility]) - Progression Note    Patient Details  Name: ESHAWN OHAGAN MRN: [id number] Date of Birth: 12-04-1955  Transition of Care [facility] CM/SW Contact  Salome Arnt, [place] Phone Number: 02/09/2021, 2:44 PM  Clinical Narrative:  [facility] Humbles still pending. Pt will d/c to [place] when [place] received. TOC will continue to follow.      Expected Discharge Plan: Skilled Nursing Facility Barriers to Discharge: Ship broker  Expected Discharge Plan and Services Expected Discharge Plan: [place] In-house Referral: Clinical Social Work   Post Acute Care Choice: [place] Living arrangements for the past 2 months: [place]                                       Social Determinants of Health (SDOH) Interventions    Readmission Risk Interventions No flowsheet data found.

## 2021-02-09 NOTE — Progress Notes (Signed)
Physical Therapy Treatment Patient Details Name: Eddie Fletcher MRN: [id number] DOB: 18-May-1955 Today's Date: 02/09/2021   History of Present Illness [name] is a 65 y.o. male with medical history significant for hypertension, chronic back pain, multiple falls, sleep apnea, morbid obesity, BPH who presents to the emergency department from [facility] via EMS due to 1 day onset of fever and was also noted to be somewhat more somnolent.  He is being actively treated with IV vancomycin for cellulitis of the scrotum and legs and the concern was his febrile episode as well as some hypoxemia.    PT Comments    Progressing with POC details and demonstrating increased exercise tolerance on this date.  Pt is resistant to OOB maneuvers at this time as he is anticipating PICC team to come and remove his line.  Pt educated that OOB mobility would not interfere with this procedure, but again requests to remain in bed in preparation.  Able to mobilize to sitting EOB with mod A. Continued sessions indicated to progress strength and activity tolerance to improve mobilization to chair.    Recommendations for follow up therapy are one component of a multi-disciplinary discharge planning process, led by the attending physician.  Recommendations may be updated based on patient status, additional functional criteria and insurance authorization.  Follow Up Recommendations  SNF     Equipment Recommendations  None recommended by PT    Recommendations for Other Services       Precautions / Restrictions       Mobility  Bed Mobility Overal bed mobility: Needs Assistance Bed Mobility: Supine to Sit;Sit to Supine     Supine to sit: Mod assist;HOB elevated Sit to supine: Mod assist   General bed mobility comments: had difficulty sitting up at bedside requiring repeated attempts and use of bed rail with [facility] rasied    Transfers                    Ambulation/Gait                  Stairs             Wheelchair Mobility    Modified Rankin (Stroke Patients Only)       Balance                                            Cognition                                              Exercises General Exercises - Lower Extremity Ankle Circles/Pumps: Strengthening;Both;Other (comment) (3x20 reps) Quad Sets: Strengthening;Both;Other (comment) (3x20 reps) Gluteal Sets: Strengthening;Both;Other (comment) (3x20 reps) Heel Slides: AROM;Both;Other (comment) (3x20 reps)    General Comments        Pertinent Vitals/Pain      Home Living                      Prior Function            PT Goals (current goals can now be found in the care plan section) Acute Rehab PT Goals Patient Stated Goal: return home after rehab Potential to Achieve Goals: Good Progress towards PT goals: Progressing toward goals  Frequency    Min 3X/week      PT Plan Current plan remains appropriate    Co-evaluation              AM-PAC PT "6 Clicks" Mobility   Outcome Measure                   End of Session   Activity Tolerance: Patient tolerated treatment well;Patient limited by fatigue Patient left: in bed;with call bell/phone within reach;with bed alarm set Nurse Communication: Mobility status PT Visit Diagnosis: Difficulty in walking, not elsewhere classified (R26.2);Muscle weakness (generalized) (M62.81);Unsteadiness on feet (R26.81)     Time: 1530-1600 PT Time Calculation (min) (ACUTE ONLY): 30 min  Charges:  $Therapeutic Exercise: 8-22 mins $Therapeutic Activity: 8-22 mins                     4:17 PM, 02/09/21 M. Sherlyn Lees, PT, DPT Physical Therapist- [place] Office Number: [phone]

## 2021-02-09 NOTE — Care Management Important Message (Signed)
Important Message  Patient Details  Name: Eddie Fletcher MRN: [id number] Date of Birth: 02-09-1956   Medicare Important Message Given:  Yes     Tommy Medal 02/09/2021, 11:56 AM

## 2021-02-09 NOTE — Progress Notes (Addendum)
PROGRESS NOTE    Eddie Fletcher  [id number] DOB: [birth year] DOA: 01/24/2021 PCP: Curly Rim, MD    Brief Narrative: Eddie Fletcher is a 65 year old male who initially presented 01/27/2021 with fever, somnolence, severe cellulitis, chf exacerbation. 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 After possibly finishing treatment for staph epidermitis infection of the right knee replacement, the patient also has extensive cellulitis, which was treated, and then he became febrile 10/1-2, with a newly discovered pseudomonas bacteremia, which has been treated with cefepime. Infectious disease was consulted 10/3, and it was suggested that the patient's picc line may be a source of the pseudomonal bacteremia. Recommendations were made to removed the picc, treat the bacteremia, and repeat blood cultures.  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 and moderate splenomegaly noted on CT scan -Continue to monitor    Chronic venous stasis dermatitis of both lower extremities -stable    AKI on CKD stage IV -resumed home lasix -Discontinue further IV fluids -Baseline creatinine 1.8-2.5 -Continue to monitor    Acute febrile illness-resolved febrile -pseudomonal bacteremia -treating with cefepime    Cellulitis of scrotum-improved       Cellulitis of chest wall-resolved     DVT prophylaxis: lovenox Code Status: full Family Communication: Disposition Plan: after resolution of pseusomonas bacteremia, resume SNF placement plans Upcoming appointments 03/02/2021 Office Visit Infectious Diseases Minerva Fester, MD   [address], [place] [address]   [phone] (Work)   [phone] (Fax)   Consultants:  Infectious Disease   Procedures: Echocardiogram US venous IMG lower leg bilateral   Antimicrobials: Augmentin 926-9/30 Linezolid 09/24-26 Rifampin9/26- Vancomycin Azetronam 9/23 Cefepime 10/2- Daptomycin Zosyn   Subjective: Patient stated she feels improved, reduced swelling, improved breathing,   Objective: Vitals:   02/08/21 1501 02/08/21 1933 02/09/21 0435 02/09/21 0451  BP: (!) 119/50 (!) 122/57 (!) 123/57   Pulse: 91 83 85   Resp: '20 19 20   '$ Temp: 99.1 F (37.3 C) 98.2 F (36.8 C) 98.1 F (36.7 C)   TempSrc: Oral Oral Oral   SpO2: 94% 96% 94%   Weight:    (!) 186.4 kg  Height:        Intake/Output Summary (Last 24 hours) at 02/09/2021 1258 Last data filed at 02/09/2021 0900 Gross per 24 hour  Intake 1221.63 ml  Output 1150 ml  Net 71.63 ml   Filed Weights   02/07/21 0500 02/08/21 0500 02/09/21 0451  Weight: (!) 183.8 kg (!) 186.1 kg (!) 186.4 kg    Examination:  General exam: Appears calm and comfortable, obese male in no obvious distress  Respiratory system: Clear to auscultation. Respiratory effort normal.  Cardiovascular system: S1 & S2 heard, RRR. No JVD, murmurs, rubs, gallops or clicks. No pedal edema.  Gastrointestinal system: Abdomen is nondistended, soft and nontender. No organomegaly or masses felt. Normal bowel  sounds heard.  Central nervous system: Alert and oriented. No focal neurological deficits.  Extremities: patient moves extremities appropriately  Skin: legs have appearance of resolving cellulitis and  chronic venous insufficiency  Psychiatry: Judgement and insight appear normal. Mood & affect appropriate.     Data Reviewed: I have personally reviewed following labs and imaging studies  CBC: Recent Labs  Lab 02/03/21 0536 02/04/21 0500 02/06/21 1227 02/07/21 0515 02/09/21 0512  WBC 5.6 7.3 9.3 10.0 8.4  NEUTROABS  --   --  7.9* 8.0* 5.6  HGB 8.7* 9.2* 10.3* 8.3* 8.2*  HCT 27.6* 30.3* 33.5* 27.1* 26.8*  MCV 94.2 91.8 92.0 93.4 93.7  PLT 71* 87* 110* 73* 78*   Basic Metabolic Panel: Recent Labs  Lab 02/03/21 0536 02/04/21 0500 02/07/21 0515 02/08/21 0421 02/09/21 0512  NA 134* 134* 134* 133* 133*  K 3.5 3.5 3.8 3.4* 3.9  CL 102 103 103 101 103  CO2 '25 23 25 25 25  '$ GLUCOSE 111* 130* 108* 142* 124*  BUN 32* 31* 30* 32* 35*  CREATININE 3.06* 2.71* 2.57* 2.77* 2.79*  CALCIUM 7.4* 7.7* 7.5* 7.6* 7.8*  MG 2.1 2.1  --   --  1.8   GFR: Estimated Creatinine Clearance: 42.7 mL/min (A) (by C-G formula based on SCr of 2.79 mg/dL (H)). Liver Function Tests: Recent Labs  Lab 02/07/21 0515 02/08/21 0421  AST 22 25  ALT 17 18  ALKPHOS 99 147*  BILITOT 1.5* 1.3*  PROT 6.3* 7.1  ALBUMIN 1.7* 2.0*   No results for input(s): LIPASE, AMYLASE in the last 168 hours. No results for input(s): AMMONIA in the last 168 hours. Coagulation Profile: No results for input(s): INR, PROTIME in the last 168 hours. Cardiac Enzymes: Recent Labs  Lab 02/03/21 0536  CKTOTAL 32*   BNP (last 3 results) No results for input(s): PROBNP in the last 8760 hours. HbA1C: No results for input(s): HGBA1C in the last 72 hours. CBG: Recent Labs  Lab 02/08/21 0834  GLUCAP 129*   Lipid Profile: No results for input(s): CHOL, HDL, LDLCALC, TRIG, CHOLHDL, LDLDIRECT in the last 72 hours. Thyroid Function Tests: No results for input(s): TSH, T4TOTAL, FREET4, T3FREE, THYROIDAB in the last 72 hours. Anemia Panel: No results for input(s): VITAMINB12, FOLATE, FERRITIN, TIBC, IRON, RETICCTPCT in the  last 72 hours. Urine analysis:    Component Value Date/Time   COLORURINE YELLOW 02/06/2021 1506   APPEARANCEUR CLEAR 02/06/2021 1506   LABSPEC 1.010 02/06/2021 1506   PHURINE 5.0 02/06/2021 1506   GLUCOSEU NEGATIVE 02/06/2021 1506   HGBUR SMALL (A) 02/06/2021 1506   BILIRUBINUR NEGATIVE 02/06/2021 1506   KETONESUR NEGATIVE 02/06/2021 1506   PROTEINUR 30 (A) 02/06/2021 1506   UROBILINOGEN 0.2 01/08/2015 2044   NITRITE NEGATIVE 02/06/2021 1506   LEUKOCYTESUR NEGATIVE 02/06/2021 1506   Sepsis Labs: '@LABRCNTIP'$ (procalcitonin:4,lacticidven:4)  ) Recent Results (from the past 240 hour(s))  Culture, blood (single) w Reflex to ID Panel     Status: None   Collection Time: 02/05/2021  5:15 PM   Specimen: BLOOD LEFT HAND  Result Value Ref Range Status   Specimen Description BLOOD LEFT HAND  Final   Special Requests   Final    BOTTLES DRAWN AEROBIC AND ANAEROBIC Blood Culture results may not be optimal due to an excessive volume of blood received in culture bottles   Culture   Final    NO GROWTH 5 DAYS Performed at [facility], [address] [place], [place] [address]    Report Status  02/04/2021 FINAL  Final  Urine Culture     Status: None   Collection Time: 01/08/2021  5:40 PM   Specimen: In/Out Cath Urine  Result Value Ref Range Status   Specimen Description   Final    IN/OUT CATH URINE Performed at [facility], [address] [place], [place] [address]    Special Requests   Final    NONE Performed at [facility], [address], [place], [place] [address]    Culture   Final    NO GROWTH Performed at [facility], [place] [address], [place], [place] [address]    Report Status 02/01/2021 FINAL  Final  Resp Panel by RT-PCR (Flu A&B, Covid) Nasopharyngeal Swab     Status: None   Collection Time: 02/06/2021  5:45 PM   Specimen: Nasopharyngeal Swab; Nasopharyngeal(NP) swabs in vial transport medium  Result Value Ref Range Status   SARS Coronavirus 2 by RT PCR NEGATIVE NEGATIVE  Final    Comment: (NOTE) SARS-CoV-2 target nucleic acids are NOT DETECTED.  The SARS-CoV-2 RNA is generally detectable in upper respiratory specimens during the acute phase of infection. The lowest concentration of SARS-CoV-2 viral copies this assay can detect is 138 copies/mL. A negative result does not preclude SARS-Cov-2 infection and should not be used as the sole basis for treatment or other patient management decisions. A negative result may occur with  improper specimen collection/handling, submission of specimen other than nasopharyngeal swab, presence of viral mutation(s) within the areas targeted by this assay, and inadequate number of viral copies(<138 copies/mL). A negative result must be combined with clinical observations, patient history, and epidemiological information. The expected result is Negative.  Fact Sheet for Patients:  EntrepreneurPulse.com.au  Fact Sheet for Healthcare Providers:  IncredibleEmployment.be  This test is no t yet approved or cleared by the [place] FDA and  has been authorized for detection and/or diagnosis of SARS-CoV-2 by FDA under an Emergency Use Authorization (EUA). This EUA will remain  in effect (meaning this test can be used) for the duration of the COVID-19 declaration under Section 564(b)(1) of the Act, 21 U.S.C.section 360bbb-3(b)(1), unless the authorization is terminated  or revoked sooner.       Influenza A by PCR NEGATIVE NEGATIVE Final   Influenza B by PCR NEGATIVE NEGATIVE Final    Comment: (NOTE) The Xpert Xpress SARS-CoV-2/FLU/RSV plus assay is intended as an aid in the diagnosis of influenza from Nasopharyngeal swab specimens and should not be used as a sole basis for treatment. Nasal washings and aspirates are unacceptable for Xpert Xpress SARS-CoV-2/FLU/RSV testing.  Fact Sheet for Patients: EntrepreneurPulse.com.au  Fact Sheet for Healthcare  Providers: IncredibleEmployment.be  This test is not yet approved or cleared by the [place] FDA and has been authorized for detection and/or diagnosis of SARS-CoV-2 by FDA under an Emergency Use Authorization (EUA). This EUA will remain in effect (meaning this test can be used) for the duration of the COVID-19 declaration under Section 564(b)(1) of the Act, 21 U.S.C. section 360bbb-3(b)(1), unless the authorization is terminated or revoked.  Performed at [facility], [address] [place], [place] [address]   MRSA Next Gen by PCR, Nasal     Status: None   Collection Time: 01/29/2021 11:44 PM   Specimen: Nasal Mucosa; Nasal Swab  Result Value Ref Range Status   MRSA by PCR Next Gen NOT DETECTED NOT DETECTED Final    Comment: (NOTE) The GeneXpert MRSA Assay (FDA approved for NASAL specimens only), is  one component of a comprehensive MRSA colonization surveillance program. It is not intended to diagnose MRSA infection nor to guide or monitor treatment for MRSA infections. Test performance is not FDA approved in patients less than 16 years old. Performed at [facility], [address] [place], [place] [address]   Culture, blood (routine x 2)     Status: Abnormal (Preliminary result)   Collection Time: 02/06/21 12:27 PM   Specimen: Right Antecubital; Blood  Result Value Ref Range Status   Specimen Description   Final    RIGHT ANTECUBITAL BOTTLES DRAWN AEROBIC AND ANAEROBIC Performed at [facility], [address] [place], [place] [address]    Special Requests   Final    Blood Culture adequate volume Performed at [place] [place], [place] [address]    Culture  Setup Time   Final    AEROBIC BOTTLE GRAM NEGATIVE RODS CRITICAL RESULT CALLED TO, READ BACK BY AND VERIFIED WITH: BROWN,S 2329 02/07/2021 [place],R CRITICAL RESULT CALLED TO, READ BACK BY AND VERIFIED WITH: S BROWN,RN'@0448'$  02/08/21 [place]    Culture (A)  Final    PSEUDOMONAS  AERUGINOSA SUSCEPTIBILITIES TO FOLLOW Performed at [facility], [place] [address], [place], [place] [address]    Report Status PENDING  Incomplete  Culture, blood (routine x 2)     Status: None (Preliminary result)   Collection Time: 02/06/21 12:27 PM   Specimen: BLOOD RIGHT HAND  Result Value Ref Range Status   Specimen Description BLOOD RIGHT HAND BOTTLES DRAWN AEROBIC ONLY  Final   Special Requests Blood Culture adequate volume  Final   Culture   Final    NO GROWTH 3 DAYS Performed at [facility], [address], [place], [place] [address]    Report Status PENDING  Incomplete  Blood Culture ID Panel (Reflexed)     Status: Abnormal   Collection Time: 02/06/21 12:27 PM  Result Value Ref Range Status   Enterococcus faecalis NOT DETECTED NOT DETECTED Final   Enterococcus Faecium NOT DETECTED NOT DETECTED Final   Listeria monocytogenes NOT DETECTED NOT DETECTED Final   Staphylococcus species NOT DETECTED NOT DETECTED Final   Staphylococcus aureus (BCID) NOT DETECTED NOT DETECTED Final   Staphylococcus epidermidis NOT DETECTED NOT DETECTED Final   Staphylococcus lugdunensis NOT DETECTED NOT DETECTED Final   Streptococcus species NOT DETECTED NOT DETECTED Final   Streptococcus agalactiae NOT DETECTED NOT DETECTED Final   Streptococcus pneumoniae NOT DETECTED NOT DETECTED Final   Streptococcus pyogenes NOT DETECTED NOT DETECTED Final   A.calcoaceticus-baumannii NOT DETECTED NOT DETECTED Final   Bacteroides fragilis NOT DETECTED NOT DETECTED Final   Enterobacterales NOT DETECTED NOT DETECTED Final   Enterobacter cloacae complex NOT DETECTED NOT DETECTED Final   Escherichia coli NOT DETECTED NOT DETECTED Final   Klebsiella aerogenes NOT DETECTED NOT DETECTED Final   Klebsiella oxytoca NOT DETECTED NOT DETECTED Final   Klebsiella pneumoniae NOT DETECTED NOT DETECTED Final   Proteus species NOT DETECTED NOT DETECTED Final   Salmonella species NOT DETECTED NOT DETECTED Final    Serratia marcescens NOT DETECTED NOT DETECTED Final   Haemophilus influenzae NOT DETECTED NOT DETECTED Final   Neisseria meningitidis NOT DETECTED NOT DETECTED Final   Pseudomonas aeruginosa DETECTED (A) NOT DETECTED Final    Comment: CRITICAL RESULT CALLED TO, READ BACK BY AND VERIFIED WITH: S BROWN,RN'@0449'$  02/08/21 [place]    Stenotrophomonas maltophilia NOT DETECTED NOT DETECTED Final   Candida albicans NOT DETECTED NOT DETECTED Final   Candida  auris NOT DETECTED NOT DETECTED Final   Candida glabrata NOT DETECTED NOT DETECTED Final   Candida krusei NOT DETECTED NOT DETECTED Final   Candida parapsilosis NOT DETECTED NOT DETECTED Final   Candida tropicalis NOT DETECTED NOT DETECTED Final   Cryptococcus neoformans/gattii NOT DETECTED NOT DETECTED Final   CTX-M ESBL NOT DETECTED NOT DETECTED Final   Carbapenem resistance IMP NOT DETECTED NOT DETECTED Final   Carbapenem resistance KPC NOT DETECTED NOT DETECTED Final   Carbapenem resistance NDM NOT DETECTED NOT DETECTED Final   Carbapenem resistance VIM NOT DETECTED NOT DETECTED Final    Comment: Performed at [facility], [place] [address], [place], [place] [address]         Radiology Studies: NM Pulmonary Perfusion  Result Date: 02/08/2021 CLINICAL DATA:  Suspected pulmonary embolus. Low-intermediate probability. EXAM: NUCLEAR MEDICINE PERFUSION LUNG SCAN TECHNIQUE: Perfusion images were obtained in multiple projections after intravenous injection of radiopharmaceutical. Ventilation scans intentionally deferred if perfusion scan and chest x-ray adequate for interpretation during COVID 19 epidemic. RADIOPHARMACEUTICALS:  4.3 mCi Tc-29mMAA IV COMPARISON:  Chest x-ray on 02/08/2021 FINDINGS: There is normal perfusion. No pleural based wedge-shaped defects to indicate presence of acute pulmonary embolus. IMPRESSION: Normal perfusion. Electronically Signed   By: ENolon NationsM.D.   On: 02/08/2021 15:16   UKoreaVenous Img Lower Bilateral  (DVT)  Result Date: 02/09/2021 CLINICAL DATA:  65year old male with bilateral lower extremity edema, dyspnea, and fever. EXAM: BILATERAL LOWER EXTREMITY VENOUS DOPPLER ULTRASOUND TECHNIQUE: Gray-scale sonography with graded compression, as well as color Doppler and duplex ultrasound were performed to evaluate the lower extremity deep venous systems from the level of the common femoral vein and including the common femoral, femoral, profunda femoral, popliteal and calf veins including the posterior tibial, peroneal and gastrocnemius veins when visible. The superficial great saphenous vein was also interrogated. Spectral Doppler was utilized to evaluate flow at rest and with distal augmentation maneuvers in the common femoral, femoral and popliteal veins. COMPARISON:  Right lower extremity venous duplex from 09/23/2004 FINDINGS: RIGHT LOWER EXTREMITY Common Femoral Vein: No evidence of thrombus. Normal compressibility, respiratory phasicity and response to augmentation. Saphenofemoral Junction: No evidence of thrombus. Normal compressibility and flow on color Doppler imaging. Profunda Femoral Vein: No evidence of thrombus. Normal compressibility and flow on color Doppler imaging. Femoral Vein: No evidence of thrombus. Normal compressibility, respiratory phasicity and response to augmentation. Popliteal Vein: No evidence of thrombus. Normal compressibility, respiratory phasicity and response to augmentation. Calf Veins: Limited evaluation. No evidence of thrombus. Normal compressibility and flow on color Doppler imaging. Superficial Great Saphenous Vein: No evidence of thrombus. Normal compressibility. Other Findings:  Subcutaneous edema is noted. LEFT LOWER EXTREMITY Common Femoral Vein: Limited evaluation. No evidence of thrombus. Normal compressibility, respiratory phasicity and response to augmentation. Saphenofemoral Junction: Limited evaluation. No evidence of thrombus. Normal compressibility and flow on color  Doppler imaging. Profunda Femoral Vein: Limited evaluation. No evidence of thrombus. Normal compressibility and flow on color Doppler imaging. Femoral Vein: No evidence of thrombus. Normal compressibility, respiratory phasicity and response to augmentation. Popliteal Vein: No evidence of thrombus. Normal compressibility, respiratory phasicity and response to augmentation. Calf Veins: Limited evaluation. No evidence of thrombus. Normal compressibility and flow on color Doppler imaging. Other Findings:  Subcutaneous edema is noted. IMPRESSION: No evidence of bilateral lower extremity deep vein thrombosis. Portions of the examination are limited due to subcutaneous edema and body habitus. DRuthann Cancer MD [facility] Electronically Signed  By: Ruthann Cancer M.D.   On: 02/09/2021 11:27   DG CHEST PORT 1 VIEW  Result Date: 02/08/2021 CLINICAL DATA:  Patient admitted for cellulitis to chest wall. EXAM: PORTABLE CHEST 1 VIEW COMPARISON:  February 06, 2021 FINDINGS: Stable cardiomegaly. The hila and mediastinum are unchanged. No pneumothorax. No nodules or masses. Mild interstitial prominence. IMPRESSION: Mild interstitial prominence suggests pulmonary venous congestion/mild pulmonary edema. No other abnormalities or changes. Stable left PICC line. Electronically Signed   By: Dorise Bullion III M.D.   On: 02/08/2021 10:52        Scheduled Meds:  acetaminophen  650 mg Oral Q6H   Or   acetaminophen  650 mg Rectal Q6H   Chlorhexidine Gluconate Cloth  6 each Topical Daily   finasteride  5 mg Oral Daily   fluticasone  1 spray Each Nare Daily   folic acid  1 mg Oral Daily   furosemide  40 mg Oral Daily   gabapentin  100 mg Oral BID   nystatin   Topical BID   rifampin  300 mg Oral TID   saccharomyces boulardii  250 mg Oral BID   sodium chloride flush  3 mL Intravenous Q8H   tamsulosin  0.4 mg Oral QHS   topiramate  100 mg Oral Daily    Continuous Infusions:  ceFEPime (MAXIPIME) IV 2 g (02/09/21 [id number])   DAPTOmycin (CUBICIN)  IV 900 mg (02/08/21 1941)     LOS: 10 days    Time spent: Ellie Lunch, student [facility] Pager [phone]  If 7PM-7AM, please contact night-coverage [email or web address] Password [facility] 02/09/2021, 12:58 PM    ATTENDING NOTE  Patient seen and examined with medical student. In addition to supervising the encounter, I played a key role in the decision making process as well as reviewed key findings.  Appreciate assistance of ID team. Ordered for PICC line removal.  Repeat BC in 48 hours. Continue treatment for pseudomonas.  Dyspnea work up has been completed.  No PE or DVT found.  IV lasix has helped with SOB and edema sensation.  Continue current management.    Gerlene Fee, MD  How to contact the [facility] Attending or Consulting provider [place] or covering provider during after hours [place], for this patient?  Check the care team in [facility] and look for a) attending/consulting [facility] provider listed and b) the [facility] team listed Log into [email or web address] and use [place]'s universal password to access. If you do not have the password, please contact the hospital operator. Locate the [facility] provider you are looking for under [facility] and page to a number that you can be directly reached. If you still have difficulty reaching the provider, please page the [facility] (Director on Call) for the Hospitalists listed on amion for assistance.

## 2021-02-09 NOTE — Progress Notes (Signed)
Patient O2 sat 85%  on Room Air while sleeping. O2 at 2L placed onto patient. Will continue to  monitor.

## 2021-02-10 DIAGNOSIS — R0600 Dyspnea, unspecified: Secondary | ICD-10-CM | POA: Diagnosis not present

## 2021-02-10 DIAGNOSIS — R509 Fever, unspecified: Secondary | ICD-10-CM | POA: Diagnosis not present

## 2021-02-10 DIAGNOSIS — L03313 Cellulitis of chest wall: Secondary | ICD-10-CM | POA: Diagnosis not present

## 2021-02-10 DIAGNOSIS — J9601 Acute respiratory failure with hypoxia: Secondary | ICD-10-CM | POA: Diagnosis not present

## 2021-02-10 LAB — CBC
HCT: 27.4 % — ABNORMAL LOW (ref 39.0–52.0)
Hemoglobin: 8.3 g/dL — ABNORMAL LOW (ref 13.0–17.0)
MCH: 28.3 pg (ref 26.0–34.0)
MCHC: 30.3 g/dL (ref 30.0–36.0)
MCV: 93.5 fL (ref 80.0–100.0)
Platelets: 88 10*3/uL — ABNORMAL LOW (ref 150–400)
RBC: 2.93 MIL/uL — ABNORMAL LOW (ref 4.22–5.81)
RDW: 16.8 % — ABNORMAL HIGH (ref 11.5–15.5)
WBC: 5.8 10*3/uL (ref 4.0–10.5)
nRBC: 0 % (ref 0.0–0.2)

## 2021-02-10 LAB — BASIC METABOLIC PANEL
Anion gap: 6 (ref 5–15)
BUN: 36 mg/dL — ABNORMAL HIGH (ref 8–23)
CO2: 25 mmol/L (ref 22–32)
Calcium: 7.6 mg/dL — ABNORMAL LOW (ref 8.9–10.3)
Chloride: 103 mmol/L (ref 98–111)
Creatinine, Ser: 2.86 mg/dL — ABNORMAL HIGH (ref 0.61–1.24)
GFR, Estimated: 24 mL/min — ABNORMAL LOW (ref >60)
Glucose, Bld: 132 mg/dL — ABNORMAL HIGH (ref 70–99)
Potassium: 4 mmol/L (ref 3.5–5.1)
Sodium: 134 mmol/L — ABNORMAL LOW (ref 135–145)

## 2021-02-10 LAB — CULTURE, BLOOD (ROUTINE X 2): Special Requests: ADEQUATE

## 2021-02-10 LAB — MAGNESIUM: Magnesium: 1.9 mg/dL (ref 1.7–2.4)

## 2021-02-10 LAB — CK: Total CK: 17 U/L — ABNORMAL LOW (ref 49–397)

## 2021-02-10 NOTE — Progress Notes (Signed)
PROGRESS NOTE    Eddie Fletcher  JOA:[id number] DOB: 02-01-1956 DOA: 02/04/2021 PCP: Eddie Rim, MD   Outpatient specialist: Eddie Fester, MD  Infectious disease    Brief Narrative:  Eddie Fletcher is a 65 year old male who initially presented 02/04/2021 with fever, somnolence, severe cellulitis, chf exacerbation. He has a history of: morbid obesity, hypertension, OSA, CKD, UTI, cellulitis, cardiomegaly, staph infection of Right knee prosthesis, possibly starting October 2021, with long term IV antibiotics, then aspirated August 2022 with staph epidermitis, continued on long term antibiotics through a picc line, blood cultures 10/1 revealed no staph epidermitis, present pseudomonal bacteremia. The patient also has extensive cellulitis, which was treated, and then he became febrile 10/1-2, with a newly discovered pseudomonas bacteremia, which has been treated with cefepime. Infectious disease was consulted 10/3, and it was suggested that the patient's picc line may be a source of the pseudomonal bacteremia. Recommendations were made to removed the picc, treat the bacteremia, and repeat blood cultures. Per ID, after 48 hours without the picc, the plan is to repeat blood cultures to assess for continued bacteremia, determining if the picc was the source or potentially the right knee.  Assessment & Plan:   Principal Problem:   Bacteremia due to Pseudomonas -discussed with ID Eddie Fletcher -consider picc as source, remove picc -continue cefepime, started 02/08/21 -repeat blood cultures 48hrs after picc removal  -tunneling vs picc questions asked of Eddie Fletcher, she advised that if this was a brachial placement, this is a PICC. Picc removed around 12noon 02/10/21 -Repeat blood cultures due in 48 hrs  Active Problems:   AKI on CKD stage IV -resumed home lasix -Discontinue further IV fluids -Baseline creatinine 1.8-2.5 -10/4 crt 2.86, egfr 24 -continue to monitor     Acute febrile illness-resolved febrile -pseudomonal bacteremia -treating with cefepime, started 10/2    Obstructive sleep apnea -refuses cpap, uses nasal canula at night     Chronic low back pain -prn pain meds     Morbid obesity with BMI of 60.0-69.9, adult -outpatient lifestyle changes     Pulmonary edema-resolved -currently diuresed, not hypoxemic or tachypneic     Splenomegaly and ascites -Appears stable and near baseline -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 Resume snf placement plans.  03/02/2021 Office Visit Infectious Diseases    Eddie Fletcher, [place] [address] [place] [address]   [phone] (Work)   [phone] (Fax)     Consultants:  Infectious Disease   Procedures:  Echocardiogram US venous IMG lower leg bilateral     Antimicrobials: Augmentin 926-9/30 Linezolid 09/24-26 Rifampin9/26- current Vancomycin d/c 9/27 Azetronam 9/23-9/23 Cefepime 10/2 - current (previous d/c'd on 9/26) Daptomycin 9/27-current Zosyn10/2-10/2   Subjective: He reports he feels well, reports some 'bloating' which he reports is how he describes fluid retention. Compared with previously, he is tremendously less edematous.  Objective: Vitals:   02/09/21 2110 02/09/21 2149 02/10/21 0500 02/10/21 0607  BP: 122/61   (!) 105/59  Pulse: 99 (!) 101  84  Resp:  17  Temp: 98.6 F (37 C)   (!) 97.4 F (36.3 C)  TempSrc: Oral   Oral  SpO2: (!) 85% 95%  96%  Weight:   (!) 182.2 kg   Height:        Intake/Output Summary (Last 24  hours) at 02/10/2021 0845 Last data filed at 02/10/2021 0700 Gross per 24 hour  Intake 780 ml  Output --  Net 780 ml   Filed Weights   02/08/21 0500 02/09/21 0451 02/10/21 0500  Weight: (!) 186.1 kg (!) 186.4 kg (!) 182.2 kg    Examination:  General exam: Appears calm and comfortable, morbidly obese male seated in bed, no acute distress  Respiratory system: Clear to auscultation. Respiratory effort normal. Mild hypoventilation due to abdominal girth.  Cardiovascular system: S1 & S2 heard, RRR. No JVD, murmurs, rubs, gallops or clicks. Minimal pedal edema  Gastrointestinal system: Abdomen is large, nondistended, soft and nontender. No organomegaly or masses felt. Normal bowel sounds heard.  Central nervous system: Alert and oriented. No focal neurological deficits.  Extremities: moves all extremities appropriately, left brachial picc removed and dressed  Skin: No rashes, lesions or ulcers  Psychiatry: Judgement and insight appear normal. Mood & affect appropriate.     Data Reviewed: I have personally reviewed following labs and imaging studies  CBC: Recent Labs  Lab 02/04/21 0500 02/06/21 1227 02/07/21 0515 02/09/21 0512 02/10/21 0534  WBC 7.3 9.3 10.0 8.4 5.8  NEUTROABS  --  7.9* 8.0* 5.6  --   HGB 9.2* 10.3* 8.3* 8.2* 8.3*  HCT 30.3* 33.5* 27.1* 26.8* 27.4*  MCV 91.8 92.0 93.4 93.7 93.5  PLT 87* 110* 73* 78* 88*   Basic Metabolic Panel: Recent Labs  Lab 02/04/21 0500 02/07/21 0515 02/08/21 0421 02/09/21 0512 02/10/21 0534  NA 134* 134* 133* 133* 134*  K 3.5 3.8 3.4* 3.9 4.0  CL 103 103 101 103 103  CO2 _0 GLUCOSE 130* 108* 142* 124* 132*  BUN 31* 30* 32* 35* 36*  CREATININE 2.71* 2.57* 2.77* 2.79* 2.86*  CALCIUM 7.7* 7.5* 7.6* 7.8* 7.6*  MG 2.1  --   --  1.8 1.9   GFR: Estimated Creatinine Clearance: 41 mL/min (A) (by C-G formula based on SCr of 2.86 mg/dL (H)). Liver Function Tests: Recent Labs  Lab 02/07/21 0515 02/08/21 0421   AST 22 25  ALT 17 18  ALKPHOS 99 147*  BILITOT 1.5* 1.3*  PROT 6.3* 7.1  ALBUMIN 1.7* 2.0*   No results for input(s): LIPASE, AMYLASE in the last 168 hours. No results for input(s): AMMONIA in the last 168 hours. Coagulation Profile: No results for input(s): INR, PROTIME in the last 168 hours. Cardiac Enzymes: Recent Labs  Lab 02/10/21 0534  CKTOTAL 17*   BNP (last 3 results) No results for input(s): PROBNP in the last 8760 hours. HbA1C: No results for input(s): HGBA1C in the last 72 hours. CBG: Recent Labs  Lab 02/08/21 0834  GLUCAP 129*   Lipid Profile: No results for input(s): CHOL, HDL, LDLCALC, TRIG, CHOLHDL, LDLDIRECT in the last 72 hours. Thyroid Function Tests: No results for input(s): TSH, T4TOTAL, FREET4, T3FREE, THYROIDAB in the last 72 hours. Anemia Panel: No results for input(s): VITAMINB12, FOLATE, FERRITIN, TIBC, IRON, RETICCTPCT in the last 72 hours. Urine analysis:    Component Value Date/Time   COLORURINE YELLOW 02/06/2021 1506   APPEARANCEUR CLEAR 02/06/2021 1506   LABSPEC 1.010 02/06/2021 1506   PHURINE 5.0 02/06/2021 1506   GLUCOSEU  NEGATIVE 02/06/2021 1506   HGBUR SMALL (A) 02/06/2021 1506   BILIRUBINUR NEGATIVE 02/06/2021 [place] 02/06/2021 1506   PROTEINUR 30 (A) 02/06/2021 1506   UROBILINOGEN 0.2 01/08/2015 2044   NITRITE NEGATIVE 02/06/2021 [place] 02/06/2021 1506   Sepsis Labs: _0 (procalcitonin:4,lacticidven:4)  ) Recent Results (from the past 240 hour(s))  Culture, blood (routine x 2)     Status: Abnormal   Collection Time: 02/06/21 12:27 PM   Specimen: Right Antecubital; Blood  Result Value Ref Range Status   Specimen Description   Final    RIGHT ANTECUBITAL BOTTLES DRAWN AEROBIC AND ANAEROBIC Performed at [facility], [address], [place], [place] [address]    Special Requests   Final    Blood Culture adequate volume Performed at [facility], [address],  [place], [place] [address]    Culture  Setup Time   Final    AEROBIC BOTTLE GRAM NEGATIVE RODS CRITICAL RESULT CALLED TO, READ BACK BY AND VERIFIED WITH: BROWN,S 2329 02/07/2021 [place],R CRITICAL RESULT CALLED TO, READ BACK BY AND VERIFIED WITH: S BROWN,RN_1  02/08/21 [place] Performed at [facility], [place] [address] [place], [place] [address]    Culture PSEUDOMONAS AERUGINOSA (A)  Final   Report Status 02/10/2021 FINAL  Final   Organism ID, Bacteria PSEUDOMONAS AERUGINOSA  Final      Susceptibility   Pseudomonas aeruginosa - MIC*    CEFTAZIDIME 4 SENSITIVE Sensitive     CIPROFLOXACIN <=0.25 SENSITIVE Sensitive     GENTAMICIN <=1 SENSITIVE Sensitive     IMIPENEM 1 SENSITIVE Sensitive     CEFEPIME 2 SENSITIVE Sensitive     * PSEUDOMONAS AERUGINOSA  Culture, blood (routine x 2)     Status: None (Preliminary result)   Collection Time: 02/06/21 12:27 PM   Specimen: BLOOD RIGHT HAND  Result Value Ref Range Status   Specimen Description BLOOD RIGHT HAND BOTTLES DRAWN AEROBIC ONLY  Final   Special Requests Blood Culture adequate volume  Final   Culture   Final    NO GROWTH 4 DAYS Performed at [facility], [address] [place], [place] [address]    Report Status PENDING  Incomplete  Blood Culture ID Panel (Reflexed)     Status: Abnormal   Collection Time: 02/06/21 12:27 PM  Result Value Ref Range Status   Enterococcus faecalis NOT DETECTED NOT DETECTED Final   Enterococcus Faecium NOT DETECTED NOT DETECTED Final   Listeria monocytogenes NOT DETECTED NOT DETECTED Final   Staphylococcus species NOT DETECTED NOT DETECTED Final   Staphylococcus aureus (BCID) NOT DETECTED NOT DETECTED Final   Staphylococcus epidermidis NOT DETECTED NOT DETECTED Final   Staphylococcus lugdunensis NOT DETECTED NOT DETECTED Final   Streptococcus species NOT DETECTED NOT DETECTED Final   Streptococcus agalactiae NOT DETECTED NOT DETECTED Final   Streptococcus pneumoniae NOT DETECTED NOT DETECTED Final    Streptococcus pyogenes NOT DETECTED NOT DETECTED Final   A.calcoaceticus-baumannii NOT DETECTED NOT DETECTED Final   Bacteroides fragilis NOT DETECTED NOT DETECTED Final   Enterobacterales NOT DETECTED NOT DETECTED Final   Enterobacter cloacae complex NOT DETECTED NOT DETECTED Final   Escherichia coli NOT DETECTED NOT DETECTED Final   Klebsiella aerogenes NOT DETECTED NOT DETECTED Final   Klebsiella oxytoca NOT DETECTED NOT DETECTED Final   Klebsiella pneumoniae NOT DETECTED NOT DETECTED Final   Proteus species NOT DETECTED NOT DETECTED Final   Salmonella species NOT DETECTED NOT DETECTED Final   Serratia marcescens NOT DETECTED NOT  DETECTED Final   Haemophilus influenzae NOT DETECTED NOT DETECTED Final   Neisseria meningitidis NOT DETECTED NOT DETECTED Final   Pseudomonas aeruginosa DETECTED (A) NOT DETECTED Final    Comment: CRITICAL RESULT CALLED TO, READ BACK BY AND VERIFIED WITH: S BROWN,RN_0  02/08/21 [place]    Stenotrophomonas maltophilia NOT DETECTED NOT DETECTED Final   Candida albicans NOT DETECTED NOT DETECTED Final   Candida auris NOT DETECTED NOT DETECTED Final   Candida glabrata NOT DETECTED NOT DETECTED Final   Candida krusei NOT DETECTED NOT DETECTED Final   Candida parapsilosis NOT DETECTED NOT DETECTED Final   Candida tropicalis NOT DETECTED NOT DETECTED Final   Cryptococcus neoformans/gattii NOT DETECTED NOT DETECTED Final   CTX-M ESBL NOT DETECTED NOT DETECTED Final   Carbapenem resistance IMP NOT DETECTED NOT DETECTED Final   Carbapenem resistance KPC NOT DETECTED NOT DETECTED Final   Carbapenem resistance NDM NOT DETECTED NOT DETECTED Final   Carbapenem resistance VIM NOT DETECTED NOT DETECTED Final    Comment: Performed at [facility], [address] [place], [place] [address]         Radiology Studies: NM Pulmonary Perfusion  Result Date: 02/08/2021 CLINICAL DATA:  Suspected pulmonary embolus. Low-intermediate probability. EXAM: NUCLEAR MEDICINE  PERFUSION LUNG SCAN TECHNIQUE: Perfusion images were obtained in multiple projections after intravenous injection of radiopharmaceutical. Ventilation scans intentionally deferred if perfusion scan and chest x-ray adequate for interpretation during COVID 19 epidemic. RADIOPHARMACEUTICALS:  4.3 mCi Tc-39mMAA IV COMPARISON:  Chest x-ray on 02/08/2021 FINDINGS: There is normal perfusion. No pleural based wedge-shaped defects to indicate presence of acute pulmonary embolus. IMPRESSION: Normal perfusion. Electronically Signed   By: ENolon NationsM.D.   On: 02/08/2021 15:16   UKoreaVenous Img Lower Bilateral (DVT)  Result Date: 02/09/2021 CLINICAL DATA:  65year old male with bilateral lower extremity edema, dyspnea, and fever. EXAM: BILATERAL LOWER EXTREMITY VENOUS DOPPLER ULTRASOUND TECHNIQUE: Gray-scale sonography with graded compression, as well as color Doppler and duplex ultrasound were performed to evaluate the lower extremity deep venous systems from the level of the common femoral vein and including the common femoral, femoral, profunda femoral, popliteal and calf veins including the posterior tibial, peroneal and gastrocnemius veins when visible. The superficial great saphenous vein was also interrogated. Spectral Doppler was utilized to evaluate flow at rest and with distal augmentation maneuvers in the common femoral, femoral and popliteal veins. COMPARISON:  Right lower extremity venous duplex from 09/23/2004 FINDINGS: RIGHT LOWER EXTREMITY Common Femoral Vein: No evidence of thrombus. Normal compressibility, respiratory phasicity and response to augmentation. Saphenofemoral Junction: No evidence of thrombus. Normal compressibility and flow on color Doppler imaging. Profunda Femoral Vein: No evidence of thrombus. Normal compressibility and flow on color Doppler imaging. Femoral Vein: No evidence of thrombus. Normal compressibility, respiratory phasicity and response to augmentation. Popliteal Vein: No  evidence of thrombus. Normal compressibility, respiratory phasicity and response to augmentation. Calf Veins: Limited evaluation. No evidence of thrombus. Normal compressibility and flow on color Doppler imaging. Superficial Great Saphenous Vein: No evidence of thrombus. Normal compressibility. Other Findings:  Subcutaneous edema is noted. LEFT LOWER EXTREMITY Common Femoral Vein: Limited evaluation. No evidence of thrombus. Normal compressibility, respiratory phasicity and response to augmentation. Saphenofemoral Junction: Limited evaluation. No evidence of thrombus. Normal compressibility and flow on color Doppler imaging. Profunda Femoral Vein: Limited evaluation. No evidence of thrombus. Normal compressibility and flow on color Doppler imaging. Femoral Vein: No evidence of thrombus. Normal compressibility, respiratory phasicity and response to augmentation. Popliteal Vein: No  evidence of thrombus. Normal compressibility, respiratory phasicity and response to augmentation. Calf Veins: Limited evaluation. No evidence of thrombus. Normal compressibility and flow on color Doppler imaging. Other Findings:  Subcutaneous edema is noted. IMPRESSION: No evidence of bilateral lower extremity deep vein thrombosis. Portions of the examination are limited due to subcutaneous edema and body habitus. Ruthann Cancer, MD [facility] Electronically Signed   By: Ruthann Cancer M.D.   On: 02/09/2021 11:27   DG CHEST PORT 1 VIEW  Result Date: 02/08/2021 CLINICAL DATA:  Patient admitted for cellulitis to chest wall. EXAM: PORTABLE CHEST 1 VIEW COMPARISON:  February 06, 2021 FINDINGS: Stable cardiomegaly. The hila and mediastinum are unchanged. No pneumothorax. No nodules or masses. Mild interstitial prominence. IMPRESSION: Mild interstitial prominence suggests pulmonary venous congestion/mild pulmonary edema. No other abnormalities or changes. Stable left PICC line.  Electronically Signed   By: Dorise Bullion III M.D.   On: 02/08/2021 10:52        Scheduled Meds:  acetaminophen  650 mg Oral Q6H   Or   acetaminophen  650 mg Rectal Q6H   Chlorhexidine Gluconate Cloth  6 each Topical Daily   finasteride  5 mg Oral Daily   fluticasone  1 spray Each Nare Daily   folic acid  1 mg Oral Daily   furosemide  40 mg Oral Daily   gabapentin  100 mg Oral BID   nystatin   Topical BID   rifampin  300 mg Oral TID   saccharomyces boulardii  250 mg Oral BID   sodium chloride flush  3 mL Intravenous Q8H   tamsulosin  0.4 mg Oral QHS   topiramate  100 mg Oral Daily   Continuous Infusions:  ceFEPime (MAXIPIME) IV 2 g (02/10/21 0745)   DAPTOmycin (CUBICIN)  IV 900 mg (02/09/21 1806)     LOS: 11 days     Tangelia Sanson D Livianna Petraglia, student [facility] Pager [phone]  If 7PM-7AM, please contact night-coverage [email or web address] Password [email or web address] 02/10/2021, 8:45 AM

## 2021-02-10 NOTE — Progress Notes (Signed)
PICC line removed.  Catheter length removed 43 cm.

## 2021-02-10 NOTE — TOC Progression Note (Signed)
Transition of Care [facility]) - Progression Note    Patient Details  Name: Eddie Fletcher MRN: [id number] Date of Birth: [birth year]  [facility] CM/SW Contact  Boneta Lucks, RN Phone Number: 02/10/2021, 11:09 AM  Clinical Narrative:   TOC still waiting on INS AUTH for [facility]. Patient is now not medically ready. Needs PICC line removal and Cultures.   Expected Discharge Plan: Skilled Nursing Facility Barriers to Discharge: Ship broker  Expected Discharge Plan and Services Expected Discharge Plan: [facility] In-house Referral: Clinical Social Work   Post Acute Care Choice: [place] Living arrangements for the past 2 months: [place]

## 2021-02-11 DIAGNOSIS — L03313 Cellulitis of chest wall: Secondary | ICD-10-CM | POA: Diagnosis not present

## 2021-02-11 LAB — CBC
HCT: 27.5 % — ABNORMAL LOW (ref 39.0–52.0)
Hemoglobin: 8.5 g/dL — ABNORMAL LOW (ref 13.0–17.0)
MCH: 29.1 pg (ref 26.0–34.0)
MCHC: 30.9 g/dL (ref 30.0–36.0)
MCV: 94.2 fL (ref 80.0–100.0)
Platelets: 95 10*3/uL — ABNORMAL LOW (ref 150–400)
RBC: 2.92 MIL/uL — ABNORMAL LOW (ref 4.22–5.81)
RDW: 16.9 % — ABNORMAL HIGH (ref 11.5–15.5)
WBC: 5.7 10*3/uL (ref 4.0–10.5)
nRBC: 0 % (ref 0.0–0.2)

## 2021-02-11 LAB — MAGNESIUM: Magnesium: 1.8 mg/dL (ref 1.7–2.4)

## 2021-02-11 LAB — BASIC METABOLIC PANEL
Anion gap: 8 (ref 5–15)
BUN: 36 mg/dL — ABNORMAL HIGH (ref 8–23)
CO2: 25 mmol/L (ref 22–32)
Calcium: 7.7 mg/dL — ABNORMAL LOW (ref 8.9–10.3)
Chloride: 104 mmol/L (ref 98–111)
Creatinine, Ser: 2.78 mg/dL — ABNORMAL HIGH (ref 0.61–1.24)
GFR, Estimated: 25 mL/min — ABNORMAL LOW (ref >60)
Glucose, Bld: 114 mg/dL — ABNORMAL HIGH (ref 70–99)
Potassium: 3.9 mmol/L (ref 3.5–5.1)
Sodium: 137 mmol/L (ref 135–145)

## 2021-02-11 LAB — CULTURE, BLOOD (ROUTINE X 2)
Culture: NO GROWTH
Special Requests: ADEQUATE

## 2021-02-11 MED ORDER — RIFAMPIN 300 MG PO CAPS
300.0000 mg | ORAL_CAPSULE | Freq: Two times a day (BID) | ORAL | Status: DC
  Administered 2021-02-11 – 2021-02-14 (×5): 300 mg via ORAL
  Filled 2021-02-11 (×11): qty 1
  Filled 2021-02-11: qty 6
  Filled 2021-02-11 (×7): qty 1

## 2021-02-11 NOTE — Progress Notes (Signed)
Physical Therapy Treatment Patient Details Name: Eddie Fletcher MRN: [id number] DOB: Jul 17, 1955 Today's Date: 02/11/2021   History of Present Illness Eddie Fletcher is a 65 y.o. male with medical history significant for hypertension, chronic back pain, multiple falls, sleep apnea, morbid obesity, BPH who presents to the emergency department from [facility] via EMS due to 1 day onset of fever and was also noted to be somewhat more somnolent.  He is being actively treated with IV vancomycin for cellulitis of the scrotum and legs and the concern was his febrile episode as well as some hypoxemia.    PT Comments    Pt friendly and willing to participate with therapy today.  Pt limited by generalized weakness and required mod A with bed mobility and transfer training.  Pt c/o dizziness upon sitting EOB that was resolved in 2 minutes.  Pt easily fatigued and required periodic seated rest breaks through session.  Ability to stand 25"-30" max prior need to sit.  EOS pt left in bed, cueing for handplacement and assistance required to move to [facility].  Pt with call bell within reach at EOS.    Recommendations for follow up therapy are one component of a multi-disciplinary discharge planning process, led by the attending physician.  Recommendations may be updated based on patient status, additional functional criteria and insurance authorization.  Follow Up Recommendations  SNF     Equipment Recommendations  None recommended by PT    Recommendations for Other Services       Precautions / Restrictions Precautions Precautions: Fall Restrictions Weight Bearing Restrictions: No     Mobility  Bed Mobility Overal bed mobility: Needs Assistance Bed Mobility: Supine to Sit;Sit to Supine     Supine to sit: Min assist;Mod assist;HOB elevated Sit to supine: Mod assist   General bed mobility comments: had difficulty sitting up at bedside requiring repeated attempts and use of bed rail with HOB rasied,  reports of mild dizziness for 2 min following initial sitting    Transfers Overall transfer level: Needs assistance Equipment used: Rolling walker (2 wheeled) Transfers: Sit to/from Stand Sit to Stand: Min assist;Mod assist         General transfer comment: min/mod assist to power up with RW  Ambulation/Gait             General Gait Details: limited to walking in place wiht ability to stand for up to 25-30" then need to rest.   Stairs             Wheelchair Mobility    Modified Rankin (Stroke Patients Only)       Balance                                            Cognition Arousal/Alertness: Awake/alert Behavior During Therapy: WFL for tasks assessed/performed Overall Cognitive Status: Within Functional Limits for tasks assessed                                        Exercises Total Joint Exercises Ankle Circles/Pumps: AROM;Both;Seated Long Arc Quad: Both;5 reps;Seated    General Comments        Pertinent Vitals/Pain Pain Assessment: 0-10 Pain Intervention(s): Limited activity within patient's tolerance;Monitored during session;Repositioned    Home Living  Prior Function            PT Goals (current goals can now be found in the care plan section)      Frequency    Min 3X/week      PT Plan Current plan remains appropriate    Co-evaluation              AM-PAC PT "6 Clicks" Mobility   Outcome Measure  Help needed turning from your back to your side while in a flat bed without using bedrails?: A Little Help needed moving from lying on your back to sitting on the side of a flat bed without using bedrails?: A Lot Help needed moving to and from a bed to a chair (including a wheelchair)?: A Lot Help needed standing up from a chair using your arms (e.g., wheelchair or bedside chair)?: A Lot Help needed to walk in hospital room?: Total Help needed climbing 3-5 steps  with a railing? : Total 6 Click Score: 11    End of Session Equipment Utilized During Treatment: Gait belt Activity Tolerance: Patient tolerated treatment well;Patient limited by fatigue Patient left: in bed;with call bell/phone within reach;with bed alarm set Nurse Communication: Mobility status PT Visit Diagnosis: Difficulty in walking, not elsewhere classified (R26.2);Muscle weakness (generalized) (M62.81);Unsteadiness on feet (R26.81)     Time: [id number] PT Time Calculation (min) (ACUTE ONLY): 30 min  Charges:  $Therapeutic Exercise: 8-22 mins $Therapeutic Activity: 8-22 mins                    Ihor Austin, LPTA/CLT; CBIS [phone]  Aldona Lento 02/11/2021, 4:44 PM

## 2021-02-11 NOTE — Progress Notes (Addendum)
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 He was discharged on vancomycin plus rifampin for 6 wk through 02/25/21. He had picc line replaced on 9/14 due to migrating out of place/dysfunction. He subsequently was subsequently hospitalized at [facility] on 9/23 for fever, AMS, hypoxia, with exam showing cellulitis to chest wall and genitalia. His imaging concerning for CHF/pulmonary edema. Initial blood cx NGTD, he then started to have fever again on 9/30, with repeat blood cx now showing PsA in 1 of 4 bottles. Due to concern for picc line infection, his picc line removed on 10/4. His initial cellulitis is resolved. Now afebrile.   Historic Micro:  01/07/21 blood cx NGTD 01/08/21 urine cx MSSE 01/14/21 -right knee synovial fluid-MSSE  4 wk ago Resulting Agency Comments  Culture  Staphylococcus epidermidis Abnormal   [facility] Isolated from both media  Gram Stain Result  Moderate White blood cells  [place]   Gram Stain Result  No organisms seen  [place]   Susceptibility  Organism Antibiotic Method Susceptibility  Staphylococcus epidermidis Cefazolin MIC Susceptible (D)  Staphylococcus epidermidis Clindamycin MIC >=4 ug/mL: Resistant  Staphylococcus epidermidis Doxycycline MIC 8 ug/mL: Intermediate  Staphylococcus epidermidis Erythromycin MIC <=0.25 ug/mL: Susceptible  Staphylococcus epidermidis Gentamicin MIC <=0.5 ug/mL: Susceptible  Staphylococcus epidermidis Levofloxacin MIC >=8 ug/mL: Resistant  Staphylococcus epidermidis Oxacillin MIC <=0.25 ug/mL: Susceptible  Staphylococcus epidermidis Rifampin MIC <=0.5 ug/mL: Susceptible  Staphylococcus epidermidis Tetracycline MIC >=16 ug/mL: Resistant   Staphylococcus epidermidis Trimethoprim + Sulfamethoxazole MIC 80 ug/mL: Resistant  Staphylococcus epidermidis Vancomycin MIC 1 ug/mL: Susceptible  Staphylococcus epidermidis Nafcillin MIC Susceptible (D)   A/P: MSSE chronic right knee PJI --  - continue with abtx that would cover MSSE. Currently on cefepime -for PsA bacteremia - continue with rifampin and will dose to 300 mg BID - initial end date of IV therapy is 10/19, then will switch to orals -- and refer back to his primary ID team - no longer needs daptomycin (previously was reported as MRSE but this latest infection is MSSE)  PsA bacteremia - recommend to repeat blood cx today - continue on cefepime for the time being. Ensure blood cx are clearing

## 2021-02-11 NOTE — Progress Notes (Signed)
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 on [id number] from [facility] due to fever, somnolence and confusion.  In the emergency department, patient was notably tachycardic, febrile with a temperature of 1 1.0 F, BP 118/39, SPO2 97% on 2 L nasal cannula.  Lab work-up notable for leukocytosis, normocytic anemia, creatinine 2.63, influenza A/B and COVID-19 PCR negative.  Chest x-ray with cardiomegaly with findings suggestive of CHF/pulmonary edema. CT chest without contrast with marked severity cellulitis along the lateral aspect of the mid and lower chest wall, marked severity splenomegaly.  Patient was started on IV Azactam, Tylenol given due to fever and started on IV fluid hydration. TRH consulted for further evaluation and management of sepsis.    Assessment & Plan:   Principal Problem:   Cellulitis of chest wall Active Problems:   Obstructive sleep apnea   Chronic low back pain   Morbid obesity with BMI of 60.0-69.9, adult (HCC)   Multiple falls   Elevated brain natriuretic peptide (BNP) level   Pulmonary edema   Splenomegaly, not elsewhere classified   Ascites   Chronic venous stasis dermatitis of both lower extremities   Chronic kidney disease   Acute febrile illness   Acute respiratory failure with hypoxia (HCC)   Cellulitis of scrotum   Bacteremia due to Pseudomonas   Acute dyspnea   Pseudomonas septicemia Patient during hospitalization continued with intermittent fevers.  Currently had PICC line in place for outpatient antibiotics for  recent right prosthetic knee infection.  Source of infection likely PICC line.  PICC line was removed on 02/10/2021. --Infectious disease following, appreciate assistance --Continue cefepime 2g IV q12h, started 02/08/2021 --Repeat blood cultures x2 today --Continue to monitor fever curve, CBC  Right knee prostatic joint infection Patient developed MSSE infection of right knee prosthesis, evaluated at [place].  Discharged on long-term antibiotics for 6 weeks to end 02/25/2021 via PICC line with vancomycin and rifampin. --started on daptomycin at [facility], now discontinued by ID --Continue rifampin 500 mg p.o. twice daily --Outpatient follow-up with [facility]  Acute renal failure on CKD stage IV Baseline creatinine 1.8-2.5.  Received IV fluid hydration. --Cr 2.63>>3.08>2.57>2.79>2.86>2.78 --Avoid nephrotoxins, renal dose all medications --BMP in a.m.  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  Appears stable and at baseline. --Continue diuresis as above  Chronic venous stasis dermatitis bilateral lower extremities, chronic --Supportive care --Lower extremity elevation --Furosemide 40 mg p.o. daily  Essential hypertension --Furosemide 40 mg p.o. daily  BPH: Finasteride 5 mg p.o. daily, tamsulosin 0.4 mg nightly  Chest wall cellulitis: Resolved Scrotal cellulitis: Resolved  Morbid obesity Body mass index is 65.58 kg/m.  Discussed with patient needs for aggressive lifestyle changes/weight loss as this complicates all facets of care.  Outpatient follow-up with PCP.  May benefit from bariatric evaluation outpatient.  Weakness/deconditioning/debility: From [facility].  Seen by PT/OT recommending return to SNF. --Pending insurance authorization for [place], [place]    DVT prophylaxis: Place and maintain sequential compression device Start:  02/03/21 1019 SCDs Start: 02/02/2021 2256   Code Status: Full Code Family Communication: No family present at bedside this morning  Disposition Plan:  Level of care: Med-Surg Status is: Inpatient  Remains inpatient appropriate because:Ongoing diagnostic testing needed not appropriate for outpatient work up, Unsafe d/c plan, IV treatments appropriate due to intensity of illness or inability to take PO, and Inpatient level of care appropriate due to severity of illness  Dispo: The patient is from: SNF              Anticipated d/c is to: SNF              Patient currently is not medically stable to d/c.   Difficult to place patient No   Consultants:  Infectious disease, Dr. Baxter Flattery  Procedures:  PICC line removal 10/4  Antimicrobials:  Cefepime 9/23 - 9/26, 10/2>>  Rifampin 9/26>> Daptomycin 9/27 - 10/5 Aztreonam 9/20 3-01/2022 Vancomycin 9/23 Linezolid 9/23-9/26 Azithromycin 9/26 - 9/30 Zosyn 10/2 - 10/2  Anti-infectives (From admission, onward)    Start     Dose/Rate Route Frequency Ordered Stop   02/11/21 2200  rifampin (RIFADIN) capsule 300 mg        300 mg Oral Every 12 hours 02/11/21 0925     02/08/21 2000  ceFEPIme (MAXIPIME) 2 g in sodium chloride 0.9 % 100 mL IVPB        2 g 200 mL/hr over 30 Minutes Intravenous Every 12 hours 02/08/21 1302     02/08/21 1200  piperacillin-tazobactam (ZOSYN) IVPB 3.375 g  Status:  Discontinued        3.375 g 12.5 mL/hr over 240 Minutes Intravenous Every 8 hours 02/08/21 0558 02/08/21 1302   02/08/21 0600  piperacillin-tazobactam (ZOSYN) IVPB 3.375 g        3.375 g 100 mL/hr over 30 Minutes Intravenous STAT 02/08/21 0558 02/08/21 0716   02/03/21 2000  DAPTOmycin (CUBICIN) 900 mg in sodium chloride 0.9 % IVPB  Status:  Discontinued        900 mg 136 mL/hr over 30 Minutes Intravenous Daily 02/03/21 1516 02/11/21 0925   02/02/21 1410  vancomycin variable dose per unstable renal function (pharmacist dosing)  Status:  Discontinued          Does not apply See admin instructions 02/02/21 1410 02/03/21 1516   02/02/21 1400  rifampin (RIFADIN) capsule 300 mg  Status:  Discontinued        300 mg Oral 3 times daily 02/02/21 1106 02/11/21 0925   02/02/21 1330  amoxicillin-clavulanate (AUGMENTIN) 875-125 MG per tablet 1 tablet        1 tablet Oral Every 12 hours 02/02/21 1238 02/06/21 2026   02/02/21 1000  rifampin (RIFADIN) capsule 450 mg  Status:  Discontinued        450 mg Oral Every 12 hours 02/02/21 0701 02/02/21 1106   02/02/21 0830  vancomycin (VANCOREADY) IVPB 2000 mg/400 mL        2,000 mg 200 mL/hr over 120 Minutes Intravenous  Once 02/02/21 0803 02/02/21 1547   01/31/21 0330  ceFEPIme (MAXIPIME) 2 g in sodium chloride 0.9 % 100 mL IVPB  Status:  Discontinued        2 g 200 mL/hr over 30 Minutes Intravenous Every 12 hours 02/03/2021 2214 02/02/21 0701   01/31/21 0030  linezolid (ZYVOX) tablet 600 mg  Status:  Discontinued        600 mg Oral Every 12 hours 01/29/2021 2344 02/02/21 0701   01/28/2021 2330  vancomycin (VANCOREADY) IVPB 1500 mg/300 mL  Status:  Discontinued       See Hyperspace for full Linked Orders Report.   1,500 mg 150 mL/hr over 120 Minutes Intravenous  Once 01/08/2021 2148 01/09/2021 2151   01/10/2021 2230  vancomycin (VANCOCIN) IVPB 1000 mg/200 mL premix  Status:  Discontinued       See Hyperspace for full Linked Orders Report.   1,000 mg 200 mL/hr over 60 Minutes Intravenous  Once 02/05/2021 2148 01/17/2021 2151   01/20/2021 2145  vancomycin (VANCOCIN) IVPB 1000 mg/200 mL premix  Status:  Discontinued        1,000 mg 200 mL/hr over 60 Minutes Intravenous  Once 01/24/2021 2140 01/29/2021 2145   01/10/2021 2145  ceFEPIme (MAXIPIME) 2 g in sodium chloride 0.9 % 100 mL IVPB  Status:  Discontinued        2 g 200 mL/hr over 30 Minutes Intravenous  Once 01/18/2021 2140 02/06/2021 2212   01/29/2021 1830  aztreonam (AZACTAM) 2 g in sodium chloride 0.9 % 100 mL IVPB        2 g 200 mL/hr over 30 Minutes Intravenous  Once 02/05/2021 1821  01/13/2021 2034         Subjective: Patient seen examined at bedside, resting comfortably.  No specific complaints this morning.  PICC line removed yesterday.  Discussed with ID, Dr. Baxter Flattery via telephone this morning hopeful for transition to oral antibiotics.  She requests repeat blood cultures today.  Questions or concerns at this time.  Denies headache, no current fever/chills/night sweats, no nausea/vomiting/diarrhea, no chest pain, palpitations, no shortness of breath, no abdominal pain.  No acute events overnight per nursing staff.  Objective: Vitals:   02/11/21 0504 02/11/21 0506 02/11/21 0512 02/11/21 1325  BP:  110/63  (!) 127/59  Pulse: 85 86  87  Resp: 19   20  Temp: 98 F (36.7 C)   98.9 F (37.2 C)  TempSrc: Oral   Oral  SpO2: (!) 87%   93%  Weight:   (!) 184.3 kg   Height:        Intake/Output Summary (Last 24 hours) at 02/11/2021 1451 Last data filed at 02/11/2021 0514 Gross per 24 hour  Intake 876 ml  Output 951 ml  Net -75 ml   Filed Weights   02/09/21 0451 02/10/21 0500 02/11/21 0512  Weight: (!) 186.4 kg (!) 182.2 kg (!) 184.3 kg    Examination:  General exam: Appears calm and comfortable, morbidly obese Respiratory system: Clear to auscultation. Respiratory effort normal.  On room air Cardiovascular system: S1 & S2 heard, RRR. No JVD, murmurs, rubs, gallops or clicks. No pedal edema. Gastrointestinal system: Abdomen is nondistended, abdomen protuberant, soft and nontender. No organomegaly or masses felt. Normal bowel sounds heard. GU: Lacey erythema to groin and intertriginous areas Central nervous system: Alert and oriented. No focal neurological deficits. Extremities: Symmetric 5 x 5 power. Skin: Bilateral lower extremities with chronic venous changes, no purulence or concerning erythema, chronic edema Psychiatry: Judgement and insight appear poor. Mood & affect appropriate.     Data Reviewed: I have personally reviewed following labs and imaging  studies  CBC: Recent Labs  Lab 02/06/21 1227 02/07/21 0515 02/09/21 0512 02/10/21 0534 02/11/21 0523  WBC 9.3 10.0 8.4 5.8 5.7  NEUTROABS 7.9* 8.0* 5.6  --   --   HGB 10.3* 8.3* 8.2* 8.3* 8.5*  HCT 33.5* 27.1* 26.8* 27.4* 27.5*  MCV 92.0 93.4 93.7 93.5 94.2  PLT 110* 73* 78*  88* 95*   Basic Metabolic Panel: Recent Labs  Lab 02/07/21 0515 02/08/21 0421 02/09/21 0512 02/10/21 0534 02/11/21 0523  NA 134* 133* 133* 134* 137  K 3.8 3.4* 3.9 4.0 3.9  CL 103 101 103 103 104  CO2 '25 25 25 25 25  '$ GLUCOSE 108* 142* 124* 132* 114*  BUN 30* 32* 35* 36* 36*  CREATININE 2.57* 2.77* 2.79* 2.86* 2.78*  CALCIUM 7.5* 7.6* 7.8* 7.6* 7.7*  MG  --   --  1.8 1.9 1.8   GFR: Estimated Creatinine Clearance: 42.5 mL/min (A) (by C-G formula based on SCr of 2.78 mg/dL (H)). Liver Function Tests: Recent Labs  Lab 02/07/21 0515 02/08/21 0421  AST 22 25  ALT 17 18  ALKPHOS 99 147*  BILITOT 1.5* 1.3*  PROT 6.3* 7.1  ALBUMIN 1.7* 2.0*   No results for input(s): LIPASE, AMYLASE in the last 168 hours. No results for input(s): AMMONIA in the last 168 hours. Coagulation Profile: No results for input(s): INR, PROTIME in the last 168 hours. Cardiac Enzymes: Recent Labs  Lab 02/10/21 0534  CKTOTAL 17*   BNP (last 3 results) No results for input(s): PROBNP in the last 8760 hours. HbA1C: No results for input(s): HGBA1C in the last 72 hours. CBG: Recent Labs  Lab 02/08/21 0834  GLUCAP 129*   Lipid Profile: No results for input(s): CHOL, HDL, LDLCALC, TRIG, CHOLHDL, LDLDIRECT in the last 72 hours. Thyroid Function Tests: No results for input(s): TSH, T4TOTAL, FREET4, T3FREE, THYROIDAB in the last 72 hours. Anemia Panel: No results for input(s): VITAMINB12, FOLATE, FERRITIN, TIBC, IRON, RETICCTPCT in the last 72 hours. Sepsis Labs: Recent Labs  Lab 02/06/21 1756 02/07/21 0515 02/08/21 0421  PROCALCITON 5.28 7.39 6.62    Recent Results (from the past 240 hour(s))  Culture,  blood (routine x 2)     Status: Abnormal   Collection Time: 02/06/21 12:27 PM   Specimen: Right Antecubital; Blood  Result Value Ref Range Status   Specimen Description   Final    RIGHT ANTECUBITAL BOTTLES DRAWN AEROBIC AND ANAEROBIC Performed at [facility], [address] [place], [place] [address]    Special Requests   Final    Blood Culture adequate volume Performed at [facility], [address], [place], [place] [address]    Culture  Setup Time   Final    AEROBIC BOTTLE GRAM NEGATIVE RODS CRITICAL RESULT CALLED TO, READ BACK BY AND VERIFIED WITH: BROWN,S 2329 02/07/2021 [place],R CRITICAL RESULT CALLED TO, READ BACK BY AND VERIFIED WITH: S BROWN,RN'@0448'$  02/08/21 [place] Performed at [facility], [place] [address], [place], [place] [address]    Culture PSEUDOMONAS AERUGINOSA (A)  Final   Report Status 02/10/2021 FINAL  Final   Organism ID, Bacteria PSEUDOMONAS AERUGINOSA  Final      Susceptibility   Pseudomonas aeruginosa - MIC*    CEFTAZIDIME 4 SENSITIVE Sensitive     CIPROFLOXACIN <=0.25 SENSITIVE Sensitive     GENTAMICIN <=1 SENSITIVE Sensitive     IMIPENEM 1 SENSITIVE Sensitive     CEFEPIME 2 SENSITIVE Sensitive     * PSEUDOMONAS AERUGINOSA  Culture, blood (routine x 2)     Status: None   Collection Time: 02/06/21 12:27 PM   Specimen: BLOOD RIGHT HAND  Result Value Ref Range Status   Specimen Description BLOOD RIGHT HAND BOTTLES DRAWN AEROBIC ONLY  Final   Special Requests Blood Culture adequate volume  Final   Culture   Final    NO GROWTH  5 DAYS Performed at [facility], [address], [place], [place] [address]    Report Status 02/11/2021 FINAL  Final  Blood Culture ID Panel (Reflexed)     Status: Abnormal   Collection Time: 02/06/21 12:27 PM  Result Value Ref Range Status   Enterococcus faecalis NOT DETECTED NOT DETECTED Final   Enterococcus Faecium NOT DETECTED NOT DETECTED Final   Listeria monocytogenes NOT DETECTED NOT DETECTED Final   Staphylococcus  species NOT DETECTED NOT DETECTED Final   Staphylococcus aureus (BCID) NOT DETECTED NOT DETECTED Final   Staphylococcus epidermidis NOT DETECTED NOT DETECTED Final   Staphylococcus lugdunensis NOT DETECTED NOT DETECTED Final   Streptococcus species NOT DETECTED NOT DETECTED Final   Streptococcus agalactiae NOT DETECTED NOT DETECTED Final   Streptococcus pneumoniae NOT DETECTED NOT DETECTED Final   Streptococcus pyogenes NOT DETECTED NOT DETECTED Final   A.calcoaceticus-baumannii NOT DETECTED NOT DETECTED Final   Bacteroides fragilis NOT DETECTED NOT DETECTED Final   Enterobacterales NOT DETECTED NOT DETECTED Final   Enterobacter cloacae complex NOT DETECTED NOT DETECTED Final   Escherichia coli NOT DETECTED NOT DETECTED Final   Klebsiella aerogenes NOT DETECTED NOT DETECTED Final   Klebsiella oxytoca NOT DETECTED NOT DETECTED Final   Klebsiella pneumoniae NOT DETECTED NOT DETECTED Final   Proteus species NOT DETECTED NOT DETECTED Final   Salmonella species NOT DETECTED NOT DETECTED Final   Serratia marcescens NOT DETECTED NOT DETECTED Final   Haemophilus influenzae NOT DETECTED NOT DETECTED Final   Neisseria meningitidis NOT DETECTED NOT DETECTED Final   Pseudomonas aeruginosa DETECTED (A) NOT DETECTED Final    Comment: CRITICAL RESULT CALLED TO, READ BACK BY AND VERIFIED WITH: S BROWN,RN'@0449'$  02/08/21 [place]    Stenotrophomonas maltophilia NOT DETECTED NOT DETECTED Final   Candida albicans NOT DETECTED NOT DETECTED Final   Candida auris NOT DETECTED NOT DETECTED Final   Candida glabrata NOT DETECTED NOT DETECTED Final   Candida krusei NOT DETECTED NOT DETECTED Final   Candida parapsilosis NOT DETECTED NOT DETECTED Final   Candida tropicalis NOT DETECTED NOT DETECTED Final   Cryptococcus neoformans/gattii NOT DETECTED NOT DETECTED Final   CTX-M ESBL NOT DETECTED NOT DETECTED Final   Carbapenem resistance IMP NOT DETECTED NOT DETECTED Final   Carbapenem resistance KPC NOT DETECTED NOT  DETECTED Final   Carbapenem resistance NDM NOT DETECTED NOT DETECTED Final   Carbapenem resistance VIM NOT DETECTED NOT DETECTED Final    Comment: Performed at [facility], [address], [place], [place] [address]  Culture, blood (routine x 2)     Status: None (Preliminary result)   Collection Time: 02/11/21 10:02 AM   Specimen: Right Antecubital; Blood  Result Value Ref Range Status   Specimen Description RIGHT ANTECUBITAL  Final   Special Requests   Final    BOTTLES DRAWN AEROBIC AND ANAEROBIC Blood Culture results may not be optimal due to an excessive volume of blood received in culture bottles Performed at [facility], [address], [place], [place] [address]    Culture PENDING  Incomplete   Report Status PENDING  Incomplete  Culture, blood (routine x 2)     Status: None (Preliminary result)   Collection Time: 02/11/21 10:02 AM   Specimen: BLOOD RIGHT ARM  Result Value Ref Range Status   Specimen Description BLOOD RIGHT ARM  Final   Special Requests   Final    BOTTLES DRAWN AEROBIC AND ANAEROBIC Blood Culture adequate volume Performed at [facility], [address], [place],  [place] [address]    Culture PENDING  Incomplete   Report Status PENDING  Incomplete         Radiology Studies: No results found.      Scheduled Meds:  acetaminophen  650 mg Oral Q6H   Or   acetaminophen  650 mg Rectal Q6H   Chlorhexidine Gluconate Cloth  6 each Topical Daily   finasteride  5 mg Oral Daily   fluticasone  1 spray Each Nare Daily   folic acid  1 mg Oral Daily   furosemide  40 mg Oral Daily   gabapentin  100 mg Oral BID   nystatin   Topical BID   rifampin  300 mg Oral Q12H   saccharomyces boulardii  250 mg Oral BID   sodium chloride flush  3 mL Intravenous Q8H   tamsulosin  0.4 mg Oral QHS   topiramate  100 mg Oral Daily   Continuous Infusions:  ceFEPime (MAXIPIME) IV 2 g (02/11/21 0830)     LOS: 12 days    Time spent: 39 minutes spent on chart review,  discussion with nursing staff, consultants, updating family and interview/physical exam; more than 50% of that time was spent in counseling and/or coordination of care.    Briel Gallicchio J [place] DO [facility] Available via Epic secure chat 7am-7pm After these hours, please refer to coverage provider listed on [email or web address] 02/11/2021, 2:51 PM

## 2021-02-12 DIAGNOSIS — L03313 Cellulitis of chest wall: Secondary | ICD-10-CM | POA: Diagnosis not present

## 2021-02-12 LAB — BASIC METABOLIC PANEL
Anion gap: 6 (ref 5–15)
BUN: 35 mg/dL — ABNORMAL HIGH (ref 8–23)
CO2: 25 mmol/L (ref 22–32)
Calcium: 7.7 mg/dL — ABNORMAL LOW (ref 8.9–10.3)
Chloride: 105 mmol/L (ref 98–111)
Creatinine, Ser: 2.6 mg/dL — ABNORMAL HIGH (ref 0.61–1.24)
GFR, Estimated: 27 mL/min — ABNORMAL LOW (ref >60)
Glucose, Bld: 133 mg/dL — ABNORMAL HIGH (ref 70–99)
Potassium: 3.7 mmol/L (ref 3.5–5.1)
Sodium: 136 mmol/L (ref 135–145)

## 2021-02-12 MED ORDER — FUROSEMIDE 10 MG/ML IJ SOLN
40.0000 mg | Freq: Every day | INTRAMUSCULAR | Status: DC
  Administered 2021-02-12: 40 mg via INTRAVENOUS
  Filled 2021-02-12 (×2): qty 4

## 2021-02-12 NOTE — Plan of Care (Signed)
  Problem: Education: Goal: Knowledge of General Education information will improve Description: Including pain rating scale, medication(s)/side effects and non-pharmacologic comfort measures 02/12/2021 0515 by Cyndra Numbers, RN Outcome: Progressing 02/12/2021 0514 by Cyndra Numbers, RN Outcome: Progressing   Problem: Health Behavior/Discharge Planning: Goal: Ability to manage health-related needs will improve 02/12/2021 0515 by Cyndra Numbers, RN Outcome: Progressing 02/12/2021 0514 by Cyndra Numbers, RN Outcome: Progressing   Problem: Clinical Measurements: Goal: Ability to maintain clinical measurements within normal limits will improve 02/12/2021 0515 by Cyndra Numbers, RN Outcome: Progressing 02/12/2021 0514 by Cyndra Numbers, RN Outcome: Progressing Goal: Will remain free from infection 02/12/2021 0515 by Cyndra Numbers, RN Outcome: Progressing 02/12/2021 0514 by Cyndra Numbers, RN Outcome: Progressing Goal: Diagnostic test results will improve 02/12/2021 0515 by Cyndra Numbers, RN Outcome: Progressing 02/12/2021 0514 by Cyndra Numbers, RN Outcome: Progressing Goal: Respiratory complications will improve 02/12/2021 0515 by Cyndra Numbers, RN Outcome: Progressing 02/12/2021 0514 by Cyndra Numbers, RN Outcome: Progressing Goal: Cardiovascular complication will be avoided 02/12/2021 0515 by Cyndra Numbers, RN Outcome: Progressing 02/12/2021 0514 by Cyndra Numbers, RN Outcome: Progressing   Problem: Activity: Goal: Risk for activity intolerance will decrease 02/12/2021 0515 by Cyndra Numbers, RN Outcome: Progressing 02/12/2021 0514 by Cyndra Numbers, RN Outcome: Progressing   Problem: Nutrition: Goal: Adequate nutrition will be maintained 02/12/2021 0515 by Cyndra Numbers, RN Outcome: Progressing 02/12/2021 0514 by Cyndra Numbers, RN Outcome: Progressing   Problem: Coping: Goal: Level of anxiety will decrease 02/12/2021 0515 by Cyndra Numbers, RN Outcome: Progressing 02/12/2021 0514 by Cyndra Numbers, RN Outcome: Progressing   Problem:  Elimination: Goal: Will not experience complications related to bowel motility 02/12/2021 0515 by Cyndra Numbers, RN Outcome: Progressing 02/12/2021 0514 by Cyndra Numbers, RN Outcome: Progressing Goal: Will not experience complications related to urinary retention 02/12/2021 0515 by Cyndra Numbers, RN Outcome: Progressing 02/12/2021 0514 by Cyndra Numbers, RN Outcome: Progressing   Problem: Pain Managment: Goal: General experience of comfort will improve 02/12/2021 0515 by Cyndra Numbers, RN Outcome: Progressing 02/12/2021 0514 by Cyndra Numbers, RN Outcome: Progressing   Problem: Safety: Goal: Ability to remain free from injury will improve 02/12/2021 0515 by Cyndra Numbers, RN Outcome: Progressing 02/12/2021 0514 by Cyndra Numbers, RN Outcome: Progressing   Problem: Skin Integrity: Goal: Risk for impaired skin integrity will decrease 02/12/2021 0515 by Cyndra Numbers, RN Outcome: Progressing 02/12/2021 0514 by Cyndra Numbers, RN Outcome: Progressing

## 2021-02-12 NOTE — Plan of Care (Signed)

## 2021-02-12 NOTE — Progress Notes (Signed)
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  In the emergency department, patient was notably tachycardic, febrile with a temperature of 101.0 F, BP 118/39, SPO2 97% on 2 L nasal cannula.  Lab work-up notable for leukocytosis, normocytic anemia, creatinine 2.63, influenza A/B and COVID-19 PCR negative.  Chest x-ray with cardiomegaly with findings suggestive of CHF/pulmonary edema. CT chest without contrast with marked severity cellulitis along the lateral aspect of the mid and lower chest wall, marked severity splenomegaly.  Patient was started on IV Azactam, Tylenol given due to fever and started on IV fluid hydration. TRH consulted for further evaluation and management of sepsis.    Assessment & Plan:   Principal Problem:   Cellulitis of chest wall Active Problems:   Obstructive sleep apnea   Chronic low back pain   Morbid obesity with BMI of 60.0-69.9, adult (HCC)   Multiple falls   Elevated brain natriuretic peptide (BNP) level   Pulmonary edema   Splenomegaly, not elsewhere classified   Ascites   Chronic venous stasis dermatitis of both lower extremities   Chronic kidney disease   Acute febrile illness   Acute respiratory failure with hypoxia (HCC)   Cellulitis of scrotum   Bacteremia due to Pseudomonas   Acute dyspnea   Pseudomonas septicemia Patient during hospitalization continued with intermittent fevers.  Currently had PICC line in place for outpatient antibiotics for  recent right prosthetic knee infection.  Source of infection likely PICC line.  PICC line was removed on 02/10/2021. --Infectious disease following, appreciate assistance --Continue cefepime 2g IV q12h, started 02/08/2021 --Repeat blood cultures x2 10/5: pending --Continue to monitor fever curve  Right knee prostatic joint infection Patient developed MSSE infection of right knee prosthesis, evaluated at [facility].  Discharged on long-term antibiotics for 6 weeks to end 02/25/2021 via PICC line with vancomycin and rifampin. --started on daptomycin at [facility], now discontinued by ID --Continue rifampin 300 mg p.o. twice daily --Outpatient follow-up with [facility]  Acute renal failure on CKD stage IV Baseline creatinine 1.8-2.5.  Received IV fluid hydration. --Cr 2.63>>3.08>2.57>2.79>2.86>2.78 --Avoid nephrotoxins, renal dose all medications --BMP in a.m.  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  Appears stable and at baseline. --Furosemide '40mg'$  IV q24h  Chronic venous stasis dermatitis bilateral lower extremities, chronic --Supportive care --Lower extremity elevation --Furosemide as above  Essential hypertension --Furosemide 40 mg IV q24h  BPH: Finasteride 5 mg p.o. daily, tamsulosin 0.4 mg nightly  Chest wall cellulitis: Resolved Scrotal cellulitis: Resolved  Morbid obesity Body mass index is 65.47 kg/m.  Discussed with patient needs for aggressive lifestyle changes/weight loss as this complicates all facets of care.  Outpatient follow-up with PCP.  May benefit from bariatric evaluation outpatient.  Weakness/deconditioning/debility: From [facility].  Seen by PT/OT recommending return to SNF. --Pending insurance authorization for [place], [place]    DVT prophylaxis: Place and maintain sequential compression device Start: 02/03/21  1019 SCDs  Start: 01/24/2021 2256   Code Status: Full Code Family Communication: No family present at bedside this morning  Disposition Plan:  Level of care: Med-Surg Status is: Inpatient  Remains inpatient appropriate because:Ongoing diagnostic testing needed not appropriate for outpatient work up, Unsafe d/c plan, IV treatments appropriate due to intensity of illness or inability to take PO, and Inpatient level of care appropriate due to severity of illness  Dispo: The patient is from: SNF              Anticipated d/c is to: SNF              Patient currently is not medically stable to d/c.   Difficult to place patient No   Consultants:  Infectious disease, Dr. [place] Flattery  Procedures:  PICC line removal 10/4  Antimicrobials:  Cefepime 9/23 - 9/26, 10/2>>  Rifampin 9/26>> Daptomycin 9/27 - 10/5 Aztreonam 9/20 3-01/2022 Vancomycin 9/23 Linezolid 9/23-9/26 Azithromycin 9/26 - 9/30 Zosyn 10/2 - 10/2  Anti-infectives (From admission, onward)    Start     Dose/Rate Route Frequency Ordered Stop   02/11/21 2200  rifampin (RIFADIN) capsule 300 mg        300 mg Oral Every 12 hours 02/11/21 0925     02/08/21 2000  ceFEPIme (MAXIPIME) 2 g in sodium chloride 0.9 % 100 mL IVPB        2 g 200 mL/hr over 30 Minutes Intravenous Every 12 hours 02/08/21 1302     02/08/21 1200  piperacillin-tazobactam (ZOSYN) IVPB 3.375 g  Status:  Discontinued        3.375 g 12.5 mL/hr over 240 Minutes Intravenous Every 8 hours 02/08/21 0558 02/08/21 1302   02/08/21 0600  piperacillin-tazobactam (ZOSYN) IVPB 3.375 g        3.375 g 100 mL/hr over 30 Minutes Intravenous STAT 02/08/21 0558 02/08/21 0716   02/03/21 2000  DAPTOmycin (CUBICIN) 900 mg in sodium chloride 0.9 % IVPB  Status:  Discontinued        900 mg 136 mL/hr over 30 Minutes Intravenous Daily 02/03/21 1516 02/11/21 0925   02/02/21 1410  vancomycin variable dose per unstable renal function (pharmacist dosing)  Status:  Discontinued         Does  not apply See admin instructions 02/02/21 1410 02/03/21 1516   02/02/21 1400  rifampin (RIFADIN) capsule 300 mg  Status:  Discontinued        300 mg Oral 3 times daily 02/02/21 1106 02/11/21 0925   02/02/21 1330  amoxicillin-clavulanate (AUGMENTIN) 875-125 MG per tablet 1 tablet        1 tablet Oral Every 12 hours 02/02/21 1238 02/06/21 2026   02/02/21 1000  rifampin (RIFADIN) capsule 450 mg  Status:  Discontinued        450 mg Oral Every 12 hours 02/02/21 0701 02/02/21 1106   02/02/21 0830  vancomycin (VANCOREADY) IVPB 2000 mg/400 mL        2,000 mg 200 mL/hr over 120 Minutes Intravenous  Once 02/02/21 0803 02/02/21 1547   01/31/21 0330  ceFEPIme (MAXIPIME) 2 g in sodium chloride 0.9 % 100 mL IVPB  Status:  Discontinued        2 g 200 mL/hr over 30 Minutes Intravenous Every 12 hours 01/31/2021 2214 02/02/21 0701   01/31/21 0030  linezolid (ZYVOX) tablet 600 mg  Status:  Discontinued        600 mg Oral Every 12 hours 01/31/2021 2344 02/02/21 0701   01/19/2021 2330  vancomycin (VANCOREADY) IVPB  1500 mg/300 mL  Status:  Discontinued       See Hyperspace for full Linked Orders Report.   1,500 mg 150 mL/hr over 120 Minutes Intravenous  Once 01/13/2021 2148 01/08/2021 2151   02/01/2021 2230  vancomycin (VANCOCIN) IVPB 1000 mg/200 mL premix  Status:  Discontinued       See Hyperspace for full Linked Orders Report.   1,000 mg 200 mL/hr over 60 Minutes Intravenous  Once 01/26/2021 2148 02/04/2021 2151   01/13/2021 2145  vancomycin (VANCOCIN) IVPB 1000 mg/200 mL premix  Status:  Discontinued        1,000 mg 200 mL/hr over 60 Minutes Intravenous  Once 01/16/2021 2140 01/28/2021 2145   01/24/2021 2145  ceFEPIme (MAXIPIME) 2 g in sodium chloride 0.9 % 100 mL IVPB  Status:  Discontinued        2 g 200 mL/hr over 30 Minutes Intravenous  Once 01/11/2021 2140 01/26/2021 2212   01/13/2021 1830  aztreonam (AZACTAM) 2 g in sodium chloride 0.9 % 100 mL IVPB        2 g 200 mL/hr over 30 Minutes Intravenous  Once 01/24/2021 1821 02/06/2021  2034         Subjective: Patient seen examined at bedside, resting comfortably.  Complaining about his diffuse edema, weight gain.  States he has not been receiving his Lasix, review of EMR notes that he has been receiving 40 mg p.o. daily.  Discussed with him will change to IV for now to assist with further volume removal but need to be cautious with his renal function.  Repeat blood cultures drawn yesterday continue to be pending.  No other specific complaints this morning.  Denies headache, no current fever/chills/night sweats, no nausea/vomiting/diarrhea, no chest pain, palpitations, no shortness of breath, no abdominal pain.  No acute events overnight per nursing staff.  Objective: Vitals:   02/11/21 1325 02/11/21 2033 02/12/21 0500 02/12/21 0534  BP: (!) 127/59 (!) 122/55  (!) 120/54  Pulse: 87 87  85  Resp: '20 17  19  '$ Temp: 98.9 F (37.2 C) 98.5 F (36.9 C)  97.7 F (36.5 C)  TempSrc: Oral Oral  Oral  SpO2: 93% 92%  91%  Weight:   (!) 184 kg   Height:        Intake/Output Summary (Last 24 hours) at 02/12/2021 0902 Last data filed at 02/11/2021 1811 Gross per 24 hour  Intake 480 ml  Output 650 ml  Net -170 ml   Filed Weights   02/10/21 0500 02/11/21 0512 02/12/21 0500  Weight: (!) 182.2 kg (!) 184.3 kg (!) 184 kg    Examination:  General exam: Appears calm and comfortable, morbidly obese Respiratory system: Clear to auscultation. Respiratory effort normal.  On room air Cardiovascular system: S1 & S2 heard, RRR. No JVD, murmurs, rubs, gallops or clicks. No pedal edema. Gastrointestinal system: Abdomen is nondistended, abdomen protuberant, soft and nontender. No organomegaly or masses felt. Normal bowel sounds heard.  Diffuse anasarca. GU: Lacey erythema to groin and intertriginous areas Central nervous system: Alert and oriented. No focal neurological deficits. Extremities: Symmetric 5 x 5 power. Skin: Bilateral lower extremities with chronic venous changes, no  purulence or concerning erythema, chronic edema Psychiatry: Judgement and insight appear poor. Mood & affect appropriate.     Data Reviewed: I have personally reviewed following labs and imaging studies  CBC: Recent Labs  Lab 02/06/21 1227 02/07/21 0515 02/09/21 0512 02/10/21 0534 02/11/21 0523  WBC 9.3 10.0 8.4 5.8 5.7  NEUTROABS  7.9* 8.0* 5.6  --   --   HGB 10.3* 8.3* 8.2* 8.3* 8.5*  HCT 33.5* 27.1* 26.8* 27.4* 27.5*  MCV 92.0 93.4 93.7 93.5 94.2  PLT 110* 73* 78* 88* 95*   Basic Metabolic Panel: Recent Labs  Lab 02/07/21 0515 02/08/21 0421 02/09/21 0512 02/10/21 0534 02/11/21 0523  NA 134* 133* 133* 134* 137  K 3.8 3.4* 3.9 4.0 3.9  CL 103 101 103 103 104  CO2 '25 25 25 25 25  '$ GLUCOSE 108* 142* 124* 132* 114*  BUN 30* 32* 35* 36* 36*  CREATININE 2.57* 2.77* 2.79* 2.86* 2.78*  CALCIUM 7.5* 7.6* 7.8* 7.6* 7.7*  MG  --   --  1.8 1.9 1.8   GFR: Estimated Creatinine Clearance: 42.5 mL/min (A) (by C-G formula based on SCr of 2.78 mg/dL (H)). Liver Function Tests: Recent Labs  Lab 02/07/21 0515 02/08/21 0421  AST 22 25  ALT 17 18  ALKPHOS 99 147*  BILITOT 1.5* 1.3*  PROT 6.3* 7.1  ALBUMIN 1.7* 2.0*   No results for input(s): LIPASE, AMYLASE in the last 168 hours. No results for input(s): AMMONIA in the last 168 hours. Coagulation Profile: No results for input(s): INR, PROTIME in the last 168 hours. Cardiac Enzymes: Recent Labs  Lab 02/10/21 0534  CKTOTAL 17*   BNP (last 3 results) No results for input(s): PROBNP in the last 8760 hours. HbA1C: No results for input(s): HGBA1C in the last 72 hours. CBG: Recent Labs  Lab 02/08/21 0834  GLUCAP 129*   Lipid Profile: No results for input(s): CHOL, HDL, LDLCALC, TRIG, CHOLHDL, LDLDIRECT in the last 72 hours. Thyroid Function Tests: No results for input(s): TSH, T4TOTAL, FREET4, T3FREE, THYROIDAB in the last 72 hours. Anemia Panel: No results for input(s): VITAMINB12, FOLATE, FERRITIN, TIBC, IRON,  RETICCTPCT in the last 72 hours. Sepsis Labs: Recent Labs  Lab 02/06/21 1756 02/07/21 0515 02/08/21 0421  PROCALCITON 5.28 7.39 6.62    Recent Results (from the past 240 hour(s))  Culture, blood (routine x 2)     Status: Abnormal   Collection Time: 02/06/21 12:27 PM   Specimen: Right Antecubital; Blood  Result Value Ref Range Status   Specimen Description   Final    RIGHT ANTECUBITAL BOTTLES DRAWN AEROBIC AND ANAEROBIC Performed at [facility], [address] [place], [place] [address]    Special Requests   Final    Blood Culture adequate volume Performed at [facility], [address], [place], [place] [address]    Culture  Setup Time   Final    AEROBIC BOTTLE GRAM NEGATIVE RODS CRITICAL RESULT CALLED TO, READ BACK BY AND VERIFIED WITH: BROWN,S 2329 02/07/2021 COLEMAN,R CRITICAL RESULT CALLED TO, READ BACK BY AND VERIFIED WITH: S BROWN,RN'@0448'$  02/08/21 [place] Performed at [facility], [place] [address], [place], [place] [address]    Culture PSEUDOMONAS AERUGINOSA (A)  Final   Report Status 02/10/2021 FINAL  Final   Organism ID, Bacteria PSEUDOMONAS AERUGINOSA  Final      Susceptibility   Pseudomonas aeruginosa - MIC*    CEFTAZIDIME 4 SENSITIVE Sensitive     CIPROFLOXACIN <=0.25 SENSITIVE Sensitive     GENTAMICIN <=1 SENSITIVE Sensitive     IMIPENEM 1 SENSITIVE Sensitive     CEFEPIME 2 SENSITIVE Sensitive     * PSEUDOMONAS AERUGINOSA  Culture, blood (routine x 2)     Status: None   Collection Time: 02/06/21 12:27 PM   Specimen: BLOOD RIGHT HAND  Result Value Ref Range  Status   Specimen Description BLOOD RIGHT HAND BOTTLES DRAWN AEROBIC ONLY  Final   Special Requests Blood Culture adequate volume  Final   Culture   Final    NO GROWTH 5 DAYS Performed at [facility], [address], [place], [place] [address]    Report Status 02/11/2021 FINAL  Final  Blood Culture ID Panel (Reflexed)     Status: Abnormal   Collection Time: 02/06/21 12:27 PM  Result Value Ref Range  Status   Enterococcus faecalis NOT DETECTED NOT DETECTED Final   Enterococcus Faecium NOT DETECTED NOT DETECTED Final   Listeria monocytogenes NOT DETECTED NOT DETECTED Final   Staphylococcus species NOT DETECTED NOT DETECTED Final   Staphylococcus aureus (BCID) NOT DETECTED NOT DETECTED Final   Staphylococcus epidermidis NOT DETECTED NOT DETECTED Final   Staphylococcus lugdunensis NOT DETECTED NOT DETECTED Final   Streptococcus species NOT DETECTED NOT DETECTED Final   Streptococcus agalactiae NOT DETECTED NOT DETECTED Final   Streptococcus pneumoniae NOT DETECTED NOT DETECTED Final   Streptococcus pyogenes NOT DETECTED NOT DETECTED Final   A.calcoaceticus-baumannii NOT DETECTED NOT DETECTED Final   Bacteroides fragilis NOT DETECTED NOT DETECTED Final   Enterobacterales NOT DETECTED NOT DETECTED Final   Enterobacter cloacae complex NOT DETECTED NOT DETECTED Final   Escherichia coli NOT DETECTED NOT DETECTED Final   Klebsiella aerogenes NOT DETECTED NOT DETECTED Final   Klebsiella oxytoca NOT DETECTED NOT DETECTED Final   Klebsiella pneumoniae NOT DETECTED NOT DETECTED Final   Proteus species NOT DETECTED NOT DETECTED Final   Salmonella species NOT DETECTED NOT DETECTED Final   Serratia marcescens NOT DETECTED NOT DETECTED Final   Haemophilus influenzae NOT DETECTED NOT DETECTED Final   Neisseria meningitidis NOT DETECTED NOT DETECTED Final   Pseudomonas aeruginosa DETECTED (A) NOT DETECTED Final    Comment: CRITICAL RESULT CALLED TO, READ BACK BY AND VERIFIED WITH: S BROWN,RN'@0449'$  02/08/21 Vivian    Stenotrophomonas maltophilia NOT DETECTED NOT DETECTED Final   Candida albicans NOT DETECTED NOT DETECTED Final   Candida auris NOT DETECTED NOT DETECTED Final   Candida glabrata NOT DETECTED NOT DETECTED Final   Candida krusei NOT DETECTED NOT DETECTED Final   Candida parapsilosis NOT DETECTED NOT DETECTED Final   Candida tropicalis NOT DETECTED NOT DETECTED Final   Cryptococcus  neoformans/gattii NOT DETECTED NOT DETECTED Final   CTX-M ESBL NOT DETECTED NOT DETECTED Final   Carbapenem resistance IMP NOT DETECTED NOT DETECTED Final   Carbapenem resistance KPC NOT DETECTED NOT DETECTED Final   Carbapenem resistance NDM NOT DETECTED NOT DETECTED Final   Carbapenem resistance VIM NOT DETECTED NOT DETECTED Final    Comment: Performed at [facility], [address] [place], [place] [address]  Culture, blood (routine x 2)     Status: None (Preliminary result)   Collection Time: 02/11/21 10:02 AM   Specimen: Right Antecubital; Blood  Result Value Ref Range Status   Specimen Description RIGHT ANTECUBITAL  Final   Special Requests   Final    BOTTLES DRAWN AEROBIC AND ANAEROBIC Blood Culture results may not be optimal due to an excessive volume of blood received in culture bottles   Culture   Final    NO GROWTH < 24 HOURS Performed at [facility], [address], [place], [place] [address]    Report Status PENDING  Incomplete  Culture, blood (routine x 2)     Status: None (Preliminary result)   Collection Time: 02/11/21 10:02 AM   Specimen: BLOOD RIGHT ARM  Result Value Ref Range Status   Specimen Description BLOOD RIGHT ARM  Final   Special Requests   Final    BOTTLES DRAWN AEROBIC AND ANAEROBIC Blood Culture adequate volume   Culture   Final    NO GROWTH < 24 HOURS Performed at [facility], [address] [place], [place] [address]    Report Status PENDING  Incomplete         Radiology Studies: No results found.      Scheduled Meds:  acetaminophen  650 mg Oral Q6H   Or   acetaminophen  650 mg Rectal Q6H   Chlorhexidine Gluconate Cloth  6 each Topical Daily   finasteride  5 mg Oral Daily   fluticasone  1 spray Each Nare Daily   folic acid  1 mg Oral Daily   furosemide  40 mg Intravenous Daily   gabapentin  100 mg Oral BID   nystatin   Topical BID   rifampin  300 mg Oral Q12H   saccharomyces boulardii  250 mg Oral BID   sodium chloride  flush  3 mL Intravenous Q8H   tamsulosin  0.4 mg Oral QHS   topiramate  100 mg Oral Daily   Continuous Infusions:  ceFEPime (MAXIPIME) IV 2 g (02/11/21 2202)     LOS: 13 days    Time spent: 39 minutes spent on chart review, discussion with nursing staff, consultants, updating family and interview/physical exam; more than 50% of that time was spent in counseling and/or coordination of care.    Temari Schooler J [place] DO [facility] Available via Epic secure chat 7am-7pm After these hours, please refer to coverage provider listed on [email or web address] 02/12/2021, 9:02 AM

## 2021-02-12 NOTE — Progress Notes (Signed)
Physical Therapy Treatment Patient Details Name: Eddie Fletcher MRN: [id number] DOB: Nov 12, 1955 Today's Date: 02/12/2021   History of Present Illness Eddie Fletcher is a 65 y.o. male with medical history significant for hypertension, chronic back pain, multiple falls, sleep apnea, morbid obesity, BPH who presents to the emergency department from [facility] via EMS due to 1 day onset of fever and was also noted to be somewhat more somnolent.  He is being actively treated with IV vancomycin for cellulitis of the scrotum and legs and the concern was his febrile episode as well as some hypoxemia.    PT Comments    Pt sitting up in bed eager and willing to participate with therapy today.  Pt required min assist from therapist to help pull up but able to use the bed rails for most of this.  Pt able to establish and maintain seated balance.  Pt initially unable to stand on first try but using momentum and min assist from therapist was able to stand with walker.  Ambulated 5 feet over to chair and able to position himself once there.  Pt verbalized fear of falling during ambulation but never appeared to be unsteady or at risk of falling during the 5 steps over to recliner.     Recommendations for follow up therapy are one component of a multi-disciplinary discharge planning process, led by the attending physician.  Recommendations may be updated based on patient status, additional functional criteria and insurance authorization.  Follow Up Recommendations  SNF     Equipment Recommendations  None recommended by PT    Recommendations for Other Services       Precautions / Restrictions Precautions Precautions: Fall     Mobility  Bed Mobility Overal bed mobility: Needs Assistance Bed Mobility: Supine to Sit     Supine to sit: Min assist;HOB elevated     General bed mobility comments: had difficulty sitting up at bedside requiring repeated attempts and use of bed rail with HOB rasied,  reports of mild dizziness for 2 min following initial sitting    Transfers Overall transfer level: Needs assistance Equipment used: Rolling walker (2 wheeled) Transfers: Sit to/from Stand Sit to Stand: Min guard;Min assist         General transfer comment: min/mod assist to power up with RW  Ambulation/Gait Ambulation/Gait assistance: Min assist Gait Distance (Feet): 5 Feet Assistive device: Rolling walker (2 wheeled) Gait Pattern/deviations: Shuffle;Step-to pattern;Decreased step length - left;Decreased step length - right;Wide base of support Gait velocity: decreased                            Pertinent Vitals/Pain Pain Assessment: No/denies pain       PT Goals (current goals can now be found in the care plan section) Acute Rehab PT Goals Patient Stated Goal: return home after rehab Potential to Achieve Goals: Good Progress towards PT goals: Progressing toward goals    Frequency    Min 3X/week      PT Plan Current plan remains appropriate    Co-evaluation              AM-PAC PT "6 Clicks" Mobility   Outcome Measure  Help needed turning from your back to your side while in a flat bed without using bedrails?: A Little Help needed moving from lying on your back to sitting on the side of a flat bed without using bedrails?: A Little Help needed moving to and from  a bed to a chair (including a wheelchair)?: A Lot Help needed standing up from a chair using your arms (e.g., wheelchair or bedside chair)?: A Little Help needed to walk in hospital room?: A Lot Help needed climbing 3-5 steps with a railing? : Total 6 Click Score: 14    End of Session Equipment Utilized During Treatment: Gait belt Activity Tolerance: Patient tolerated treatment well;Patient limited by fatigue Patient left: with call bell/phone within reach;in chair   PT Visit Diagnosis: Difficulty in walking, not elsewhere classified (R26.2);Muscle weakness (generalized)  (M62.81);Unsteadiness on feet (R26.81)     Time: 1445-1510 PT Time Calculation (min) (ACUTE ONLY): 25 min  Charges:  $Gait Training: 8-22 mins $Therapeutic Activity: 8-22 mins                      Teena Irani, PTA/CLT, WTA [place], [place] 02/12/2021, 3:45 PM

## 2021-02-13 ENCOUNTER — Inpatient Hospital Stay (HOSPITAL_COMMUNITY): Payer: Medicare HMO

## 2021-02-13 DIAGNOSIS — L03313 Cellulitis of chest wall: Secondary | ICD-10-CM | POA: Diagnosis not present

## 2021-02-13 LAB — BLOOD GAS, ARTERIAL
Acid-base deficit: 1.5 mmol/L (ref 0.0–2.0)
Bicarbonate: 22.2 mmol/L (ref 20.0–28.0)
FIO2: 21
O2 Saturation: 65.1 %
Patient temperature: 36.8
pCO2 arterial: 58.9 mmHg — ABNORMAL HIGH (ref 32.0–48.0)
pH, Arterial: 7.248 — ABNORMAL LOW (ref 7.350–7.450)
pO2, Arterial: 38.2 mmHg — CL (ref 83.0–108.0)

## 2021-02-13 LAB — BASIC METABOLIC PANEL
Anion gap: 6 (ref 5–15)
BUN: 38 mg/dL — ABNORMAL HIGH (ref 8–23)
CO2: 25 mmol/L (ref 22–32)
Calcium: 7.7 mg/dL — ABNORMAL LOW (ref 8.9–10.3)
Chloride: 108 mmol/L (ref 98–111)
Creatinine, Ser: 2.56 mg/dL — ABNORMAL HIGH (ref 0.61–1.24)
GFR, Estimated: 27 mL/min — ABNORMAL LOW (ref >60)
Glucose, Bld: 112 mg/dL — ABNORMAL HIGH (ref 70–99)
Potassium: 3.8 mmol/L (ref 3.5–5.1)
Sodium: 139 mmol/L (ref 135–145)

## 2021-02-13 LAB — CBC
HCT: 26.1 % — ABNORMAL LOW (ref 39.0–52.0)
Hemoglobin: 8 g/dL — ABNORMAL LOW (ref 13.0–17.0)
MCH: 28.8 pg (ref 26.0–34.0)
MCHC: 30.7 g/dL (ref 30.0–36.0)
MCV: 93.9 fL (ref 80.0–100.0)
Platelets: 96 10*3/uL — ABNORMAL LOW (ref 150–400)
RBC: 2.78 MIL/uL — ABNORMAL LOW (ref 4.22–5.81)
RDW: 17.4 % — ABNORMAL HIGH (ref 11.5–15.5)
WBC: 6 10*3/uL (ref 4.0–10.5)
nRBC: 0 % (ref 0.0–0.2)

## 2021-02-13 LAB — AMMONIA
Ammonia: 21 umol/L (ref 9–35)
Ammonia: 41 umol/L — ABNORMAL HIGH (ref 9–35)

## 2021-02-13 LAB — MAGNESIUM: Magnesium: 1.9 mg/dL (ref 1.7–2.4)

## 2021-02-13 LAB — D-DIMER, QUANTITATIVE: D-Dimer, Quant: 6.7 ug/mL-FEU — ABNORMAL HIGH (ref 0.00–0.50)

## 2021-02-13 MED ORDER — FUROSEMIDE 10 MG/ML IJ SOLN
40.0000 mg | Freq: Two times a day (BID) | INTRAMUSCULAR | Status: DC
  Administered 2021-02-13 – 2021-02-16 (×7): 40 mg via INTRAVENOUS
  Filled 2021-02-13 (×6): qty 4

## 2021-02-13 NOTE — Progress Notes (Signed)
Occupational Therapy Treatment Patient Details Name: Eddie Fletcher MRN: [id number] DOB: 17-Jul-1955 Today's Date: 02/13/2021   History of present illness Eddie Fletcher is a 65 y.o. male with medical history significant for hypertension, chronic back pain, multiple falls, sleep apnea, morbid obesity, BPH who presents to the emergency department from [facility] via EMS due to 1 day onset of fever and was also noted to be somewhat more somnolent.  He is being actively treated with IV vancomycin for cellulitis of the scrotum and legs and the concern was his febrile episode as well as some hypoxemia.   OT comments  Pt reported feeling off at start of session. Required assist to operate phone to call wife per pt's request. Pt repeatedly stating that he felt "off" and dizzy. Pt able to sit up in bed with min A but no improvement in status. Nursing was called into room. Pt reported feeling better and wanting to stand. This was briefly attempted and abandoned due to pt difficulty. Pt was agreeable to lie down in bed requiring mod A. Pt left in room with nursing staff with reports of possibly transferring to ICU. Pt will benefit from continued OT in the hospital and recommended venue below to increase strength, balance, and endurance for safe ADL's.      Recommendations for follow up therapy are one component of a multi-disciplinary discharge planning process, led by the attending physician.  Recommendations may be updated based on patient status, additional functional criteria and insurance authorization.    Follow Up Recommendations  SNF    Equipment Recommendations  None recommended by OT          Precautions / Restrictions Precautions Precautions: Fall Restrictions Weight Bearing Restrictions: No       Mobility Bed Mobility Overal bed mobility: Needs Assistance Bed Mobility: Supine to Sit     Supine to sit: Min assist;HOB elevated Sit to supine: Mod assist   General bed mobility  comments: Pt able to pull himself to head of bed when supine. Assist needed to bring legs to bed during sit to supine. Pt using railing and hand held assist for supine to sit.    Transfers                 General transfer comment: Sit to stand attempted with RW once but not completed due to pt fatigue and status of feeling "off."    Balance Overall balance assessment: Needs assistance Sitting-balance support: No upper extremity supported;Feet supported Sitting balance-Leahy Scale: Good Sitting balance - Comments: seated at EOB                                   ADL either performed or assessed with clinical judgement   ADL                       Lower Body Dressing: Total assistance;Bed level Lower Body Dressing Details (indicate cue type and reason): Total assist to dof and don socks at bed level.                                       Cognition Arousal/Alertness: Awake/alert Behavior During Therapy: Restless Overall Cognitive Status: Impaired/Different from baseline Area of Impairment: Safety/judgement;Attention (Pt was mildly confused, fidgeting and repeating himself.)  Safety/Judgement: Decreased awareness of deficits     General Comments: Pt was reporting that he felt "off" and was mildly agitated and fidgeting seeking to sit up.                   Pertinent Vitals/ Pain       Pain Assessment: No/denies pain                                                          Frequency  Min 2X/week        Progress Toward Goals  OT Goals(current goals can now be found in the care plan section)  Progress towards OT goals: Progressing toward goals  Acute Rehab OT Goals Patient Stated Goal: return home after rehab OT Goal Formulation: With patient Time For Goal Achievement: 02/16/21 Potential to Achieve Goals: Fair ADL Goals Pt Will Perform Grooming: with  set-up;sitting;with adaptive equipment Pt Will Perform Lower Body Dressing: with min assist;with mod assist;sitting/lateral leans;with adaptive equipment Pt Will Transfer to Toilet: with mod assist;stand pivot transfer;with max assist;bedside commode Pt/caregiver will Perform Home Exercise Program: Increased strength;Both right and left upper extremity;With Supervision  Plan Discharge plan remains appropriate                                    End of Session Equipment Utilized During Treatment: Rolling walker  OT Visit Diagnosis: Unsteadiness on feet (R26.81);Muscle weakness (generalized) (M62.81)   Activity Tolerance Treatment limited secondary to medical complications (Comment)   Patient Left in bed;with call bell/phone within reach;with nursing/sitter in room   Nurse Communication Other (comment) (Nurse was called into session due to pt's slightly altered mental status.)        Time: [id number] OT Time Calculation (min): 26 min  Charges: OT General Charges $OT Visit: 1 Visit OT Treatments $Self Care/Home Management : 8-22 mins  Eddie Fletcher OT, MOT  Eddie Fletcher 02/13/2021, 12:19 PM

## 2021-02-13 NOTE — Progress Notes (Addendum)
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  In the emergency department, patient was notably tachycardic, febrile with a temperature of 101.0 F, BP 118/39, SPO2 97% on 2 L nasal cannula.  Lab work-up notable for leukocytosis, normocytic anemia, creatinine 2.63, influenza A/B and COVID-19 PCR negative.  Chest x-ray with cardiomegaly with findings suggestive of CHF/pulmonary edema. CT chest without contrast with marked severity cellulitis along the lateral aspect of the mid and lower chest wall, marked severity splenomegaly.  Patient was started on IV Azactam, Tylenol given due to fever and started on IV fluid hydration. TRH consulted for further evaluation and management of sepsis.    Assessment & Plan:   Principal Problem:   Cellulitis of chest wall Active Problems:   Obstructive sleep apnea   Chronic low back pain   Morbid obesity with BMI of 60.0-69.9, adult (HCC)   Multiple falls   Elevated brain natriuretic peptide (BNP) level   Pulmonary edema   Splenomegaly, not elsewhere classified   Ascites   Chronic venous stasis dermatitis of both lower extremities   Chronic kidney disease   Acute febrile illness   Acute respiratory failure with hypoxia (HCC)   Cellulitis of scrotum   Bacteremia due to Pseudomonas   Acute dyspnea   Acute metabolic encephalopathy Hypoxemia, hypercapnia Elevated A-a gradient This morning, patient reports feeling off, confused.  Significant change from  his baseline yesterday.  Having difficulty with grabbing objects.  Denies home CPAP use.  Ammonia level 21, within normal limits.  Patient is afebrile.  ABG this morning with pH 7.24/PaCO2 58.9/PaO2 38.2.  His peripheral PaO2 is 96% on room air. A-a gradient = 37.9 (expected for age 67.2); elevated.  Patient had NM VQ scan on 02/08/2021 with no perfusion defects.  Also had ultrasound bilateral lower extremities that were negative for DVT, although difficult study due to body habitus. --Transferred to ICU for need for BiPAP given significant hypoxemia and hypercapnia --Check D-dimer, if elevated will likely repeat nuclear medicine VQ scan, lower extremity Dopplers --CXR --Repeat ABG in a.m. --Supportive care  Pseudomonas septicemia Patient during hospitalization continued with intermittent fevers.  Currently had PICC line in place for outpatient antibiotics for recent right prosthetic knee infection.  Source of infection likely PICC line.  PICC line was removed on 02/10/2021. --Infectious disease following, appreciate assistance --Continue cefepime 2g IV q12h, started 02/08/2021 --Repeat blood cultures x2 10/5: No growth x2 days --Continue to monitor fever curve  Right knee prostatic joint infection Patient developed MSSE infection of right knee prosthesis, evaluated at [facility].  Discharged on long-term antibiotics for 6 weeks to end 02/25/2021 via PICC line with vancomycin and rifampin. --started on daptomycin at [facility], now discontinued by ID --Continue rifampin 300 mg p.o. twice daily --Outpatient follow-up with [facility]  Acute renal failure on CKD stage IV Baseline creatinine 1.8-2.5.  Received IV fluid hydration. --Cr 2.63>>3.08>2.57>2.79>2.86>2.78>2.58 --Avoid nephrotoxins, renal dose all medications --BMP in a.m.  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  Appears stable and at baseline. --Furosemide '40mg'$  IV q12h -- Strict I's and O's and daily weights  Chronic venous stasis dermatitis bilateral lower extremities, chronic --Supportive care --Lower extremity elevation --Furosemide as above  Essential hypertension --Furosemide 40 mg IV q12h  BPH: Finasteride 5 mg p.o. daily, tamsulosin 0.4 mg nightly  Chest wall cellulitis: Resolved Scrotal cellulitis: Resolved  Morbid obesity Body mass index is 64.62 kg/m.  Discussed with patient needs for aggressive lifestyle changes/weight loss as this complicates all facets of care.  Outpatient follow-up with PCP.  May benefit from bariatric evaluation outpatient.  Weakness/deconditioning/debility: From [facility].  Seen by PT/OT recommending return to SNF. --Pending insurance authorization for [place], [place]    DVT prophylaxis: Place and maintain sequential compression device Start: 02/03/21 1019 SCDs Start: 01/11/2021 2256   Code Status: Full Code Family Communication: No family present at bedside this morning  Disposition Plan:  Level of care: ICU Status is: Inpatient  Remains inpatient appropriate because:Ongoing diagnostic testing needed not appropriate for outpatient work up, Unsafe d/c plan, IV treatments appropriate due to intensity of illness or inability to take PO, and Inpatient level of care appropriate due to severity of illness  Dispo: The patient is from: SNF              Anticipated d/c is to: SNF              Patient currently is not medically stable to d/c.   Difficult to place patient No   Consultants:  Infectious disease, Dr. Baxter Flattery  Procedures:  PICC line removal 10/4 BiPAP 10/7>>  Antimicrobials:  Cefepime 9/23 - 9/26, 10/2>>  Rifampin 9/26>> Daptomycin 9/27 - 10/5 Aztreonam 9/20 3-01/2022 Vancomycin 9/23 Linezolid 9/23-9/26 Azithromycin 9/26 - 9/30 Zosyn 10/2 - 10/2  Anti-infectives (From admission, onward)    Start     Dose/Rate  Route Frequency Ordered Stop   02/11/21 2200  rifampin (RIFADIN) capsule 300 mg        300 mg Oral Every 12 hours 02/11/21 0925     02/08/21 2000  ceFEPIme (MAXIPIME) 2 g in sodium chloride 0.9 % 100 mL IVPB        2 g 200 mL/hr over 30 Minutes Intravenous Every 12 hours 02/08/21 1302     02/08/21 1200  piperacillin-tazobactam (ZOSYN) IVPB 3.375 g  Status:  Discontinued        3.375 g 12.5 mL/hr over 240 Minutes Intravenous Every 8 hours 02/08/21 0558 02/08/21 1302   02/08/21 0600  piperacillin-tazobactam (ZOSYN) IVPB 3.375 g        3.375 g 100 mL/hr over 30 Minutes Intravenous STAT 02/08/21 0558 02/08/21 0716   02/03/21 2000  DAPTOmycin (CUBICIN) 900 mg in sodium chloride 0.9 % IVPB  Status:  Discontinued        900 mg 136 mL/hr over 30 Minutes Intravenous Daily 02/03/21 1516 02/11/21 0925   02/02/21 1410  vancomycin variable dose per unstable renal function (pharmacist dosing)  Status:  Discontinued         Does not apply See admin instructions 02/02/21 1410 02/03/21 1516   02/02/21 1400  rifampin (RIFADIN) capsule 300 mg  Status:  Discontinued        300 mg Oral 3 times daily 02/02/21 1106 02/11/21 0925   02/02/21 1330  amoxicillin-clavulanate (AUGMENTIN) 875-125 MG per tablet 1 tablet        1 tablet Oral Every 12 hours  02/02/21 1238 02/06/21 2026   02/02/21 1000  rifampin (RIFADIN) capsule 450 mg  Status:  Discontinued        450 mg Oral Every 12 hours 02/02/21 0701 02/02/21 1106   02/02/21 0830  vancomycin (VANCOREADY) IVPB 2000 mg/400 mL        2,000 mg 200 mL/hr over 120 Minutes Intravenous  Once 02/02/21 0803 02/02/21 1547   01/31/21 0330  ceFEPIme (MAXIPIME) 2 g in sodium chloride 0.9 % 100 mL IVPB  Status:  Discontinued        2 g 200 mL/hr over 30 Minutes Intravenous Every 12 hours 01/15/2021 2214 02/02/21 0701   01/31/21 0030  linezolid (ZYVOX) tablet 600 mg  Status:  Discontinued        600 mg Oral Every 12 hours 01/16/2021 2344 02/02/21 0701   01/28/2021 2330  vancomycin  (VANCOREADY) IVPB 1500 mg/300 mL  Status:  Discontinued       See Hyperspace for full Linked Orders Report.   1,500 mg 150 mL/hr over 120 Minutes Intravenous  Once 01/15/2021 2148 02/05/2021 2151   01/18/2021 2230  vancomycin (VANCOCIN) IVPB 1000 mg/200 mL premix  Status:  Discontinued       See Hyperspace for full Linked Orders Report.   1,000 mg 200 mL/hr over 60 Minutes Intravenous  Once 01/29/2021 2148 01/22/2021 2151   01/28/2021 2145  vancomycin (VANCOCIN) IVPB 1000 mg/200 mL premix  Status:  Discontinued        1,000 mg 200 mL/hr over 60 Minutes Intravenous  Once 02/02/2021 2140 01/09/2021 2145   01/10/2021 2145  ceFEPIme (MAXIPIME) 2 g in sodium chloride 0.9 % 100 mL IVPB  Status:  Discontinued        2 g 200 mL/hr over 30 Minutes Intravenous  Once 02/06/2021 2140 01/16/2021 2212   01/28/2021 1830  aztreonam (AZACTAM) 2 g in sodium chloride 0.9 % 100 mL IVPB        2 g 200 mL/hr over 30 Minutes Intravenous  Once 01/26/2021 1821 02/01/2021 2034         Subjective: Patient seen examined at bedside, resting comfortably.  Patient complaining of "woozy/fuzzy" feeling this morning.  Has lost 5 pounds of weight since yesterday after transitioning to IV Lasix.  Ammonia level this morning within normal limits, although ABG with acidosis, hypoxemia and hypercapnia.  Does not utilize CPAP nocturnally, but likely underlying OSA given his body habitus.  Transferring to ICU to initiate BiPAP today.  We will check D-dimer given his elevated A-a gradient.  Repeat blood cultures from 10/5 with no growth x2 days.  Continues on IV antibiotics and rifampin.  No other complaints or concerns at this time.  Denies fever/chills/night sweats, no nausea/vomiting/diarrhea, no chest pain, palpitations, no shortness of breath, no abdominal pain.  No acute events overnight per nursing staff.  Objective: Vitals:   02/12/21 1303 02/12/21 2012 02/13/21 0437 02/13/21 0552  BP: (!) 127/56 116/60  124/61  Pulse: 88 89  88  Resp: '20 19  16   '$ Temp: 98.5 F (36.9 C) 98 F (36.7 C)  98.2 F (36.8 C)  TempSrc: Oral Oral  Oral  SpO2: 95% 93%  92%  Weight:   (!) 181.6 kg   Height:        Intake/Output Summary (Last 24 hours) at 02/13/2021 1048 Last data filed at 02/12/2021 2021 Gross per 24 hour  Intake 400 ml  Output 1500 ml  Net -1100 ml   Filed Weights   02/11/21 0512  02/12/21 0500 02/13/21 0437  Weight: (!) 184.3 kg (!) 184 kg (!) 181.6 kg    Examination:  General exam: Appears calm and comfortable, morbidly obese, diffuse anasarca Respiratory system: Clear to auscultation. Respiratory effort normal.  On room air Cardiovascular system: S1 & S2 heard, RRR. No JVD, murmurs, rubs, gallops or clicks. No pedal edema. Gastrointestinal system: Abdomen is nondistended, abdomen protuberant, soft and nontender. No organomegaly or masses felt. Normal bowel sounds heard.  Diffuse anasarca. GU: Lacey erythema to groin and intertriginous areas Central nervous system: Alert. No focal neurological deficits. Extremities: Symmetric 5 x 5 power. Skin: Bilateral lower extremities with chronic venous changes, no purulence or concerning erythema, chronic edema Psychiatry: Judgement and insight appear poor. Mood & affect appropriate.     Data Reviewed: I have personally reviewed following labs and imaging studies  CBC: Recent Labs  Lab 02/06/21 1227 02/07/21 0515 02/09/21 0512 02/10/21 0534 02/11/21 0523 02/13/21 0518  WBC 9.3 10.0 8.4 5.8 5.7 6.0  NEUTROABS 7.9* 8.0* 5.6  --   --   --   HGB 10.3* 8.3* 8.2* 8.3* 8.5* 8.0*  HCT 33.5* 27.1* 26.8* 27.4* 27.5* 26.1*  MCV 92.0 93.4 93.7 93.5 94.2 93.9  PLT 110* 73* 78* 88* 95* 96*   Basic Metabolic Panel: Recent Labs  Lab 02/09/21 0512 02/10/21 0534 02/11/21 0523 02/12/21 0959 02/13/21 0518  NA 133* 134* 137 136 139  K 3.9 4.0 3.9 3.7 3.8  CL 103 103 104 105 108  CO2 '25 25 25 25 25  '$ GLUCOSE 124* 132* 114* 133* 112*  BUN 35* 36* 36* 35* 38*  CREATININE 2.79* 2.86*  2.78* 2.60* 2.56*  CALCIUM 7.8* 7.6* 7.7* 7.7* 7.7*  MG 1.8 1.9 1.8  --  1.9   GFR: Estimated Creatinine Clearance: 45.7 mL/min (A) (by C-G formula based on SCr of 2.56 mg/dL (H)). Liver Function Tests: Recent Labs  Lab 02/07/21 0515 02/08/21 0421  AST 22 25  ALT 17 18  ALKPHOS 99 147*  BILITOT 1.5* 1.3*  PROT 6.3* 7.1  ALBUMIN 1.7* 2.0*   No results for input(s): LIPASE, AMYLASE in the last 168 hours. Recent Labs  Lab 02/13/21 0846 02/13/21 0900  AMMONIA 41* 21   Coagulation Profile: No results for input(s): INR, PROTIME in the last 168 hours. Cardiac Enzymes: Recent Labs  Lab 02/10/21 0534  CKTOTAL 17*   BNP (last 3 results) No results for input(s): PROBNP in the last 8760 hours. HbA1C: No results for input(s): HGBA1C in the last 72 hours. CBG: Recent Labs  Lab 02/08/21 0834  GLUCAP 129*   Lipid Profile: No results for input(s): CHOL, HDL, LDLCALC, TRIG, CHOLHDL, LDLDIRECT in the last 72 hours. Thyroid Function Tests: No results for input(s): TSH, T4TOTAL, FREET4, T3FREE, THYROIDAB in the last 72 hours. Anemia Panel: No results for input(s): VITAMINB12, FOLATE, FERRITIN, TIBC, IRON, RETICCTPCT in the last 72 hours. Sepsis Labs: Recent Labs  Lab 02/06/21 1756 02/07/21 0515 02/08/21 0421  PROCALCITON 5.28 7.39 6.62    Recent Results (from the past 240 hour(s))  Culture, blood (routine x 2)     Status: Abnormal   Collection Time: 02/06/21 12:27 PM   Specimen: Right Antecubital; Blood  Result Value Ref Range Status   Specimen Description   Final    RIGHT ANTECUBITAL BOTTLES DRAWN AEROBIC AND ANAEROBIC Performed at [facility], [address] [place], [place] [address]    Special Requests   Final    Blood Culture adequate volume Performed at [facility], [address], [place], [place] [address]    Culture  Setup Time   Final    AEROBIC BOTTLE GRAM NEGATIVE RODS CRITICAL RESULT CALLED TO, READ BACK BY AND VERIFIED WITH: BROWN,S 2329 02/07/2021  COLEMAN,R CRITICAL RESULT CALLED TO, READ BACK BY AND VERIFIED WITH: S BROWN,RN'@0448'$  02/08/21 [place] Performed at [facility], [address], [place], [place] [address]    Culture PSEUDOMONAS AERUGINOSA (A)  Final   Report Status 02/10/2021 FINAL  Final   Organism ID, Bacteria PSEUDOMONAS AERUGINOSA  Final      Susceptibility   Pseudomonas aeruginosa - MIC*    CEFTAZIDIME 4 SENSITIVE Sensitive     CIPROFLOXACIN <=0.25 SENSITIVE Sensitive     GENTAMICIN <=1 SENSITIVE Sensitive     IMIPENEM 1 SENSITIVE Sensitive     CEFEPIME 2 SENSITIVE Sensitive     * PSEUDOMONAS AERUGINOSA  Culture, blood (routine x 2)     Status: None   Collection Time: 02/06/21 12:27 PM   Specimen: BLOOD RIGHT HAND  Result Value Ref Range Status   Specimen Description BLOOD RIGHT HAND BOTTLES DRAWN AEROBIC ONLY  Final   Special Requests Blood Culture adequate volume  Final   Culture   Final    NO GROWTH 5 DAYS Performed at [facility], [address], [place],  [address]    Report Status 02/11/2021 FINAL  Final  Blood Culture ID Panel (Reflexed)     Status: Abnormal   Collection Time: 02/06/21 12:27 PM  Result Value Ref Range Status   Enterococcus faecalis NOT DETECTED NOT DETECTED Final   Enterococcus Faecium NOT DETECTED NOT DETECTED Final   Listeria monocytogenes NOT DETECTED NOT DETECTED Final   Staphylococcus species NOT DETECTED NOT DETECTED Final   Staphylococcus aureus (BCID) NOT DETECTED NOT DETECTED Final   Staphylococcus epidermidis NOT DETECTED NOT DETECTED Final   Staphylococcus lugdunensis NOT DETECTED NOT DETECTED Final   Streptococcus species NOT DETECTED NOT DETECTED Final   Streptococcus agalactiae NOT DETECTED NOT DETECTED Final   Streptococcus pneumoniae NOT DETECTED NOT DETECTED Final   Streptococcus pyogenes NOT DETECTED NOT DETECTED Final   A.calcoaceticus-baumannii NOT DETECTED NOT DETECTED Final   Bacteroides fragilis NOT DETECTED NOT DETECTED Final   Enterobacterales  NOT DETECTED NOT DETECTED Final   Enterobacter cloacae complex NOT DETECTED NOT DETECTED Final   Escherichia coli NOT DETECTED NOT DETECTED Final   Klebsiella aerogenes NOT DETECTED NOT DETECTED Final   Klebsiella oxytoca NOT DETECTED NOT DETECTED Final   Klebsiella pneumoniae NOT DETECTED NOT DETECTED Final   Proteus species NOT DETECTED NOT DETECTED Final   Salmonella species NOT DETECTED NOT DETECTED Final   Serratia marcescens NOT DETECTED NOT DETECTED Final   Haemophilus influenzae NOT DETECTED NOT DETECTED Final   Neisseria meningitidis NOT DETECTED NOT DETECTED Final   Pseudomonas aeruginosa DETECTED (A) NOT DETECTED Final    Comment: CRITICAL RESULT CALLED TO, READ BACK BY AND VERIFIED WITH: S BROWN,RN'@0449'$  02/08/21 [place]    Stenotrophomonas maltophilia NOT DETECTED NOT DETECTED Final   Candida albicans NOT DETECTED NOT DETECTED Final   Candida auris NOT DETECTED NOT DETECTED Final   Candida glabrata NOT DETECTED NOT DETECTED Final   Candida krusei NOT DETECTED NOT DETECTED Final   Candida parapsilosis NOT DETECTED NOT DETECTED Final   Candida tropicalis NOT DETECTED NOT DETECTED Final   Cryptococcus neoformans/gattii NOT DETECTED NOT DETECTED Final   CTX-M ESBL NOT DETECTED NOT DETECTED Final   Carbapenem resistance IMP NOT DETECTED NOT DETECTED Final  Carbapenem resistance KPC NOT DETECTED NOT DETECTED Final   Carbapenem resistance NDM NOT DETECTED NOT DETECTED Final   Carbapenem resistance VIM NOT DETECTED NOT DETECTED Final    Comment: Performed at [facility], [place] [address], [place], [place] [address]  Culture, blood (routine x 2)     Status: None (Preliminary result)   Collection Time: 02/11/21 10:02 AM   Specimen: Right Antecubital; Blood  Result Value Ref Range Status   Specimen Description RIGHT ANTECUBITAL  Final   Special Requests   Final    BOTTLES DRAWN AEROBIC AND ANAEROBIC Blood Culture results may not be optimal due to an excessive volume of blood  received in culture bottles   Culture   Final    NO GROWTH 2 DAYS Performed at [facility], [address] [place], [place] [address]    Report Status PENDING  Incomplete  Culture, blood (routine x 2)     Status: None (Preliminary result)   Collection Time: 02/11/21 10:02 AM   Specimen: BLOOD RIGHT ARM  Result Value Ref Range Status   Specimen Description BLOOD RIGHT ARM  Final   Special Requests   Final    BOTTLES DRAWN AEROBIC AND ANAEROBIC Blood Culture adequate volume   Culture   Final    NO GROWTH 2 DAYS Performed at [facility], [address], [place], [place] [address]    Report Status PENDING  Incomplete  Blood culture (routine single)     Status: None (Preliminary result)   Collection Time: 02/12/21  9:59 AM   Specimen: Right Antecubital; Blood  Result Value Ref Range Status   Specimen Description RIGHT ANTECUBITAL  Final   Special Requests   Final    BOTTLES DRAWN AEROBIC AND ANAEROBIC Blood Culture adequate volume   Culture   Final    NO GROWTH < 24 HOURS Performed at [facility], [address], [place], [place] [address]    Report Status PENDING  Incomplete         Radiology Studies: No results found.      Scheduled Meds:  acetaminophen  650 mg Oral Q6H   Or   acetaminophen  650 mg Rectal Q6H   Chlorhexidine Gluconate Cloth  6 each Topical Daily   finasteride  5 mg Oral Daily   fluticasone  1 spray Each Nare Daily   folic acid  1 mg Oral Daily   furosemide  40 mg Intravenous Q12H   gabapentin  100 mg Oral BID   nystatin   Topical BID   rifampin  300 mg Oral Q12H   saccharomyces boulardii  250 mg Oral BID   sodium chloride flush  3 mL Intravenous Q8H   tamsulosin  0.4 mg Oral QHS   topiramate  100 mg Oral Daily   Continuous Infusions:  ceFEPime (MAXIPIME) IV 2 g (02/13/21 0912)     LOS: 14 days    Critical Care Time Upon my evaluation, this patient had a high probability of imminent or life-threatening deterioration due to hypercapnia,  hypoxemia, altered mental status, which required my direct attention, intervention, and personal management.  I have personally provided 49 minutes of critical care time exclusive of my time spent on separately billable procedures.  Time includes review of laboratory data, radiology results, discussion with consultants, and monitoring for potential decompensation.       Drevon Plog J [place] DO [facility] Available via Epic secure chat 7am-7pm After these hours, please refer to coverage provider listed on [email or web address]  02/13/2021, 10:48 AM

## 2021-02-13 NOTE — Progress Notes (Signed)
Patient transferred to ICU this afternoon for hypercapnia and hypoxia. Patient is alert/oriented x 2-3 but is very forgetful. He is awake and requesting to eat lunch. Explained to patient that he is unable to while on bipap due to aspiration risk. Patient verbalized understanding.

## 2021-02-13 NOTE — Progress Notes (Signed)
PT Cancellation Note  Patient Details Name: Eddie Fletcher MRN: [id number] DOB: Mar 07, 1956   Cancelled Treatment:    Reason Eval/Treat Not Completed: Medical issues which prohibited therapy.  Patient transferred to a higher level of care and will need new PT consult resume therapy when patient is medically stable.  Thank you.   1:55 PM, 02/13/21 Lonell Grandchild, MPT Physical Therapist with [facility] [phone] office [phone] mobile phone

## 2021-02-14 ENCOUNTER — Inpatient Hospital Stay (HOSPITAL_COMMUNITY): Payer: Medicare HMO

## 2021-02-14 DIAGNOSIS — R509 Fever, unspecified: Secondary | ICD-10-CM | POA: Diagnosis not present

## 2021-02-14 DIAGNOSIS — L03313 Cellulitis of chest wall: Secondary | ICD-10-CM | POA: Diagnosis not present

## 2021-02-14 LAB — CBC
HCT: 28.2 % — ABNORMAL LOW (ref 39.0–52.0)
Hemoglobin: 8.4 g/dL — ABNORMAL LOW (ref 13.0–17.0)
MCH: 28.5 pg (ref 26.0–34.0)
MCHC: 29.8 g/dL — ABNORMAL LOW (ref 30.0–36.0)
MCV: 95.6 fL (ref 80.0–100.0)
Platelets: 94 10*3/uL — ABNORMAL LOW (ref 150–400)
RBC: 2.95 MIL/uL — ABNORMAL LOW (ref 4.22–5.81)
RDW: 17.5 % — ABNORMAL HIGH (ref 11.5–15.5)
WBC: 5.5 10*3/uL (ref 4.0–10.5)
nRBC: 0 % (ref 0.0–0.2)

## 2021-02-14 LAB — COMPREHENSIVE METABOLIC PANEL
ALT: 15 U/L (ref 0–44)
AST: 20 U/L (ref 15–41)
Albumin: 1.8 g/dL — ABNORMAL LOW (ref 3.5–5.0)
Alkaline Phosphatase: 106 U/L (ref 38–126)
Anion gap: 6 (ref 5–15)
BUN: 39 mg/dL — ABNORMAL HIGH (ref 8–23)
CO2: 26 mmol/L (ref 22–32)
Calcium: 7.9 mg/dL — ABNORMAL LOW (ref 8.9–10.3)
Chloride: 107 mmol/L (ref 98–111)
Creatinine, Ser: 2.51 mg/dL — ABNORMAL HIGH (ref 0.61–1.24)
GFR, Estimated: 28 mL/min — ABNORMAL LOW (ref >60)
Glucose, Bld: 109 mg/dL — ABNORMAL HIGH (ref 70–99)
Potassium: 4 mmol/L (ref 3.5–5.1)
Sodium: 139 mmol/L (ref 135–145)
Total Bilirubin: 1 mg/dL (ref 0.3–1.2)
Total Protein: 7 g/dL (ref 6.5–8.1)

## 2021-02-14 LAB — BLOOD GAS, ARTERIAL
Acid-base deficit: 1 mmol/L (ref 0.0–2.0)
Acid-base deficit: 1.2 mmol/L (ref 0.0–2.0)
Bicarbonate: 22.4 mmol/L (ref 20.0–28.0)
Bicarbonate: 22.7 mmol/L (ref 20.0–28.0)
FIO2: 28
FIO2: 28
O2 Saturation: 97.3 %
O2 Saturation: 97.3 %
Patient temperature: 37
Patient temperature: 37
pCO2 arterial: 62.4 mmHg — ABNORMAL HIGH (ref 32.0–48.0)
pCO2 arterial: 58 mmHg — ABNORMAL HIGH (ref 32.0–48.0)
pH, Arterial: 7.236 — ABNORMAL LOW (ref 7.350–7.450)
pH, Arterial: 7.258 — ABNORMAL LOW (ref 7.350–7.450)
pO2, Arterial: 109 mmHg — ABNORMAL HIGH (ref 83.0–108.0)
pO2, Arterial: 106 mmHg (ref 83.0–108.0)

## 2021-02-14 LAB — MAGNESIUM: Magnesium: 1.9 mg/dL (ref 1.7–2.4)

## 2021-02-14 NOTE — Progress Notes (Signed)
Patient blood gas drawn for confusion by MD. Results almost identical to morning blood gas. Have increased rate to 15 on BiPAP , and pressure up to 16 IPAP .

## 2021-02-14 NOTE — Progress Notes (Addendum)
Patient having intermittent jerky movements , turning head to left , moaning un audible speech. Confusion ??   Took off BiPAP  did not place on oxygen saturation 92 to 95. Nurse notified.

## 2021-02-14 NOTE — Progress Notes (Signed)
Patient continues to have intermitted episodes of confusing, pulling at lines and leads. Unable to answer orientation questions consistently. A few bites at dinner time and then placed back on bipap due to AMS.

## 2021-02-14 NOTE — Progress Notes (Signed)
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   In the emergency department, patient was notably tachycardic, febrile with a temperature of 101.0 F, BP 118/39, SPO2 97% on 2 L nasal cannula.  Lab work-up notable for leukocytosis, normocytic anemia, creatinine 2.63, influenza A/B and COVID-19 PCR negative.  Chest x-ray with cardiomegaly with findings suggestive of CHF/pulmonary edema. CT chest without contrast with marked severity cellulitis along the lateral aspect of the mid and lower chest wall, marked severity splenomegaly.  Patient was started on IV Azactam, Tylenol given due to fever and started on IV fluid hydration. TRH consulted for further evaluation and management of sepsis.    Assessment & Plan:   Principal Problem:   Cellulitis of chest wall Active Problems:   Obstructive sleep apnea   Chronic low back pain   Morbid obesity with BMI of 60.0-69.9, adult (HCC)   Multiple falls   Elevated brain natriuretic peptide (BNP) level   Pulmonary edema   Splenomegaly, not elsewhere classified   Ascites   Chronic venous stasis dermatitis of both lower extremities   Chronic kidney disease   Acute febrile illness   Acute respiratory failure with hypoxia (HCC)   Cellulitis of scrotum   Bacteremia due to Pseudomonas   Acute dyspnea    Acute metabolic encephalopathy Hypoxemia, hypercapnia Recently noted to be more confused and encephalopathic on 10/8 Serum ammonia unremarkable Recent abg suggests  hypercarbia, pt is continued on bipap as tolerated This AM, pt is awake, conversant, somewhat confused -Cont with bipap as tolerated -Strongly recommend outpt sleep study if not done already   Pseudomonas septicemia Patient during hospitalization continued with intermittent fevers.   -Pt was initially continued with PICC line for outpatient antibiotics for recent right prosthetic knee infection. PICC line has since been removed on 02/10/2021 given concerns for line infection --Infectious disease following, appreciate assistance --Continued on cefepime 2g IV q12h, started 02/08/2021 --Repeat blood cultures x2 10/5: No growth x2 days --Cont course of abx for now   Right knee prostatic joint infection Patient developed MSSE infection of right knee prosthesis, evaluated at [place].  Discharged on long-term antibiotics for 6 weeks to end 02/25/2021 via PICC line with vancomycin and rifampin. --was initially continued on daptomycin at [facility], since later being discontinued by ID --Continue rifampin 300 mg p.o. 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  Received IV fluid hydration. 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  Appears stable and at baseline. --Furosemide '40mg'$  IV q12h -- Strict I's and O's and daily weights -Recent ammonia levels are stable   Chronic venous stasis dermatitis bilateral lower extremities, chronic --Supportive care --Lower extremity elevation --Furosemide as above   Essential hypertension --Furosemide 40 mg IV q12h   BPH: Finasteride 5 mg p.o. daily, tamsulosin 0.4 mg  nightly   Chest wall cellulitis: Resolved Scrotal  cellulitis: Resolved   Morbid obesity Body mass index is 64.62 kg/m.  Discussed with patient needs for aggressive lifestyle changes/weight loss as this complicates all facets of care.  Outpatient follow-up with PCP.  May benefit from bariatric evaluation outpatient. Recommend sleep study if not done already   Weakness/deconditioning/debility: From [facility].  Seen by PT/OT recommending return to SNF. --Pending insurance authorization for [employer], [place]   DVT prophylaxis: SCD's Code Status: Full Family Communication: Pt in room, family not at bedside  Status is: Inpatient  Remains inpatient appropriate because:Inpatient level of care appropriate due to severity of illness  Dispo: The patient is from: SNF              Anticipated d/c is to: SNF              Patient currently is not medically stable to d/c.   Difficult to place patient No       Consultants:  ID  Procedures:    Antimicrobials: Anti-infectives (From admission, onward)    Start     Dose/Rate Route Frequency Ordered Stop   02/11/21 2200  rifampin (RIFADIN) capsule 300 mg        300 mg Oral Every 12 hours 02/11/21 0925     02/08/21 2000  ceFEPIme (MAXIPIME) 2 g in sodium chloride 0.9 % 100 mL IVPB        2 g 200 mL/hr over 30 Minutes Intravenous Every 12 hours 02/08/21 1302     02/08/21 1200  piperacillin-tazobactam (ZOSYN) IVPB 3.375 g  Status:  Discontinued        3.375 g 12.5 mL/hr over 240 Minutes Intravenous Every 8 hours 02/08/21 0558 02/08/21 1302   02/08/21 0600  piperacillin-tazobactam (ZOSYN) IVPB 3.375 g        3.375 g 100 mL/hr over 30 Minutes Intravenous STAT 02/08/21 0558 02/08/21 0716   02/03/21 2000  DAPTOmycin (CUBICIN) 900 mg in sodium chloride 0.9 % IVPB  Status:  Discontinued        900 mg 136 mL/hr over 30 Minutes Intravenous Daily 02/03/21 1516 02/11/21 0925   02/02/21 1410  vancomycin variable dose per unstable renal function (pharmacist dosing)  Status:  Discontinued          Does not apply See admin instructions 02/02/21 1410 02/03/21 1516   02/02/21 1400  rifampin (RIFADIN) capsule 300 mg  Status:  Discontinued        300 mg Oral 3 times daily 02/02/21 1106 02/11/21 0925   02/02/21 1330  amoxicillin-clavulanate (AUGMENTIN) 875-125 MG per tablet 1 tablet        1 tablet Oral Every 12 hours 02/02/21 1238 02/06/21 2026   02/02/21 1000  rifampin (RIFADIN) capsule 450 mg  Status:  Discontinued        450 mg Oral Every 12 hours 02/02/21 0701 02/02/21 1106   02/02/21 0830  vancomycin (VANCOREADY) IVPB 2000 mg/400 mL        2,000 mg 200 mL/hr over 120 Minutes Intravenous  Once 02/02/21 0803 02/02/21 1547   01/31/21 0330  ceFEPIme (MAXIPIME) 2 g in sodium chloride 0.9 % 100 mL IVPB  Status:  Discontinued        2 g 200 mL/hr over 30 Minutes Intravenous Every 12 hours 01/23/2021 2214 02/02/21 0701   01/31/21 0030  linezolid (ZYVOX) tablet 600 mg  Status:  Discontinued        600 mg Oral Every 12  hours 01/23/2021 2344 02/02/21 0701   01/29/2021 2330  vancomycin (VANCOREADY) IVPB 1500 mg/300 mL  Status:  Discontinued       See Hyperspace for full Linked Orders Report.   1,500 mg 150 mL/hr over 120 Minutes Intravenous  Once 02/06/2021 2148 01/21/2021 2151   02/04/2021 2230  vancomycin (VANCOCIN) IVPB 1000 mg/200 mL premix  Status:  Discontinued       See Hyperspace for full Linked Orders Report.   1,000 mg 200 mL/hr over 60 Minutes Intravenous  Once 01/29/2021 2148 01/22/2021 2151   01/24/2021 2145  vancomycin (VANCOCIN) IVPB 1000 mg/200 mL premix  Status:  Discontinued        1,000 mg 200 mL/hr over 60 Minutes Intravenous  Once 01/29/2021 2140 01/12/2021 2145   02/03/2021 2145  ceFEPIme (MAXIPIME) 2 g in sodium chloride 0.9 % 100 mL IVPB  Status:  Discontinued        2 g 200 mL/hr over 30 Minutes Intravenous  Once 01/29/2021 2140 01/31/2021 2212   01/10/2021 1830  aztreonam (AZACTAM) 2 g in sodium chloride 0.9 % 100 mL IVPB        2 g 200 mL/hr over 30 Minutes Intravenous  Once 01/12/2021  1821 01/14/2021 2034       Subjective: Without complaints, somewhat confused this AM while briefly off bipap  Objective: Vitals:   02/14/21 1300 02/14/21 1400 02/14/21 1500 02/14/21 1600  BP: 114/70 (!) 102/46 (!) 102/58 (!) 108/54  Pulse: 83 80 80 79  Resp: (!) '28 14 18 14  '$ Temp:      TempSrc:      SpO2: 100% 99% 97% 99%  Weight:      Height:        Intake/Output Summary (Last 24 hours) at 02/14/2021 1626 Last data filed at 02/14/2021 0930 Gross per 24 hour  Intake 500 ml  Output 1800 ml  Net -1300 ml   Filed Weights   02/12/21 0500 02/13/21 0437 02/14/21 0403  Weight: (!) 184 kg (!) 181.6 kg (!) 179.9 kg    Examination: General exam: Awake, laying in bed, in nad Respiratory system: Normal respiratory effort, no wheezing Cardiovascular system: regular rate, s1, s2 Gastrointestinal system: Soft, nondistended, positive BS Central nervous system: CN2-12 grossly intact, strength intact Extremities: Perfused, no clubbing Skin: Normal skin turgor, no notable skin lesions seen Psychiatry: Mood normal // no visual hallucinations   Data Reviewed: I have personally reviewed following labs and imaging studies  CBC: Recent Labs  Lab 02/09/21 0512 02/10/21 0534 02/11/21 0523 02/13/21 0518 02/14/21 0524  WBC 8.4 5.8 5.7 6.0 5.5  NEUTROABS 5.6  --   --   --   --   HGB 8.2* 8.3* 8.5* 8.0* 8.4*  HCT 26.8* 27.4* 27.5* 26.1* 28.2*  MCV 93.7 93.5 94.2 93.9 95.6  PLT 78* 88* 95* 96* 94*   Basic Metabolic Panel: Recent Labs  Lab 02/09/21 0512 02/10/21 0534 02/11/21 0523 02/12/21 0959 02/13/21 0518 02/14/21 0524  NA 133* 134* 137 136 139 139  K 3.9 4.0 3.9 3.7 3.8 4.0  CL 103 103 104 105 108 107  CO2 '25 25 25 25 25 26  '$ GLUCOSE 124* 132* 114* 133* 112* 109*  BUN 35* 36* 36* 35* 38* 39*  CREATININE 2.79* 2.86* 2.78* 2.60* 2.56* 2.51*  CALCIUM 7.8* 7.6* 7.7* 7.7* 7.7* 7.9*  MG 1.8 1.9 1.8  --  1.9 1.9   GFR: Estimated Creatinine Clearance: 46.3 mL/min (A) (by C-G  formula based on SCr of  2.51 mg/dL (H)). Liver Function Tests: Recent Labs  Lab 02/08/21 0421 02/14/21 0524  AST 25 20  ALT 18 15  ALKPHOS 147* 106  BILITOT 1.3* 1.0  PROT 7.1 7.0  ALBUMIN 2.0* 1.8*   No results for input(s): LIPASE, AMYLASE in the last 168 hours. Recent Labs  Lab 02/13/21 0846 02/13/21 0900  AMMONIA 41* 21   Coagulation Profile: No results for input(s): INR, PROTIME in the last 168 hours. Cardiac Enzymes: Recent Labs  Lab 02/10/21 0534  CKTOTAL 17*   BNP (last 3 results) No results for input(s): PROBNP in the last 8760 hours. HbA1C: No results for input(s): HGBA1C in the last 72 hours. CBG: Recent Labs  Lab 02/08/21 0834  GLUCAP 129*   Lipid Profile: No results for input(s): CHOL, HDL, LDLCALC, TRIG, CHOLHDL, LDLDIRECT in the last 72 hours. Thyroid Function Tests: No results for input(s): TSH, T4TOTAL, FREET4, T3FREE, THYROIDAB in the last 72 hours. Anemia Panel: No results for input(s): VITAMINB12, FOLATE, FERRITIN, TIBC, IRON, RETICCTPCT in the last 72 hours. Sepsis Labs: Recent Labs  Lab 02/08/21 0421  PROCALCITON 6.62    Recent Results (from the past 240 hour(s))  Culture, blood (routine x 2)     Status: Abnormal   Collection Time: 02/06/21 12:27 PM   Specimen: Right Antecubital; Blood  Result Value Ref Range Status   Specimen Description   Final    RIGHT ANTECUBITAL BOTTLES DRAWN AEROBIC AND ANAEROBIC Performed at [facility], [address] [place], [place] [address]    Special Requests   Final    Blood Culture adequate volume Performed at [facility], [address] [place], [place] [address]    Culture  Setup Time   Final    AEROBIC BOTTLE GRAM NEGATIVE RODS CRITICAL RESULT CALLED TO, READ BACK BY AND VERIFIED WITH: BROWN,S 2329 02/07/2021 [place],R CRITICAL RESULT CALLED TO, READ BACK BY AND VERIFIED WITH: S BROWN,RN'@0448'$  02/08/21 [place] Performed at [facility], [place] [address], [place], [place] [address]     Culture PSEUDOMONAS AERUGINOSA (A)  Final   Report Status 02/10/2021 FINAL  Final   Organism ID, Bacteria PSEUDOMONAS AERUGINOSA  Final      Susceptibility   Pseudomonas aeruginosa - MIC*    CEFTAZIDIME 4 SENSITIVE Sensitive     CIPROFLOXACIN <=0.25 SENSITIVE Sensitive     GENTAMICIN <=1 SENSITIVE Sensitive     IMIPENEM 1 SENSITIVE Sensitive     CEFEPIME 2 SENSITIVE Sensitive     * PSEUDOMONAS AERUGINOSA  Culture, blood (routine x 2)     Status: None   Collection Time: 02/06/21 12:27 PM   Specimen: BLOOD RIGHT HAND  Result Value Ref Range Status   Specimen Description BLOOD RIGHT HAND BOTTLES DRAWN AEROBIC ONLY  Final   Special Requests Blood Culture adequate volume  Final   Culture   Final    NO GROWTH 5 DAYS Performed at [facility], [address] [place],  [address]    Report Status 02/11/2021 FINAL  Final  Blood Culture ID Panel (Reflexed)     Status: Abnormal   Collection Time: 02/06/21 12:27 PM  Result Value Ref Range Status   Enterococcus faecalis NOT DETECTED NOT DETECTED Final   Enterococcus Faecium NOT DETECTED NOT DETECTED Final   Listeria monocytogenes NOT DETECTED NOT DETECTED Final   Staphylococcus species NOT DETECTED NOT DETECTED Final   Staphylococcus aureus (BCID) NOT DETECTED NOT DETECTED Final   Staphylococcus epidermidis NOT DETECTED NOT DETECTED Final   Staphylococcus lugdunensis  NOT DETECTED NOT DETECTED Final   Streptococcus species NOT DETECTED NOT DETECTED Final   Streptococcus agalactiae NOT DETECTED NOT DETECTED Final   Streptococcus pneumoniae NOT DETECTED NOT DETECTED Final   Streptococcus pyogenes NOT DETECTED NOT DETECTED Final   A.calcoaceticus-baumannii NOT DETECTED NOT DETECTED Final   Bacteroides fragilis NOT DETECTED NOT DETECTED Final   Enterobacterales NOT DETECTED NOT DETECTED Final   Enterobacter cloacae complex NOT DETECTED NOT DETECTED Final   Escherichia coli NOT DETECTED NOT DETECTED Final   Klebsiella aerogenes NOT  DETECTED NOT DETECTED Final   Klebsiella oxytoca NOT DETECTED NOT DETECTED Final   Klebsiella pneumoniae NOT DETECTED NOT DETECTED Final   Proteus species NOT DETECTED NOT DETECTED Final   Salmonella species NOT DETECTED NOT DETECTED Final   Serratia marcescens NOT DETECTED NOT DETECTED Final   Haemophilus influenzae NOT DETECTED NOT DETECTED Final   Neisseria meningitidis NOT DETECTED NOT DETECTED Final   Pseudomonas aeruginosa DETECTED (A) NOT DETECTED Final    Comment: CRITICAL RESULT CALLED TO, READ BACK BY AND VERIFIED WITH: S BROWN,RN'@0449'$  02/08/21 [place]    Stenotrophomonas maltophilia NOT DETECTED NOT DETECTED Final   Candida albicans NOT DETECTED NOT DETECTED Final   Candida auris NOT DETECTED NOT DETECTED Final   Candida glabrata NOT DETECTED NOT DETECTED Final   Candida krusei NOT DETECTED NOT DETECTED Final   Candida parapsilosis NOT DETECTED NOT DETECTED Final   Candida tropicalis NOT DETECTED NOT DETECTED Final   Cryptococcus neoformans/gattii NOT DETECTED NOT DETECTED Final   CTX-M ESBL NOT DETECTED NOT DETECTED Final   Carbapenem resistance IMP NOT DETECTED NOT DETECTED Final   Carbapenem resistance KPC NOT DETECTED NOT DETECTED Final   Carbapenem resistance NDM NOT DETECTED NOT DETECTED Final   Carbapenem resistance VIM NOT DETECTED NOT DETECTED Final    Comment: Performed at [facility], [address] [place], [place] [address]  Culture, blood (routine x 2)     Status: None (Preliminary result)   Collection Time: 02/11/21 10:02 AM   Specimen: Right Antecubital; Blood  Result Value Ref Range Status   Specimen Description RIGHT ANTECUBITAL  Final   Special Requests   Final    BOTTLES DRAWN AEROBIC AND ANAEROBIC Blood Culture results may not be optimal due to an excessive volume of blood received in culture bottles   Culture   Final    NO GROWTH 3 DAYS Performed at [facility], [address] [place], [place] [address]    Report Status PENDING  Incomplete   Culture, blood (routine x 2)     Status: None (Preliminary result)   Collection Time: 02/11/21 10:02 AM   Specimen: BLOOD RIGHT ARM  Result Value Ref Range Status   Specimen Description BLOOD RIGHT ARM  Final   Special Requests   Final    BOTTLES DRAWN AEROBIC AND ANAEROBIC Blood Culture adequate volume   Culture   Final    NO GROWTH 3 DAYS Performed at [facility], [address] [place],  [address]    Report Status PENDING  Incomplete  Blood culture (routine single)     Status: None (Preliminary result)   Collection Time: 02/12/21  9:59 AM   Specimen: Right Antecubital; Blood  Result Value Ref Range Status   Specimen Description RIGHT ANTECUBITAL  Final   Special Requests   Final    BOTTLES DRAWN AEROBIC AND ANAEROBIC Blood Culture adequate volume   Culture   Final    NO GROWTH 2 DAYS Performed at [facility], [address], [place], [place] [address]    Report Status PENDING  Incomplete     Radiology Studies: DG CHEST PORT 1 VIEW  Result Date: 02/14/2021 CLINICAL DATA:  65 year old male with history of shortness of breath and fever. Cellulitis of the chest wall. EXAM: PORTABLE CHEST 1 VIEW COMPARISON:  Chest x-ray 02/13/2021. FINDINGS: Lung volumes are normal. Severe diffuse peribronchial cuffing and widespread interstitial prominence. Opacity obscuring the lateral aspect of the left hemidiaphragm. No pneumothorax. No definite right pleural effusion. Cephalization of the pulmonary vasculature. Mild cardiomegaly. The patient is rotated to the left on today's exam, resulting in distortion of the mediastinal contours and reduced diagnostic sensitivity and specificity for mediastinal pathology. IMPRESSION: 1. The appearance the chest could simply reflect congestive heart failure, although the possibility of underlying bronchitis is also suspected. 2. Obscuration of the lateral left hemidiaphragm which may reflect a focal area of atelectasis and/or consolidation, potentially  with small left pleural effusion. Attention on follow-up is recommended. Electronically Signed   By: Vinnie Langton M.D.   On: 02/14/2021 08:17   DG CHEST PORT 1 VIEW  Result Date: 02/13/2021 CLINICAL DATA:  Shortness of breath.  Hypertension.  Sleep apnea. EXAM: PORTABLE CHEST 1 VIEW COMPARISON:  02/08/2021 and 12/17/2020 FINDINGS: Mild cardiomegaly stable. Pulmonary vascular congestion is unchanged. No evidence of acute infiltrate or pleural effusion. IMPRESSION: Stable cardiomegaly and pulmonary vascular congestion. No acute findings. Electronically Signed   By: Marlaine Hind M.D.   On: 02/13/2021 13:18    Scheduled Meds:  acetaminophen  650 mg Oral Q6H   Or   acetaminophen  650 mg Rectal Q6H   Chlorhexidine Gluconate Cloth  6 each Topical Daily   finasteride  5 mg Oral Daily   fluticasone  1 spray Each Nare Daily   folic acid  1 mg Oral Daily   furosemide  40 mg Intravenous Q12H   gabapentin  100 mg Oral BID   nystatin   Topical BID   rifampin  300 mg Oral Q12H   saccharomyces boulardii  250 mg Oral BID   sodium chloride flush  3 mL Intravenous Q8H   tamsulosin  0.4 mg Oral QHS   topiramate  100 mg Oral Daily   Continuous Infusions:  ceFEPime (MAXIPIME) IV 2 g (02/14/21 0850)     LOS: 15 days   Marylu Lund, MD [facility] Pager On Amion  If 7PM-7AM, please contact night-coverage 02/14/2021, 4:26 PM

## 2021-02-15 ENCOUNTER — Inpatient Hospital Stay (HOSPITAL_COMMUNITY): Payer: Medicare HMO

## 2021-02-15 DIAGNOSIS — L03313 Cellulitis of chest wall: Secondary | ICD-10-CM | POA: Diagnosis not present

## 2021-02-15 LAB — CBC
HCT: 27.9 % — ABNORMAL LOW (ref 39.0–52.0)
Hemoglobin: 8.5 g/dL — ABNORMAL LOW (ref 13.0–17.0)
MCH: 28.7 pg (ref 26.0–34.0)
MCHC: 30.5 g/dL (ref 30.0–36.0)
MCV: 94.3 fL (ref 80.0–100.0)
Platelets: 107 10*3/uL — ABNORMAL LOW (ref 150–400)
RBC: 2.96 MIL/uL — ABNORMAL LOW (ref 4.22–5.81)
RDW: 17.5 % — ABNORMAL HIGH (ref 11.5–15.5)
WBC: 6.5 10*3/uL (ref 4.0–10.5)
nRBC: 0 % (ref 0.0–0.2)

## 2021-02-15 LAB — BLOOD GAS, VENOUS
Acid-base deficit: 1.1 mmol/L (ref 0.0–2.0)
Bicarbonate: 21.7 mmol/L (ref 20.0–28.0)
FIO2: 28
O2 Saturation: 75.6 %
Patient temperature: 37.1
pCO2, Ven: 60.7 mmHg — ABNORMAL HIGH (ref 44.0–60.0)
pH, Ven: 7.244 — ABNORMAL LOW (ref 7.250–7.430)
pO2, Ven: 46.8 mmHg — ABNORMAL HIGH (ref 32.0–45.0)

## 2021-02-15 LAB — BLOOD GAS, ARTERIAL
Acid-base deficit: 2.2 mmol/L — ABNORMAL HIGH (ref 0.0–2.0)
Bicarbonate: 22.2 mmol/L (ref 20.0–28.0)
FIO2: 28
O2 Saturation: 97 %
Patient temperature: 37
pCO2 arterial: 48.8 mmHg — ABNORMAL HIGH (ref 32.0–48.0)
pH, Arterial: 7.299 — ABNORMAL LOW (ref 7.350–7.450)
pO2, Arterial: 98.2 mmHg (ref 83.0–108.0)

## 2021-02-15 LAB — AMMONIA: Ammonia: 42 umol/L — ABNORMAL HIGH (ref 9–35)

## 2021-02-15 LAB — COMPREHENSIVE METABOLIC PANEL
ALT: 17 U/L (ref 0–44)
AST: 22 U/L (ref 15–41)
Albumin: 1.9 g/dL — ABNORMAL LOW (ref 3.5–5.0)
Alkaline Phosphatase: 93 U/L (ref 38–126)
Anion gap: 6 (ref 5–15)
BUN: 41 mg/dL — ABNORMAL HIGH (ref 8–23)
CO2: 24 mmol/L (ref 22–32)
Calcium: 7.9 mg/dL — ABNORMAL LOW (ref 8.9–10.3)
Chloride: 109 mmol/L (ref 98–111)
Creatinine, Ser: 2.63 mg/dL — ABNORMAL HIGH (ref 0.61–1.24)
GFR, Estimated: 26 mL/min — ABNORMAL LOW (ref >60)
Glucose, Bld: 100 mg/dL — ABNORMAL HIGH (ref 70–99)
Potassium: 3.9 mmol/L (ref 3.5–5.1)
Sodium: 139 mmol/L (ref 135–145)
Total Bilirubin: 1.1 mg/dL (ref 0.3–1.2)
Total Protein: 7.2 g/dL (ref 6.5–8.1)

## 2021-02-15 LAB — GLUCOSE, CAPILLARY: Glucose-Capillary: 83 mg/dL (ref 70–99)

## 2021-02-15 MED ORDER — NOREPINEPHRINE 4 MG/250ML-% IV SOLN
2.0000 ug/min | INTRAVENOUS | Status: DC
  Administered 2021-02-16: 2 ug/min via INTRAVENOUS
  Administered 2021-02-17 (×2): 8 ug/min via INTRAVENOUS
  Administered 2021-02-18: 2 ug/min via INTRAVENOUS
  Filled 2021-02-15 (×4): qty 250

## 2021-02-15 MED ORDER — ALBUMIN HUMAN 25 % IV SOLN
12.5000 g | Freq: Once | INTRAVENOUS | Status: AC
  Administered 2021-02-15: 12.5 g via INTRAVENOUS
  Filled 2021-02-15: qty 50

## 2021-02-15 MED ORDER — LACTULOSE 10 GM/15ML PO SOLN: 20.0000 g | Freq: Three times a day (TID) | ORAL | Status: DC

## 2021-02-15 MED ORDER — SODIUM CHLORIDE 0.9 % IV SOLN: 250.0000 mL | INTRAVENOUS | Status: DC

## 2021-02-15 MED ORDER — LACTULOSE ENEMA
300.0000 mL | Freq: Once | ORAL | Status: AC
  Administered 2021-02-15: 300 mL via RECTAL
  Filled 2021-02-15: qty 300

## 2021-02-15 NOTE — Progress Notes (Signed)
Patient ID: JVEON PADFIELD, male   DOB: 11-16-1955, 65 y.o.   MRN: [id number]  Asked to see patient due to increasing confusion. Patient on BiPAP, s/p lactulose enema for increasing ammonia levels. On BiPAP since 10/7 for hypercarbia. This has not changed much since being on the BiPAP. pH has remained about the same. Not compensated - Bicarb is normal.   BP 96/64   Pulse 97   Temp 99.2 F (37.3 C) (Axillary)   Resp (!) 21   Ht '5\' 6"'$  (1.676 m)   Wt (!) 179.1 kg   SpO2 95%   BMI 63.73 kg/m  Patient alert, but confused.  Moving air well. Diminished breath sounds. Tidal volumes 1000-1200.  RR 16-21  Will get ABG, CXR.  Plan to follow [place], DO

## 2021-02-15 NOTE — Progress Notes (Signed)
Patient off BiPAP since 23;37 10/8 -- He has watched tv most of night.

## 2021-02-15 NOTE — Progress Notes (Signed)
RN called due to patient's BP still being soft despite albumin infusion given earlier.  Patient appears to be fluid overloaded and was on Lasix 40 mg twice daily.  Peripheral Levophed will be started at this time to maintain MAP > or = to 65  Blood pressure (!) 94/46, pulse 88, temperature 98.3 F (36.8 C), temperature source Oral, resp. rate (!) 22, height '5\' 6"'$  (1.676 m), weight (!) 179.1 kg, SpO2 94 %.

## 2021-02-15 NOTE — Progress Notes (Signed)
Patient in bed, flat , awakens with jerks states " shit", did this 3 times while I watched him. Suspect he is about at base line for Blood gas results. Confusion probably not related PCO2 .  Will place back on BiPAP for sleep.

## 2021-02-15 NOTE — Progress Notes (Signed)
ABG stuck in RR at 1655. Results pending.

## 2021-02-15 NOTE — Progress Notes (Signed)
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   In the emergency department, patient was notably tachycardic, febrile with a temperature of 101.0 F, BP 118/39, SPO2 97% on 2 L nasal cannula.  Lab work-up notable for leukocytosis, normocytic anemia, creatinine 2.63, influenza A/B and COVID-19 PCR negative.  Chest x-ray with cardiomegaly with findings suggestive of CHF/pulmonary edema. CT chest without contrast with marked severity cellulitis along the lateral aspect of the mid and lower chest wall, marked severity splenomegaly.  Patient was started on IV Azactam, Tylenol given due to fever and started on IV fluid hydration. TRH consulted for further evaluation and management of sepsis.    Assessment & Plan:   Principal Problem:   Cellulitis of chest wall Active Problems:   Obstructive sleep apnea   Chronic low back pain   Morbid obesity with BMI of 60.0-69.9, adult (HCC)   Multiple falls   Elevated brain natriuretic peptide (BNP) level   Pulmonary edema   Splenomegaly, not elsewhere classified   Ascites   Chronic venous stasis dermatitis of both lower extremities   Chronic kidney disease   Acute febrile illness   Acute respiratory failure with hypoxia (HCC)   Cellulitis of scrotum   Bacteremia due to Pseudomonas   Acute dyspnea   Acute metabolic encephalopathy-worsening Hypoxemia, hypercapnia Recently noted to be more confused and encephalopathic on 10/8 Recent abg suggests hypercarbia, pt is continued  on bipap as tolerated This AM, pt is awake, conversant, somewhat confused -Cont with bipap as tolerated -Strongly recommend outpt sleep study if not done already -Noted to have ongoing hypercapnia as well as elevating ammonia levels.  Plan to give lactulose enema today and continue BiPAP.  Continue close monitoring.   Pseudomonas septicemia Patient during hospitalization continued with intermittent fevers.   -Pt was initially continued with PICC line for outpatient antibiotics for recent right prosthetic knee infection. PICC line has since been removed on 02/10/2021 given concerns for line infection --Infectious disease following, appreciate assistance --Continued on cefepime 2g IV q12h, started 02/08/2021 --Repeat blood cultures x2 10/5: No growth x2 days --Cont course of abx for now -Further recommendations from ID pending   Right knee prostatic joint infection Patient developed MSSE infection of right knee prosthesis, evaluated at [place].  Discharged on long-term antibiotics for 6 weeks to end 02/25/2021 via PICC line with vancomycin and rifampin. --was initially continued on daptomycin at [facility], since later being discontinued by ID --Continue rifampin 300 mg p.o. twice daily --Outpatient follow-up with [facility]   Acute renal failure on CKD stage IV Baseline creatinine 1.8-2.5.  Received IV fluid hydration. 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  Appears stable and at baseline. --Furosemide '40mg'$  IV q12h -- Strict I's and O's and daily weights -Recent ammonia levels are stable   Chronic venous stasis dermatitis bilateral lower extremities, chronic --Supportive care --  Lower extremity elevation --Furosemide as  above   Essential hypertension --Furosemide 40 mg IV q12h   BPH: Finasteride 5 mg p.o. daily, tamsulosin 0.4 mg nightly   Chest wall cellulitis: Resolved Scrotal cellulitis: Resolved   Morbid obesity Body mass index is 64.62 kg/m.  Discussed with patient needs for aggressive lifestyle changes/weight loss as this complicates all facets of care.  Outpatient follow-up with PCP.  May benefit from bariatric evaluation outpatient. Recommend sleep study if not done already   Weakness/deconditioning/debility: From [facility].  Seen by PT/OT recommending return to SNF. --Pending insurance authorization for [employer], [place]   DVT prophylaxis: SCD's Code Status: Full Family Communication: Pt in room, family not at bedside   Status is: Inpatient   Remains inpatient appropriate because:Inpatient level of care appropriate due to severity of illness   Dispo: The patient is from: SNF              Anticipated d/c is to: SNF              Patient currently is not medically stable to d/c.              Difficult to place patient No     Consultants:  ID   Procedures:   See below  Antimicrobials:  Anti-infectives (From admission, onward)    Start     Dose/Rate Route Frequency Ordered Stop   02/11/21 2200  rifampin (RIFADIN) capsule 300 mg        300 mg Oral Every 12 hours 02/11/21 0925     02/08/21 2000  ceFEPIme (MAXIPIME) 2 g in sodium chloride 0.9 % 100 mL IVPB        2 g 200 mL/hr over 30 Minutes Intravenous Every 12 hours 02/08/21 1302     02/08/21 1200  piperacillin-tazobactam (ZOSYN) IVPB 3.375 g  Status:  Discontinued        3.375 g 12.5 mL/hr over 240 Minutes Intravenous Every 8 hours 02/08/21 0558 02/08/21 1302   02/08/21 0600  piperacillin-tazobactam (ZOSYN) IVPB 3.375 g        3.375 g 100 mL/hr over 30 Minutes Intravenous STAT 02/08/21 0558 02/08/21 0716   02/03/21 2000  DAPTOmycin (CUBICIN) 900 mg in sodium chloride 0.9 % IVPB  Status:  Discontinued        900  mg 136 mL/hr over 30 Minutes Intravenous Daily 02/03/21 1516 02/11/21 0925   02/02/21 1410  vancomycin variable dose per unstable renal function (pharmacist dosing)  Status:  Discontinued         Does not apply See admin instructions 02/02/21 1410 02/03/21 1516   02/02/21 1400  rifampin (RIFADIN) capsule 300 mg  Status:  Discontinued        300 mg Oral 3 times daily 02/02/21 1106 02/11/21 0925   02/02/21 1330  amoxicillin-clavulanate (AUGMENTIN) 875-125 MG per tablet 1 tablet        1 tablet Oral Every 12 hours 02/02/21 1238 02/06/21 2026   02/02/21 1000  rifampin (RIFADIN) capsule 450 mg  Status:  Discontinued        450 mg Oral Every 12 hours 02/02/21 0701 02/02/21 1106   02/02/21 0830  vancomycin (VANCOREADY) IVPB 2000 mg/400 mL        2,000 mg 200 mL/hr over 120 Minutes Intravenous  Once 02/02/21 0803 02/02/21 1547   01/31/21 0330  ceFEPIme (MAXIPIME) 2 g in sodium chloride 0.9 % 100 mL IVPB  Status:  Discontinued  2 g 200 mL/hr over 30 Minutes Intravenous Every 12 hours 01/28/2021 2214 02/02/21 0701   01/31/21 0030  linezolid (ZYVOX) tablet 600 mg  Status:  Discontinued        600 mg Oral Every 12 hours 01/16/2021 2344 02/02/21 0701   01/26/2021 2330  vancomycin (VANCOREADY) IVPB 1500 mg/300 mL  Status:  Discontinued       See Hyperspace for full Linked Orders Report.   1,500 mg 150 mL/hr over 120 Minutes Intravenous  Once 01/10/2021 2148 01/10/2021 2151   01/14/2021 2230  vancomycin (VANCOCIN) IVPB 1000 mg/200 mL premix  Status:  Discontinued       See Hyperspace for full Linked Orders Report.   1,000 mg 200 mL/hr over 60 Minutes Intravenous  Once 01/27/2021 2148 01/09/2021 2151   02/04/2021 2145  vancomycin (VANCOCIN) IVPB 1000 mg/200 mL premix  Status:  Discontinued        1,000 mg 200 mL/hr over 60 Minutes Intravenous  Once 02/02/2021 2140 01/11/2021 2145   01/17/2021 2145  ceFEPIme (MAXIPIME) 2 g in sodium chloride 0.9 % 100 mL IVPB  Status:  Discontinued        2 g 200 mL/hr over 30 Minutes  Intravenous  Once 01/18/2021 2140 01/28/2021 2212   02/03/2021 1830  aztreonam (AZACTAM) 2 g in sodium chloride 0.9 % 100 mL IVPB        2 g 200 mL/hr over 30 Minutes Intravenous  Once 01/29/2021 1821 01/28/2021 2034      Subjective: Patient seen and evaluated today and appears confused.  He is not conversational, but is alert and opens his eyes.  He is currently on BiPAP.  He apparently did not wear his BiPAP as much over the course of the evening.  Objective: Vitals:   02/15/21 0400 02/15/21 0500 02/15/21 0720 02/15/21 0819  BP:      Pulse:   92 91  Resp:   16 18  Temp: 98.3 F (36.8 C)  98.8 F (37.1 C)   TempSrc: Oral  Oral   SpO2:   97% 96%  Weight:  (!) 179.1 kg    Height:        Intake/Output Summary (Last 24 hours) at 02/15/2021 1000 Last data filed at 02/15/2021 0500 Gross per 24 hour  Intake 650 ml  Output 1300 ml  Net -650 ml   Filed Weights   02/13/21 0437 02/14/21 0403 02/15/21 0500  Weight: (!) 181.6 kg (!) 179.9 kg (!) 179.1 kg    Examination:  General exam: Appears altered Respiratory system: Clear to auscultation. Respiratory effort normal.  BiPAP 14/620%. Cardiovascular system: S1 & S2 heard, RRR.  Gastrointestinal system: Abdomen is soft Central nervous system: Alert, but unresponsive Extremities: No edema Skin: No significant lesions noted Psychiatry: Flat affect.    Data Reviewed: I have personally reviewed following labs and imaging studies  CBC: Recent Labs  Lab 02/09/21 0512 02/10/21 0534 02/11/21 0523 02/13/21 0518 02/14/21 0524 02/15/21 0519  WBC 8.4 5.8 5.7 6.0 5.5 6.5  NEUTROABS 5.6  --   --   --   --   --   HGB 8.2* 8.3* 8.5* 8.0* 8.4* 8.5*  HCT 26.8* 27.4* 27.5* 26.1* 28.2* 27.9*  MCV 93.7 93.5 94.2 93.9 95.6 94.3  PLT 78* 88* 95* 96* 94* [id number]*   Basic Metabolic Panel: Recent Labs  Lab 02/09/21 0512 02/10/21 0534 02/11/21 0523 02/12/21 0959 02/13/21 0518 02/14/21 0524 02/15/21 0519  NA 133* 134* 137 136 139 139 139  K  3.9  4.0 3.9 3.7 3.8 4.0 3.9  CL 103 103 104 105 108 107 109  CO2 '25 25 25 25 25 26 24  '$ GLUCOSE 124* 132* 114* 133* 112* 109* 100*  BUN 35* 36* 36* 35* 38* 39* 41*  CREATININE 2.79* 2.86* 2.78* 2.60* 2.56* 2.51* 2.63*  CALCIUM 7.8* 7.6* 7.7* 7.7* 7.7* 7.9* 7.9*  MG 1.8 1.9 1.8  --  1.9 1.9  --    GFR: Estimated Creatinine Clearance: 44.1 mL/min (A) (by C-G formula based on SCr of 2.63 mg/dL (H)). Liver Function Tests: Recent Labs  Lab 02/14/21 0524 02/15/21 0519  AST 20 22  ALT 15 17  ALKPHOS 106 93  BILITOT 1.0 1.1  PROT 7.0 7.2  ALBUMIN 1.8* 1.9*   No results for input(s): LIPASE, AMYLASE in the last 168 hours. Recent Labs  Lab 02/13/21 0846 02/13/21 0900 02/15/21 0818  AMMONIA 41* 21 42*   Coagulation Profile: No results for input(s): INR, PROTIME in the last 168 hours. Cardiac Enzymes: Recent Labs  Lab 02/10/21 0534  CKTOTAL 17*   BNP (last 3 results) No results for input(s): PROBNP in the last 8760 hours. HbA1C: No results for input(s): HGBA1C in the last 72 hours. CBG: No results for input(s): GLUCAP in the last 168 hours. Lipid Profile: No results for input(s): CHOL, HDL, LDLCALC, TRIG, CHOLHDL, LDLDIRECT in the last 72 hours. Thyroid Function Tests: No results for input(s): TSH, T4TOTAL, FREET4, T3FREE, THYROIDAB in the last 72 hours. Anemia Panel: No results for input(s): VITAMINB12, FOLATE, FERRITIN, TIBC, IRON, RETICCTPCT in the last 72 hours. Sepsis Labs: No results for input(s): PROCALCITON, LATICACIDVEN in the last 168 hours.  Recent Results (from the past 240 hour(s))  Culture, blood (routine x 2)     Status: Abnormal   Collection Time: 02/06/21 12:27 PM   Specimen: Right Antecubital; Blood  Result Value Ref Range Status   Specimen Description   Final    RIGHT ANTECUBITAL BOTTLES DRAWN AEROBIC AND ANAEROBIC Performed at [facility], [address] [place], [place] [address]    Special Requests   Final    Blood Culture adequate  volume Performed at [facility], [address] [place], [place] [address]    Culture  Setup Time   Final    AEROBIC BOTTLE GRAM NEGATIVE RODS CRITICAL RESULT CALLED TO, READ BACK BY AND VERIFIED WITH: BROWN,S 2329 02/07/2021 [place],R CRITICAL RESULT CALLED TO, READ BACK BY AND VERIFIED WITH: S BROWN,RN'@[id number]'$  02/08/21 [place] Performed at [facility], [place] [address], [place], [place] [address]    Culture PSEUDOMONAS AERUGINOSA (A)  Final   Report Status 02/10/2021 FINAL  Final   Organism ID, Bacteria PSEUDOMONAS AERUGINOSA  Final      Susceptibility   Pseudomonas aeruginosa - MIC*    CEFTAZIDIME 4 SENSITIVE Sensitive     CIPROFLOXACIN <=0.25 SENSITIVE Sensitive     GENTAMICIN <=1 SENSITIVE Sensitive     IMIPENEM 1 SENSITIVE Sensitive     CEFEPIME 2 SENSITIVE Sensitive     * PSEUDOMONAS AERUGINOSA  Culture, blood (routine x 2)     Status: None   Collection Time: 02/06/21 12:27 PM   Specimen: BLOOD RIGHT HAND  Result Value Ref Range Status   Specimen Description BLOOD RIGHT HAND BOTTLES DRAWN AEROBIC ONLY  Final   Special Requests Blood Culture adequate volume  Final   Culture   Final    NO GROWTH 5 DAYS Performed at [facility], [address], [place], [place] [address]  Report Status 02/11/2021 FINAL  Final  Blood Culture ID Panel (Reflexed)     Status: Abnormal   Collection Time: 02/06/21 12:27 PM  Result Value Ref Range Status   Enterococcus faecalis NOT DETECTED NOT DETECTED Final   Enterococcus Faecium NOT DETECTED NOT DETECTED Final   Listeria monocytogenes NOT DETECTED NOT DETECTED Final   Staphylococcus species NOT DETECTED NOT DETECTED Final   Staphylococcus aureus (BCID) NOT DETECTED NOT DETECTED Final   Staphylococcus epidermidis NOT DETECTED NOT DETECTED Final   Staphylococcus lugdunensis NOT DETECTED NOT DETECTED Final   Streptococcus species NOT DETECTED NOT DETECTED Final   Streptococcus agalactiae NOT DETECTED NOT DETECTED Final   Streptococcus  pneumoniae NOT DETECTED NOT DETECTED Final   Streptococcus pyogenes NOT DETECTED NOT DETECTED Final   A.calcoaceticus-baumannii NOT DETECTED NOT DETECTED Final   Bacteroides fragilis NOT DETECTED NOT DETECTED Final   Enterobacterales NOT DETECTED NOT DETECTED Final   Enterobacter cloacae complex NOT DETECTED NOT DETECTED Final   Escherichia coli NOT DETECTED NOT DETECTED Final   Klebsiella aerogenes NOT DETECTED NOT DETECTED Final   Klebsiella oxytoca NOT DETECTED NOT DETECTED Final   Klebsiella pneumoniae NOT DETECTED NOT DETECTED Final   Proteus species NOT DETECTED NOT DETECTED Final   Salmonella species NOT DETECTED NOT DETECTED Final   Serratia marcescens NOT DETECTED NOT DETECTED Final   Haemophilus influenzae NOT DETECTED NOT DETECTED Final   Neisseria meningitidis NOT DETECTED NOT DETECTED Final   Pseudomonas aeruginosa DETECTED (A) NOT DETECTED Final    Comment: CRITICAL RESULT CALLED TO, READ BACK BY AND VERIFIED WITH: S BROWN,RN'@[id number]'$  02/08/21 [place]    Stenotrophomonas maltophilia NOT DETECTED NOT DETECTED Final   Candida albicans NOT DETECTED NOT DETECTED Final   Candida auris NOT DETECTED NOT DETECTED Final   Candida glabrata NOT DETECTED NOT DETECTED Final   Candida krusei NOT DETECTED NOT DETECTED Final   Candida parapsilosis NOT DETECTED NOT DETECTED Final   Candida tropicalis NOT DETECTED NOT DETECTED Final   Cryptococcus neoformans/gattii NOT DETECTED NOT DETECTED Final   CTX-M ESBL NOT DETECTED NOT DETECTED Final   Carbapenem resistance IMP NOT DETECTED NOT DETECTED Final   Carbapenem resistance KPC NOT DETECTED NOT DETECTED Final   Carbapenem resistance NDM NOT DETECTED NOT DETECTED Final   Carbapenem resistance VIM NOT DETECTED NOT DETECTED Final    Comment: Performed at [facility], [address], [place], [place] [address]  Culture, blood (routine x 2)     Status: None (Preliminary result)   Collection Time: 02/11/21 10:02 AM   Specimen: Right  Antecubital; Blood  Result Value Ref Range Status   Specimen Description RIGHT ANTECUBITAL  Final   Special Requests   Final    BOTTLES DRAWN AEROBIC AND ANAEROBIC Blood Culture results may not be optimal due to an excessive volume of blood received in culture bottles   Culture   Final    NO GROWTH 3 DAYS Performed at [facility], [address], [place], [place] [address]    Report Status PENDING  Incomplete  Culture, blood (routine x 2)     Status: None (Preliminary result)   Collection Time: 02/11/21 10:02 AM   Specimen: BLOOD RIGHT ARM  Result Value Ref Range Status   Specimen Description BLOOD RIGHT ARM  Final   Special Requests   Final    BOTTLES DRAWN AEROBIC AND ANAEROBIC Blood Culture adequate volume   Culture   Final    NO GROWTH 3 DAYS Performed at [facility],  [address] [place], [place] [address]    Report Status PENDING  Incomplete  Blood culture (routine single)     Status: None (Preliminary result)   Collection Time: 02/12/21  9:59 AM   Specimen: Right Antecubital; Blood  Result Value Ref Range Status   Specimen Description RIGHT ANTECUBITAL  Final   Special Requests   Final    BOTTLES DRAWN AEROBIC AND ANAEROBIC Blood Culture adequate volume   Culture   Final    NO GROWTH 2 DAYS Performed at [facility], [address], [place], [place] [address]    Report Status PENDING  Incomplete         Radiology Studies: DG CHEST PORT 1 VIEW  Result Date: 02/14/2021 CLINICAL DATA:  65 year old male with history of shortness of breath and fever. Cellulitis of the chest wall. EXAM: PORTABLE CHEST 1 VIEW COMPARISON:  Chest x-ray 02/13/2021. FINDINGS: Lung volumes are normal. Severe diffuse peribronchial cuffing and widespread interstitial prominence. Opacity obscuring the lateral aspect of the left hemidiaphragm. No pneumothorax. No definite right pleural effusion. Cephalization of the pulmonary vasculature. Mild cardiomegaly. The patient is rotated to the left on  today's exam, resulting in distortion of the mediastinal contours and reduced diagnostic sensitivity and specificity for mediastinal pathology. IMPRESSION: 1. The appearance the chest could simply reflect congestive heart failure, although the possibility of underlying bronchitis is also suspected. 2. Obscuration of the lateral left hemidiaphragm which may reflect a focal area of atelectasis and/or consolidation, potentially with small left pleural effusion. Attention on follow-up is recommended. Electronically Signed   By: Vinnie Langton M.D.   On: 02/14/2021 08:17   DG CHEST PORT 1 VIEW  Result Date: 02/13/2021 CLINICAL DATA:  Shortness of breath.  Hypertension.  Sleep apnea. EXAM: PORTABLE CHEST 1 VIEW COMPARISON:  02/08/2021 and 12/17/2020 FINDINGS: Mild cardiomegaly stable. Pulmonary vascular congestion is unchanged. No evidence of acute infiltrate or pleural effusion. IMPRESSION: Stable cardiomegaly and pulmonary vascular congestion. No acute findings. Electronically Signed   By: Marlaine Hind M.D.   On: 02/13/2021 13:18        Scheduled Meds:  acetaminophen  650 mg Oral Q6H   Or   acetaminophen  650 mg Rectal Q6H   Chlorhexidine Gluconate Cloth  6 each Topical Daily   finasteride  5 mg Oral Daily   fluticasone  1 spray Each Nare Daily   folic acid  1 mg Oral Daily   furosemide  40 mg Intravenous Q12H   lactulose  300 mL Rectal Once   nystatin   Topical BID   rifampin  300 mg Oral Q12H   saccharomyces boulardii  250 mg Oral BID   sodium chloride flush  3 mL Intravenous Q8H   tamsulosin  0.4 mg Oral QHS   topiramate  100 mg Oral Daily   Continuous Infusions:  ceFEPime (MAXIPIME) IV 2 g (02/15/21 0954)     LOS: 16 days    Time spent: 35 minutes    Khylin Gutridge Darleen Crocker, DO [facility]  If 7PM-7AM, please contact night-coverage [email or web address] 02/15/2021, 10:00 AM

## 2021-02-15 NOTE — Progress Notes (Signed)
Patient taken off bipap due to abg results ( pH 7.28, CO2 48.8, O2 98.0 and HCO3 22) and placed on 4L [place] with SPO2 98%.

## 2021-02-16 DIAGNOSIS — L03313 Cellulitis of chest wall: Secondary | ICD-10-CM | POA: Diagnosis not present

## 2021-02-16 LAB — HEMOGLOBIN AND HEMATOCRIT, BLOOD
HCT: 27 % — ABNORMAL LOW (ref 39.0–52.0)
Hemoglobin: 8.1 g/dL — ABNORMAL LOW (ref 13.0–17.0)

## 2021-02-16 LAB — COMPREHENSIVE METABOLIC PANEL
ALT: 16 U/L (ref 0–44)
AST: 24 U/L (ref 15–41)
Albumin: 1.8 g/dL — ABNORMAL LOW (ref 3.5–5.0)
Alkaline Phosphatase: 72 U/L (ref 38–126)
Anion gap: 7 (ref 5–15)
BUN: 46 mg/dL — ABNORMAL HIGH (ref 8–23)
CO2: 25 mmol/L (ref 22–32)
Calcium: 7.6 mg/dL — ABNORMAL LOW (ref 8.9–10.3)
Chloride: 108 mmol/L (ref 98–111)
Creatinine, Ser: 2.9 mg/dL — ABNORMAL HIGH (ref 0.61–1.24)
GFR, Estimated: 23 mL/min — ABNORMAL LOW (ref >60)
Glucose, Bld: 84 mg/dL (ref 70–99)
Potassium: 3.9 mmol/L (ref 3.5–5.1)
Sodium: 140 mmol/L (ref 135–145)
Total Bilirubin: 1 mg/dL (ref 0.3–1.2)
Total Protein: 6.6 g/dL (ref 6.5–8.1)

## 2021-02-16 LAB — CULTURE, BLOOD (ROUTINE X 2)
Culture: NO GROWTH
Culture: NO GROWTH
Special Requests: ADEQUATE

## 2021-02-16 LAB — AMMONIA: Ammonia: 33 umol/L (ref 9–35)

## 2021-02-16 LAB — CBC
HCT: 24.7 % — ABNORMAL LOW (ref 39.0–52.0)
Hemoglobin: 7.4 g/dL — ABNORMAL LOW (ref 13.0–17.0)
MCH: 28.7 pg (ref 26.0–34.0)
MCHC: 30 g/dL (ref 30.0–36.0)
MCV: 95.7 fL (ref 80.0–100.0)
Platelets: 92 10*3/uL — ABNORMAL LOW (ref 150–400)
RBC: 2.58 MIL/uL — ABNORMAL LOW (ref 4.22–5.81)
RDW: 17.9 % — ABNORMAL HIGH (ref 11.5–15.5)
WBC: 6.1 10*3/uL (ref 4.0–10.5)
nRBC: 0 % (ref 0.0–0.2)

## 2021-02-16 LAB — MAGNESIUM: Magnesium: 1.8 mg/dL (ref 1.7–2.4)

## 2021-02-16 MED ORDER — KETOROLAC TROMETHAMINE 15 MG/ML IJ SOLN
15.0000 mg | Freq: Once | INTRAMUSCULAR | Status: AC
  Administered 2021-02-16: 15 mg via INTRAVENOUS
  Filled 2021-02-16: qty 1

## 2021-02-16 NOTE — Progress Notes (Signed)
Patient has stayed off BiPAP tonight. He is still confused,

## 2021-02-16 NOTE — Progress Notes (Signed)
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   In the emergency department, patient was notably tachycardic, febrile with a temperature of 101.0 F, BP 118/39, SPO2 97% on 2 L nasal cannula.  Lab work-up notable for leukocytosis, normocytic anemia, creatinine 2.63, influenza A/B and COVID-19 PCR negative.  Chest x-ray with cardiomegaly with findings suggestive of CHF/pulmonary edema. CT chest without contrast with marked severity cellulitis along the lateral aspect of the mid and lower chest wall, marked severity splenomegaly.  Patient was started on IV Azactam, Tylenol given due to fever and started on IV fluid hydration. TRH consulted for further evaluation and management of sepsis.    Assessment & Plan:   Principal Problem:   Cellulitis of chest wall Active Problems:   Obstructive sleep apnea   Chronic low back pain   Morbid obesity with BMI of 60.0-69.9, adult (HCC)   Multiple falls   Elevated brain natriuretic peptide (BNP) level   Pulmonary edema   Splenomegaly, not elsewhere classified   Ascites   Chronic venous stasis dermatitis of both lower extremities   Chronic kidney disease   Acute febrile illness   Acute respiratory failure with hypoxia (HCC)   Cellulitis of scrotum   Bacteremia due to Pseudomonas   Acute dyspnea   Acute metabolic encephalopathy-worsening Hypoxemia, hypercapnia Recently noted to be more confused and encephalopathic on 10/8 Recent abg suggests hypercarbia, pt is continued  on bipap as tolerated This AM, pt is awake, conversant, somewhat confused -Cont with bipap as tolerated -Strongly recommend outpt sleep study if not done already -Noted to have ongoing cognitive decline.   Pseudomonas septicemia Patient during hospitalization continued with intermittent fevers.   -Pt was initially continued with PICC line for outpatient antibiotics for recent right prosthetic knee infection. PICC line has since been removed on 02/10/2021 given concerns for line infection --Infectious disease following, appreciate assistance --Continued on cefepime 2g IV q12h, started 02/08/2021 --Repeat blood cultures x2 10/5: No growth x5 days --Cont course of abx for now -Further recommendations from ID pending   Right knee prostatic joint infection Patient developed MSSE infection of right knee prosthesis, evaluated at [facility].  Discharged on long-term antibiotics for 6 weeks to end 02/25/2021 via PICC line with vancomycin and rifampin. --was initially continued on daptomycin at [facility], since later being discontinued by ID --Continue rifampin 300 mg p.o. twice daily --Outpatient follow-up with [facility]   Acute renal failure on CKD stage IV Baseline creatinine 1.8-2.5.  Received IV fluid hydration. 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  Appears stable and at baseline. -- Hold furosemide with poor oral intake -- Strict I's and O's and daily weights -Recent ammonia levels are stable   Chronic venous stasis dermatitis bilateral lower extremities, chronic --Supportive care --Lower extremity elevation --Furosemide as above   Essential hypertension -- Hold furosemide  with poor oral intake   BPH: Finasteride 5 mg  p.o. daily, tamsulosin 0.4 mg nightly   Chest wall cellulitis: Resolved Scrotal cellulitis: Resolved   Morbid obesity Body mass index is 64.62 kg/m.  Discussed with patient needs for aggressive lifestyle changes/weight loss as this complicates all facets of care.  Outpatient follow-up with PCP.  May benefit from bariatric evaluation outpatient. Recommend sleep study if not done already   Weakness/deconditioning/debility: From [facility].  Seen by PT/OT recommending return to SNF. --Pending insurance authorization for [employer], [place]   DVT prophylaxis: SCD's Code Status: Full Family Communication: Discussed with wife on phone 10/10   Status is: Inpatient   Remains inpatient appropriate because:Inpatient level of care appropriate due to severity of illness   Dispo: The patient is from: SNF              Anticipated d/c is to: SNF              Patient currently is not medically stable to d/c.              Difficult to place patient No     Consultants:  ID   Procedures:   See below  Antimicrobials:  Anti-infectives (From admission, onward)    Start     Dose/Rate Route Frequency Ordered Stop   02/11/21 2200  rifampin (RIFADIN) capsule 300 mg        300 mg Oral Every 12 hours 02/11/21 0925     02/08/21 2000  ceFEPIme (MAXIPIME) 2 g in sodium chloride 0.9 % 100 mL IVPB        2 g 200 mL/hr over 30 Minutes Intravenous Every 12 hours 02/08/21 1302     02/08/21 1200  piperacillin-tazobactam (ZOSYN) IVPB 3.375 g  Status:  Discontinued        3.375 g 12.5 mL/hr over 240 Minutes Intravenous Every 8 hours 02/08/21 0558 02/08/21 1302   02/08/21 0600  piperacillin-tazobactam (ZOSYN) IVPB 3.375 g        3.375 g 100 mL/hr over 30 Minutes Intravenous STAT 02/08/21 0558 02/08/21 0716   02/03/21 2000  DAPTOmycin (CUBICIN) 900 mg in sodium chloride 0.9 % IVPB  Status:  Discontinued        900 mg 136 mL/hr over 30 Minutes Intravenous Daily 02/03/21 1516 02/11/21 0925    02/02/21 1410  vancomycin variable dose per unstable renal function (pharmacist dosing)  Status:  Discontinued         Does not apply See admin instructions 02/02/21 1410 02/03/21 1516   02/02/21 1400  rifampin (RIFADIN) capsule 300 mg  Status:  Discontinued        300 mg Oral 3 times daily 02/02/21 1106 02/11/21 0925   02/02/21 1330  amoxicillin-clavulanate (AUGMENTIN) 875-125 MG per tablet 1 tablet        1 tablet Oral Every 12 hours 02/02/21 1238 02/06/21 2026   02/02/21 1000  rifampin (RIFADIN) capsule 450 mg  Status:  Discontinued        450 mg Oral Every 12 hours 02/02/21 0701 02/02/21 1106   02/02/21 0830  vancomycin (VANCOREADY) IVPB 2000 mg/400 mL        2,000 mg 200 mL/hr over 120 Minutes Intravenous  Once 02/02/21 0803 02/02/21 1547   01/31/21 0330  ceFEPIme (MAXIPIME) 2 g in sodium chloride 0.9 % 100 mL IVPB  Status:  Discontinued        2 g 200 mL/hr over 30 Minutes Intravenous Every 12 hours  01/28/2021 2214 02/02/21 0701   01/31/21 0030  linezolid (ZYVOX) tablet 600 mg  Status:  Discontinued        600 mg Oral Every 12 hours 01/17/2021 2344 02/02/21 0701   01/22/2021 2330  vancomycin (VANCOREADY) IVPB 1500 mg/300 mL  Status:  Discontinued       See Hyperspace for full Linked Orders Report.   1,500 mg 150 mL/hr over 120 Minutes Intravenous  Once 01/08/2021 2148 01/17/2021 2151   01/25/2021 2230  vancomycin (VANCOCIN) IVPB 1000 mg/200 mL premix  Status:  Discontinued       See Hyperspace for full Linked Orders Report.   1,000 mg 200 mL/hr over 60 Minutes Intravenous  Once 01/14/2021 2148 01/12/2021 2151   01/24/2021 2145  vancomycin (VANCOCIN) IVPB 1000 mg/200 mL premix  Status:  Discontinued        1,000 mg 200 mL/hr over 60 Minutes Intravenous  Once 01/25/2021 2140 01/13/2021 2145   01/31/2021 2145  ceFEPIme (MAXIPIME) 2 g in sodium chloride 0.9 % 100 mL IVPB  Status:  Discontinued        2 g 200 mL/hr over 30 Minutes Intravenous  Once 01/11/2021 2140 02/05/2021 2212   02/01/2021 1830  aztreonam  (AZACTAM) 2 g in sodium chloride 0.9 % 100 mL IVPB        2 g 200 mL/hr over 30 Minutes Intravenous  Once 02/03/2021 1821 02/06/2021 2034       Subjective: Patient seen and evaluated today and appears to have ongoing confusion noted.  Remained on nasal cannula overnight.  Objective: Vitals:   02/16/21 0930 02/16/21 1000 02/16/21 1030 02/16/21 1100  BP: (!) 128/58 (!) 91/39 (!) 107/39   Pulse: 91 93 90   Resp: '16 13 14   '$ Temp:    98.4 F (36.9 C)  TempSrc:    Axillary  SpO2: 99% 96% 97%   Weight:      Height:        Intake/Output Summary (Last 24 hours) at 02/16/2021 1322 Last data filed at 02/16/2021 1000 Gross per 24 hour  Intake 1053.81 ml  Output 1500 ml  Net -446.19 ml   Filed Weights   02/14/21 0403 02/15/21 0500 02/16/21 0500  Weight: (!) 179.9 kg (!) 179.1 kg (!) 178.1 kg    Examination:  General exam: Appears somnolent, but arousable Respiratory system: Clear to auscultation. Respiratory effort normal.  Currently on nasal cannula, obese Cardiovascular system: S1 & S2 heard, RRR.  Gastrointestinal system: Abdomen is soft Central nervous system: Somnolent but arousable Extremities: No edema Skin: No significant lesions noted Psychiatry: Flat affect.    Data Reviewed: I have personally reviewed following labs and imaging studies  CBC: Recent Labs  Lab 02/11/21 0523 02/13/21 0518 02/14/21 0524 02/15/21 0519 02/16/21 0429 02/16/21 1217  WBC 5.7 6.0 5.5 6.5 6.1  --   HGB 8.5* 8.0* 8.4* 8.5* 7.4* 8.1*  HCT 27.5* 26.1* 28.2* 27.9* 24.7* 27.0*  MCV 94.2 93.9 95.6 94.3 95.7  --   PLT 95* 96* 94* 107* 92*  --    Basic Metabolic Panel: Recent Labs  Lab 02/10/21 0534 02/11/21 0523 02/12/21 0959 02/13/21 0518 02/14/21 0524 02/15/21 0519 02/16/21 0429  NA 134* 137 136 139 139 139 140  K 4.0 3.9 3.7 3.8 4.0 3.9 3.9  CL 103 104 105 108 107 109 108  CO2 '25 25 25 25 26 24 25  '$ GLUCOSE 132* 114* 133* 112* 109* 100* 84  BUN 36* 36* 35* 38* 39* 41* 46*  CREATININE 2.86* 2.78* 2.60* 2.56* 2.51* 2.63* 2.90*  CALCIUM 7.6* 7.7* 7.7* 7.7* 7.9* 7.9* 7.6*  MG 1.9 1.8  --  1.9 1.9  --  1.8   GFR: Estimated Creatinine Clearance: 39.9 mL/min (A) (by C-G formula based on SCr of 2.9 mg/dL (H)). Liver Function Tests: Recent Labs  Lab 02/14/21 0524 02/15/21 0519 02/16/21 0429  AST '20 22 24  '$ ALT '15 17 16  '$ ALKPHOS 106 93 72  BILITOT 1.0 1.1 1.0  PROT 7.0 7.2 6.6  ALBUMIN 1.8* 1.9* 1.8*   No results for input(s): LIPASE, AMYLASE in the last 168 hours. Recent Labs  Lab 02/13/21 0846 02/13/21 0900 02/15/21 0818 02/16/21 0429  AMMONIA 41* 21 42* 33   Coagulation Profile: No results for input(s): INR, PROTIME in the last 168 hours. Cardiac Enzymes: Recent Labs  Lab 02/10/21 0534  CKTOTAL 17*   BNP (last 3 results) No results for input(s): PROBNP in the last 8760 hours. HbA1C: No results for input(s): HGBA1C in the last 72 hours. CBG: Recent Labs  Lab 02/15/21 1900  GLUCAP 83   Lipid Profile: No results for input(s): CHOL, HDL, LDLCALC, TRIG, CHOLHDL, LDLDIRECT in the last 72 hours. Thyroid Function Tests: No results for input(s): TSH, T4TOTAL, FREET4, T3FREE, THYROIDAB in the last 72 hours. Anemia Panel: No results for input(s): VITAMINB12, FOLATE, FERRITIN, TIBC, IRON, RETICCTPCT in the last 72 hours. Sepsis Labs: No results for input(s): PROCALCITON, LATICACIDVEN in the last 168 hours.  Recent Results (from the past 240 hour(s))  Culture, blood (routine x 2)     Status: None   Collection Time: 02/11/21 10:02 AM   Specimen: Right Antecubital; Blood  Result Value Ref Range Status   Specimen Description RIGHT ANTECUBITAL  Final   Special Requests   Final    BOTTLES DRAWN AEROBIC AND ANAEROBIC Blood Culture results may not be optimal due to an excessive volume of blood received in culture bottles   Culture   Final    NO GROWTH 5 DAYS Performed at [facility], [address] [place], [place] [address]    Report Status  02/16/2021 FINAL  Final  Culture, blood (routine x 2)     Status: None   Collection Time: 02/11/21 10:02 AM   Specimen: BLOOD RIGHT ARM  Result Value Ref Range Status   Specimen Description BLOOD RIGHT ARM  Final   Special Requests   Final    BOTTLES DRAWN AEROBIC AND ANAEROBIC Blood Culture adequate volume   Culture   Final    NO GROWTH 5 DAYS Performed at [facility], [address] [place], [place] [address]    Report Status 02/16/2021 FINAL  Final  Blood culture (routine single)     Status: None (Preliminary result)   Collection Time: 02/12/21  9:59 AM   Specimen: Right Antecubital; Blood  Result Value Ref Range Status   Specimen Description RIGHT ANTECUBITAL  Final   Special Requests   Final    BOTTLES DRAWN AEROBIC AND ANAEROBIC Blood Culture adequate volume   Culture   Final    NO GROWTH 4 DAYS Performed at [facility], [address] [place], [place] [address]    Report Status PENDING  Incomplete         Radiology Studies: DG CHEST PORT 1 VIEW  Result Date: 02/15/2021 CLINICAL DATA:  Acute hypoxemic respiratory failure EXAM: PORTABLE CHEST 1 VIEW COMPARISON:  02/14/2021 FINDINGS: Cardiomegaly with vascular congestion. Diffuse interstitial opacities compatible with edema. No effusions or acute bony  abnormality. IMPRESSION: Cardiomegaly, vascular congestion, mild interstitial edema. Electronically Signed   By: Rolm Baptise M.D.   On: 02/15/2021 17:09        Scheduled Meds:  acetaminophen  650 mg Oral Q6H   Or   acetaminophen  650 mg Rectal Q6H   Chlorhexidine Gluconate Cloth  6 each Topical Daily   finasteride  5 mg Oral Daily   fluticasone  1 spray Each Nare Daily   folic acid  1 mg Oral Daily   furosemide  40 mg Intravenous Q12H   nystatin   Topical BID   rifampin  300 mg Oral Q12H   saccharomyces boulardii  250 mg Oral BID   sodium chloride flush  3 mL Intravenous Q8H   tamsulosin  0.4 mg Oral QHS   topiramate  100 mg Oral Daily   Continuous  Infusions:  sodium chloride Stopped (02/16/21 0006)   ceFEPime (MAXIPIME) IV 2 g (02/16/21 [id number])   norepinephrine (LEVOPHED) Adult infusion Stopped (02/16/21 0006)     LOS: 17 days    Time spent: 35 minutes    Ashlie Mcmenamy Darleen Crocker, DO [facility]  If 7PM-7AM, please contact night-coverage [email or web address] 02/16/2021, 1:22 PM

## 2021-02-16 NOTE — Progress Notes (Signed)
Patient remains altered, opens eyes to voice but not purposeful. Will occasionally yell out "oh shit" and "dammit" and when asked "what is wrong?" By this RN, patient does not respond-but stares with flat affect. Rectal tube and condom cath remain on, repositioning every 2 hours. Oral care performed. Patient bp with MAPs >65, not started on levophed at this time.

## 2021-02-17 ENCOUNTER — Inpatient Hospital Stay: Payer: Self-pay

## 2021-02-17 ENCOUNTER — Inpatient Hospital Stay (HOSPITAL_COMMUNITY)
Admit: 2021-02-17 | Discharge: 2021-02-17 | Disposition: A | Discharge status: Home / Self Care | Payer: Medicare HMO | Attending: Internal Medicine | Admitting: Internal Medicine

## 2021-02-17 ENCOUNTER — Inpatient Hospital Stay (HOSPITAL_COMMUNITY): Payer: Medicare HMO

## 2021-02-17 DIAGNOSIS — R4182 Altered mental status, unspecified: Secondary | ICD-10-CM | POA: Diagnosis not present

## 2021-02-17 DIAGNOSIS — L03313 Cellulitis of chest wall: Secondary | ICD-10-CM | POA: Diagnosis not present

## 2021-02-17 LAB — COMPREHENSIVE METABOLIC PANEL
ALT: 18 U/L (ref 0–44)
AST: 26 U/L (ref 15–41)
Albumin: 2.1 g/dL — ABNORMAL LOW (ref 3.5–5.0)
Alkaline Phosphatase: 72 U/L (ref 38–126)
Anion gap: 7 (ref 5–15)
BUN: 48 mg/dL — ABNORMAL HIGH (ref 8–23)
CO2: 24 mmol/L (ref 22–32)
Calcium: 7.7 mg/dL — ABNORMAL LOW (ref 8.9–10.3)
Chloride: 111 mmol/L (ref 98–111)
Creatinine, Ser: 3.12 mg/dL — ABNORMAL HIGH (ref 0.61–1.24)
GFR, Estimated: 21 mL/min — ABNORMAL LOW (ref >60)
Glucose, Bld: 92 mg/dL (ref 70–99)
Potassium: 4 mmol/L (ref 3.5–5.1)
Sodium: 142 mmol/L (ref 135–145)
Total Bilirubin: 1.1 mg/dL (ref 0.3–1.2)
Total Protein: 7.4 g/dL (ref 6.5–8.1)

## 2021-02-17 LAB — CBC
HCT: 29.2 % — ABNORMAL LOW (ref 39.0–52.0)
Hemoglobin: 8.8 g/dL — ABNORMAL LOW (ref 13.0–17.0)
MCH: 28.7 pg (ref 26.0–34.0)
MCHC: 30.1 g/dL (ref 30.0–36.0)
MCV: 95.1 fL (ref 80.0–100.0)
Platelets: 143 10*3/uL — ABNORMAL LOW (ref 150–400)
RBC: 3.07 MIL/uL — ABNORMAL LOW (ref 4.22–5.81)
RDW: 18.3 % — ABNORMAL HIGH (ref 11.5–15.5)
WBC: 9.3 10*3/uL (ref 4.0–10.5)
nRBC: 0 % (ref 0.0–0.2)

## 2021-02-17 LAB — CULTURE, BLOOD (SINGLE)
Culture: NO GROWTH
Special Requests: ADEQUATE

## 2021-02-17 LAB — GLUCOSE, CAPILLARY: Glucose-Capillary: 95 mg/dL (ref 70–99)

## 2021-02-17 LAB — MAGNESIUM: Magnesium: 1.9 mg/dL (ref 1.7–2.4)

## 2021-02-17 LAB — AMMONIA
Ammonia: 24 umol/L (ref 9–35)
Ammonia: 50 umol/L — ABNORMAL HIGH (ref 9–35)

## 2021-02-17 MED ORDER — ALBUMIN HUMAN 25 % IV SOLN
12.5000 g | Freq: Once | INTRAVENOUS | Status: AC
  Administered 2021-02-17: 12.5 g via INTRAVENOUS
  Filled 2021-02-17: qty 50

## 2021-02-17 MED ORDER — LACTATED RINGERS IV SOLN: INTRAVENOUS | Status: DC

## 2021-02-17 MED ORDER — LACTULOSE ENEMA
300.0000 mL | Freq: Once | ORAL | Status: AC
  Administered 2021-02-18: 300 mL via RECTAL
  Filled 2021-02-17: qty 300

## 2021-02-17 MED ORDER — SODIUM CHLORIDE 0.9% FLUSH: 10.0000 mL | INTRAVENOUS | Status: DC | PRN

## 2021-02-17 MED ORDER — SODIUM CHLORIDE 0.9% FLUSH
10.0000 mL | Freq: Two times a day (BID) | INTRAVENOUS | Status: DC
  Administered 2021-02-17 – 2021-02-19 (×4): 10 mL

## 2021-02-17 NOTE — Progress Notes (Signed)
EEG complete - results pending 

## 2021-02-17 NOTE — Progress Notes (Signed)
Peripherally Inserted Central Catheter Placement  The IV Nurse has discussed with the patient and/or persons authorized to consent for the patient, the purpose of this procedure and the potential benefits and risks involved with this procedure.  The benefits include less needle sticks, lab draws from the catheter, and the patient may be discharged home with the catheter. Risks include, but not limited to, infection, bleeding, blood clot (thrombus formation), and puncture of an artery; nerve damage and irregular heartbeat and possibility to perform a PICC exchange if needed/ordered by physician.  Alternatives to this procedure were also discussed.  Bard Power PICC patient education guide, fact sheet on infection prevention and patient information card has been provided to patient /or left at bedside. Consent obtained from wife at bedside.   PICC Placement Documentation  PICC Double Lumen [id number] PICC Left Basilic 47 cm 0 cm (Active)  Indication for Insertion or Continuance of Line Administration of hyperosmolar/irritating solutions (i.e. TPN, Vancomycin, etc.);Vasoactive infusions 02/17/21 1455  Exposed Catheter (cm) 0 cm 02/17/21 1455  Site Assessment Clean;Dry;Intact 02/17/21 1455  Lumen #1 Status Flushed;Saline locked;Blood return noted 02/17/21 1455  Lumen #2 Status Flushed;Saline locked;Blood return noted 02/17/21 1455  Dressing Type Transparent;Securing device 02/17/21 1455  Dressing Status Clean;Dry;Intact 02/17/21 1455  Antimicrobial disc in place? Yes 02/17/21 [place] Not Applicable [id number]  Line Care Connections checked and tightened 02/17/21 1455  Dressing Intervention New dressing 02/17/21 1455  Dressing Change Due 02/24/21 02/17/21 [place] 02/17/2021, 3:01 PM

## 2021-02-17 NOTE — Progress Notes (Signed)
**Note De-Identified  Obfuscation** RT note: AM assessment, patient sleeping and resting comfortable on BIPAP at this time. VS WNL.  RT to continue to monitor.

## 2021-02-17 NOTE — Progress Notes (Signed)
OT Cancellation Note  Patient Details Name: Eddie Fletcher MRN: [id number] DOB: Sep 07, 1955   Cancelled Treatment:    Reason Eval/Treat Not Completed: Medical issues which prohibited therapy. Patient transferred to a higher level of care and will need new OT consult resume therapy when patient is medically stable.  Thank you.  Caidyn Blossom OT, MOT   Larey Seat 02/17/2021, 8:11 AM

## 2021-02-17 NOTE — Procedures (Addendum)
Patient Name: Eddie Fletcher  MRN: [id number]  Epilepsy Attending: Lora Havens  Referring Physician/Provider: Dr Heath Lark Date: 02/17/2021 Duration: 29.23 mins  Patient history: 65yo M with ams. EEG to evaluate for seizure  Level of alertness: lethargic   AEDs during EEG study: TPM  Technical aspects: This EEG study was done with scalp electrodes positioned according to the 10-20 International system of electrode placement. Electrical activity was acquired at a sampling rate of '500Hz'$  and reviewed with a high frequency filter of '70Hz'$  and a low frequency filter of '1Hz'$ . EEG data were recorded continuously and digitally stored.   Description: EEG showed continuous generalized 2-'3Hz'$  delta slowing. Generalized periodic discharges with triphasic morphology at 2-2.5 Hz were also noted. Hyperventilation and photic stimulation were not performed.     ABNORMALITY - Periodic discharges with triphasic morphology, generalized ( GPDs) - Continuous slow, generalized  IMPRESSION: This study showed generalized periodic discharges with triphasic morphology at 2-2.5 Hz which is on the ictal-interictal continuum with low to intermediate potential for seizures. This eeg pattern can also bee seen due to toxic-metabolic causes like hyperammonemia, renal failure, cefepime toxicity. Additionally there is moderate to severe diffuse encephalopathy, nonspecific etiology. No definite seizures were seen throughout the recording.  Dr Manuella Ghazi was notified.  Lavena Loretto Barbra Sarks

## 2021-02-17 NOTE — Progress Notes (Signed)
Palliative:  I spoke today with wife, Elmo Putt. She is not present in hospital at this time and unable to speak currently. She is taking some time to digest information from today. She plans to be present tomorrow and we plan to meet 02/18/21 ~1200 noon. She will be with Mr. Reim sister and possibly with their daughter as well. Thank you for this consult.   No charge  Vinie Sill, NP Palliative Medicine Team Pager [phone] (Please see [email or web address] for schedule) Team Phone [phone]

## 2021-02-17 NOTE — Progress Notes (Signed)
Is OK to place PICC from nephrology perspective   Eddie Fletcher

## 2021-02-17 NOTE — Progress Notes (Signed)
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   In the emergency department, patient was notably tachycardic, febrile with a temperature of 101.0 F, BP 118/39, SPO2 97% on 2 L nasal cannula.  Lab work-up notable for leukocytosis, normocytic anemia, creatinine 2.63, influenza A/B and COVID-19 PCR negative.  Chest x-ray with cardiomegaly with findings suggestive of CHF/pulmonary edema. CT chest without contrast with marked severity cellulitis along the lateral aspect of the mid and lower chest wall, marked severity splenomegaly.  Patient was started on IV Azactam, Tylenol given due to fever and started on IV fluid hydration. TRH consulted for further evaluation and management of sepsis.    -Patient has continued to have significant clinical deterioration with altered mentation as well as need for pressors.  He will require PICC line placement on 10/11.  Palliative has been consulted for further goals of care discussion as it does not appear that he may make it through this hospitalization.  CT head does demonstrate findings of possible acute CVA to right temporal lobe, but he does not appear to be stable for MRI.  EEG ordered to evaluate for any seizure activity and is currently pending.  Neurology consulted for further evaluation.   Assessment & Plan:   Principal Problem:   Cellulitis of chest wall Active Problems:   Obstructive sleep apnea   Chronic low back pain   Morbid obesity with BMI of 60.0-69.9, adult (HCC)    Multiple falls   Elevated brain natriuretic peptide (BNP) level   Pulmonary edema   Splenomegaly, not elsewhere classified   Ascites   Chronic venous stasis dermatitis of both lower extremities   Chronic kidney disease   Acute febrile illness   Acute respiratory failure with hypoxia (HCC)   Cellulitis of scrotum   Bacteremia due to Pseudomonas   Acute dyspnea   Acute metabolic encephalopathy-worsening Hypoxemia, hypercapnia Recently noted to be more confused and encephalopathic on 10/8 Recent abg suggests hypercarbia, pt is continued on bipap as tolerated This AM, pt is awake, conversant, somewhat confused -Cont with bipap as tolerated -Strongly recommend outpt sleep study if not done already -Noted to have ongoing cognitive decline with CT head now showing right temporal lobe possible CVA.  EEG ordered for further evaluation.  Appreciate neurology evaluation.   Pseudomonas septicemia Patient during hospitalization continued with intermittent fevers.   -Pt was initially continued with PICC line for outpatient antibiotics for recent right prosthetic knee infection. PICC line has since been removed on 02/10/2021 given concerns for line infection --Infectious disease following, appreciate assistance --Continued on cefepime 2g IV q12h, started 02/08/2021 --Repeat blood cultures x2 10/5: No growth x5 days --Cont course of abx for now for MSSE -ID recommendations as noted on note from 10/5 with plans to continue rifampin through 10/19 as well as cefepime.  Circulatory shock -Will need PICC line placed 10/11, no further growth on blood cultures noted -Continue norepinephrine   Right knee prostatic joint infection Patient developed MSSE infection of right knee prosthesis, evaluated at [place].  Discharged on long-term antibiotics for 6  weeks to end 02/25/2021 via PICC line with vancomycin and rifampin. --was initially continued on daptomycin at [facility], since later being  discontinued by ID --Continue rifampin 300 mg p.o. twice daily --Outpatient follow-up with [facility], ID recommendations as noted above   Acute renal failure on CKD stage IV Baseline creatinine 1.8-2.5.   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  Appears stable and at baseline. -- Hold furosemide with poor oral intake -- Strict I's and O's and daily weights -Recent ammonia levels are stable, recheck in a.m.   Chronic venous stasis dermatitis bilateral lower extremities, chronic --Supportive care --Lower extremity elevation --Furosemide currently held   Essential hypertension-currently hypotensive -- Hold furosemide with poor oral intake -Start IV fluid 10/11   BPH: Finasteride 5 mg p.o. daily, tamsulosin 0.4 mg nightly   Chest wall cellulitis: Resolved Scrotal cellulitis: Resolved   Morbid obesity Body mass index is 64.62 kg/m.  Discussed with patient needs for aggressive lifestyle changes/weight loss as this complicates all facets of care.  Outpatient follow-up with PCP.  May benefit from bariatric evaluation outpatient. Recommend sleep study if not done already   Weakness/deconditioning/debility: From [facility].  Seen by PT/OT recommending return to SNF. --Pending insurance authorization for [employer], [place]   DVT prophylaxis: SCD's Code Status: Full Family Communication: Discussed with wife and sister bedside 10/11   Status is: Inpatient   Remains inpatient appropriate because:Inpatient level of care appropriate due to severity of illness   Dispo: The patient is from: SNF              Anticipated d/c is to: SNF vs hospice  with grave prognosis              Patient currently is not medically stable to d/c.              Difficult to place patient No     Consultants:  ID   Procedures:   See below  Antimicrobials:  Anti-infectives (From admission, onward)    Start     Dose/Rate Route Frequency Ordered Stop   02/11/21 2200  rifampin (RIFADIN) capsule 300 mg        300 mg Oral Every 12 hours 02/11/21 0925     02/08/21 2000  ceFEPIme (MAXIPIME) 2 g in sodium chloride 0.9 % 100 mL IVPB        2 g 200 mL/hr over 30 Minutes Intravenous Every 12 hours 02/08/21 1302     02/08/21 1200  piperacillin-tazobactam (ZOSYN) IVPB 3.375 g  Status:  Discontinued        3.375 g 12.5 mL/hr over 240 Minutes Intravenous Every 8 hours 02/08/21 0558 02/08/21 1302   02/08/21 0600  piperacillin-tazobactam (ZOSYN) IVPB 3.375 g        3.375 g 100 mL/hr over 30 Minutes Intravenous STAT 02/08/21 0558 02/08/21 0716   02/03/21 2000  DAPTOmycin (CUBICIN) 900 mg in sodium chloride 0.9 % IVPB  Status:  Discontinued        900 mg 136 mL/hr over 30 Minutes Intravenous Daily 02/03/21 1516 02/11/21 0925   02/02/21 1410  vancomycin variable dose per unstable renal function (pharmacist dosing)  Status:  Discontinued  Does not apply See admin instructions 02/02/21 1410 02/03/21 1516   02/02/21 1400  rifampin (RIFADIN) capsule 300 mg  Status:  Discontinued        300 mg Oral 3 times daily 02/02/21 1106 02/11/21 0925   02/02/21 1330  amoxicillin-clavulanate (AUGMENTIN) 875-125 MG per tablet 1 tablet        1 tablet Oral Every 12 hours 02/02/21 1238 02/06/21 2026   02/02/21 1000  rifampin (RIFADIN) capsule 450 mg  Status:  Discontinued        450 mg Oral Every 12 hours 02/02/21 0701 02/02/21 1106   02/02/21 0830  vancomycin (VANCOREADY) IVPB 2000 mg/400 mL        2,000 mg 200 mL/hr over 120 Minutes Intravenous  Once 02/02/21 0803 02/02/21 1547   01/31/21 0330  ceFEPIme (MAXIPIME) 2 g in sodium chloride 0.9 % 100 mL IVPB  Status:   Discontinued        2 g 200 mL/hr over 30 Minutes Intravenous Every 12 hours 01/23/2021 2214 02/02/21 0701   01/31/21 0030  linezolid (ZYVOX) tablet 600 mg  Status:  Discontinued        600 mg Oral Every 12 hours 01/31/2021 2344 02/02/21 0701   01/25/2021 2330  vancomycin (VANCOREADY) IVPB 1500 mg/300 mL  Status:  Discontinued       See Hyperspace for full Linked Orders Report.   1,500 mg 150 mL/hr over 120 Minutes Intravenous  Once 01/08/2021 2148 01/26/2021 2151   02/01/2021 2230  vancomycin (VANCOCIN) IVPB 1000 mg/200 mL premix  Status:  Discontinued       See Hyperspace for full Linked Orders Report.   1,000 mg 200 mL/hr over 60 Minutes Intravenous  Once 01/09/2021 2148 01/23/2021 2151   01/14/2021 2145  vancomycin (VANCOCIN) IVPB 1000 mg/200 mL premix  Status:  Discontinued        1,000 mg 200 mL/hr over 60 Minutes Intravenous  Once 02/03/2021 2140 02/04/2021 2145   01/09/2021 2145  ceFEPIme (MAXIPIME) 2 g in sodium chloride 0.9 % 100 mL IVPB  Status:  Discontinued        2 g 200 mL/hr over 30 Minutes Intravenous  Once 01/19/2021 2140 01/25/2021 2212   01/31/2021 1830  aztreonam (AZACTAM) 2 g in sodium chloride 0.9 % 100 mL IVPB        2 g 200 mL/hr over 30 Minutes Intravenous  Once 01/29/2021 1821 01/08/2021 2034      Subjective: Patient seen and evaluated today and is currently unresponsive to questioning.  Objective: Vitals:   02/17/21 0800 02/17/21 0900 02/17/21 1000 02/17/21 1100  BP: (!) 132/44 (!) 129/45 (!) 115/38 (!) 120/41  Pulse: 76 80 82 81  Resp: '12 12 13 15  '$ Temp: (!) 96.5 F (35.8 C)     TempSrc: Axillary     SpO2: 99% 99% 99% 99%  Weight:      Height:        Intake/Output Summary (Last 24 hours) at 02/17/2021 1313 Last data filed at 02/17/2021 1100 Gross per 24 hour  Intake 550.2 ml  Output 650 ml  Net -99.8 ml   Filed Weights   02/15/21 0500 02/16/21 0500 02/17/21 0500  Weight: (!) 179.1 kg (!) 178.1 kg (!) 178.9 kg    Examination:  General exam: Unresponsive, morbidly  obese Respiratory system: Clear to auscultation. Respiratory effort normal.  Currently on BiPAP FiO2 30% Cardiovascular system: S1 & S2 heard, RRR.  Gastrointestinal system: Abdomen is soft Central nervous system: Somnolent and  unresponsive Extremities: Chronic edema Skin: No significant lesions noted Psychiatry: Cannot be assessed    Data Reviewed: I have personally reviewed following labs and imaging studies  CBC: Recent Labs  Lab 02/13/21 0518 02/14/21 0524 02/15/21 0519 02/16/21 0429 02/16/21 1217 02/17/21 0448  WBC 6.0 5.5 6.5 6.1  --  9.3  HGB 8.0* 8.4* 8.5* 7.4* 8.1* 8.8*  HCT 26.1* 28.2* 27.9* 24.7* 27.0* 29.2*  MCV 93.9 95.6 94.3 95.7  --  95.1  PLT 96* 94* 107* 92*  --  [id number]*   Basic Metabolic Panel: Recent Labs  Lab 02/11/21 0523 02/12/21 0959 02/13/21 0518 02/14/21 0524 02/15/21 0519 02/16/21 0429 02/17/21 0448  NA 137   < > 139 139 139 140 142  K 3.9   < > 3.8 4.0 3.9 3.9 4.0  CL 104   < > 108 107 109 108 111  CO2 25   < > '25 26 24 25 24  '$ GLUCOSE 114*   < > 112* 109* 100* 84 92  BUN 36*   < > 38* 39* 41* 46* 48*  CREATININE 2.78*   < > 2.56* 2.51* 2.63* 2.90* 3.12*  CALCIUM 7.7*   < > 7.7* 7.9* 7.9* 7.6* 7.7*  MG 1.8  --  1.9 1.9  --  1.8 1.9   < > = values in this interval not displayed.   GFR: Estimated Creatinine Clearance: 37.1 mL/min (A) (by C-G formula based on SCr of 3.12 mg/dL (H)). Liver Function Tests: Recent Labs  Lab 02/14/21 0524 02/15/21 0519 02/16/21 0429 02/17/21 0448  AST '20 22 24 26  '$ ALT '15 17 16 18  '$ ALKPHOS 106 93 72 72  BILITOT 1.0 1.1 1.0 1.1  PROT 7.0 7.2 6.6 7.4  ALBUMIN 1.8* 1.9* 1.8* 2.1*   No results for input(s): LIPASE, AMYLASE in the last 168 hours. Recent Labs  Lab 02/13/21 0846 02/13/21 0900 02/15/21 0818 02/16/21 0429 02/17/21 0448  AMMONIA 41* 21 42* 33 24   Coagulation Profile: No results for input(s): INR, PROTIME in the last 168 hours. Cardiac Enzymes: No results for input(s): CKTOTAL, CKMB,  CKMBINDEX, TROPONINI in the last 168 hours. BNP (last 3 results) No results for input(s): PROBNP in the last 8760 hours. HbA1C: No results for input(s): HGBA1C in the last 72 hours. CBG: Recent Labs  Lab 02/15/21 1900 02/17/21 1149  GLUCAP 83 95   Lipid Profile: No results for input(s): CHOL, HDL, LDLCALC, TRIG, CHOLHDL, LDLDIRECT in the last 72 hours. Thyroid Function Tests: No results for input(s): TSH, T4TOTAL, FREET4, T3FREE, THYROIDAB in the last 72 hours. Anemia Panel: No results for input(s): VITAMINB12, FOLATE, FERRITIN, TIBC, IRON, RETICCTPCT in the last 72 hours. Sepsis Labs: No results for input(s): PROCALCITON, LATICACIDVEN in the last 168 hours.  Recent Results (from the past 240 hour(s))  Culture, blood (routine x 2)     Status: None   Collection Time: 02/11/21 10:02 AM   Specimen: Right Antecubital; Blood  Result Value Ref Range Status   Specimen Description RIGHT ANTECUBITAL  Final   Special Requests   Final    BOTTLES DRAWN AEROBIC AND ANAEROBIC Blood Culture results may not be optimal due to an excessive volume of blood received in culture bottles   Culture   Final    NO GROWTH 5 DAYS Performed at [facility], [address] [place], [place] [address]    Report Status 02/16/2021 FINAL  Final  Culture, blood (routine x 2)     Status: None  Collection Time: 02/11/21 10:02 AM   Specimen: BLOOD RIGHT ARM  Result Value Ref Range Status   Specimen Description BLOOD RIGHT ARM  Final   Special Requests   Final    BOTTLES DRAWN AEROBIC AND ANAEROBIC Blood Culture adequate volume   Culture   Final    NO GROWTH 5 DAYS Performed at [facility], [address] [place], [place] [address]    Report Status 02/16/2021 FINAL  Final  Blood culture (routine single)     Status: None   Collection Time: 02/12/21  9:59 AM   Specimen: Right Antecubital; Blood  Result Value Ref Range Status   Specimen Description RIGHT ANTECUBITAL  Final   Special Requests   Final     BOTTLES DRAWN AEROBIC AND ANAEROBIC Blood Culture adequate volume   Culture   Final    NO GROWTH 5 DAYS Performed at [facility], [address] [place], [place] [address]    Report Status 02/17/2021 FINAL  Final         Radiology Studies: CT HEAD WO CONTRAST (5MM)  Result Date: 02/17/2021 CLINICAL DATA:  Mental status change EXAM: CT HEAD WITHOUT CONTRAST TECHNIQUE: Contiguous axial images were obtained from the base of the skull through the vertex without intravenous contrast. COMPARISON:  12/17/2020 FINDINGS: Brain: Hypodensity in the right anterior temporal lobe, which may be artifactual (series 3, image 11). No acute hemorrhage, hydrocephalus, extra-axial collection, mass, mass effect, or midline shift. Vascular: No hyperdense vessel or unexpected calcification. Skull: Normal. Negative for fracture or focal lesion. Sinuses/Orbits: No acute finding. Status post bilateral lens replacements. Other: Trace fluid in the mastoid air cells. IMPRESSION: Hypodensity in the right anterior temporal lobe, which may be artifactual but could indicate an acute infarct. If there is clinical concern for infarct, an MRI without contrast is recommended. These results were called by telephone at the time of interpretation on 02/17/2021 at 11:42 am to provider [facility] , who verbally acknowledged these results. Electronically Signed   By: Merilyn Baba M.D.   On: 02/17/2021 11:44   DG CHEST PORT 1 VIEW  Result Date: 02/15/2021 CLINICAL DATA:  Acute hypoxemic respiratory failure EXAM: PORTABLE CHEST 1 VIEW COMPARISON:  02/14/2021 FINDINGS: Cardiomegaly with vascular congestion. Diffuse interstitial opacities compatible with edema. No effusions or acute bony abnormality. IMPRESSION: Cardiomegaly, vascular congestion, mild interstitial edema. Electronically Signed   By: Rolm Baptise M.D.   On: 02/15/2021 17:09   [place] EKG SITE RITE  Result Date: 02/17/2021 If Site Rite image not attached, placement could not  be confirmed due to current cardiac rhythm.       Scheduled Meds:  acetaminophen  650 mg Oral Q6H   Or   acetaminophen  650 mg Rectal Q6H   Chlorhexidine Gluconate Cloth  6 each Topical Daily   finasteride  5 mg Oral Daily   fluticasone  1 spray Each Nare Daily   folic acid  1 mg Oral Daily   nystatin   Topical BID   rifampin  300 mg Oral Q12H   saccharomyces boulardii  250 mg Oral BID   sodium chloride flush  3 mL Intravenous Q8H   tamsulosin  0.4 mg Oral QHS   topiramate  100 mg Oral Daily   Continuous Infusions:  sodium chloride Stopped (02/16/21 0006)   ceFEPime (MAXIPIME) IV 2 g (02/17/21 0848)   lactated ringers     norepinephrine (LEVOPHED) Adult infusion 8 mcg/min (02/17/21 1004)     LOS: 18 days  Time spent: 35 minutes    Rolanda Campa Darleen Crocker, DO [facility]  If 7PM-7AM, please contact night-coverage [email or web address] 02/17/2021, 1:13 PM

## 2021-02-17 NOTE — Consult Note (Addendum)
[place] A. Merlene Laughter, MD     [email or web address]          Eddie Fletcher is an 65 y.o. male.   ASSESSMENT/PLAN: Multifactorial toxic metabolic encephalopathy: Prognosis is overall poor given his numerous comorbidities and multisystem organ failure. Possible abnormal CT finding involving the right temporal lobe by radiology although I frankly could not appreciate the abnormality. It is reasonable to repeat his imaging with MRI at some point in the future although I do not think that this needs to be done urgently. EEG findings consistent with underlying renal failure. No additional workup is suggested at this time.    Patient presents with increasing lethargy, some hypersomnia and stupor. His workup has been significant for multiple abnormalities including what appears to be sepsis, thrombocytopenia, renal failure and multisystem.  Patient continues to be unresponsive although is not intubated. The patient cannot a history. The nurses report that he sometimes movements and jerking of his extremities.   GENERAL:  This is a morbidly obese male who is unresponsive.  HEENT:  He has a lowers stocking neck. Neck is supple however.  ABDOMEN: soft  EXTREMITIES:  There is significant edema of the lower extremities with the some scaling and hyper pigmentation of distal portion of the legs bilaterally.  BACK: Normal  SKIN:  unremarkable no rashes noted    MENTAL STATUS: He lays in bed with eyes closed. There is no eye opening even with deep painful stimuli. The patient does not follow commands. The patient does nonspecific shivering/ jerking movements with deep painful stimuli of all 4 extremities simultaneously.  CRANIAL NERVES: Pupils are equal, round and reactive to light; extra ocular movements are full by OCR, there is no significant nystagmus; visual fields are full -  by direct threat; upper and lower facial muscles are normal in strength and symmetric, there is no  flattening of the nasolabial folds;  Coronary reflexes intact.  MOTOR:  Flicker - 1/5 to deep pain with some light involving all 4 extremities.  COORDINATION:  No clear myoclonus, tremors or parkinsonism noted.  REFLEXES: Deep tendon reflexes are symmetrical but diminished throughout.  SENSATION: Normal to pain.     Blood pressure (!) 120/41, pulse 81, temperature (!) 96.5 F (35.8 C), temperature source Axillary, resp. rate 15, height '5\' 6"'$  (1.676 m), weight (!) 178.9 kg, SpO2 99 %.  Past Medical History:  Diagnosis Date   Arthritis    Cardiomegaly    Cellulitis    Chest discomfort    15 years ago; had negative testing and told it was related to stress   Colon polyps    Complication of anesthesia    blood pressure - low post op   Diaphoresis    Edema    Leg   GERD (gastroesophageal reflux disease)    Hemorrhoids    History of kidney stones    Hypertension    Obesity    Seasonal allergies    Sleep apnea    cpap 3 yrs-not used for 3-4 months   SOB (shortness of breath)    occ with exersion   Wears glasses     Past Surgical History:  Procedure Laterality Date   APPLICATION OF WOUND VAC Right 01/03/2015   Procedure: APPLICATION OF INCISIONAL  WOUND VAC;  Surgeon: Newt Minion, MD;  Location: [facility];  Service: Orthopedics;  Laterality: Right;   CARPAL TUNNEL RELEASE Bilateral 04   COLONOSCOPY W/ BIOPSIES AND POLYPECTOMY     KNEE ARTHROSCOPY Right  12   TOTAL KNEE ARTHROPLASTY Right 01/30/2014   Procedure: Right Total Knee Arthroplasty;  Surgeon: Newt Minion, MD;  Location: [facility];  Service: Orthopedics;  Laterality: Right;   TOTAL KNEE REVISION Right 01/03/2015   Procedure: Revision Right Total Knee Arthroplasty, Poly Exchange, Place Antibiotic Beads;  Surgeon: Newt Minion, MD;  Location: [facility];  Service: Orthopedics;  Laterality: Right;   TRIGGER FINGER RELEASE Right 12   rt thumb    Family History  Problem Relation Age of Onset   Peripheral vascular disease  Father    Alcoholism Mother    Hypertension Maternal Grandfather    Leukemia Maternal Aunt    Breast cancer Maternal Aunt     Social History:  reports that he has never smoked. He has never used smokeless tobacco. He reports that he does not drink alcohol and does not use drugs.  Allergies:  Allergies  Allergen Reactions   Latex Other (See Comments)    Latex cath caused burning and irritation Latex cath caused burning and irritation    Levaquin [Levofloxacin In D5w] Other (See Comments)    Muscle aches   Keflex [Cephalexin] Rash   Lisinopril Cough   Sulfa Antibiotics Rash    Medications: Prior to Admission medications   Medication Sig Start Date End Date Taking? Authorizing Provider  acetaminophen (TYLENOL) 500 MG tablet Take 500 mg by mouth 3 (three) times daily.   Yes [provider]  albuterol (PROVENTIL HFA;VENTOLIN HFA) 108 (90 BASE) MCG/ACT inhaler Inhale 1-2 puffs into the lungs every 6 (six) hours as needed for wheezing or shortness of breath.   Yes [provider]  cholecalciferol (VITAMIN D) 1000 UNITS tablet Take 2,000 Units by mouth daily.   Yes [provider]  Cholecalciferol 50 MCG (2000 UT) TABS Take by mouth. 01/25/18  Yes [provider]  ferrous gluconate (FERGON) 324 MG tablet Take by mouth. 11/12/19  Yes [provider]  finasteride (PROSCAR) 5 MG tablet Take 5 mg by mouth daily. 01/16/21  Yes [provider]  fluticasone (FLONASE) 50 MCG/ACT nasal spray Place 1 spray into both nostrils daily.   Yes [provider]  folic acid (FOLVITE) 1 MG tablet Take 1 mg by mouth daily. 11/21/17  Yes [provider]  furosemide (LASIX) 20 MG tablet Take 20 mg by mouth daily.   Yes [provider]  furosemide (LASIX) 40 MG tablet Take 40 mg by mouth daily.   Yes [provider]  gabapentin (NEURONTIN) 100 MG capsule Take 100 mg by mouth 3 (three) times daily. 01/16/21  Yes [provider]  Heparin Sod, Pork, Lock Flush (HEPARIN LOCK FLUSH IV) Inject 5 mLs into the vein every 12 (twelve) hours.   Yes [provider]  HYDROcodone-acetaminophen (NORCO/VICODIN) 5-325 MG tablet Take 1 tablet by mouth every 8 (eight) hours as needed for moderate pain.   Yes [provider]  Lidocaine-Menthol 3.6-1.25 % PTCH Place onto the skin. 01/16/21  Yes [provider]  promethazine (PHENERGAN) 25 MG tablet Take 25 mg by mouth every 8 (eight) hours as needed. 12/10/20  Yes [provider]  rifampin (RIFADIN) 150 MG capsule Take 150 mg by mouth 2 (two) times daily. 3 capsules ('450mg'$ ) bid 01/16/21  Yes [provider]  senna-docusate (SENOKOT-S) 8.6-50 MG tablet Take 2 tablets by mouth every 12 (twelve) hours as needed. 01/16/21  Yes [provider]  tamsulosin (FLOMAX) 0.4 MG CAPS capsule Take 0.4 mg by mouth at  bedtime.   Yes [provider]  topiramate (TOPAMAX) 50 MG tablet Take 100 mg by mouth daily. 01/16/21  Yes [provider]  vancomycin IVPB Inject 1,500 mg into the vein daily. For cellulitis. Start date 01/17/21 0900 - hold from 9/19-2-20.  Hold from 9/21-9/22.   Yes [provider]  clotrimazole (LOTRIMIN) 1 % cream Apply 1 application topically 2 (two) times daily. Patient not taking: Reported on 02/02/2021 02/27/20 02/26/21  [provider]  diphenhydrAMINE (BENADRYL) 25 mg capsule Take 1 capsule (25 mg total) by mouth every 6 (six) hours as needed for itching or allergies. Patient not taking: Reported on 02/02/2021 12/27/20   Barton Dubois, MD  gabapentin (NEURONTIN) 100 MG capsule Take 100 mg by mouth 2 (two) times daily. Patient not taking: Reported on 02/02/2021 12/06/20   [provider]  nystatin (MYCOSTATIN/NYSTOP) powder Apply topically 2 (two) times daily. Patient not taking: Reported on 02/02/2021 12/27/20   Barton Dubois, MD  omeprazole (PRILOSEC) 20 MG capsule Take 20 mg by mouth  daily. Patient not taking: No sig reported    [provider]  polyethylene glycol (MIRALAX / GLYCOLAX) 17 g packet Take 17 g by mouth daily. Patient not taking: Reported on 02/02/2021 12/27/20   Barton Dubois, MD  traMADol (ULTRAM) 50 MG tablet Take 1 tablet (50 mg total) by mouth every 8 (eight) hours as needed for severe pain. Patient not taking: Reported on 02/02/2021 12/27/20 12/27/21  Barton Dubois, MD    Scheduled Meds:  acetaminophen  650 mg Oral Q6H   Or   acetaminophen  650 mg Rectal Q6H   Chlorhexidine Gluconate Cloth  6 each Topical Daily   finasteride  5 mg Oral Daily   fluticasone  1 spray Each Nare Daily   folic acid  1 mg Oral Daily   nystatin   Topical BID   rifampin  300 mg Oral Q12H   saccharomyces boulardii  250 mg Oral BID   sodium chloride flush  10-40 mL Intracatheter Q12H   sodium chloride flush  3 mL Intravenous Q8H   tamsulosin  0.4 mg Oral QHS   topiramate  100 mg Oral Daily   Continuous Infusions:  sodium chloride Stopped (02/16/21 0006)   ceFEPime (MAXIPIME) IV 2 g (02/17/21 0848)   lactated ringers 75 mL/hr at 02/17/21 1503   norepinephrine (LEVOPHED) Adult infusion 8 mcg/min (02/17/21 1757)   PRN Meds:.acetaminophen, guaiFENesin-dextromethorphan, HYDROcodone-acetaminophen, influenza vac split quadrivalent PF, ipratropium-albuterol, melatonin, ondansetron (ZOFRAN) IV, sodium chloride flush, traMADol     Results for orders placed or performed during the hospital encounter of 01/29/2021 (from the past 48 hour(s))  Glucose, capillary     Status: None   Collection Time: 02/15/21  7:00 PM  Result Value Ref Range   Glucose-Capillary 83 70 - 99 mg/dL    Comment: Glucose reference range applies only to samples taken after fasting for at least 8 hours.  Comprehensive metabolic panel     Status: Abnormal   Collection Time: 02/16/21  4:29 AM  Result Value Ref Range   Sodium 140 135 - 145 mmol/L   Potassium 3.9 3.5 - 5.1 mmol/L   Chloride 108 98 -  111 mmol/L   CO2 25 22 - 32 mmol/L   Glucose, Bld 84 70 - 99 mg/dL    Comment: Glucose reference range applies only to samples taken after fasting for at least 8 hours.   BUN 46 (H) 8 - 23 mg/dL   Creatinine, Ser 2.90 (H) 0.61 -  1.24 mg/dL   Calcium 7.6 (L) 8.9 - 10.3 mg/dL   Total Protein 6.6 6.5 - 8.1 g/dL   Albumin 1.8 (L) 3.5 - 5.0 g/dL   AST 24 15 - 41 U/L   ALT 16 0 - 44 U/L   Alkaline Phosphatase 72 38 - 126 U/L   Total Bilirubin 1.0 0.3 - 1.2 mg/dL   GFR, Estimated 23 (L) >60 mL/min    Comment: (NOTE) Calculated using the CKD-EPI Creatinine Equation (2021)    Anion gap 7 5 - 15    Comment: Performed at [facility], [address] [place], [place] [address]  CBC     Status: Abnormal   Collection Time: 02/16/21  4:29 AM  Result Value Ref Range   WBC 6.1 4.0 - 10.5 K/uL   RBC 2.58 (L) 4.22 - 5.81 MIL/uL   Hemoglobin 7.4 (L) 13.0 - 17.0 g/dL   HCT 24.7 (L) 39.0 - 52.0 %   MCV 95.7 80.0 - 100.0 fL   MCH 28.7 26.0 - 34.0 pg   MCHC 30.0 30.0 - 36.0 g/dL   RDW 17.9 (H) 11.5 - 15.5 %   Platelets 92 (L) 150 - 400 K/uL    Comment: Immature Platelet Fraction may be clinically indicated, consider ordering this additional test [id number]    nRBC 0.0 0.0 - 0.2 %    Comment: Performed at [facility], [address], [place], [place] [address]  Magnesium     Status: None   Collection Time: 02/16/21  4:29 AM  Result Value Ref Range   Magnesium 1.8 1.7 - 2.4 mg/dL    Comment: Performed at [facility], [address], [place], [place] [address]  Ammonia     Status: None   Collection Time: 02/16/21  4:29 AM  Result Value Ref Range   Ammonia 33 9 - 35 umol/L    Comment: Performed at [facility], [address], [place], [place] [address]  Hemoglobin and hematocrit, blood     Status: Abnormal   Collection Time: 02/16/21 12:17 PM  Result Value Ref Range   Hemoglobin 8.1 (L) 13.0 - 17.0 g/dL   HCT 27.0 (L) 39.0 - 52.0 %    Comment: Performed at [facility], [address]  [place], [place] [address]  Comprehensive metabolic panel     Status: Abnormal   Collection Time: 02/17/21  4:48 AM  Result Value Ref Range   Sodium 142 135 - 145 mmol/L   Potassium 4.0 3.5 - 5.1 mmol/L   Chloride 111 98 - 111 mmol/L   CO2 24 22 - 32 mmol/L   Glucose, Bld 92 70 - 99 mg/dL    Comment: Glucose reference range applies only to samples taken after fasting for at least 8 hours.   BUN 48 (H) 8 - 23 mg/dL   Creatinine, Ser 3.12 (H) 0.61 - 1.24 mg/dL   Calcium 7.7 (L) 8.9 - 10.3 mg/dL   Total Protein 7.4 6.5 - 8.1 g/dL   Albumin 2.1 (L) 3.5 - 5.0 g/dL   AST 26 15 - 41 U/L   ALT 18 0 - 44 U/L   Alkaline Phosphatase 72 38 - 126 U/L   Total Bilirubin 1.1 0.3 - 1.2 mg/dL   GFR, Estimated 21 (L) >60 mL/min    Comment: (NOTE) Calculated using the CKD-EPI Creatinine Equation (2021)    Anion gap 7 5 - 15    Comment: Performed at [facility], [address], [place], [place] [address]  CBC  Status: Abnormal   Collection Time: 02/17/21  4:48 AM  Result Value Ref Range   WBC 9.3 4.0 - 10.5 K/uL   RBC 3.07 (L) 4.22 - 5.81 MIL/uL   Hemoglobin 8.8 (L) 13.0 - 17.0 g/dL   HCT 29.2 (L) 39.0 - 52.0 %   MCV 95.1 80.0 - 100.0 fL   MCH 28.7 26.0 - 34.0 pg   MCHC 30.1 30.0 - 36.0 g/dL   RDW 18.3 (H) 11.5 - 15.5 %   Platelets 143 (L) 150 - 400 K/uL   nRBC 0.0 0.0 - 0.2 %    Comment: Performed at [facility], [address] [place], [place] [address]  Magnesium     Status: None   Collection Time: 02/17/21  4:48 AM  Result Value Ref Range   Magnesium 1.9 1.7 - 2.4 mg/dL    Comment: Performed at [facility], [address], [place], [place] [address]  Ammonia     Status: None   Collection Time: 02/17/21  4:48 AM  Result Value Ref Range   Ammonia 24 9 - 35 umol/L    Comment: Performed at [facility], [address] [place], [place] [address]  Glucose, capillary     Status: None   Collection Time: 02/17/21 11:49 AM  Result Value Ref Range   Glucose-Capillary 95 70 - 99 mg/dL     Comment: Glucose reference range applies only to samples taken after fasting for at least 8 hours.  Ammonia     Status: Abnormal   Collection Time: 02/17/21  1:26 PM  Result Value Ref Range   Ammonia 50 (H) 9 - 35 umol/L    Comment: Performed at [facility], [address] [place], [place] [address]    Studies/Results:  EEG IMPRESSION: This study showed generalized periodic discharges with triphasic morphology at 2-2.5 Hz which is on the ictal-interictal continuum with low to intermediate potential for seizures. This eeg pattern can also bee seen due to toxic-metabolic causes like hyperammonemia, renal failure, cefepime toxicity. Additionally there is moderate to severe diffuse encephalopathy, nonspecific etiology. No definite seizures were seen throughout the recording.     HEAD CT SCAN   FINDINGS: Brain: Hypodensity in the right anterior temporal lobe, which may be artifactual (series 3, image 11). No acute hemorrhage, hydrocephalus, extra-axial collection, mass, mass effect, or midline shift.   Vascular: No hyperdense vessel or unexpected calcification.   Skull: Normal. Negative for fracture or focal lesion.   Sinuses/Orbits: No acute finding. Status post bilateral lens replacements.   Other: Trace fluid in the mastoid air cells.   IMPRESSION: Hypodensity in the right anterior temporal lobe, which may be artifactual but could indicate an acute infarct. If there is clinical concern for infarct, an MRI without contrast is recommended.       Head CT scan is reviewed in person and no abnormalities appreciated.   Yacoub Diltz A. Merlene Laughter, M.D.  Diplomate, Tax adviser of Psychiatry and Neurology ( Neurology). 02/17/2021, 6:50 PM

## 2021-02-18 DIAGNOSIS — Z515 Encounter for palliative care: Secondary | ICD-10-CM

## 2021-02-18 DIAGNOSIS — Z7189 Other specified counseling: Secondary | ICD-10-CM

## 2021-02-18 DIAGNOSIS — R7881 Bacteremia: Secondary | ICD-10-CM | POA: Diagnosis not present

## 2021-02-18 DIAGNOSIS — N179 Acute kidney failure, unspecified: Secondary | ICD-10-CM | POA: Diagnosis not present

## 2021-02-18 DIAGNOSIS — L03313 Cellulitis of chest wall: Secondary | ICD-10-CM | POA: Diagnosis not present

## 2021-02-18 DIAGNOSIS — N189 Chronic kidney disease, unspecified: Secondary | ICD-10-CM

## 2021-02-18 LAB — COMPREHENSIVE METABOLIC PANEL
ALT: 17 U/L (ref 0–44)
AST: 25 U/L (ref 15–41)
Albumin: 2 g/dL — ABNORMAL LOW (ref 3.5–5.0)
Alkaline Phosphatase: 72 U/L (ref 38–126)
Anion gap: 7 (ref 5–15)
BUN: 59 mg/dL — ABNORMAL HIGH (ref 8–23)
CO2: 24 mmol/L (ref 22–32)
Calcium: 8 mg/dL — ABNORMAL LOW (ref 8.9–10.3)
Chloride: 111 mmol/L (ref 98–111)
Creatinine, Ser: 3.82 mg/dL — ABNORMAL HIGH (ref 0.61–1.24)
GFR, Estimated: 17 mL/min — ABNORMAL LOW (ref >60)
Glucose, Bld: 120 mg/dL — ABNORMAL HIGH (ref 70–99)
Potassium: 4.1 mmol/L (ref 3.5–5.1)
Sodium: 142 mmol/L (ref 135–145)
Total Bilirubin: 1.1 mg/dL (ref 0.3–1.2)
Total Protein: 7.1 g/dL (ref 6.5–8.1)

## 2021-02-18 LAB — BLOOD GAS, VENOUS
Acid-base deficit: 3.1 mmol/L — ABNORMAL HIGH (ref 0.0–2.0)
Bicarbonate: 21.2 mmol/L (ref 20.0–28.0)
FIO2: 30
O2 Saturation: 85.5 %
Patient temperature: 36.7
pCO2, Ven: 57.2 mmHg (ref 44.0–60.0)
pH, Ven: 7.234 — ABNORMAL LOW (ref 7.250–7.430)
pO2, Ven: 56.2 mmHg — ABNORMAL HIGH (ref 32.0–45.0)

## 2021-02-18 LAB — CBC
HCT: 26.3 % — ABNORMAL LOW (ref 39.0–52.0)
Hemoglobin: 7.8 g/dL — ABNORMAL LOW (ref 13.0–17.0)
MCH: 28.7 pg (ref 26.0–34.0)
MCHC: 29.7 g/dL — ABNORMAL LOW (ref 30.0–36.0)
MCV: 96.7 fL (ref 80.0–100.0)
Platelets: 98 10*3/uL — ABNORMAL LOW (ref 150–400)
RBC: 2.72 MIL/uL — ABNORMAL LOW (ref 4.22–5.81)
RDW: 18.3 % — ABNORMAL HIGH (ref 11.5–15.5)
WBC: 8.2 10*3/uL (ref 4.0–10.5)
nRBC: 0 % (ref 0.0–0.2)

## 2021-02-18 LAB — MAGNESIUM: Magnesium: 1.9 mg/dL (ref 1.7–2.4)

## 2021-02-18 MED ORDER — CHLORHEXIDINE GLUCONATE 0.12 % MT SOLN
15.0000 mL | Freq: Two times a day (BID) | OROMUCOSAL | Status: DC
  Administered 2021-02-18 (×2): 15 mL via OROMUCOSAL
  Filled 2021-02-18: qty 15

## 2021-02-18 MED ORDER — ORAL CARE MOUTH RINSE
15.0000 mL | Freq: Two times a day (BID) | OROMUCOSAL | Status: DC
  Administered 2021-02-18 (×2): 15 mL via OROMUCOSAL

## 2021-02-18 NOTE — Consult Note (Signed)
Consultation Note Date: 02/18/2021   Patient Name: Eddie Fletcher  DOB: 18-Jun-1955  MRN: [id number]  Age / Sex: 65 y.o., male  PCP: [name], [name], MD Referring Physician: Deatra James, MD  Reason for Consultation: Establishing goals of care  HPI/Patient Profile: 65 y.o. male  with past medical history of hypertension, chronic back pain, multiple falls, sleep apnea, obesity, BPH admitted from [facility] on 5/73/2202 with fever and hypoxia. Undergoing treatment with IV vancomycin for cellulitis of scrotum and legs. 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 Found to have cellulitis to chest wall and Pseudomonas bacteremia and ID recommending IV cefepime until 02/25/21. Prolonged hospitalization with complicated infection and now with declining mental status requiring BiPAP and transfer to ICU. 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 Overall prognosis is poor.   Clinical Assessment and Goals of Care: I met today at Eddie Fletcher bedside with his wife and sister. We had a long discussion reviewing his medical history of complicated infections and what they describe as functional decline at home as well (noted that he slept in his car one night because he was too weak to get up his ramp). They have been confused as to expectations as one day they seem hopeful and the next day they get more bad news. They are unsure what to think.   We discussed the overall big picture of infection and now progressively worsening kidney function. Discussed multiorgan dysfunction with now altered mental state, poor lung function due to body habitus and CO2 buildup, liver dysfunction and high ammonia. Questionable stroke. Discussed the burden of chronic illness with acute illness and how the body can only sustain so much. I expressed that I worry that he continues to decline despite aggressive care and prolonged  hospitalization he is getting worse instead of better. Now him mental state is in a position where he cannot speak, eat/drink, or communicate his thoughts and needs. I worry that he is so debilitated that his body will not be able to recover from this illness and I anticipate that he is likely to continue to decline. Family understand his critical condition.   After our discussion and family allowed time to ask questions they express that they have better understanding of his overall condition. They express that they do not want for him to suffer and would not want to continue putting him through interventions that we do not believe will improve him. We discussed code status and they are interested in DNR status. HOWEVER - main concern is their daughter who has baseline struggles with her mental health and anxiety. Wife, Eddie Fletcher, is concerned as she wants to be honest with their daughter about poor prognosis but also is concerned their daughter will be angry with her for making any decisions such as DNR. Eddie Fletcher will plan to speak with their daughter today after support from her close friend and pastor for conversation. We will see how this conversation goes and with tentative plans for DNR and comfort if this goes well. I have  offered to meet or speak with daughter over the phone if they feel this will be helpful.   All questions/concerns addressed to the best of my ability. Emotional support provided. Updated Dr. Roger Shelter, RN, chaplain.   Primary Decision Maker NEXT OF KIN wife    SUMMARY OF RECOMMENDATIONS   - Considering DNR and transition to comfort measures (NOT interested in hospice at all)  Code Status/Advance Care Planning: Full code - ongoing discussion   Symptom Management:  Per attending.   Palliative Prophylaxis:  Aspiration, Bowel Regimen, Delirium Protocol, Frequent Pain Assessment, Oral Care, and Turn Reposition  Psycho-social/Spiritual:  Desire for further Chaplaincy  support:yes Additional Recommendations: Grief/Bereavement Support  Prognosis:  Overall prognosis poor with multiorgan dysfunction and declining functional status. Not improving with treatment.   Discharge Planning: To Be Determined      Primary Diagnoses: Present on Admission:  Cellulitis of chest wall   I have reviewed the medical record, interviewed the patient and family, and examined the patient. The following aspects are pertinent.  Past Medical History:  Diagnosis Date   Arthritis    Cardiomegaly    Cellulitis    Chest discomfort    15 years ago; had negative testing and told it was related to stress   Colon polyps    Complication of anesthesia    blood pressure - low post op   Diaphoresis    Edema    Leg   GERD (gastroesophageal reflux disease)    Hemorrhoids    History of kidney stones    Hypertension    Obesity    Seasonal allergies    Sleep apnea    cpap 3 yrs-not used for 3-4 months   SOB (shortness of breath)    occ with exersion   Wears glasses    Social History   Socioeconomic History   Marital status: Married    Spouse name: Not on file   Number of children: Not on file   Years of education: Not on file   Highest education level: Not on file  Occupational History   Not on file  Tobacco Use   Smoking status: Never   Smokeless tobacco: Never  Vaping Use   Vaping Use: Never used  Substance and Sexual Activity   Alcohol use: No    Comment: quit 15 yrs   Drug use: No   Sexual activity: Not on file  Other Topics Concern   Not on file  Social History Narrative   Patient's originally from [place] but was born [place]. 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 Has 3 cats. No bird or mold exposure. No recent travel. He does report there is water damage under his house.   Social Determinants of Health   Financial Resource Strain: Not on file  Food Insecurity: Not on file   Transportation Needs: Not on file  Physical Activity: Not on file  Stress: Not on file  Social Connections: Not on file   Family History  Problem Relation Age of Onset   Peripheral vascular disease Father    Alcoholism Mother    Hypertension Maternal Grandfather    Leukemia Maternal Aunt    Breast cancer Maternal Aunt    Scheduled Meds:  acetaminophen  650 mg Oral Q6H   Or   acetaminophen  650 mg Rectal Q6H   chlorhexidine  15 mL Mouth Rinse BID   Chlorhexidine Gluconate Cloth  6 each  Topical Daily   finasteride  5 mg Oral Daily   fluticasone  1 spray Each Nare Daily   folic acid  1 mg Oral Daily   mouth rinse  15 mL Mouth Rinse q12n4p   nystatin   Topical BID   rifampin  300 mg Oral Q12H   saccharomyces boulardii  250 mg Oral BID   sodium chloride flush  10-40 mL Intracatheter Q12H   sodium chloride flush  3 mL Intravenous Q8H   tamsulosin  0.4 mg Oral QHS   topiramate  100 mg Oral Daily   Continuous Infusions:  sodium chloride Stopped (02/16/21 0006)   ceFEPime (MAXIPIME) IV Stopped (02/18/21 0805)   lactated ringers 75 mL/hr at 02/18/21 0900   norepinephrine (LEVOPHED) Adult infusion 2 mcg/min (02/18/21 0900)   PRN Meds:.acetaminophen, guaiFENesin-dextromethorphan, HYDROcodone-acetaminophen, influenza vac split quadrivalent PF, ipratropium-albuterol, melatonin, ondansetron (ZOFRAN) IV, sodium chloride flush, traMADol Allergies  Allergen Reactions   Latex Other (See Comments)    Latex cath caused burning and irritation Latex cath caused burning and irritation    Levaquin [Levofloxacin In D5w] Other (See Comments)    Muscle aches   Keflex [Cephalexin] Rash   Lisinopril Cough   Sulfa Antibiotics Rash   Review of Systems  Unable to perform ROS: Acuity of condition   Physical Exam Vitals and nursing note reviewed.  Constitutional:      General: He is not in acute distress.    Appearance: He is morbidly obese. He is ill-appearing.  Cardiovascular:     Rate  and Rhythm: Normal rate.     Comments: Extremities edematous and cool to touch Pulmonary:     Effort: No tachypnea, accessory muscle usage or respiratory distress.  Abdominal:     General: There is distension.  Neurological:     Mental Status: He is unresponsive.    Vital Signs: BP (!) 112/37   Pulse 79   Temp 98.1 F (36.7 C) (Axillary)   Resp 13   Ht _0  (1.676 m)   Wt (!) 178.9 kg   SpO2 100%   BMI 63.66 kg/m  Pain Scale: CPOT POSS *See Group Information*: 2-Acceptable,Slightly drowsy, easily aroused Pain Score: 0-No pain   SpO2: SpO2: 100 % O2 Device:SpO2: 100 % O2 Flow Rate: .O2 Flow Rate (L/min): 4 L/min  IO: Intake/output summary:  Intake/Output Summary (Last 24 hours) at 02/18/2021 6109 Last data filed at 02/18/2021 0900 Gross per 24 hour  Intake 3127.99 ml  Output 1350 ml  Net 1777.99 ml    LBM: Last BM Date: 02/16/21 Baseline Weight: Weight: (!) 172.4 kg Most recent weight: Weight: (!) 178.9 kg     Palliative Assessment/Data:     Time In: 1200 Time Out: 1355 Time Total: 115 min Greater than 50%  of this time was spent counseling and coordinating care related to the above assessment and plan.  Signed by: Vinie Sill, NP Palliative Medicine Team Pager # [phone] (M-F 8a-5p) Team Phone # [phone] (Nights/Weekends)

## 2021-02-18 NOTE — Plan of Care (Signed)

## 2021-02-18 NOTE — Progress Notes (Addendum)
PROGRESS NOTE    Eddie Fletcher  [id number] DOB: 11-Jan-1956 DOA: 01/14/2021 PCP: Curly Rim, MD    Subjective:  The patient was seen and examined this morning, nursing staff was trying to place condom catheter.  Patient only withdrew to pain stimuli, pupils were pinpoint Remained obtunded afebrile, blood pressure 130/39 on Levophed Per nursing he has not had any of his oral medication due to worsening mental status.   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   In the emergency department, patient was notably tachycardic, febrile with a temperature of 101.0 F, BP 118/39, SPO2 97% on 2 L nasal cannula.  Lab work-up notable for leukocytosis, normocytic anemia, creatinine 2.63, influenza A/B and COVID-19 PCR negative.  Chest x-ray with cardiomegaly with findings suggestive of CHF/pulmonary edema. CT chest without contrast with marked severity cellulitis along the lateral aspect of the mid and lower chest wall, marked severity splenomegaly.  Patient was started on IV Azactam, Tylenol given due to fever and started on IV fluid hydration. TRH consulted for further evaluation and management of sepsis.    -Patient has continued to have significant clinical deterioration with altered mentation as well as need for pressors.  He will require PICC line placement on 10/11.  Palliative has been consulted for further goals of care discussion as it does not appear that he may make it through this hospitalization.  CT head does demonstrate findings of possible acute CVA to right temporal lobe, but he does not appear to be  stable for MRI.  EEG ordered to evaluate for any seizure activity and is currently pending.  Neurology consulted for further evaluation.   Assessment & Plan:   Principal Problem:   Cellulitis of chest wall Active Problems:   Obstructive sleep apnea   Chronic low back pain   Morbid obesity with BMI of 60.0-69.9, adult (HCC)   Multiple falls   Elevated brain natriuretic peptide (BNP) level   Pulmonary edema   Splenomegaly, not elsewhere classified   Ascites   Chronic venous stasis dermatitis of both lower extremities   Chronic kidney disease   Acute febrile illness   Acute respiratory failure with hypoxia (HCC)   Cellulitis of scrotum   Bacteremia due to Pseudomonas   Acute dyspnea   Acute metabolic encephalopathy-worsening Hypoxemia, hypercapnia -Patient is progressively getting worse, obtunded, withdraws to pain stimuli only Does not follow any command Recent abg suggests hypercarbia, pt is continued on bipap as tolerated  -Cont with bipap as tolerated -If patient ever gets better and stable outpatient sleep study is recommended   -Noted to have ongoing cognitive decline with CT head now showing right temporal lobe possible CVA.   EEG ordered >>>   Appreciate neurology evaluation.   Pseudomonas septicemia Patient during hospitalization continued with intermittent fevers.   -Remained hypotensive on Levophed -Pt was initially continued with PICC line for outpatient antibiotics for recent right prosthetic knee infection. PICC line has since been removed on 02/10/2021 given concerns for line infection --Infectious disease following, appreciate assistance --Continued on cefepime 2g IV q12h, started 02/08/2021 --Repeat blood cultures x2 10/5: No growth x5 days --Cont course of abx for now for MSSE -  ID recommendations as noted on note from 10/5 with plans to continue rifampin through 10/19 as well as cefepime.  Circulatory shock -PICC line placed 10/11, no further growth on  blood cultures noted -Continue norepinephrine - -blood pressure stable on pressors   Right knee prostatic joint infection Afebrile, hypotensive Patient developed MSSE infection of right knee prosthesis, evaluated at [place].  Discharged on long-term antibiotics for 6 weeks to end 02/25/2021 via PICC line with vancomycin and rifampin. --was initially continued on daptomycin at [facility], since later being discontinued by ID --Continue rifampin 300 mg p.o. twice daily --Outpatient follow-up with [facility], ID recommendations as noted above   Acute renal failure on CKD stage IV Baseline creatinine 1.8-2.5.   Worsening kidney function  ...  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  Appears stable and at baseline. -- Hold furosemide with poor oral intake -- Strict I's and O's and daily weights -Recent ammonia levels are stable, continue monitoring ammonia level -Rectal tube in place, diarrhea, with lactulose   Chronic venous stasis dermatitis bilateral lower extremities, chronic --Supportive care --Lower extremity elevation --Furosemide currently held   Essential hypertension-currently hypotensive -- Hold furosemide with poor oral intake -Start IV fluid 10/11 -On Levophed   BPH: Finasteride 5 mg p.o. daily, tamsulosin 0.4 mg nightly   Chest wall cellulitis: Resolved Scrotal cellulitis: Resolved   Morbid obesity Body mass index is 64.62 kg/m.  Discussed with patient needs for aggressive lifestyle changes/weight loss as this complicates all facets of care.  Outpatient follow-up with PCP.  May benefit from bariatric evaluation  outpatient. Recommend sleep study if not done already   Weakness/deconditioning/debility: From [facility].  Seen by PT/OT recommending return to SNF. --Pending insurance authorization for [place], [place]   Ethics: Patient is steadily declining mentally and medically, worsening kidney function, remained encephalopathic hypotensive... Palliative care team consulted --family meeting today   DVT prophylaxis: SCD's Code Status: Full Family Communication: Discussed with wife and sister bedside 10/11   Status is: Inpatient   Remains inpatient appropriate because:Inpatient level of care appropriate due to severity of illness   Dispo: The patient is from: SNF              Anticipated d/c is to: SNF vs hospice with grave prognosis              Patient currently is not medically stable to d/c.              Difficult to place patient No     Consultants:  ID   Procedures:   See below  Antimicrobials:  Anti-infectives (From admission, onward)    Start     Dose/Rate Route Frequency Ordered Stop   02/11/21 2200  rifampin (RIFADIN) capsule 300 mg        300 mg Oral Every 12 hours 02/11/21 0925     02/08/21 2000  ceFEPIme (MAXIPIME) 2 g in sodium chloride 0.9 % 100 mL IVPB        2 g 200 mL/hr over 30 Minutes Intravenous Every 12 hours 02/08/21 1302     02/08/21 1200  piperacillin-tazobactam (ZOSYN) IVPB 3.375 g  Status:  Discontinued  3.375 g 12.5 mL/hr over 240 Minutes Intravenous Every 8 hours 02/08/21 0558 02/08/21 1302   02/08/21 0600  piperacillin-tazobactam (ZOSYN) IVPB 3.375 g        3.375 g 100 mL/hr over 30 Minutes Intravenous STAT 02/08/21 0558 02/08/21 0716   02/03/21 2000  DAPTOmycin (CUBICIN) 900 mg in sodium chloride 0.9 % IVPB  Status:  Discontinued        900 mg 136 mL/hr over 30 Minutes Intravenous Daily 02/03/21 1516 02/11/21 0925   02/02/21 1410  vancomycin variable dose per unstable renal function (pharmacist dosing)  Status:  Discontinued          Does not apply See admin instructions 02/02/21 1410 02/03/21 1516   02/02/21 1400  rifampin (RIFADIN) capsule 300 mg  Status:  Discontinued        300 mg Oral 3 times daily 02/02/21 1106 02/11/21 0925   02/02/21 1330  amoxicillin-clavulanate (AUGMENTIN) 875-125 MG per tablet 1 tablet        1 tablet Oral Every 12 hours 02/02/21 1238 02/06/21 2026   02/02/21 1000  rifampin (RIFADIN) capsule 450 mg  Status:  Discontinued        450 mg Oral Every 12 hours 02/02/21 0701 02/02/21 1106   02/02/21 0830  vancomycin (VANCOREADY) IVPB 2000 mg/400 mL        2,000 mg 200 mL/hr over 120 Minutes Intravenous  Once 02/02/21 0803 02/02/21 1547   01/31/21 0330  ceFEPIme (MAXIPIME) 2 g in sodium chloride 0.9 % 100 mL IVPB  Status:  Discontinued        2 g 200 mL/hr over 30 Minutes Intravenous Every 12 hours 01/08/2021 2214 02/02/21 0701   01/31/21 0030  linezolid (ZYVOX) tablet 600 mg  Status:  Discontinued        600 mg Oral Every 12 hours 01/26/2021 2344 02/02/21 0701   02/04/2021 2330  vancomycin (VANCOREADY) IVPB 1500 mg/300 mL  Status:  Discontinued       See Hyperspace for full Linked Orders Report.   1,500 mg 150 mL/hr over 120 Minutes Intravenous  Once 01/08/2021 2148 02/04/2021 2151   02/06/2021 2230  vancomycin (VANCOCIN) IVPB 1000 mg/200 mL premix  Status:  Discontinued       See Hyperspace for full Linked Orders Report.   1,000 mg 200 mL/hr over 60 Minutes Intravenous  Once 01/10/2021 2148 01/14/2021 2151   01/25/2021 2145  vancomycin (VANCOCIN) IVPB 1000 mg/200 mL premix  Status:  Discontinued        1,000 mg 200 mL/hr over 60 Minutes Intravenous  Once 01/08/2021 2140 01/23/2021 2145   01/20/2021 2145  ceFEPIme (MAXIPIME) 2 g in sodium chloride 0.9 % 100 mL IVPB  Status:  Discontinued        2 g 200 mL/hr over 30 Minutes Intravenous  Once 02/05/2021 2140 01/19/2021 2212   01/08/2021 1830  aztreonam (AZACTAM) 2 g in sodium chloride 0.9 % 100 mL IVPB        2 g 200 mL/hr over 30 Minutes Intravenous  Once 01/14/2021 1821  01/29/2021 2034       Objective: Vitals:   02/18/21 1000 02/18/21 1100 02/18/21 1129 02/18/21 1200  BP: (!) 103/44 (!) 113/48  (!) 130/39  Pulse: 83 79 77 82  Resp: '13 12 14 17  '$ Temp:   98.1 F (36.7 C)   TempSrc:   Axillary   SpO2: 100% 100% 100% 100%  Weight:      Height:  Intake/Output Summary (Last 24 hours) at 02/18/2021 1247 Last data filed at 02/18/2021 1200 Gross per 24 hour  Intake 3172.03 ml  Output 1350 ml  Net 1822.03 ml   Filed Weights   02/15/21 0500 02/16/21 0500 02/17/21 0500  Weight: (!) 179.1 kg (!) 178.1 kg (!) 178.9 kg       Physical Exam:   General:  Remained encephalopathic, obtunded, withdraws to pain  HEENT:   Normal limits -pinpoint pupil   Neuro:  Limited exam, obtunded  Lungs:   Clear to auscultation BL, Respirations unlabored, no wheezes / crackles  Cardio:    S1-S2, regular rate rhythm  Abdomen:   Obese, large, soft nontender  Muscular skeletal:  Limited exam -withdraws to pain stimuli, mild pitting edema  Skin:  Dry, warm to touch, negative for any Rashes,  Wounds: Please see nursing documentation          Data Reviewed: I have personally reviewed following labs and imaging studies  CBC: Recent Labs  Lab 02/14/21 0524 02/15/21 0519 02/16/21 0429 02/16/21 1217 02/17/21 0448 02/18/21 0448  WBC 5.5 6.5 6.1  --  9.3 8.2  HGB 8.4* 8.5* 7.4* 8.1* 8.8* 7.8*  HCT 28.2* 27.9* 24.7* 27.0* 29.2* 26.3*  MCV 95.6 94.3 95.7  --  95.1 96.7  PLT 94* 107* 92*  --  143* 98*   Basic Metabolic Panel: Recent Labs  Lab 02/13/21 0518 02/14/21 0524 02/15/21 0519 02/16/21 0429 02/17/21 0448 02/18/21 0448  NA 139 139 139 140 142 142  K 3.8 4.0 3.9 3.9 4.0 4.1  CL 108 107 109 108 111 111  CO2 '25 26 24 25 24 24  '$ GLUCOSE 112* 109* 100* 84 92 120*  BUN 38* 39* 41* 46* 48* 59*  CREATININE 2.56* 2.51* 2.63* 2.90* 3.12* 3.82*  CALCIUM 7.7* 7.9* 7.9* 7.6* 7.7* 8.0*  MG 1.9 1.9  --  1.8 1.9 1.9   GFR: Estimated Creatinine  Clearance: 30.3 mL/min (A) (by C-G formula based on SCr of 3.82 mg/dL (H)). Liver Function Tests: Recent Labs  Lab 02/14/21 0524 02/15/21 0519 02/16/21 0429 02/17/21 0448 02/18/21 0448  AST '20 22 24 26 25  '$ ALT '15 17 16 18 17  '$ ALKPHOS 106 93 72 72 72  BILITOT 1.0 1.1 1.0 1.1 1.1  PROT 7.0 7.2 6.6 7.4 7.1  ALBUMIN 1.8* 1.9* 1.8* 2.1* 2.0*   No results for input(s): LIPASE, AMYLASE in the last 168 hours. Recent Labs  Lab 02/13/21 0900 02/15/21 0818 02/16/21 0429 02/17/21 0448 02/17/21 1326  AMMONIA 21 42* 33 24 50*   Coagulation Profile: No results for input(s): INR, PROTIME in the last 168 hours. Cardiac Enzymes: No results for input(s): CKTOTAL, CKMB, CKMBINDEX, TROPONINI in the last 168 hours. BNP (last 3 results) No results for input(s): PROBNP in the last 8760 hours. HbA1C: No results for input(s): HGBA1C in the last 72 hours. CBG: Recent Labs  Lab 02/15/21 1900 02/17/21 1149  GLUCAP 83 95   Lipid Profile: No results for input(s): CHOL, HDL, LDLCALC, TRIG, CHOLHDL, LDLDIRECT in the last 72 hours. Thyroid Function Tests: No results for input(s): TSH, T4TOTAL, FREET4, T3FREE, THYROIDAB in the last 72 hours. Anemia Panel: No results for input(s): VITAMINB12, FOLATE, FERRITIN, TIBC, IRON, RETICCTPCT in the last 72 hours. Sepsis Labs: No results for input(s): PROCALCITON, LATICACIDVEN in the last 168 hours.  Recent Results (from the past 240 hour(s))  Culture, blood (routine x 2)     Status: None   Collection Time: 02/11/21 10:02 AM  Specimen: Right Antecubital; Blood  Result Value Ref Range Status   Specimen Description RIGHT ANTECUBITAL  Final   Special Requests   Final    BOTTLES DRAWN AEROBIC AND ANAEROBIC Blood Culture results may not be optimal due to an excessive volume of blood received in culture bottles   Culture   Final    NO GROWTH 5 DAYS Performed at [facility], [address] [place], [place] [address]    Report Status 02/16/2021 FINAL   Final  Culture, blood (routine x 2)     Status: None   Collection Time: 02/11/21 10:02 AM   Specimen: BLOOD RIGHT ARM  Result Value Ref Range Status   Specimen Description BLOOD RIGHT ARM  Final   Special Requests   Final    BOTTLES DRAWN AEROBIC AND ANAEROBIC Blood Culture adequate volume   Culture   Final    NO GROWTH 5 DAYS Performed at [facility], [address] [place], [place] [address]    Report Status 02/16/2021 FINAL  Final  Blood culture (routine single)     Status: None   Collection Time: 02/12/21  9:59 AM   Specimen: Right Antecubital; Blood  Result Value Ref Range Status   Specimen Description RIGHT ANTECUBITAL  Final   Special Requests   Final    BOTTLES DRAWN AEROBIC AND ANAEROBIC Blood Culture adequate volume   Culture   Final    NO GROWTH 5 DAYS Performed at [facility], [address] [place], [place] [address]    Report Status 02/17/2021 FINAL  Final         Radiology Studies: CT HEAD WO CONTRAST (5MM)  Result Date: 02/17/2021 CLINICAL DATA:  Mental status change EXAM: CT HEAD WITHOUT CONTRAST TECHNIQUE: Contiguous axial images were obtained from the base of the skull through the vertex without intravenous contrast. COMPARISON:  12/17/2020 FINDINGS: Brain: Hypodensity in the right anterior temporal lobe, which may be artifactual (series 3, image 11). No acute hemorrhage, hydrocephalus, extra-axial collection, mass, mass effect, or midline shift. Vascular: No hyperdense vessel or unexpected calcification. Skull: Normal. Negative for fracture or focal lesion. Sinuses/Orbits: No acute finding. Status post bilateral lens replacements. Other: Trace fluid in the mastoid air cells. IMPRESSION: Hypodensity in the right anterior temporal lobe, which may be artifactual but could indicate an acute infarct. If there is clinical concern for infarct, an MRI without contrast is recommended. These results were called by telephone at the time of interpretation on 02/17/2021 at  11:42 am to provider [facility] , who verbally acknowledged these results. Electronically Signed   By: Merilyn Baba M.D.   On: 02/17/2021 11:44   EEG adult  Result Date: 02/17/2021 Lora Havens, MD     02/17/2021  5:05 PM Patient Name: Eddie Fletcher MRN: [id number] Epilepsy Attending: Lora Havens Referring Physician/Provider: Dr Heath Lark Date: 02/17/2021 Duration: 29.23 mins Patient history: 65yo M with ams. EEG to evaluate for seizure Level of alertness: lethargic AEDs during EEG study: TPM Technical aspects: This EEG study was done with scalp electrodes positioned according to the 10-20 International system of electrode placement. Electrical activity was acquired at a sampling rate of '500Hz'$  and reviewed with a high frequency filter of '70Hz'$  and a low frequency filter of '1Hz'$ . EEG data were recorded continuously and digitally stored. Description: EEG showed continuous generalized 2-'3Hz'$  delta slowing. Generalized periodic discharges with triphasic morphology at 2-2.5 Hz were also noted. Hyperventilation and photic stimulation were not performed.   ABNORMALITY - Periodic  discharges with triphasic morphology, generalized ( GPDs) - Continuous slow, generalized IMPRESSION: This study showed generalized periodic discharges with triphasic morphology at 2-2.5 Hz which is on the ictal-interictal continuum with low to intermediate potential for seizures. This eeg pattern can also bee seen due to toxic-metabolic causes like hyperammonemia, renal failure, cefepime toxicity. Additionally there is moderate to severe diffuse encephalopathy, nonspecific etiology. No definite seizures were seen throughout the recording. Dr Manuella Ghazi was notified. Priyanka O Yadav   [place] EKG SITE RITE  Result Date: 02/17/2021 If Site Rite image not attached, placement could not be confirmed due to current cardiac rhythm.       Scheduled Meds:  acetaminophen  650 mg Oral Q6H   Or   acetaminophen  650 mg Rectal Q6H    chlorhexidine  15 mL Mouth Rinse BID   Chlorhexidine Gluconate Cloth  6 each Topical Daily   finasteride  5 mg Oral Daily   fluticasone  1 spray Each Nare Daily   folic acid  1 mg Oral Daily   mouth rinse  15 mL Mouth Rinse q12n4p   nystatin   Topical BID   rifampin  300 mg Oral Q12H   saccharomyces boulardii  250 mg Oral BID   sodium chloride flush  10-40 mL Intracatheter Q12H   sodium chloride flush  3 mL Intravenous Q8H   tamsulosin  0.4 mg Oral QHS   topiramate  100 mg Oral Daily   Continuous Infusions:  sodium chloride Stopped (02/16/21 0006)   ceFEPime (MAXIPIME) IV Stopped (02/18/21 0805)   lactated ringers 75 mL/hr at 02/18/21 1200   norepinephrine (LEVOPHED) Adult infusion 1 mcg/min (02/18/21 1200)     LOS: 19 days    Time spent: 55 minutes, critical time was spent stabilizing this patient, discussing care with ICU team and consultants.  SIGNED: Deatra James, MD, FHM. [facility],  Pager (please use [email or web address] to page/text)  Please use Epic Secure Chat for non-urgent communication (7AM-7PM) If 7PM-7AM, please contact night-coverage [email or web address],  02/18/2021, 12:47 PM

## 2021-02-19 DIAGNOSIS — Z515 Encounter for palliative care: Secondary | ICD-10-CM | POA: Diagnosis not present

## 2021-02-19 DIAGNOSIS — Z7189 Other specified counseling: Secondary | ICD-10-CM | POA: Diagnosis not present

## 2021-02-19 DIAGNOSIS — L03313 Cellulitis of chest wall: Secondary | ICD-10-CM | POA: Diagnosis not present

## 2021-02-19 LAB — BASIC METABOLIC PANEL
Anion gap: 8 (ref 5–15)
BUN: 62 mg/dL — ABNORMAL HIGH (ref 8–23)
CO2: 23 mmol/L (ref 22–32)
Calcium: 7.9 mg/dL — ABNORMAL LOW (ref 8.9–10.3)
Chloride: 111 mmol/L (ref 98–111)
Creatinine, Ser: 4.45 mg/dL — ABNORMAL HIGH (ref 0.61–1.24)
GFR, Estimated: 14 mL/min — ABNORMAL LOW (ref >60)
Glucose, Bld: 100 mg/dL — ABNORMAL HIGH (ref 70–99)
Potassium: 4.1 mmol/L (ref 3.5–5.1)
Sodium: 142 mmol/L (ref 135–145)

## 2021-02-19 LAB — CBC
HCT: 24.5 % — ABNORMAL LOW (ref 39.0–52.0)
Hemoglobin: 7.3 g/dL — ABNORMAL LOW (ref 13.0–17.0)
MCH: 29.1 pg (ref 26.0–34.0)
MCHC: 29.8 g/dL — ABNORMAL LOW (ref 30.0–36.0)
MCV: 97.6 fL (ref 80.0–100.0)
Platelets: 68 10*3/uL — ABNORMAL LOW (ref 150–400)
RBC: 2.51 MIL/uL — ABNORMAL LOW (ref 4.22–5.81)
RDW: 18.6 % — ABNORMAL HIGH (ref 11.5–15.5)
WBC: 6.4 10*3/uL (ref 4.0–10.5)
nRBC: 0 % (ref 0.0–0.2)

## 2021-02-19 MED ORDER — POLYVINYL ALCOHOL 1.4 % OP SOLN: 1.0000 [drp] | Freq: Four times a day (QID) | OPHTHALMIC | Status: DC | PRN

## 2021-02-19 MED ORDER — GLYCOPYRROLATE 0.2 MG/ML IJ SOLN
0.2000 mg | INTRAMUSCULAR | Status: DC | PRN
  Administered 2021-02-20: 0.2 mg via INTRAVENOUS
  Filled 2021-02-19: qty 1

## 2021-02-19 MED ORDER — ONDANSETRON 4 MG PO TBDP: 4.0000 mg | ORAL_TABLET | Freq: Four times a day (QID) | ORAL | Status: DC | PRN

## 2021-02-19 MED ORDER — HALOPERIDOL 0.5 MG PO TABS: 0.5000 mg | ORAL_TABLET | ORAL | Status: DC | PRN

## 2021-02-19 MED ORDER — GLYCOPYRROLATE 0.2 MG/ML IJ SOLN: 0.2000 mg | INTRAMUSCULAR | Status: DC | PRN

## 2021-02-19 MED ORDER — HALOPERIDOL LACTATE 5 MG/ML IJ SOLN: 0.5000 mg | INTRAMUSCULAR | Status: DC | PRN

## 2021-02-19 MED ORDER — DEXTROSE-NACL 5-0.45 % IV SOLN: INTRAVENOUS | Status: DC

## 2021-02-19 MED ORDER — BIOTENE DRY MOUTH MT LIQD: 15.0000 mL | OROMUCOSAL | Status: DC | PRN

## 2021-02-19 MED ORDER — GLYCOPYRROLATE 0.2 MG/ML IJ SOLN
0.2000 mg | Freq: Three times a day (TID) | INTRAMUSCULAR | Status: DC
  Administered 2021-02-19 (×3): 0.2 mg via INTRAVENOUS
  Filled 2021-02-19 (×3): qty 1

## 2021-02-19 MED ORDER — SODIUM CHLORIDE 0.9 % IV SOLN: 2.0000 g | INTRAVENOUS | Status: DC

## 2021-02-19 MED ORDER — HALOPERIDOL LACTATE 2 MG/ML PO CONC
0.5000 mg | ORAL | Status: DC | PRN
  Filled 2021-02-19: qty 0.3

## 2021-02-19 MED ORDER — ONDANSETRON HCL 4 MG/2ML IJ SOLN: 4.0000 mg | Freq: Four times a day (QID) | INTRAMUSCULAR | Status: DC | PRN

## 2021-02-19 MED ORDER — HYDROMORPHONE HCL 1 MG/ML IJ SOLN
1.0000 mg | INTRAMUSCULAR | Status: DC | PRN
  Administered 2021-02-19 – 2021-02-20 (×4): 1 mg via INTRAVENOUS
  Filled 2021-02-19 (×5): qty 1

## 2021-02-19 MED ORDER — GLYCOPYRROLATE 1 MG PO TABS: 1.0000 mg | ORAL_TABLET | ORAL | Status: DC | PRN

## 2021-02-19 NOTE — Progress Notes (Signed)
PHARMACY NOTE:  ANTIMICROBIAL RENAL DOSAGE ADJUSTMENT  Current antimicrobial regimen includes a mismatch between antimicrobial dosage and estimated renal function.  As per policy approved by the Pharmacy & Therapeutics and Medical Executive Committees, the antimicrobial dosage will be adjusted accordingly.  Current antimicrobial dosage:  cefepime 2gm q12h  Indication: Pseudomonas bacteremia, MSSE PJI  Renal Function:  Estimated Creatinine Clearance: 25.9 mL/min (A) (by C-G formula based on SCr of 4.45 mg/dL (H)). '[]'$      On intermittent HD, scheduled: '[]'$      On CRRT    Antimicrobial dosage has been changed to:  Cefepime 2gm IV q24h  Additional comments:   Thank you for allowing pharmacy to be a part of this patient's care.  Doreene Eland, PharmD, BCPS.   Work Cell: [phone] 02/19/2021 8:51 AM

## 2021-02-19 NOTE — Progress Notes (Signed)
**Note De-Identified  Obfuscation** Patient removed from BIPAP and placed on 4L [place], tolerating well. VS WNL. RRT to continue monitoring.

## 2021-02-19 NOTE — Progress Notes (Signed)
PROGRESS NOTE    Eddie Fletcher  [id number] DOB: 02/21/1956 DOA: 02/03/2021 PCP: Curly Rim, MD    Subjective:  The patient was seen and examined, completely unresponsive, becoming hypoxic, satting 85% on 4 L of oxygen, mildly hypotensive Does not withdraw to pain stimuli Patient's wife present at bedside  Patient's wife has had meeting with palliative care and chapel this morning she has determined DNR/DNI status pursuing comfort care but requesting patient to remain in ICU  Brief Narrative:   65 year old male with past medical history significant for essential hypertension, chronic back pain, BPH, OSA, CKD, morbid obesity with 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   In the emergency department, patient was notably tachycardic, febrile with a temperature of 101.0 F, BP 118/39, SPO2 97% on 2 L nasal cannula.  Lab work-up notable for leukocytosis, normocytic anemia, creatinine 2.63, influenza A/B and COVID-19 PCR negative.  Chest x-ray with cardiomegaly with findings suggestive of CHF/pulmonary edema. CT chest without contrast with marked severity cellulitis along the lateral aspect of the mid and lower chest wall, marked severity splenomegaly.  Patient was started on IV Azactam, Tylenol given due to fever and started on IV fluid hydration. TRH consulted for further evaluation and management of sepsis.    -Patient has continued to have significant clinical deterioration with altered mentation as well as need for pressors.  He will require PICC line placement on 10/11.  Palliative has been consulted for further goals of care discussion as it does not appear that he may make it through this hospitalization.  CT head does demonstrate findings of possible acute  CVA to right temporal lobe, but he does not appear to be stable for MRI.  EEG ordered to evaluate for any seizure activity and is currently pending.  Neurology consulted for further evaluation.   Assessment & Plan:   Principal Problem:   Cellulitis of chest wall Active Problems:   Obstructive sleep apnea   Chronic low back pain   Morbid obesity with BMI of 60.0-69.9, adult (HCC)   Multiple falls   Elevated brain natriuretic peptide (BNP) level   Pulmonary edema   Splenomegaly, not elsewhere classified   Ascites   Chronic venous stasis dermatitis of both lower extremities   Chronic kidney disease   Acute febrile illness   Acute respiratory failure with hypoxia (HCC)   Cellulitis of scrotum   Bacteremia due to Pseudomonas   Acute dyspnea   Extensive discussion with family, palliative care team, finally wife is agreed to DNR/DNI status, pursuing with comfort care, hospice requested the patient to remain in ICU for now.  Acute metabolic encephalopathy-worsening Hypoxemia, hypercapnia Satting 85% this morning on 4 L of oxygen -Patient is progressively getting worse, obtunded, withdraws to pain stimuli only Does not follow any command Recent abg suggests hypercarbia, pt is continued on bipap as tolerated  -Cont with bipap as tolerated -If patient ever gets better and stable outpatient sleep study is recommended   -Noted to have ongoing cognitive decline with CT head now showing right temporal lobe possible CVA.   EEG ordered >>> study reporting low to intermittent potential for seizures which is also nonspecific to toxic metabolic encephalopathy hyperammonemia etc. no definitive seizures Appreciate neurology evaluation.   Pseudomonas septicemia Patient during hospitalization continued with intermittent fevers.   -  Remained hypotensive on Levophed -Pt was initially continued with PICC line for outpatient antibiotics for recent right prosthetic knee infection. PICC line has since  been removed on 02/10/2021 given concerns for line infection --Infectious disease following, appreciate assistance --Continued on cefepime 2g IV q12h, started 02/08/2021 --Repeat blood cultures x2 10/5: No growth x5 days --Cont course of abx for now for MSSE -ID recommendations as noted on note from 10/5 with plans to continue rifampin through 10/19 as well as cefepime.  Circulatory shock -PICC line placed 10/11, no further growth on blood cultures noted -Continue norepinephrine - -blood pressure stable on pressors   Right knee prostatic joint infection Afebrile, hypotensive Patient developed MSSE infection of right knee prosthesis, evaluated at [place].  Discharged on long-term antibiotics for 6 weeks to end 02/25/2021 via PICC line with vancomycin and rifampin. --was initially continued on daptomycin at [facility], since later being discontinued by ID --Continue rifampin 300 mg p.o. twice daily --Outpatient follow-up with [facility], ID recommendations as noted above   Acute renal failure on CKD stage IV Baseline creatinine 1.8-2.5.   Worsening kidney function  ...  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  Appears stable and at baseline. -- Hold furosemide with poor oral intake -- Strict I's and O's and daily weights -Recent ammonia levels are stable, continue monitoring ammonia level -Rectal tube in place, diarrhea, with lactulose   Chronic venous stasis dermatitis bilateral lower extremities, chronic --Supportive care --Lower extremity elevation --Furosemide currently held   Essential hypertension-currently  hypotensive -- Hold furosemide with poor oral intake -Start IV fluid 10/11 -On Levophed   BPH: Finasteride 5 mg p.o. daily, tamsulosin 0.4 mg nightly   Chest wall cellulitis: Resolved Scrotal cellulitis: Resolved   Morbid obesity Body mass index is 64.62 kg/m.  Discussed with patient needs for aggressive lifestyle changes/weight loss as this complicates all facets of care.  Outpatient follow-up with PCP.  May benefit from bariatric evaluation outpatient. Recommend sleep study if not done already   Weakness/deconditioning/debility: From [facility].  Seen by PT/OT recommending return to SNF. --Pending insurance authorization for [place]   Ethics: Patient is steadily declining mentally and medically, worsening kidney function, remained encephalopathic hypotensive... Palliative care team consulted --family meeting today   DVT prophylaxis: SCD's Code Status: Full Family Communication: Discussed with wife and sister bedside 10/11   Status is: Inpatient   Remains inpatient appropriate because:Inpatient level of care appropriate due to severity of illness   Dispo: The patient is from: SNF              Anticipated d/c is to: SNF vs hospice with grave prognosis              Patient currently is not medically stable to d/c.              Difficult to place patient No     Consultants:  ID   Procedures:   See below  Antimicrobials:  Anti-infectives (From admission, onward)    Start     Dose/Rate Route Frequency Ordered Stop   02/19/21 2200  ceFEPIme (MAXIPIME) 2 g in sodium chloride 0.9 % 100 mL IVPB  Status:  Discontinued        2 g 200 mL/hr over 30 Minutes Intravenous Every 24 hours 02/19/21 0849 02/19/21 0907   02/11/21 2200  rifampin (RIFADIN) capsule 300 mg  Status:  Discontinued        300 mg Oral Every 12 hours 02/11/21 0925 02/19/21 0907   02/08/21 2000  ceFEPIme (MAXIPIME) 2 g in sodium chloride 0.9 % 100 mL IVPB  Status:  Discontinued        2  g 200 mL/hr over 30 Minutes Intravenous Every 12 hours 02/08/21 1302 02/19/21 0849   02/08/21 1200  piperacillin-tazobactam (ZOSYN) IVPB 3.375 g  Status:  Discontinued        3.375 g 12.5 mL/hr over 240 Minutes Intravenous Every 8 hours 02/08/21 0558 02/08/21 1302   02/08/21 0600  piperacillin-tazobactam (ZOSYN) IVPB 3.375 g        3.375 g 100 mL/hr over 30 Minutes Intravenous STAT 02/08/21 0558 02/08/21 0716   02/03/21 2000  DAPTOmycin (CUBICIN) 900 mg in sodium chloride 0.9 % IVPB  Status:  Discontinued        900 mg 136 mL/hr over 30 Minutes Intravenous Daily 02/03/21 1516 02/11/21 0925   02/02/21 1410  vancomycin variable dose per unstable renal function (pharmacist dosing)  Status:  Discontinued         Does not apply See admin instructions 02/02/21 1410 02/03/21 1516   02/02/21 1400  rifampin (RIFADIN) capsule 300 mg  Status:  Discontinued        300 mg Oral 3 times daily 02/02/21 1106 02/11/21 0925   02/02/21 1330  amoxicillin-clavulanate (AUGMENTIN) 875-125 MG per tablet 1 tablet        1 tablet Oral Every 12 hours 02/02/21 1238 02/06/21 2026   02/02/21 1000  rifampin (RIFADIN) capsule 450 mg  Status:  Discontinued        450 mg Oral Every 12 hours 02/02/21 0701 02/02/21 1106   02/02/21 0830  vancomycin (VANCOREADY) IVPB 2000 mg/400 mL        2,000 mg 200 mL/hr over 120 Minutes Intravenous  Once 02/02/21 0803 02/02/21 1547   01/31/21 0330  ceFEPIme (MAXIPIME) 2 g in sodium chloride 0.9 % 100 mL IVPB  Status:  Discontinued        2 g 200 mL/hr over 30 Minutes Intravenous Every 12 hours 01/08/2021 2214 02/02/21 0701   01/31/21 0030  linezolid (ZYVOX) tablet 600 mg  Status:  Discontinued        600 mg Oral Every 12 hours 01/16/2021 2344 02/02/21 0701   01/23/2021 2330  vancomycin (VANCOREADY) IVPB 1500 mg/300 mL  Status:  Discontinued       See Hyperspace for full Linked Orders Report.   1,500 mg 150 mL/hr over 120 Minutes Intravenous  Once 01/29/2021 2148 01/31/2021 2151   01/09/2021 2230   vancomycin (VANCOCIN) IVPB 1000 mg/200 mL premix  Status:  Discontinued       See Hyperspace for full Linked Orders Report.   1,000 mg 200 mL/hr over 60 Minutes Intravenous  Once 01/11/2021 2148 01/10/2021 2151   01/27/2021 2145  vancomycin (VANCOCIN) IVPB 1000 mg/200 mL premix  Status:  Discontinued        1,000 mg 200 mL/hr over 60 Minutes Intravenous  Once 02/02/2021 2140 01/10/2021 2145   01/17/2021 2145  ceFEPIme (MAXIPIME) 2 g in sodium chloride 0.9 % 100 mL IVPB  Status:  Discontinued        2 g 200 mL/hr over 30  Minutes Intravenous  Once 01/10/2021 2140 01/17/2021 2212   01/29/2021 1830  aztreonam (AZACTAM) 2 g in sodium chloride 0.9 % 100 mL IVPB        2 g 200 mL/hr over 30 Minutes Intravenous  Once 01/26/2021 1821 02/03/2021 2034       Objective: Vitals:   02/19/21 0614 02/19/21 0715 02/19/21 0800 02/19/21 0900  BP: (!) 118/42  (!) 115/43 (!) 106/41  Pulse: 85 85 89 86  Resp: '18 16 20 19  '$ Temp:  97.7 F (36.5 C)    TempSrc:  Axillary    SpO2: 99% 97% 98% (!) 85%  Weight:      Height:        Intake/Output Summary (Last 24 hours) at 02/19/2021 1216 Last data filed at 02/19/2021 0402 Gross per 24 hour  Intake 720.79 ml  Output 550 ml  Net 170.79 ml   Filed Weights   02/16/21 0500 02/17/21 0500 02/19/21 0403  Weight: (!) 178.1 kg (!) 178.9 kg (!) 177.2 kg      Physical Exam:   General:  Obtunded, somnolent,  HEENT:  Limited exam-normocephalic, encephalopathic  Neuro:  Limited exam-unresponsive  Lungs:   Clear to auscultation BL, Respirations unlabored, no wheezes / crackles  Cardio:    S1/S2, RRR No murmure, No Rubs or Gallops   Abdomen:   Soft, non-tender, obese clear fluid shift  Muscular skeletal:  Limited exam -encephalopathic, somnolent obtunded does not withdraw to pain stimuli now  Skin:  Dry, warm to touch, negative for any Rashes,  Wounds: Please see nursing documentation          Data Reviewed: I have personally reviewed following labs and imaging  studies  CBC: Recent Labs  Lab 02/15/21 0519 02/16/21 0429 02/16/21 1217 02/17/21 0448 02/18/21 0448 02/19/21 0439  WBC 6.5 6.1  --  9.3 8.2 6.4  HGB 8.5* 7.4* 8.1* 8.8* 7.8* 7.3*  HCT 27.9* 24.7* 27.0* 29.2* 26.3* 24.5*  MCV 94.3 95.7  --  95.1 96.7 97.6  PLT 107* 92*  --  143* 98* 68*   Basic Metabolic Panel: Recent Labs  Lab 02/13/21 0518 02/14/21 0524 02/15/21 0519 02/16/21 0429 02/17/21 0448 02/18/21 0448 02/19/21 0439  NA 139 139 139 140 142 142 142  K 3.8 4.0 3.9 3.9 4.0 4.1 4.1  CL 108 107 109 108 111 111 111  CO2 '25 26 24 25 24 24 23  '$ GLUCOSE 112* 109* 100* 84 92 120* 100*  BUN 38* 39* 41* 46* 48* 59* 62*  CREATININE 2.56* 2.51* 2.63* 2.90* 3.12* 3.82* 4.45*  CALCIUM 7.7* 7.9* 7.9* 7.6* 7.7* 8.0* 7.9*  MG 1.9 1.9  --  1.8 1.9 1.9  --    GFR: Estimated Creatinine Clearance: 25.9 mL/min (A) (by C-G formula based on SCr of 4.45 mg/dL (H)). Liver Function Tests: Recent Labs  Lab 02/14/21 0524 02/15/21 0519 02/16/21 0429 02/17/21 0448 02/18/21 0448  AST '20 22 24 26 25  '$ ALT '15 17 16 18 17  '$ ALKPHOS 106 93 72 72 72  BILITOT 1.0 1.1 1.0 1.1 1.1  PROT 7.0 7.2 6.6 7.4 7.1  ALBUMIN 1.8* 1.9* 1.8* 2.1* 2.0*   No results for input(s): LIPASE, AMYLASE in the last 168 hours. Recent Labs  Lab 02/13/21 0900 02/15/21 0818 02/16/21 0429 02/17/21 0448 02/17/21 1326  AMMONIA 21 42* 33 24 50*   Coagulation Profile: No results for input(s): INR, PROTIME in the last 168 hours. Cardiac Enzymes: No results for input(s): CKTOTAL, CKMB, CKMBINDEX, TROPONINI  in the last 168 hours. BNP (last 3 results) No results for input(s): PROBNP in the last 8760 hours. HbA1C: No results for input(s): HGBA1C in the last 72 hours. CBG: Recent Labs  Lab 02/15/21 1900 02-19-2021 1149  GLUCAP 83 95   Lipid Profile: No results for input(s): CHOL, HDL, LDLCALC, TRIG, CHOLHDL, LDLDIRECT in the last 72 hours. Thyroid Function Tests: No results for input(s): TSH, T4TOTAL, FREET4,  T3FREE, THYROIDAB in the last 72 hours. Anemia Panel: No results for input(s): VITAMINB12, FOLATE, FERRITIN, TIBC, IRON, RETICCTPCT in the last 72 hours. Sepsis Labs: No results for input(s): PROCALCITON, LATICACIDVEN in the last 168 hours.  Recent Results (from the past 240 hour(s))  Culture, blood (routine x 2)     Status: None   Collection Time: 02/11/21 10:02 AM   Specimen: Right Antecubital; Blood  Result Value Ref Range Status   Specimen Description RIGHT ANTECUBITAL  Final   Special Requests   Final    BOTTLES DRAWN AEROBIC AND ANAEROBIC Blood Culture results may not be optimal due to an excessive volume of blood received in culture bottles   Culture   Final    NO GROWTH 5 DAYS Performed at [facility], [address] [place], [place] [address]    Report Status 02/16/2021 FINAL  Final  Culture, blood (routine x 2)     Status: None   Collection Time: 02/11/21 10:02 AM   Specimen: BLOOD RIGHT ARM  Result Value Ref Range Status   Specimen Description BLOOD RIGHT ARM  Final   Special Requests   Final    BOTTLES DRAWN AEROBIC AND ANAEROBIC Blood Culture adequate volume   Culture   Final    NO GROWTH 5 DAYS Performed at [facility], [address], [place], [place] [address]    Report Status 02/16/2021 FINAL  Final  Blood culture (routine single)     Status: None   Collection Time: 02/12/21  9:59 AM   Specimen: Right Antecubital; Blood  Result Value Ref Range Status   Specimen Description RIGHT ANTECUBITAL  Final   Special Requests   Final    BOTTLES DRAWN AEROBIC AND ANAEROBIC Blood Culture adequate volume   Culture   Final    NO GROWTH 5 DAYS Performed at [facility], [address], [place], [place] [address]    Report Status 2021-02-19 FINAL  Final         Radiology Studies: EEG adult  Result Date: 19-Feb-2021 Lora Havens, MD     02/19/2021  5:05 PM Patient Name: THANOS CAPAN MRN: [id number] Epilepsy Attending: Lora Havens Referring  Physician/Provider: Dr Heath Lark Date: 19-Feb-2021 Duration: 29.23 mins Patient history: 65yo M with ams. EEG to evaluate for seizure Level of alertness: lethargic AEDs during EEG study: TPM Technical aspects: This EEG study was done with scalp electrodes positioned according to the 10-20 International system of electrode placement. Electrical activity was acquired at a sampling rate of '500Hz'$  and reviewed with a high frequency filter of '70Hz'$  and a low frequency filter of '1Hz'$ . EEG data were recorded continuously and digitally stored. Description: EEG showed continuous generalized 2-'3Hz'$  delta slowing. Generalized periodic discharges with triphasic morphology at 2-2.5 Hz were also noted. Hyperventilation and photic stimulation were not performed.   ABNORMALITY - Periodic discharges with triphasic morphology, generalized ( GPDs) - Continuous slow, generalized IMPRESSION: This study showed generalized periodic discharges with triphasic morphology at 2-2.5 Hz which is on the ictal-interictal continuum with low to intermediate potential for seizures.  This eeg pattern can also bee seen due to toxic-metabolic causes like hyperammonemia, renal failure, cefepime toxicity. Additionally there is moderate to severe diffuse encephalopathy, nonspecific etiology. No definite seizures were seen throughout the recording. Dr Manuella Ghazi was notified. Priyanka Barbra Sarks        Scheduled Meds:  glycopyrrolate  0.2 mg Intravenous TID   sodium chloride flush  10-40 mL Intracatheter Q12H   sodium chloride flush  3 mL Intravenous Q8H   Continuous Infusions:  sodium chloride Stopped (02/16/21 0006)     LOS: 20 days    Time spent: 66 minutes, critical time was spent stabilizing this patient, discussing care with ICU team and consultants.  SIGNED: Deatra James, MD, FHM. [facility],  Pager (please use [email or web address] to page/text)  Please use Epic Secure Chat for non-urgent communication (7AM-7PM) If 7PM-7AM, please contact  night-coverage [email or web address],  02/19/2021, 12:16 PM

## 2021-02-19 NOTE — Progress Notes (Signed)
Palliative:  HPI: 65 y.o. male  with past medical history of hypertension, chronic back pain, multiple falls, sleep apnea, obesity, BPH admitted from [facility] on [id number] with fever and hypoxia. Undergoing treatment with IV vancomycin for cellulitis of scrotum and legs. 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 Found to have cellulitis to chest wall and Pseudomonas bacteremia and ID recommending IV cefepime until 02/25/21. Prolonged hospitalization with complicated infection and now with declining mental status requiring BiPAP and transfer to ICU. 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 Overall prognosis is poor.   I was called to bedside where wife, Eddie Fletcher, was present. She is very tearful. Eddie Fletcher shares that she has explained to their daughter and she is now prepared to move forward with DNR and full comfort care. She recognizes that he is at end of life. We discussed plan for no more BiPAP or measures to prolong him in this state. I explained that I will make medication available to ensure he is not in any discomfort, pain, or distress. She verifies that she does not want him to suffer. Encouraged her to have any other family/friends come to bedside for support as desired. She shares that their daughter has decided that she does not want to see her father in this state and I expressed that this is okay and sometimes people aren't able to handle this - there is no right or wrong way to grieve. She had shared with me yesterday bad memories as her mother died in this same ICU previously so I offered to move him to regular room from ICU but Eddie Fletcher shares that she would rather him stay right here at this time.   All questions/concerns addressed. Emotional support provided. Updated Dr. Roger Shelter and RN Joellen Jersey. Chaplain Sayward notified to provide additional support.   Exam: Unresponsive. Breathing shallow, irregular. Generalized edema. Extremities cool to touch. Oxygen sats dropping into 80s.    Plan: - DNR, full comfort care.  - Orders changed to reflect full comfort with liberal pain medication to ensure comfort. Adjust dosage/frequency as needed to ensure comfort.  - Anticipate hospital death.   25 min  Vinie Sill, NP Palliative Medicine Team Pager [phone] (Please see [email or web address] for schedule) Team Phone [phone]    Greater than 50%  of this time was spent counseling and coordinating care related to the above assessment and plan

## 2021-02-20 DIAGNOSIS — L03313 Cellulitis of chest wall: Secondary | ICD-10-CM | POA: Diagnosis not present

## 2021-03-10 NOTE — Discharge Summary (Signed)
Death Summary  Eddie Fletcher [id number] DOB: [birth year] DOA: February 02, 2021  PCP: Curly Rim, MD  Admit date: 02-Feb-2021 Date of Death: 23-Feb-2021 Time of Death: 0857 Notification: [name], [name], MD notified of death of 02/23/2021   History of present illness:   Final/Principal Diagnoses:  1.  Pseudomonas septicemia 2. Acute toxic -metabolic encephalopathy- 3. 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   In the emergency department, patient was notably tachycardic, febrile with a temperature of 101.0 F, BP 118/39, SPO2 97% on 2 L nasal cannula.  Lab work-up notable for leukocytosis, normocytic anemia, creatinine 2.63, influenza A/B and COVID-19 PCR negative.  Chest x-ray with cardiomegaly with findings suggestive of CHF/pulmonary edema. CT chest without contrast with marked severity cellulitis along the lateral aspect of the mid and lower chest wall, marked severity splenomegaly.  Patient was started on IV Azactam, Tylenol given due to fever and started on IV fluid hydration. TRH consulted for further evaluation and management of sepsis.     -Patient has continued to have significant clinical deterioration with altered mentation as well as need for pressors.  He will require PICC line placement on 10/11.  Palliative has been consulted for further goals of care discussion as it does not appear that he may make it through this hospitalization.  CT head does demonstrate findings of  possible acute CVA to right temporal lobe, but he does not appear to be stable for MRI.  EEG ordered to evaluate for any seizure activity and is currently pending.  Neurology consulted for further evaluation.    Assessment & Plan:   Principal Problem:   Cellulitis of chest wall Active Problems:   Obstructive sleep apnea   Chronic low back pain   Morbid obesity with BMI of 60.0-69.9, adult (HCC)   Multiple falls   Elevated brain natriuretic peptide (BNP) level   Pulmonary edema   Splenomegaly, not elsewhere classified   Ascites   Chronic venous stasis dermatitis of both lower extremities   Chronic kidney disease   Acute febrile illness   Acute respiratory failure with hypoxia (HCC)   Cellulitis of scrotum   Bacteremia due to Pseudomonas   Acute dyspnea     Extensive discussion with family, palliative care team, finally wife is agreed to DNR/DNI status, pursuing with comfort care, hospice requested.     Acute metabolic encephalopathy-worsening Hypoxemia, hypercapnia Satting 85% this morning on 4 L of oxygen -Patient is progressively getting worse, obtunded, withdraws to pain stimuli only Does not follow any command Recent abg suggests hypercarbia, pt is continued on bipap as tolerated   -Cont with bipap as tolerated -If patient ever gets better and stable outpatient sleep study is recommended    -Noted to have ongoing cognitive decline with CT head now showing right temporal lobe possible CVA.   EEG ordered >>> study reporting low to intermittent potential for seizures which is also nonspecific to toxic metabolic encephalopathy hyperammonemia etc. no definitive seizures Appreciated neurology evaluation.   Pseudomonas septicemia Patient during hospitalization continued with intermittent fevers.   -Remained hypotensive on Levophed -Pt was initially continued with PICC line for outpatient antibiotics for recent right prosthetic knee infection.  PICC line has since been removed  on 02/10/2021 given concerns for line infection --Infectious disease following, appreciate assistance --Continued on cefepime 2g IV q12h, started 02/08/2021 --Repeat blood cultures x2 10/5: No growth x5 days --Cont course of abx for now for MSSE -ID recommendations as noted on note from 10/5 with plans to continue rifampin through 10/19 as well as cefepime.   Circulatory shock -PICC line placed 10/11, no further growth on blood cultures noted -was on norepinephrine -    Right knee prostatic joint infection Afebrile, hypotensive Patient developed MSSE infection of right knee prosthesis, evaluated at [facility].  Discharged on long-term antibiotics for 6 weeks to end 02/25/2021 via PICC line with vancomycin and rifampin. --was initially continued on daptomycin at [facility], since later being discontinued by ID --was on rifampin 300 mg p.o. 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   Worsening kidney function  ...  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  Appears stable and at baseline. -- Hold furosemide with poor oral intake -- Strict I's and O's and daily weights -Recent ammonia levels are stable, continue monitoring ammonia level -Rectal tube in place, diarrhea, with lactulose     Chronic venous stasis dermatitis bilateral lower extremities, chronic --Supportive care --Lower extremity elevation --Furosemide currently held   Essential hypertension-currently hypotensive -- Hold furosemide with poor oral intake -S/P IV fluid 10/11 -was on On Levophed   BPH: Finasteride 5 mg p.o. daily, tamsulosin 0.4 mg nightly   Chest wall cellulitis:  Resolved Scrotal cellulitis: Resolved   Morbid obesity Body mass index is 64.62 kg/m.  Discussed with patient needs for aggressive lifestyle changes/weight loss as this complicates all facets of care.  Outpatient follow-up with PCP.  M benefit from bariatric evaluation outpatient.    Ethics: Patient is steadily declining mentally and medically, worsening kidney function, remained encephalopathic hypotensive... Palliative care team consulted --family meeting: Agreed to DNR/DNI status, comfort care.           Disposition/Follow up Care: Patient is deceased.   Discharge medications: None  The results of significant diagnostics from this hospitalization (including imaging, microbiology, ancillary and laboratory) are listed below for reference.    Significant Diagnostic Studies: CT ABDOMEN PELVIS WO CONTRAST  Result Date: 01/31/2021 CLINICAL DATA:  Splenomegaly EXAM: CT ABDOMEN AND PELVIS WITHOUT CONTRAST TECHNIQUE: Multidetector CT imaging of the abdomen and pelvis was performed following the standard protocol without IV contrast. COMPARISON:  05/01/2004 FINDINGS: Imaging is slightly limited by respiratory motion artifact. Lower chest: No acute abnormality. Hepatobiliary: The liver is slightly small in size and demonstrates subtle contour nodularity best appreciated on coronal imaging possibly reflecting changes of underlying cirrhosis. No definite focal intrahepatic mass identified on this noncontrast examination. No intra or extrahepatic biliary ductal dilation. Cholelithiasis noted without pericholecystic inflammatory change. Pancreas: Unremarkable Spleen: There is moderate splenomegaly with the spleen measuring at least 17.9 cm in greatest dimension. No definite intrasplenic lesion identified on this noncontrast examination. Small splenule noted within the splenic hilum. Adrenals/Urinary Tract: Moderate left asymmetric cortical atrophy. Staghorn calculus extends from the left renal pelvis  into the a lower pole calices of the left kidney. There is no hydronephrosis. Right kidney is  unremarkable. No ureteral calculi. Bladder is unremarkable. Stomach/Bowel: There is mild ascites present. Duodenal malrotation noted. Long segment bowel wall thickening involving the ascending and proximal transverse colon may reflect changes of portal colopathy given additional findings. No significant pericolonic inflammatory stranding identified. No evidence of obstruction or focal inflammation. The stomach, small bowel, and large bowel are otherwise unremarkable. The appendix is not clearly identified, though this may relate to motion artifact. Vascular/Lymphatic: There is recanalization of the umbilical vein as well as a splenorenal shunt. Mild aortoiliac atherosclerotic calcification. No aortic aneurysm. No pathologic adenopathy within the abdomen and pelvis. Reproductive: Prostate is unremarkable. Other: Mild diffuse subcutaneous body wall edema. No abdominal wall hernia. Rectum unremarkable. Musculoskeletal: No acute bone abnormality. Degenerative changes are seen within the lumbar spine. IMPRESSION: Findings in keeping with cirrhosis with portal venous hypertension with portosystemic collateral formation, moderate splenomegaly, and mild ascites. This could be confirmed with hepatic elastography or tissue sampling. Left renal staghorn calculus with moderate asymmetric left renal cortical atrophy. No hydronephrosis. 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 Long segment circumferential bowel wall thickening involving the ascending and proximal transverse colon without significant pericolonic inflammatory stranding most in keeping with changes of portal colopathy. Mild infectious or inflammatory colitis, however, could appear similarly. Aortic Atherosclerosis (ICD10-I70.0). Electronically Signed   By: Fidela Salisbury M.D.   On: 01/31/2021 01:24   CT HEAD WO CONTRAST (5MM)  Result Date:  02/17/2021 CLINICAL DATA:  Mental status change EXAM: CT HEAD WITHOUT CONTRAST TECHNIQUE: Contiguous axial images were obtained from the base of the skull through the vertex without intravenous contrast. COMPARISON:  12/17/2020 FINDINGS: Brain: Hypodensity in the right anterior temporal lobe, which may be artifactual (series 3, image 11). No acute hemorrhage, hydrocephalus, extra-axial collection, mass, mass effect, or midline shift. Vascular: No hyperdense vessel or unexpected calcification. Skull: Normal. Negative for fracture or focal lesion. Sinuses/Orbits: No acute finding. Status post bilateral lens replacements. Other: Trace fluid in the mastoid air cells. IMPRESSION: Hypodensity in the right anterior temporal lobe, which may be artifactual but could indicate an acute infarct. If there is clinical concern for infarct, an MRI without contrast is recommended. These results were called by telephone at the time of interpretation on 02/17/2021 at 11:42 am to provider [facility] , who verbally acknowledged these results. Electronically Signed   By: Merilyn Baba M.D.   On: 02/17/2021 11:44   CT Chest Wo Contrast  Result Date: 01/21/2021 CLINICAL DATA:  Shortness of breath. EXAM: CT CHEST WITHOUT CONTRAST TECHNIQUE: Multidetector CT imaging of the chest was performed following the standard protocol without IV contrast. COMPARISON:  None. FINDINGS: Cardiovascular: A left-sided venous catheter is in place. No significant vascular findings. Normal heart size. No pericardial effusion. Mediastinum/Nodes: No enlarged mediastinal or axillary lymph nodes. Thyroid gland, trachea, and esophagus demonstrate no significant findings. Lungs/Pleura: Mild linear scarring and/or atelectasis is seen within the posteromedial aspect of the left upper lobe and lateral aspect of the left lung base. There is no evidence of acute infiltrate, pleural effusion or pneumothorax. Upper Abdomen: Subcentimeter gallstones are seen within the  lumen of a moderately distended gallbladder. There is moderate to marked severity splenomegaly. A mild amount of ascites is present. Musculoskeletal: Marked severity subcutaneous and para muscular inflammatory fat stranding is seen along the lateral aspect of the mid to lower left chest wall. This extends along the visualized portion of the lateral left abdominal wall. Degenerative changes are seen throughout the thoracic spine. Other: It should be noted that the  study is limited secondary to patient motion. IMPRESSION: 1. Marked severity cellulitis along the lateral aspect of the mid and lower left chest wall. 2. Moderate to marked severity splenomegaly. 3. Cholelithiasis. 4. Mild amount of ascites. Electronically Signed   By: Virgina Norfolk M.D.   On: 01/25/2021 19:24   NM Pulmonary Perfusion  Result Date: 02/08/2021 CLINICAL DATA:  Suspected pulmonary embolus. Low-intermediate probability. EXAM: NUCLEAR MEDICINE PERFUSION LUNG SCAN TECHNIQUE: Perfusion images were obtained in multiple projections after intravenous injection of radiopharmaceutical. Ventilation scans intentionally deferred if perfusion scan and chest x-ray adequate for interpretation during COVID 19 epidemic. RADIOPHARMACEUTICALS:  4.3 mCi Tc-74mMAA IV COMPARISON:  Chest x-ray on 02/08/2021 FINDINGS: There is normal perfusion. No pleural based wedge-shaped defects to indicate presence of acute pulmonary embolus. IMPRESSION: Normal perfusion. Electronically Signed   By: ENolon NationsM.D.   On: 02/08/2021 15:16   UKoreaVenous Img Lower Bilateral (DVT)  Result Date: 02/09/2021 CLINICAL DATA:  65year old male with bilateral lower extremity edema, dyspnea, and fever. EXAM: BILATERAL LOWER EXTREMITY VENOUS DOPPLER ULTRASOUND TECHNIQUE: Gray-scale sonography with graded compression, as well as color Doppler and duplex ultrasound were performed to evaluate the lower extremity deep venous systems from the level of the common femoral vein and  including the common femoral, femoral, profunda femoral, popliteal and calf veins including the posterior tibial, peroneal and gastrocnemius veins when visible. The superficial great saphenous vein was also interrogated. Spectral Doppler was utilized to evaluate flow at rest and with distal augmentation maneuvers in the common femoral, femoral and popliteal veins. COMPARISON:  Right lower extremity venous duplex from 09/23/2004 FINDINGS: RIGHT LOWER EXTREMITY Common Femoral Vein: No evidence of thrombus. Normal compressibility, respiratory phasicity and response to augmentation. Saphenofemoral Junction: No evidence of thrombus. Normal compressibility and flow on color Doppler imaging. Profunda Femoral Vein: No evidence of thrombus. Normal compressibility and flow on color Doppler imaging. Femoral Vein: No evidence of thrombus. Normal compressibility, respiratory phasicity and response to augmentation. Popliteal Vein: No evidence of thrombus. Normal compressibility, respiratory phasicity and response to augmentation. Calf Veins: Limited evaluation. No evidence of thrombus. Normal compressibility and flow on color Doppler imaging. Superficial Great Saphenous Vein: No evidence of thrombus. Normal compressibility. Other Findings:  Subcutaneous edema is noted. LEFT LOWER EXTREMITY Common Femoral Vein: Limited evaluation. No evidence of thrombus. Normal compressibility, respiratory phasicity and response to augmentation. Saphenofemoral Junction: Limited evaluation. No evidence of thrombus. Normal compressibility and flow on color Doppler imaging. Profunda Femoral Vein: Limited evaluation. No evidence of thrombus. Normal compressibility and flow on color Doppler imaging. Femoral Vein: No evidence of thrombus. Normal compressibility, respiratory phasicity and response to augmentation. Popliteal Vein: No evidence of thrombus. Normal compressibility, respiratory phasicity and response to augmentation. Calf Veins: Limited  evaluation. No evidence of thrombus. Normal compressibility and flow on color Doppler imaging. Other Findings:  Subcutaneous edema is noted. IMPRESSION: No evidence of bilateral lower extremity deep vein thrombosis. Portions of the examination are limited due to subcutaneous edema and body habitus. DRuthann Cancer MD [facility]CRadiology Electronically Signed   By: DRuthann CancerM.D.   On: 02/09/2021 11:27   DG CHEST PORT 1 VIEW  Result Date: 02/15/2021 CLINICAL DATA:  Acute hypoxemic respiratory failure EXAM: PORTABLE CHEST 1 VIEW COMPARISON:  02/14/2021 FINDINGS: Cardiomegaly with vascular congestion. Diffuse interstitial opacities compatible with edema. No effusions or acute bony abnormality. IMPRESSION: Cardiomegaly, vascular congestion, mild interstitial edema. Electronically Signed   By: KRolm BaptiseM.D.   On:  02/15/2021 17:09   DG CHEST PORT 1 VIEW  Result Date: 02/14/2021 CLINICAL DATA:  65 year old male with history of shortness of breath and fever. Cellulitis of the chest wall. EXAM: PORTABLE CHEST 1 VIEW COMPARISON:  Chest x-ray 02/13/2021. FINDINGS: Lung volumes are normal. Severe diffuse peribronchial cuffing and widespread interstitial prominence. Opacity obscuring the lateral aspect of the left hemidiaphragm. No pneumothorax. No definite right pleural effusion. Cephalization of the pulmonary vasculature. Mild cardiomegaly. The patient is rotated to the left on today's exam, resulting in distortion of the mediastinal contours and reduced diagnostic sensitivity and specificity for mediastinal pathology. IMPRESSION: 1. The appearance the chest could simply reflect congestive heart failure, although the possibility of underlying bronchitis is also suspected. 2. Obscuration of the lateral left hemidiaphragm which may reflect a focal area of atelectasis and/or consolidation, potentially with small left pleural effusion. Attention on follow-up is  recommended. Electronically Signed   By: Vinnie Langton M.D.   On: 02/14/2021 08:17   DG CHEST PORT 1 VIEW  Result Date: 02/13/2021 CLINICAL DATA:  Shortness of breath.  Hypertension.  Sleep apnea. EXAM: PORTABLE CHEST 1 VIEW COMPARISON:  02/08/2021 and 12/17/2020 FINDINGS: Mild cardiomegaly stable. Pulmonary vascular congestion is unchanged. No evidence of acute infiltrate or pleural effusion. IMPRESSION: Stable cardiomegaly and pulmonary vascular congestion. No acute findings. Electronically Signed   By: Marlaine Hind M.D.   On: 02/13/2021 13:18   DG CHEST PORT 1 VIEW  Result Date: 02/08/2021 CLINICAL DATA:  Patient admitted for cellulitis to chest wall. EXAM: PORTABLE CHEST 1 VIEW COMPARISON:  February 06, 2021 FINDINGS: Stable cardiomegaly. The hila and mediastinum are unchanged. No pneumothorax. No nodules or masses. Mild interstitial prominence. IMPRESSION: Mild interstitial prominence suggests pulmonary venous congestion/mild pulmonary edema. No other abnormalities or changes. Stable left PICC line. Electronically Signed   By: Dorise Bullion III M.D.   On: 02/08/2021 10:52   DG CHEST PORT 1 VIEW  Result Date: 02/06/2021 CLINICAL DATA:  Fever, productive cough EXAM: PORTABLE CHEST 1 VIEW COMPARISON:  Portable exam 1214 hours compared to 01/12/2021 FINDINGS: LEFT arm PICC line tip projects over SVC. Enlargement of cardiac silhouette with pulmonary vascular congestion. Improved pulmonary edema since previous exam. No definite segmental consolidation, pleural effusion, or pneumothorax. Bones demineralized. IMPRESSION: Enlargement of cardiac silhouette with pulmonary vascular congestion. Improved pulmonary edema. No definite acute abnormalities. Electronically Signed   By: Lavonia Dana M.D.   On: 02/06/2021 12:44   DG Chest Port 1 View  Result Date: 01/08/2021 CLINICAL DATA:  Shortness of breath EXAM: PORTABLE CHEST 1 VIEW COMPARISON:  12/17/2020 FINDINGS: Cardiomegaly. Pulmonary vascular  congestion. Diffuse interstitial prominence. Left lung base is largely obscured. A left-sided pleural effusion is not excluded. No pneumothorax. A left-sided PICC line terminates at the level of the SVC. IMPRESSION: Cardiomegaly with findings suggestive of CHF with pulmonary edema. A superimposed infectious process would be difficult to exclude. Electronically Signed   By: Davina Poke D.O.   On: 01/26/2021 17:29   EEG adult  Result Date: 02/17/2021 Lora Havens, MD     02/17/2021  5:05 PM Patient Name: Eddie Fletcher MRN: [id number] Epilepsy Attending: Lora Havens Referring Physician/Provider: Dr Heath Lark Date: 02/17/2021 Duration: 29.23 mins Patient history: 65yo M with ams. EEG to evaluate for seizure Level of alertness: lethargic AEDs during EEG study: TPM Technical aspects: This EEG study was done with scalp electrodes positioned according to the 10-20 International system of electrode placement. Electrical activity was acquired at a sampling rate  of '500Hz'$  and reviewed with a high frequency filter of '70Hz'$  and a low frequency filter of '1Hz'$ . EEG data were recorded continuously and digitally stored. Description: EEG showed continuous generalized 2-'3Hz'$  delta slowing. Generalized periodic discharges with triphasic morphology at 2-2.5 Hz were also noted. Hyperventilation and photic stimulation were not performed.   ABNORMALITY - Periodic discharges with triphasic morphology, generalized ( GPDs) - Continuous slow, generalized IMPRESSION: This study showed generalized periodic discharges with triphasic morphology at 2-2.5 Hz which is on the ictal-interictal continuum with low to intermediate potential for seizures. This eeg pattern can also bee seen due to toxic-metabolic causes like hyperammonemia, renal failure, cefepime toxicity. Additionally there is moderate to severe diffuse encephalopathy, nonspecific etiology. No definite seizures were seen throughout the recording. Dr Manuella Ghazi was notified.  Lora Havens   ECHOCARDIOGRAM COMPLETE  Result Date: 01/31/2021    ECHOCARDIOGRAM REPORT   Patient Name:   Eddie Fletcher [facility] Date of Exam: 01/31/2021 Medical Rec #:  [id number]        Height:       66.0 in Accession #:    [id number]       Weight:       388.4 lb Date of Birth:  Sep 10, 1955        BSA:          2.652 m Patient Age:    23 years         BP:           96/47 mmHg Patient Gender: M                HR:           88 bpm. Exam Location:  [facility] Procedure: 2D Echo, Cardiac Doppler, Color Doppler and Intracardiac            Opacification Agent Indications:    CHF  History:        Patient has prior history of Echocardiogram examinations, most                 recent 01/09/2015. Cardiomegaly, Signs/Symptoms:Shortness of                 Breath, Fever and Pul. edema; Risk Factors:Hypertension, Sleep                 Apnea and Morbid obesity.  Sonographer:    Dustin Flock RDCS Referring Phys: [id number] OLADAPO ADEFESO IMPRESSIONS  1. Left ventricular ejection fraction, by estimation, is 65 to 70%. The left ventricle has normal function. The left ventricle has no regional wall motion abnormalities. There is mild left ventricular hypertrophy. Left ventricular diastolic parameters are indeterminate.  2. Right ventricular systolic function was not well visualized. The right ventricular size is not well visualized. Tricuspid regurgitation signal is inadequate for assessing PA pressure.  3. The mitral valve was not well visualized. No evidence of mitral valve regurgitation. No evidence of mitral stenosis.  4. The aortic valve has an indeterminant number of cusps. Aortic valve regurgitation is not visualized. No aortic stenosis is present. FINDINGS  Left Ventricle: Left ventricular ejection fraction, by estimation, is 65 to 70%. The left ventricle has normal function. The left ventricle has no regional wall motion abnormalities. Definity contrast agent was given IV to delineate the left ventricular  endocardial  borders. The left ventricular internal cavity size was normal in size. There is mild left ventricular hypertrophy. Left ventricular diastolic parameters are indeterminate. Right Ventricle: RV poorly visualized. Grossly may be mildly  enlarged, function appears likely normal. The right ventricular size is not well visualized. Right vetricular wall thickness was not well visualized. Right ventricular systolic function was not  well visualized. Tricuspid regurgitation signal is inadequate for assessing PA pressure. Left Atrium: Left atrial size was normal in size. Right Atrium: Right atrial size was not well visualized. Pericardium: There is no evidence of pericardial effusion. Mitral Valve: The mitral valve was not well visualized. No evidence of mitral valve regurgitation. No evidence of mitral valve stenosis. Tricuspid Valve: The tricuspid valve is not well visualized. Tricuspid valve regurgitation is trivial. No evidence of tricuspid stenosis. Aortic Valve: The aortic valve has an indeterminant number of cusps. Aortic valve regurgitation is not visualized. No aortic stenosis is present. Aortic valve mean gradient measures 7.0 mmHg. Aortic valve peak gradient measures 14.7 mmHg. Aortic valve area, by VTI measures 3.97 cm. Pulmonic Valve: The pulmonic valve was not well visualized. Pulmonic valve regurgitation is trivial. No evidence of pulmonic stenosis. Aorta: The aortic root is normal in size and structure. IAS/Shunts: The interatrial septum was not well visualized.  LEFT VENTRICLE PLAX 2D LVIDd:         4.20 cm  Diastology LVIDs:         2.70 cm  LV e' medial:    7.94 cm/s LV PW:         1.20 cm  LV E/e' medial:  16.4 LV IVS:        1.10 cm  LV e' lateral:   7.07 cm/s LVOT diam:     2.10 cm  LV E/e' lateral: 18.4 LV SV:         106 LV SV Index:   40 LVOT Area:     3.46 cm  RIGHT VENTRICLE RV Basal diam:  3.90 cm RV S prime:     14.70 cm/s TAPSE (M-mode): 2.5 cm LEFT ATRIUM             Index       RIGHT ATRIUM            Index LA diam:        4.20 cm 1.58 cm/m  RA Area:     13.20 cm LA Vol (A2C):   44.7 ml 16.86 ml/m RA Volume:   26.80 ml  10.11 ml/m LA Vol (A4C):   41.2 ml 15.54 ml/m LA Biplane Vol: 44.6 ml 16.82 ml/m  AORTIC VALVE AV Area (Vmax):    2.72 cm AV Area (Vmean):   3.08 cm AV Area (VTI):     3.97 cm AV Vmax:           192.00 cm/s AV Vmean:          116.000 cm/s AV VTI:            0.267 m AV Peak Grad:      14.7 mmHg AV Mean Grad:      7.0 mmHg LVOT Vmax:         151.00 cm/s LVOT Vmean:        103.000 cm/s LVOT VTI:          0.306 m LVOT/AV VTI ratio: 1.15  AORTA Ao Root diam: 2.80 cm MITRAL VALVE MV Area (PHT): 4.52 cm     SHUNTS MV Decel Time: 168 msec     Systemic VTI:  0.31 m MV E velocity: 130.00 cm/s  Systemic Diam: 2.10 cm MV A velocity: 114.00 cm/s MV E/A ratio:  1.14 Carlyle Dolly MD Electronically signed by  Carlyle Dolly MD Signature Date/Time: 01/31/2021/1:28:51 PM    Final    [place] EKG SITE RITE  Result Date: 02/17/2021 If Site Rite image not attached, placement could not be confirmed due to current cardiac rhythm.   Microbiology: Recent Results (from the past 240 hour(s))  Culture, blood (routine x 2)     Status: None   Collection Time: 02/11/21 10:02 AM   Specimen: Right Antecubital; Blood  Result Value Ref Range Status   Specimen Description RIGHT ANTECUBITAL  Final   Special Requests   Final    BOTTLES DRAWN AEROBIC AND ANAEROBIC Blood Culture results may not be optimal due to an excessive volume of blood received in culture bottles   Culture   Final    NO GROWTH 5 DAYS Performed at [facility], [address] [place], [place] [address]    Report Status 02/16/2021 FINAL  Final  Culture, blood (routine x 2)     Status: None   Collection Time: 02/11/21 10:02 AM   Specimen: BLOOD RIGHT ARM  Result Value Ref Range Status   Specimen Description BLOOD RIGHT ARM  Final   Special Requests   Final    BOTTLES DRAWN AEROBIC AND ANAEROBIC Blood Culture adequate volume    Culture   Final    NO GROWTH 5 DAYS Performed at [facility], [address] [place], [place] [address]    Report Status 02/16/2021 FINAL  Final  Blood culture (routine single)     Status: None   Collection Time: 02/12/21  9:59 AM   Specimen: Right Antecubital; Blood  Result Value Ref Range Status   Specimen Description RIGHT ANTECUBITAL  Final   Special Requests   Final    BOTTLES DRAWN AEROBIC AND ANAEROBIC Blood Culture adequate volume   Culture   Final    NO GROWTH 5 DAYS Performed at [facility], [address] [place], [place] [address]    Report Status 02/17/2021 FINAL  Final     Labs: Basic Metabolic Panel: Recent Labs  Lab 02/14/21 0524 02/15/21 0519 02/16/21 0429 02/17/21 0448 02/18/21 0448 02/19/21 0439  NA 139 139 140 142 142 142  K 4.0 3.9 3.9 4.0 4.1 4.1  CL 107 109 108 111 111 111  CO2 '26 24 25 24 24 23  '$ GLUCOSE 109* 100* 84 92 120* 100*  BUN 39* 41* 46* 48* 59* 62*  CREATININE 2.51* 2.63* 2.90* 3.12* 3.82* 4.45*  CALCIUM 7.9* 7.9* 7.6* 7.7* 8.0* 7.9*  MG 1.9  --  1.8 1.9 1.9  --    Liver Function Tests: Recent Labs  Lab 02/14/21 0524 02/15/21 0519 02/16/21 0429 02/17/21 0448 02/18/21 0448  AST '20 22 24 26 25  '$ ALT '15 17 16 18 17  '$ ALKPHOS 106 93 72 72 72  BILITOT 1.0 1.1 1.0 1.1 1.1  PROT 7.0 7.2 6.6 7.4 7.1  ALBUMIN 1.8* 1.9* 1.8* 2.1* 2.0*   No results for input(s): LIPASE, AMYLASE in the last 168 hours. Recent Labs  Lab 02/15/21 0818 02/16/21 0429 02/17/21 0448 02/17/21 1326  AMMONIA 42* 33 24 50*   CBC: Recent Labs  Lab 02/15/21 0519 02/16/21 0429 02/16/21 1217 02/17/21 0448 02/18/21 0448 02/19/21 0439  WBC 6.5 6.1  --  9.3 8.2 6.4  HGB 8.5* 7.4* 8.1* 8.8* 7.8* 7.3*  HCT 27.9* 24.7* 27.0* 29.2* 26.3* 24.5*  MCV 94.3 95.7  --  95.1 96.7 97.6  PLT 107* 92*  --  143* 98* 68*   Cardiac Enzymes: No results  for input(s): CKTOTAL, CKMB, CKMBINDEX, TROPONINI in the last 168 hours. D-Dimer No results for input(s): DDIMER in  the last 72 hours. BNP: Invalid input(s): POCBNP CBG: Recent Labs  Lab 02/15/21 1900 02/17/21 1149  GLUCAP 83 95   Anemia work up No results for input(s): VITAMINB12, FOLATE, FERRITIN, TIBC, IRON, RETICCTPCT in the last 72 hours. Urinalysis    Component Value Date/Time   COLORURINE YELLOW 02/06/2021 1506   APPEARANCEUR CLEAR 02/06/2021 1506   LABSPEC 1.010 02/06/2021 1506   PHURINE 5.0 02/06/2021 1506   GLUCOSEU NEGATIVE 02/06/2021 1506   HGBUR SMALL (A) 02/06/2021 1506   BILIRUBINUR NEGATIVE 02/06/2021 1506   KETONESUR NEGATIVE 02/06/2021 1506   PROTEINUR 30 (A) 02/06/2021 1506   UROBILINOGEN 0.2 01/08/2015 2044   NITRITE NEGATIVE 02/06/2021 1506   LEUKOCYTESUR NEGATIVE 02/06/2021 1506   Sepsis Labs Invalid input(s): PROCALCITONIN,  WBC,  LACTICIDVEN  I have spent 35 minutes face to face encounter with the patient and on the ward discussing the patients care, assessment, plan and disposition with other care givers. >50% of the time was devoted  coordinating care.    SIGNED:  Deatra James, MD  [facility] 2021/02/24, 10:00 AM Pager   If 7PM-7AM, please contact night-coverage [email or web address] Password [email or web address]

## 2021-03-10 NOTE — Progress Notes (Signed)
This RN and Sherry Ruffing, RN verified Time of Death at [phone]. Dr. Roger Shelter, Palliative care, and Spiritual care notified. Spouse, Elmo Putt, notified via telephone.

## 2021-03-10 DEATH — deceased

## 2023-05-02 IMAGING — CT CT HEAD W/O CM
4 series · 16 of 47 positions shown, 18 images · non-contrast
Comparison: 12/17/2020

CLINICAL DATA: Mental status change

EXAM:
CT HEAD WITHOUT CONTRAST
TECHNIQUE: Contiguous axial images were obtained from the base of the skull
through the vertex without intravenous contrast.

[Series 2: head bone · axial · 0.43mm/px · z∈[+20,+52]mm · 3 of 83 slices shown]
[im 9/83  bone]
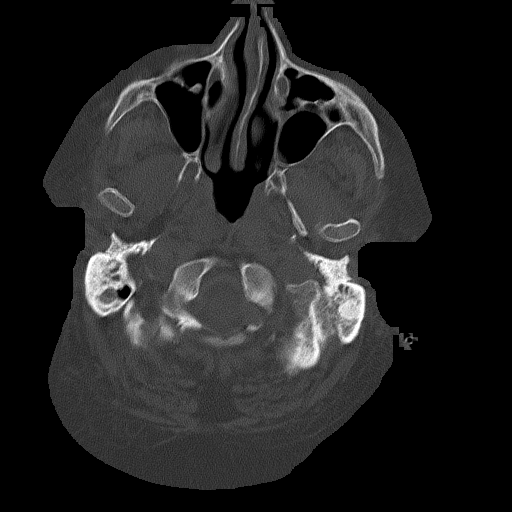
[im 17/83  bone]
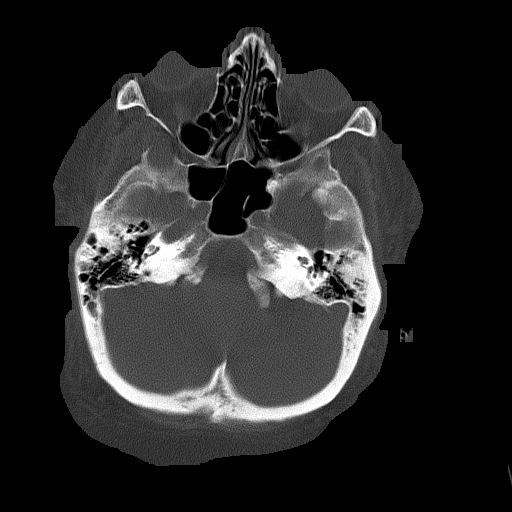
[im 25/83  bone]
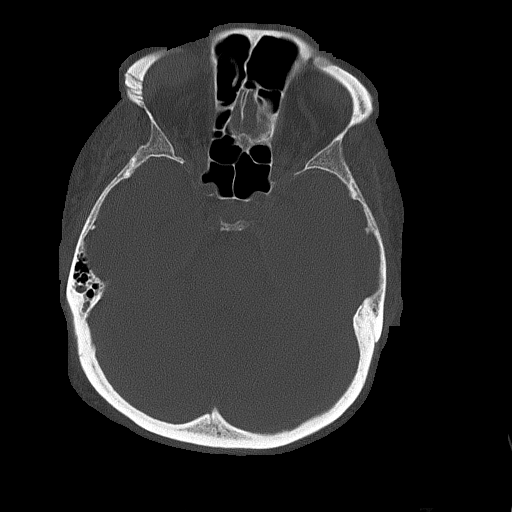

[Series 3: head w o · axial · 0.43mm/px · z∈[+24,+144]mm · 7 of 33 slices shown, 9 images]
[im 5/33  brain]
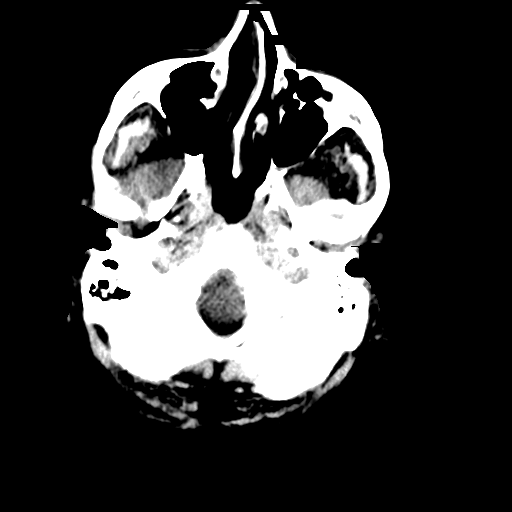
[im 5/33  bone]
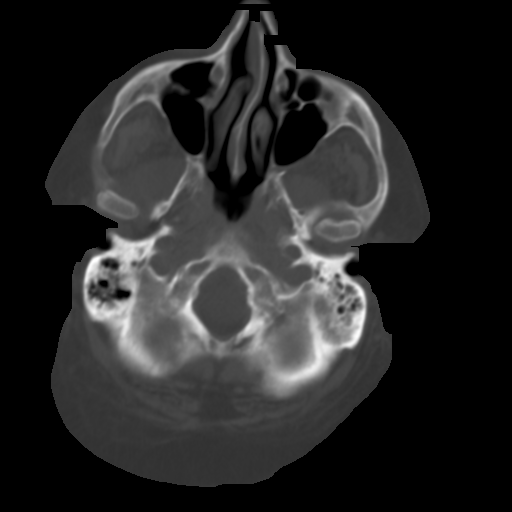
[im 9/33  brain]
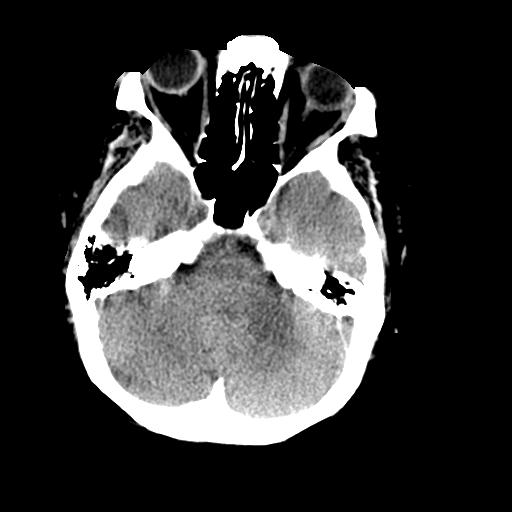
[im 13/33  brain]
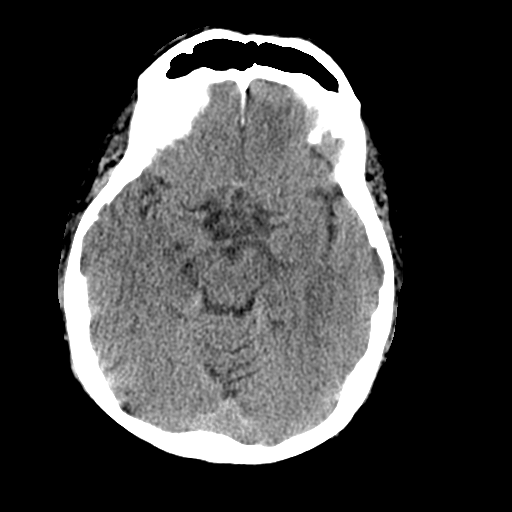
[im 17/33  brain]
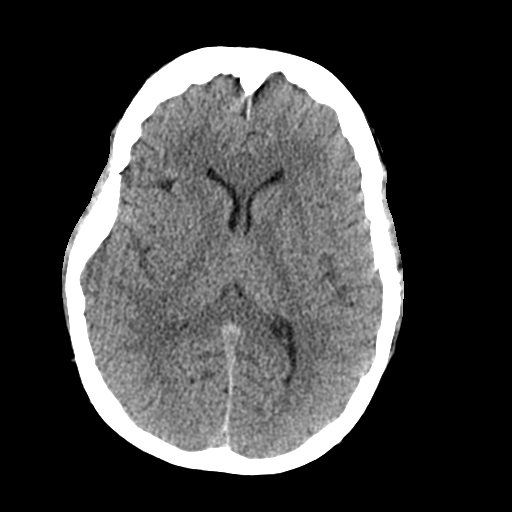
[im 21/33  brain]
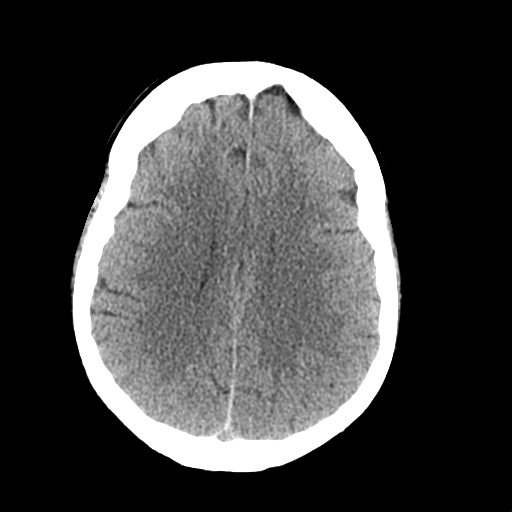
[im 21/33  bone]
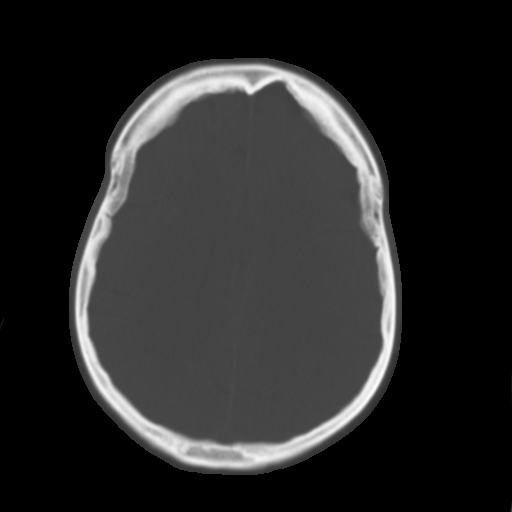
[im 25/33  brain]
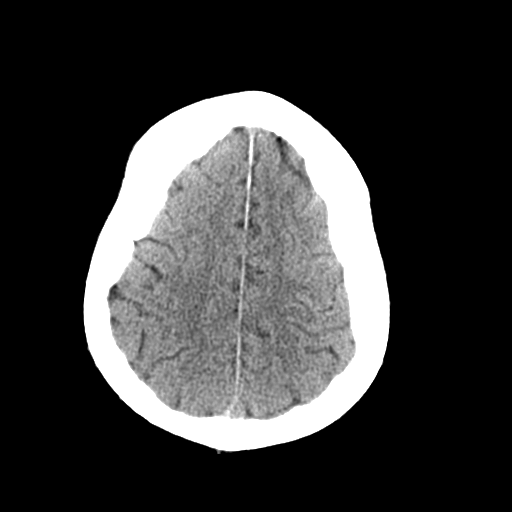
[im 29/33  brain]
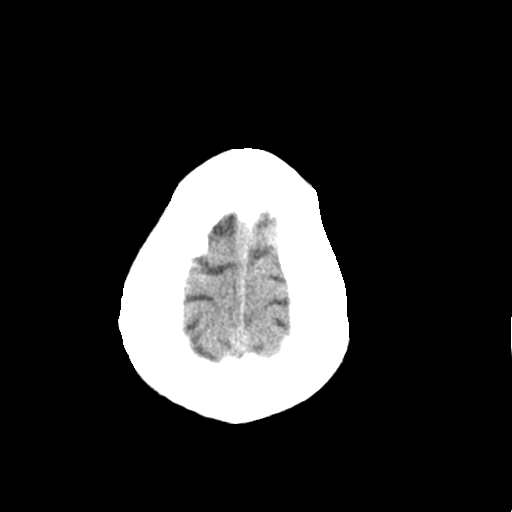

[Series 4: coronal soft · coronal · 0.35mm/px · 3 of 84 slices shown]
[im 28/84  brain]
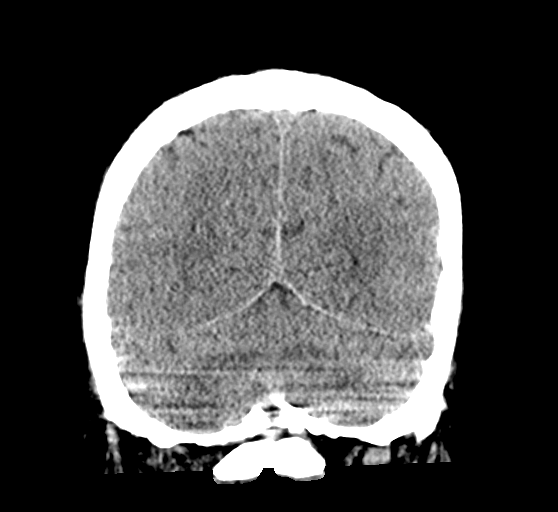
[im 37/84  brain]
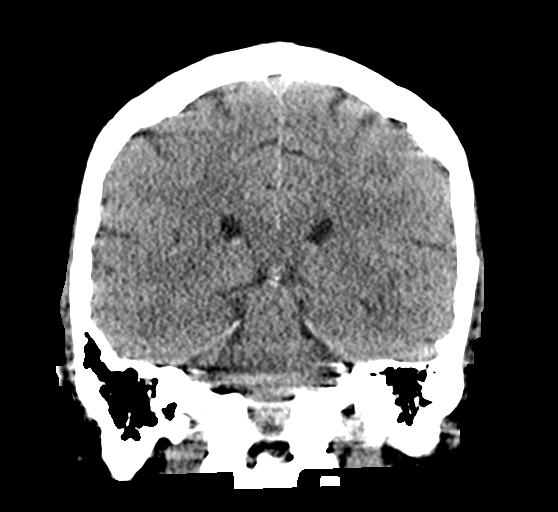
[im 47/84  brain]
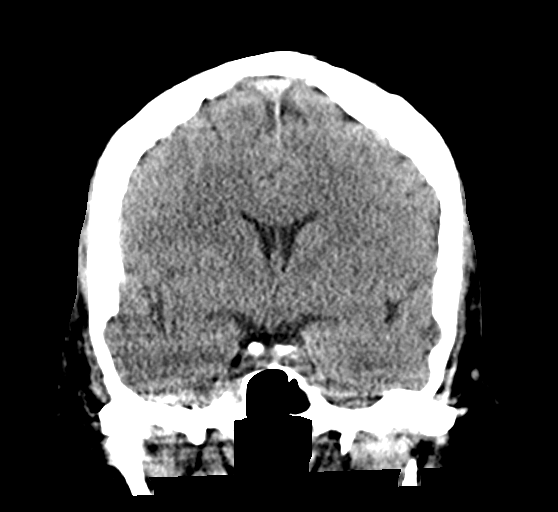

[Series 5: sagittal soft · sagittal · 0.37mm/px · 3 of 66 slices shown]
[im 22/66  brain]
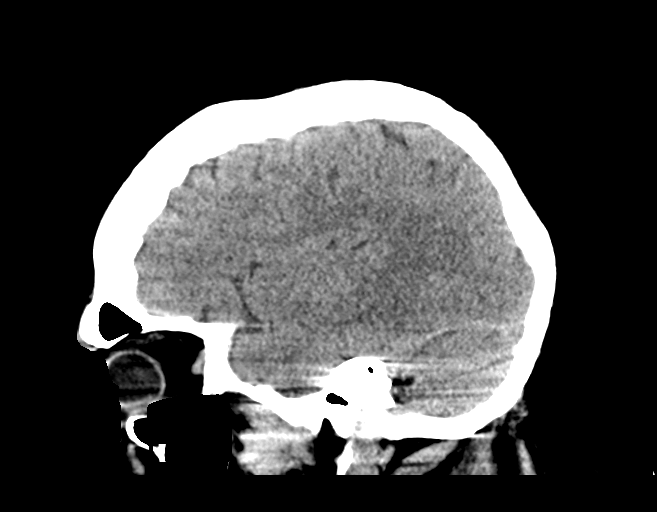
[im 33/66  brain]
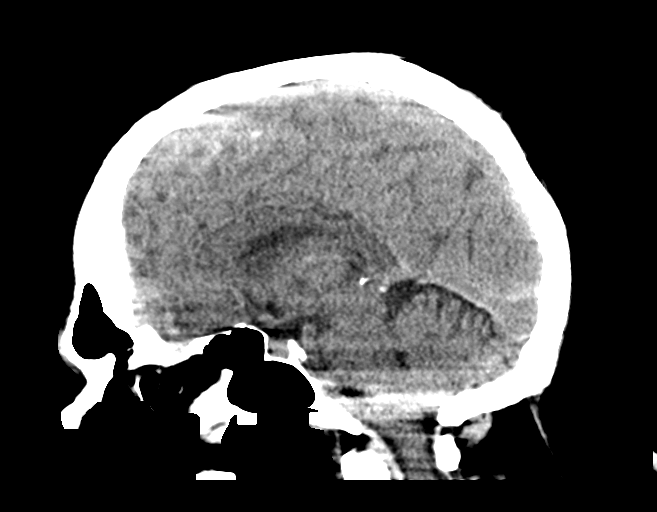
[im 44/66  brain]
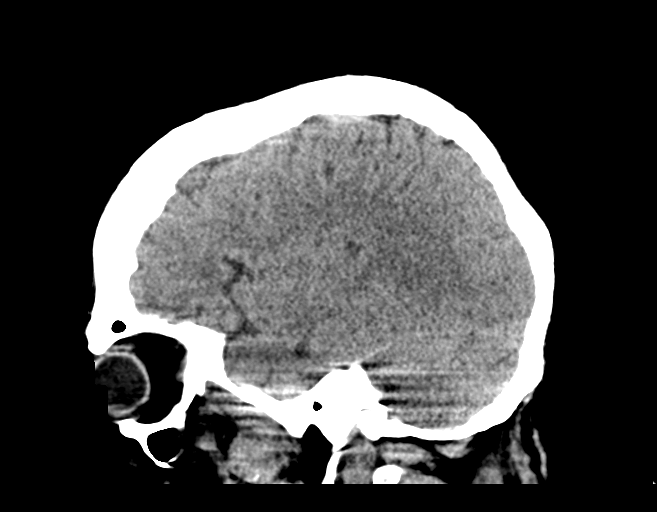

[16 of 47 positions shown; findings below may reference images not displayed]

FINDINGS: Brain: Hypodensity in the right anterior temporal lobe, which may be
artifactual (series 3, image 11). No acute hemorrhage,
hydrocephalus, extra-axial collection, mass, mass effect, or midline
shift.

Vascular: No hyperdense vessel or unexpected calcification.

Skull: Normal. Negative for fracture or focal lesion.

Sinuses/Orbits: No acute finding. Status post bilateral lens
replacements.

Other: Trace fluid in the mastoid air cells.
IMPRESSION: Hypodensity in the right anterior temporal lobe, which may be
artifactual but could indicate an acute infarct. If there is
clinical concern for infarct, an MRI without contrast is
recommended.

These results were called by telephone at the time of interpretation
on 02/17/2021 at [DATE] to provider J FELIPE DOSIL , who verbally
acknowledged these results.
